# Patient Record
Sex: Male | Born: 1935 | Race: Black or African American | Hispanic: No | Marital: Single | Smoking: Former smoker
Health system: Southern US, Community
[De-identification: ages and names within clinical notes are randomized; demographics above are authoritative.]

## PROBLEM LIST (undated history)

## (undated) DIAGNOSIS — R296 Repeated falls: Secondary | ICD-10-CM

## (undated) DIAGNOSIS — R001 Bradycardia, unspecified: Secondary | ICD-10-CM

## (undated) DIAGNOSIS — R011 Cardiac murmur, unspecified: Secondary | ICD-10-CM

## (undated) DIAGNOSIS — K219 Gastro-esophageal reflux disease without esophagitis: Secondary | ICD-10-CM

## (undated) DIAGNOSIS — I509 Heart failure, unspecified: Secondary | ICD-10-CM

## (undated) DIAGNOSIS — H409 Unspecified glaucoma: Secondary | ICD-10-CM

## (undated) DIAGNOSIS — F32A Depression, unspecified: Secondary | ICD-10-CM

## (undated) DIAGNOSIS — G20 Parkinson's disease: Secondary | ICD-10-CM

## (undated) DIAGNOSIS — G5793 Unspecified mononeuropathy of bilateral lower limbs: Secondary | ICD-10-CM

## (undated) DIAGNOSIS — N320 Bladder-neck obstruction: Secondary | ICD-10-CM

## (undated) DIAGNOSIS — I2510 Atherosclerotic heart disease of native coronary artery without angina pectoris: Secondary | ICD-10-CM

## (undated) DIAGNOSIS — I429 Cardiomyopathy, unspecified: Secondary | ICD-10-CM

## (undated) DIAGNOSIS — I700 Atherosclerosis of aorta: Secondary | ICD-10-CM

## (undated) DIAGNOSIS — M1990 Unspecified osteoarthritis, unspecified site: Secondary | ICD-10-CM

## (undated) DIAGNOSIS — E785 Hyperlipidemia, unspecified: Secondary | ICD-10-CM

## (undated) DIAGNOSIS — I714 Abdominal aortic aneurysm, without rupture, unspecified: Secondary | ICD-10-CM

## (undated) DIAGNOSIS — I4891 Unspecified atrial fibrillation: Secondary | ICD-10-CM

## (undated) DIAGNOSIS — I10 Essential (primary) hypertension: Secondary | ICD-10-CM

## (undated) DIAGNOSIS — N400 Enlarged prostate without lower urinary tract symptoms: Secondary | ICD-10-CM

## (undated) DIAGNOSIS — I219 Acute myocardial infarction, unspecified: Secondary | ICD-10-CM

## (undated) DIAGNOSIS — I723 Aneurysm of iliac artery: Secondary | ICD-10-CM

## (undated) DIAGNOSIS — G20A1 Parkinson's disease without dyskinesia, without mention of fluctuations: Secondary | ICD-10-CM

## (undated) DIAGNOSIS — G629 Polyneuropathy, unspecified: Secondary | ICD-10-CM

## (undated) DIAGNOSIS — K861 Other chronic pancreatitis: Secondary | ICD-10-CM

## (undated) DIAGNOSIS — G2581 Restless legs syndrome: Secondary | ICD-10-CM

## (undated) DIAGNOSIS — Z7901 Long term (current) use of anticoagulants: Secondary | ICD-10-CM

## (undated) DIAGNOSIS — Z972 Presence of dental prosthetic device (complete) (partial): Secondary | ICD-10-CM

## (undated) HISTORY — PX: CORONARY ANGIOPLASTY: SHX604

## (undated) HISTORY — PX: OTHER SURGICAL HISTORY: SHX169

---

## 2003-11-16 HISTORY — PX: CORONARY ANGIOPLASTY WITH STENT PLACEMENT: SHX49

## 2003-11-19 ENCOUNTER — Other Ambulatory Visit: Payer: Self-pay

## 2003-11-19 DIAGNOSIS — I2119 ST elevation (STEMI) myocardial infarction involving other coronary artery of inferior wall: Secondary | ICD-10-CM

## 2003-11-19 HISTORY — DX: ST elevation (STEMI) myocardial infarction involving other coronary artery of inferior wall: I21.19

## 2003-11-20 ENCOUNTER — Other Ambulatory Visit: Payer: Self-pay

## 2004-02-12 ENCOUNTER — Encounter: Payer: Self-pay | Admitting: Internal Medicine

## 2004-07-26 ENCOUNTER — Ambulatory Visit: Payer: Self-pay | Admitting: Gastroenterology

## 2004-09-08 ENCOUNTER — Ambulatory Visit: Payer: Self-pay | Admitting: Gastroenterology

## 2004-11-23 ENCOUNTER — Emergency Department: Payer: Self-pay | Admitting: Emergency Medicine

## 2004-11-23 ENCOUNTER — Other Ambulatory Visit: Payer: Self-pay

## 2004-12-13 ENCOUNTER — Ambulatory Visit: Payer: Self-pay | Admitting: Nurse Practitioner

## 2005-03-13 ENCOUNTER — Ambulatory Visit: Payer: Self-pay | Admitting: Unknown Physician Specialty

## 2007-05-02 DIAGNOSIS — I219 Acute myocardial infarction, unspecified: Secondary | ICD-10-CM

## 2007-05-02 HISTORY — DX: Acute myocardial infarction, unspecified: I21.9

## 2007-05-02 HISTORY — PX: CORONARY ANGIOPLASTY WITH STENT PLACEMENT: SHX49

## 2007-05-21 ENCOUNTER — Encounter: Payer: Self-pay | Admitting: Internal Medicine

## 2007-06-15 ENCOUNTER — Encounter: Payer: Self-pay | Admitting: Internal Medicine

## 2007-07-13 ENCOUNTER — Encounter: Payer: Self-pay | Admitting: Internal Medicine

## 2007-07-28 ENCOUNTER — Ambulatory Visit: Payer: Self-pay | Admitting: Family Medicine

## 2007-08-13 ENCOUNTER — Encounter: Payer: Self-pay | Admitting: Internal Medicine

## 2008-07-30 ENCOUNTER — Emergency Department: Payer: Self-pay | Admitting: Emergency Medicine

## 2008-08-12 ENCOUNTER — Ambulatory Visit: Payer: Self-pay | Admitting: Unknown Physician Specialty

## 2009-02-01 ENCOUNTER — Inpatient Hospital Stay: Payer: Self-pay | Admitting: Specialist

## 2009-02-02 HISTORY — PX: LEFT HEART CATH AND CORONARY ANGIOGRAPHY: CATH118249

## 2009-11-17 ENCOUNTER — Ambulatory Visit: Payer: Self-pay | Admitting: Gastroenterology

## 2010-12-19 ENCOUNTER — Emergency Department: Payer: Self-pay | Admitting: Emergency Medicine

## 2011-10-24 ENCOUNTER — Emergency Department: Payer: Self-pay | Admitting: Emergency Medicine

## 2011-10-24 LAB — COMPREHENSIVE METABOLIC PANEL
Albumin: 4.2 g/dL (ref 3.4–5.0)
Albumin: 4.2 g/dL (ref 3.4–5.0)
Alkaline Phosphatase: 104 U/L (ref 50–136)
Alkaline Phosphatase: 99 U/L (ref 50–136)
Anion Gap: 5 — ABNORMAL LOW (ref 7–16)
Anion Gap: 7 (ref 7–16)
BUN: 18 mg/dL (ref 7–18)
BUN: 16 mg/dL (ref 7–18)
Bilirubin,Total: 2.1 mg/dL — ABNORMAL HIGH (ref 0.2–1.0)
Bilirubin,Total: 2.5 mg/dL — ABNORMAL HIGH (ref 0.2–1.0)
Calcium, Total: 9.4 mg/dL (ref 8.5–10.1)
Calcium, Total: 9.4 mg/dL (ref 8.5–10.1)
Chloride: 103 mmol/L (ref 98–107)
Chloride: 104 mmol/L (ref 98–107)
Co2: 30 mmol/L (ref 21–32)
Co2: 27 mmol/L (ref 21–32)
Creatinine: 0.93 mg/dL (ref 0.60–1.30)
Creatinine: 0.83 mg/dL (ref 0.60–1.30)
EGFR (African American): >60
EGFR (African American): >60
EGFR (Non-African Amer.): >60
EGFR (Non-African Amer.): >60
Glucose: 87 mg/dL (ref 65–99)
Glucose: 86 mg/dL (ref 65–99)
Osmolality: 277 (ref 275–301)
Osmolality: 276 (ref 275–301)
Potassium: 4.1 mmol/L (ref 3.5–5.1)
Potassium: 4 mmol/L (ref 3.5–5.1)
SGOT(AST): 23 U/L (ref 15–37)
SGOT(AST): 20 U/L (ref 15–37)
SGPT (ALT): 18 U/L
SGPT (ALT): 19 U/L
Sodium: 138 mmol/L (ref 136–145)
Sodium: 138 mmol/L (ref 136–145)
Total Protein: 7.6 g/dL (ref 6.4–8.2)
Total Protein: 7.5 g/dL (ref 6.4–8.2)

## 2011-10-24 LAB — URINALYSIS, COMPLETE
Bacteria: NEGATIVE
Bacteria: NONE SEEN
Bilirubin,UR: NEGATIVE
Bilirubin,UR: NEGATIVE
Blood: NEGATIVE
Blood: NEGATIVE
Glucose,UR: NEGATIVE mg/dL (ref 0–75)
Glucose,UR: NEGATIVE mg/dL (ref 0–75)
Ketone: NEGATIVE
Ketone: NEGATIVE
Leukocyte Esterase: NEGATIVE
Leukocyte Esterase: NEGATIVE
Nitrite: NEGATIVE
Nitrite: NEGATIVE
Ph: 7 (ref 4.5–8.0)
Ph: 5 (ref 4.5–8.0)
Protein: NEGATIVE
Protein: NEGATIVE
RBC,UR: NONE SEEN /HPF (ref 0–5)
RBC,UR: <1 /HPF (ref 0–5)
Specific Gravity: 1.005 (ref 1.003–1.030)
Specific Gravity: 1.003 (ref 1.003–1.030)
Squamous Epithelial: NONE SEEN
Squamous Epithelial: NONE SEEN
WBC UR: NONE SEEN /HPF (ref 0–5)
WBC UR: NONE SEEN /HPF (ref 0–5)

## 2011-10-24 LAB — CBC
HCT: 42.1 % (ref 40.0–52.0)
HGB: 14.5 g/dL (ref 13.0–18.0)
MCH: 31.9 pg (ref 26.0–34.0)
MCHC: 34.6 g/dL (ref 32.0–36.0)
MCV: 92 fL (ref 80–100)
Platelet: 175 10*3/uL (ref 150–440)
RBC: 4.56 10*6/uL (ref 4.40–5.90)
RDW: 13.4 % (ref 11.5–14.5)
WBC: 5.3 10*3/uL (ref 3.8–10.6)

## 2011-10-24 LAB — CBC WITH DIFFERENTIAL/PLATELET
Basophil #: 0 10*3/uL (ref 0.0–0.1)
Basophil %: 0.4 %
Eosinophil #: 0.4 10*3/uL (ref 0.0–0.7)
Eosinophil %: 5.6 %
HCT: 43.7 % (ref 40.0–52.0)
HGB: 14.7 g/dL (ref 13.0–18.0)
Lymphocyte #: 1.5 10*3/uL (ref 1.0–3.6)
Lymphocyte %: 23.3 %
MCH: 31.1 pg (ref 26.0–34.0)
MCHC: 33.6 g/dL (ref 32.0–36.0)
MCV: 93 fL (ref 80–100)
Monocyte #: 0.7 x10 3/mm (ref 0.2–1.0)
Monocyte %: 11.4 %
Neutrophil #: 3.8 10*3/uL (ref 1.4–6.5)
Neutrophil %: 59.3 %
Platelet: 181 10*3/uL (ref 150–440)
RBC: 4.72 10*6/uL (ref 4.40–5.90)
RDW: 13.5 % (ref 11.5–14.5)
WBC: 6.4 10*3/uL (ref 3.8–10.6)

## 2011-10-24 LAB — LIPASE, BLOOD
Lipase: 81 U/L (ref 73–393)
Lipase: 75 U/L (ref 73–393)

## 2012-07-01 ENCOUNTER — Encounter: Payer: Self-pay | Admitting: Nurse Practitioner

## 2012-07-12 ENCOUNTER — Encounter: Payer: Self-pay | Admitting: Nurse Practitioner

## 2012-08-12 ENCOUNTER — Encounter: Payer: Self-pay | Admitting: Nurse Practitioner

## 2014-01-26 DIAGNOSIS — R39198 Other difficulties with micturition: Secondary | ICD-10-CM | POA: Insufficient documentation

## 2014-01-26 DIAGNOSIS — R339 Retention of urine, unspecified: Secondary | ICD-10-CM | POA: Insufficient documentation

## 2014-01-26 DIAGNOSIS — R350 Frequency of micturition: Secondary | ICD-10-CM | POA: Insufficient documentation

## 2014-01-26 DIAGNOSIS — N401 Enlarged prostate with lower urinary tract symptoms: Secondary | ICD-10-CM

## 2014-01-26 DIAGNOSIS — N138 Other obstructive and reflux uropathy: Secondary | ICD-10-CM | POA: Insufficient documentation

## 2014-08-30 DIAGNOSIS — N529 Male erectile dysfunction, unspecified: Secondary | ICD-10-CM | POA: Insufficient documentation

## 2014-10-14 ENCOUNTER — Encounter: Payer: Self-pay | Admitting: *Deleted

## 2014-10-15 ENCOUNTER — Encounter: Payer: Self-pay | Admitting: *Deleted

## 2014-10-15 ENCOUNTER — Ambulatory Visit
Admission: RE | Admit: 2014-10-15 | Discharge: 2014-10-15 | Disposition: A | Discharge status: Home / Self Care | Payer: Medicare Other | Source: Ambulatory Visit | Attending: Gastroenterology | Admitting: Gastroenterology

## 2014-10-15 ENCOUNTER — Ambulatory Visit: Payer: Medicare Other | Admitting: Anesthesiology

## 2014-10-15 ENCOUNTER — Encounter
Admission: RE | Disposition: A | Discharge status: Home / Self Care | Payer: Self-pay | Source: Ambulatory Visit | Attending: Gastroenterology

## 2014-10-15 DIAGNOSIS — E785 Hyperlipidemia, unspecified: Secondary | ICD-10-CM | POA: Diagnosis not present

## 2014-10-15 DIAGNOSIS — I2510 Atherosclerotic heart disease of native coronary artery without angina pectoris: Secondary | ICD-10-CM | POA: Insufficient documentation

## 2014-10-15 DIAGNOSIS — K219 Gastro-esophageal reflux disease without esophagitis: Secondary | ICD-10-CM | POA: Diagnosis not present

## 2014-10-15 DIAGNOSIS — Z79899 Other long term (current) drug therapy: Secondary | ICD-10-CM | POA: Diagnosis not present

## 2014-10-15 DIAGNOSIS — I252 Old myocardial infarction: Secondary | ICD-10-CM | POA: Insufficient documentation

## 2014-10-15 DIAGNOSIS — K222 Esophageal obstruction: Secondary | ICD-10-CM | POA: Diagnosis not present

## 2014-10-15 DIAGNOSIS — Z7982 Long term (current) use of aspirin: Secondary | ICD-10-CM | POA: Diagnosis not present

## 2014-10-15 DIAGNOSIS — R1310 Dysphagia, unspecified: Secondary | ICD-10-CM | POA: Insufficient documentation

## 2014-10-15 DIAGNOSIS — K648 Other hemorrhoids: Secondary | ICD-10-CM | POA: Diagnosis not present

## 2014-10-15 DIAGNOSIS — I10 Essential (primary) hypertension: Secondary | ICD-10-CM | POA: Insufficient documentation

## 2014-10-15 DIAGNOSIS — H409 Unspecified glaucoma: Secondary | ICD-10-CM | POA: Insufficient documentation

## 2014-10-15 DIAGNOSIS — Z800 Family history of malignant neoplasm of digestive organs: Secondary | ICD-10-CM | POA: Diagnosis not present

## 2014-10-15 DIAGNOSIS — Z87891 Personal history of nicotine dependence: Secondary | ICD-10-CM | POA: Diagnosis not present

## 2014-10-15 DIAGNOSIS — K861 Other chronic pancreatitis: Secondary | ICD-10-CM | POA: Diagnosis not present

## 2014-10-15 DIAGNOSIS — K5730 Diverticulosis of large intestine without perforation or abscess without bleeding: Secondary | ICD-10-CM | POA: Diagnosis not present

## 2014-10-15 DIAGNOSIS — Z1211 Encounter for screening for malignant neoplasm of colon: Secondary | ICD-10-CM | POA: Diagnosis not present

## 2014-10-15 HISTORY — DX: Acute myocardial infarction, unspecified: I21.9

## 2014-10-15 HISTORY — DX: Hyperlipidemia, unspecified: E78.5

## 2014-10-15 HISTORY — PX: COLONOSCOPY: SHX5424

## 2014-10-15 HISTORY — DX: Atherosclerotic heart disease of native coronary artery without angina pectoris: I25.10

## 2014-10-15 HISTORY — DX: Bladder-neck obstruction: N32.0

## 2014-10-15 HISTORY — DX: Bradycardia, unspecified: R00.1

## 2014-10-15 HISTORY — DX: Essential (primary) hypertension: I10

## 2014-10-15 HISTORY — DX: Gastro-esophageal reflux disease without esophagitis: K21.9

## 2014-10-15 HISTORY — DX: Unspecified glaucoma: H40.9

## 2014-10-15 HISTORY — PX: ESOPHAGOGASTRODUODENOSCOPY (EGD) WITH PROPOFOL: SHX5813

## 2014-10-15 HISTORY — PX: SAVORY DILATION: SHX5439

## 2014-10-15 HISTORY — DX: Other chronic pancreatitis: K86.1

## 2014-10-15 SURGERY — COLONOSCOPY
Anesthesia: General

## 2014-10-15 MED ORDER — MIDAZOLAM HCL 2 MG/2ML IJ SOLN
INTRAMUSCULAR | Status: DC | PRN
  Administered 2014-10-15: 1 mg via INTRAVENOUS

## 2014-10-15 MED ORDER — PHENYLEPHRINE HCL 10 MG/ML IJ SOLN
INTRAMUSCULAR | Status: DC | PRN
  Administered 2014-10-15 (×3): 100 ug via INTRAVENOUS

## 2014-10-15 MED ORDER — FENTANYL CITRATE (PF) 100 MCG/2ML IJ SOLN
INTRAMUSCULAR | Status: DC | PRN
  Administered 2014-10-15: 50 ug via INTRAVENOUS

## 2014-10-15 MED ORDER — SODIUM CHLORIDE 0.9 % IV SOLN: INTRAVENOUS | Status: DC

## 2014-10-15 MED ORDER — PROPOFOL INFUSION 10 MG/ML OPTIME
INTRAVENOUS | Status: DC | PRN
  Administered 2014-10-15: 120 ug/kg/min via INTRAVENOUS

## 2014-10-15 MED ORDER — SODIUM CHLORIDE 0.9 % IV SOLN
INTRAVENOUS | Status: DC
  Administered 2014-10-15: 13:00:00 via INTRAVENOUS

## 2014-10-15 MED ORDER — PROPOFOL 10 MG/ML IV BOLUS
INTRAVENOUS | Status: DC | PRN
  Administered 2014-10-15: 50 mg via INTRAVENOUS

## 2014-10-15 MED ORDER — GLYCOPYRROLATE 0.2 MG/ML IJ SOLN
INTRAMUSCULAR | Status: DC | PRN
  Administered 2014-10-15: 0.2 mg via INTRAVENOUS
  Administered 2014-10-15: 900 mg via INTRAVENOUS

## 2014-10-15 MED ORDER — LIDOCAINE HCL (CARDIAC) 20 MG/ML IV SOLN
INTRAVENOUS | Status: DC | PRN
  Administered 2014-10-15: 100 mg via INTRAVENOUS

## 2014-10-15 NOTE — Op Note (Signed)
[facility] Gastroenterology Patient Name: Scott Cortez Procedure Date: 10/15/2014 2:07 PM MRN: [id number] Account #: [id number] Date of Birth: [birth year] Admit Type: Outpatient Age: 81 Room: [facility] ENDO ROOM 4 Gender: Male Note Status: Finalized Procedure:         Colonoscopy Indications:       Screening in patient at increased risk: Family history of                     1st-degree relative with colorectal cancer Providers:         Scott Jun, MD Medicines:         Propofol per Anesthesia Complications:     No immediate complications. Procedure:         Pre-Anesthesia Assessment:                    - After reviewing the risks and benefits, the patient was                     deemed in satisfactory condition to undergo the procedure.                    After obtaining informed consent, the colonoscope was                     passed under direct vision. Throughout the procedure, the                     patient's blood pressure, pulse, and oxygen saturations                     were monitored continuously. The Olympus PCF-160AL                     Colonoscope (S# [id number]) was introduced through the anus                     and advanced to the the cecum, identified by appendiceal                     orifice and ileocecal valve. The colonoscopy was performed                     without difficulty. The patient tolerated the procedure                     well. The quality of the bowel preparation was excellent. Findings:      Multiple small-mouthed diverticula were found in the sigmoid colon and       in the descending colon.      Internal hemorrhoids were found during endoscopy. The hemorrhoids were       small and Grade I (internal hemorrhoids that do not prolapse).      The exam was otherwise without abnormality. Impression:        - Diverticulosis in the sigmoid colon and in the                     descending colon.                    - Internal  hemorrhoids.                    - The examination was otherwise normal.                    -  No specimens collected. Recommendation:    - The findings and recommendations were discussed with the                     patient's family. No routine colonoscopy recommended. Scott Junobert T Scott Strubel, MD 10/15/2014 2:50:05 PM This report has been signed electronically. Number of Addenda: 0 Note Initiated On: 10/15/2014 2:07 PM Scope Withdrawal Time: 0 hours 6 minutes 50 seconds  Total Procedure Duration: 0 hours 14 minutes 39 seconds       [facility]

## 2014-10-15 NOTE — Anesthesia Postprocedure Evaluation (Signed)
  Anesthesia Post-op Note  Patient: Scott Cortez  Procedure(s) Performed: Procedure(s): COLONOSCOPY (N/A) ESOPHAGOGASTRODUODENOSCOPY (EGD) WITH PROPOFOL (N/A) SAVORY DILATION (N/A)  Anesthesia type:General  Patient location: PACU  Post pain: Pain level controlled  Post assessment: Post-op Vital signs reviewed, Patient's Cardiovascular Status Stable, Respiratory Function Stable, Patent Airway and No signs of Nausea or vomiting  Post vital signs: Reviewed and stable  Last Vitals:  Filed Vitals:   10/15/14 1307  BP: 131/67  Pulse: 46  Temp: 36.3 C  Resp: 18    Level of consciousness: awake, alert  and patient cooperative  Complications: No apparent anesthesia complications

## 2014-10-15 NOTE — Op Note (Signed)
[facility] Gastroenterology Patient Name: Scott KindleLawrence Cortez Procedure Date: 10/15/2014 2:08 PM MRN: [id number] Account #: [id number] Date of Birth: 01/16/1934 Admit Type: Outpatient Age: [age] Room: [facility] ENDO ROOM 4 Gender: Male Note Status: Finalized Procedure:         Upper GI endoscopy Indications:       Dysphagia Providers:         Scot Junobert T. Lachlan Pelto, MD Medicines:         Propofol per Anesthesia Complications:     No immediate complications. Procedure:         Pre-Anesthesia Assessment:                    - After reviewing the risks and benefits, the patient was                     deemed in satisfactory condition to undergo the procedure.                    After obtaining informed consent, the endoscope was passed                     under direct vision. Throughout the procedure, the                     patient's blood pressure, pulse, and oxygen saturations                     were monitored continuously. The Endoscope was introduced                     through the mouth, and advanced to the second part of                     duodenum. The upper GI endoscopy was accomplished without                     difficulty. The patient tolerated the procedure well. Findings:      A mild Schatzki ring (acquired) was found at the gastroesophageal       junction. A guidewire was placed and the scope was withdrawn. Dilation       was performed with a Savary dilator with mild resistance at 17 mm.      The stomach was normal.      The examined duodenum was normal. Impression:        - Mild Schatzki ring. Dilated.                    - Normal stomach.                    - Normal examined duodenum.                    - No specimens collected. Recommendation:    - Perform a colonoscopy as previously scheduled. Scot Junobert T [name], MD 10/15/2014 2:24:03 PM This report has been signed electronically. Number of Addenda: 0 Note Initiated On: 10/15/2014 2:08 PM      [facility]
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
## 2014-10-15 NOTE — H&P (Signed)
Primary Care Physician:  Jefm Bryant, MD Primary Gastroenterologist:  Dr. Mechele Collin  Pre-Procedure History & Physical: HPI:  JAKOBIE HENSLEE is a 81 y.o. male is here for an endoscopy and colonoscopy.   Past Medical History  Diagnosis Date  . Coronary artery disease   . Myocardial infarction   . GERD (gastroesophageal reflux disease)   . Bladder neck obstruction   . Chronic pancreatitis   . Hypertension   . Hyperlipidemia   . Bradycardia   . Glaucoma     Past Surgical History  Procedure Laterality Date  . Coronary stenting      Prior to Admission medications   Medication Sig Start Date End Date Taking? Authorizing Provider  amLODipine (NORVASC) 5 MG tablet Take 5 mg by mouth daily.   Yes Historical Provider, MD  aspirin 325 MG tablet Take 325 mg by mouth daily.   Yes Historical Provider, MD  atorvastatin (LIPITOR) 40 MG tablet Take 40 mg by mouth daily.   Yes Historical Provider, MD  brimonidine (ALPHAGAN) 0.2 % ophthalmic solution Place 1 drop into both eyes 2 (two) times daily.   Yes Historical Provider, MD  clopidogrel (PLAVIX) 75 MG tablet Take 75 mg by mouth daily.   Yes Historical Provider, MD  latanoprost (XALATAN) 0.005 % ophthalmic solution Place 1 drop into both eyes every morning.   Yes Historical Provider, MD  losartan (COZAAR) 50 MG tablet Take 50 mg by mouth daily.   Yes Historical Provider, MD  ranitidine (ZANTAC) 150 MG capsule Take 150 mg by mouth daily.   Yes Historical Provider, MD  sildenafil (VIAGRA) 100 MG tablet Take 100 mg by mouth daily as needed for erectile dysfunction.   Yes Historical Provider, MD  tamsulosin (FLOMAX) 0.4 MG CAPS capsule Take 0.4 mg by mouth daily.   Yes Historical Provider, MD  timolol (BETIMOL) 0.5 % ophthalmic solution Place 1 drop into both eyes 2 (two) times daily.   Yes Historical Provider, MD    NKDA  History reviewed. No pertinent family history.  History   Social History  . Marital Status: Single   Spouse Name: N/A  . Number of Children: N/A  . Years of Education: N/A   Occupational History  . Not on file.   Social History Main Topics  . Smoking status: Former Games developer  . Smokeless tobacco: Not on file  . Alcohol Use: No  . Drug Use: Yes    Special: Marijuana  . Sexual Activity: Not on file   Other Topics Concern  . Not on file   Social History Narrative    Review of Systems: See HPI, otherwise negative ROS  Physical Exam: BP 131/67 mmHg  Pulse 46  Temp(Src) 97.3 F (36.3 C) (Tympanic)  Resp 18  Ht  (1.753 m)  Wt 88.905 kg (196 lb)  BMI 28.93 kg/m2  SpO2 97% General:   Alert,  pleasant and cooperative in NAD Head:  Normocephalic and atraumatic. Neck:  Supple; no masses or thyromegaly. Lungs:  Clear throughout to auscultation.  However there is exp wheezing on the right, he is aware of this problem  Heart:  Regular rate and rhythm. Abdomen:  Soft, nontender and nondistended. Normal bowel sounds, without guarding, and without rebound.   Neurologic:  Alert and  oriented x4;  grossly normal neurologically.  Impression/Plan: Scott Cortez is here for an endoscopy and colonoscopy to be performed for Family history of colon cancer and dysphagia.  Risks, benefits, limitations, and alternatives regarding  endoscopy and colonoscopy have been reviewed with the patient.  Questions have been answered.  All parties agreeable.   Lynnae PrudeELLIOTT, Pearl Berlinger, MD  10/15/2014, 2:01 PM

## 2014-10-15 NOTE — Transfer of Care (Signed)
Immediate Anesthesia Transfer of Care Note  Patient: Scott GourdLawrence J Barrick  Procedure(s) Performed: Procedure(s): COLONOSCOPY (N/A) ESOPHAGOGASTRODUODENOSCOPY (EGD) WITH PROPOFOL (N/A) SAVORY DILATION (N/A)  Patient Location: PACU  Anesthesia Type:General  Level of Consciousness: sedated  Airway & Oxygen Therapy: Patient Spontanous Breathing and Patient connected to nasal cannula oxygen  Post-op Assessment: Report given to RN and Post -op Vital signs reviewed and stable  Post vital signs: Reviewed and stable  Last Vitals:  Filed Vitals:   10/15/14 1307  BP: 131/67  Pulse: 46  Temp: 36.3 C  Resp: 18    Complications: No apparent anesthesia complications

## 2014-10-20 ENCOUNTER — Encounter: Payer: Self-pay | Admitting: Unknown Physician Specialty

## 2015-09-04 DIAGNOSIS — R1030 Lower abdominal pain, unspecified: Secondary | ICD-10-CM | POA: Insufficient documentation

## 2015-12-31 ENCOUNTER — Encounter: Payer: Self-pay | Admitting: Emergency Medicine

## 2015-12-31 ENCOUNTER — Emergency Department: Payer: Medicare Other

## 2015-12-31 ENCOUNTER — Emergency Department
Admission: EM | Admit: 2015-12-31 | Discharge: 2016-01-01 | Disposition: A | Discharge status: Home / Self Care | Payer: Medicare Other | Attending: Emergency Medicine | Admitting: Emergency Medicine

## 2015-12-31 DIAGNOSIS — I10 Essential (primary) hypertension: Secondary | ICD-10-CM | POA: Diagnosis not present

## 2015-12-31 DIAGNOSIS — Z87891 Personal history of nicotine dependence: Secondary | ICD-10-CM | POA: Insufficient documentation

## 2015-12-31 DIAGNOSIS — I2510 Atherosclerotic heart disease of native coronary artery without angina pectoris: Secondary | ICD-10-CM | POA: Diagnosis not present

## 2015-12-31 DIAGNOSIS — R1030 Lower abdominal pain, unspecified: Secondary | ICD-10-CM | POA: Diagnosis present

## 2015-12-31 DIAGNOSIS — K5732 Diverticulitis of large intestine without perforation or abscess without bleeding: Secondary | ICD-10-CM | POA: Diagnosis not present

## 2015-12-31 HISTORY — DX: Heart failure, unspecified: I50.9

## 2015-12-31 LAB — COMPREHENSIVE METABOLIC PANEL
ALT: 15 U/L — ABNORMAL LOW (ref 17–63)
AST: 18 U/L (ref 15–41)
Albumin: 4.2 g/dL (ref 3.5–5.0)
Alkaline Phosphatase: 82 U/L (ref 38–126)
Anion gap: 7 (ref 5–15)
BUN: 21 mg/dL — ABNORMAL HIGH (ref 6–20)
CO2: 26 mmol/L (ref 22–32)
Calcium: 9.2 mg/dL (ref 8.9–10.3)
Chloride: 101 mmol/L (ref 101–111)
Creatinine, Ser: 0.95 mg/dL (ref 0.61–1.24)
GFR calc Af Amer: >60 mL/min (ref >60)
GFR calc non Af Amer: >60 mL/min (ref >60)
Glucose, Bld: 108 mg/dL — ABNORMAL HIGH (ref 65–99)
Potassium: 3.8 mmol/L (ref 3.5–5.1)
Sodium: 134 mmol/L — ABNORMAL LOW (ref 135–145)
Total Bilirubin: 3.1 mg/dL — ABNORMAL HIGH (ref 0.3–1.2)
Total Protein: 7.4 g/dL (ref 6.5–8.1)

## 2015-12-31 LAB — URINALYSIS COMPLETE WITH MICROSCOPIC (ARMC ONLY)
Bacteria, UA: NONE SEEN
Bilirubin Urine: NEGATIVE
Glucose, UA: NEGATIVE mg/dL
Leukocytes, UA: NEGATIVE
Nitrite: NEGATIVE
Protein, ur: NEGATIVE mg/dL
Specific Gravity, Urine: 1.04 — ABNORMAL HIGH (ref 1.005–1.030)
Squamous Epithelial / LPF: NONE SEEN
pH: 5 (ref 5.0–8.0)

## 2015-12-31 LAB — CBC
HCT: 40.3 % (ref 40.0–52.0)
Hemoglobin: 14.1 g/dL (ref 13.0–18.0)
MCH: 31.6 pg (ref 26.0–34.0)
MCHC: 35 g/dL (ref 32.0–36.0)
MCV: 90.2 fL (ref 80.0–100.0)
Platelets: 160 10*3/uL (ref 150–440)
RBC: 4.46 MIL/uL (ref 4.40–5.90)
RDW: 13.7 % (ref 11.5–14.5)
WBC: 11.2 10*3/uL — ABNORMAL HIGH (ref 3.8–10.6)

## 2015-12-31 LAB — LIPASE, BLOOD: Lipase: 19 U/L (ref 11–51)

## 2015-12-31 MED ORDER — IOPAMIDOL (ISOVUE-370) INJECTION 76%
100.0000 mL | Freq: Once | INTRAVENOUS | Status: AC | PRN
  Administered 2015-12-31: 100 mL via INTRAVENOUS

## 2015-12-31 MED ORDER — CIPROFLOXACIN HCL 500 MG PO TABS
500.0000 mg | ORAL_TABLET | Freq: Once | ORAL | Status: AC
  Administered 2016-01-01: 500 mg via ORAL
  Filled 2015-12-31: qty 1

## 2015-12-31 MED ORDER — METRONIDAZOLE 500 MG PO TABS
500.0000 mg | ORAL_TABLET | Freq: Three times a day (TID) | ORAL | 0 refills | Status: AC
Start: 2015-12-31 — End: 2016-01-10

## 2015-12-31 MED ORDER — CIPROFLOXACIN HCL 500 MG PO TABS: 500.0000 mg | ORAL_TABLET | Freq: Two times a day (BID) | ORAL | 0 refills | Status: AC

## 2015-12-31 MED ORDER — METRONIDAZOLE 500 MG PO TABS
500.0000 mg | ORAL_TABLET | Freq: Once | ORAL | Status: AC
  Administered 2016-01-01: 500 mg via ORAL
  Filled 2015-12-31: qty 1

## 2015-12-31 NOTE — ED Provider Notes (Signed)
[facility] Emergency Department Provider Note   ____________________________________________   I have reviewed the triage vital signs and the nursing notes.   HISTORY  Chief Complaint Abdominal Pain (Pt. states below navel traverse abdominal pain)   History limited by: Not Limited   HPI Scott Cortez is a 82 y.o. male who presents to the emergency department today because of concerns for lower abdominal pain. It started today and has been present throughout the day. It is located below his navel and radiates into his back. He has had somewhat similar pain in the past although today's bit different. He does have a history of abdominal aorta aneurysm however they think that it was less than 3 cm. This was discovered roughly 6 months ago. The patient has also had some issues with bowel movements recently although states he has had 2 small bowel movements within the past week. Patient denies any fevers. No nausea or vomiting.   Past Medical History:  Diagnosis Date  . Bladder neck obstruction   . Bradycardia   . Chronic pancreatitis (HCC)   . Coronary artery disease   . GERD (gastroesophageal reflux disease)   . Glaucoma   . Hyperlipidemia   . Hypertension   . Myocardial infarction (HCC)     There are no active problems to display for this patient.   Past Surgical History:  Procedure Laterality Date  . COLONOSCOPY N/A 10/15/2014   Procedure: COLONOSCOPY;  Surgeon: Scot Junobert T Elliott, MD;  [facility];  Service: Endoscopy;  Laterality: N/A;  . coronary stenting    . ESOPHAGOGASTRODUODENOSCOPY (EGD) WITH PROPOFOL N/A 10/15/2014   Procedure: ESOPHAGOGASTRODUODENOSCOPY (EGD) WITH PROPOFOL;  Surgeon: Scot Junobert T Elliott, MD;  [facility];  Service: Endoscopy;  Laterality: N/A;  . SAVORY DILATION N/A 10/15/2014   Procedure: SAVORY DILATION;  Surgeon: Scot Junobert T Elliott, MD;  [facility];  Service: Endoscopy;  Laterality: N/A;     Prior to Admission medications   Medication Sig Start Date End Date Taking? Authorizing Provider  amLODipine (NORVASC) 5 MG tablet Take 5 mg by mouth daily.    Historical Provider, MD  aspirin 325 MG tablet Take 325 mg by mouth daily.    Historical Provider, MD  atorvastatin (LIPITOR) 40 MG tablet Take 40 mg by mouth daily.    Historical Provider, MD  brimonidine (ALPHAGAN) 0.2 % ophthalmic solution Place 1 drop into both eyes 2 (two) times daily.    Historical Provider, MD  clopidogrel (PLAVIX) 75 MG tablet Take 75 mg by mouth daily.    Historical Provider, MD  latanoprost (XALATAN) 0.005 % ophthalmic solution Place 1 drop into both eyes every morning.    Historical Provider, MD  losartan (COZAAR) 50 MG tablet Take 50 mg by mouth daily.    Historical Provider, MD  ranitidine (ZANTAC) 150 MG capsule Take 150 mg by mouth daily.    Historical Provider, MD  sildenafil (VIAGRA) 100 MG tablet Take 100 mg by mouth daily as needed for erectile dysfunction.    Historical Provider, MD  tamsulosin (FLOMAX) 0.4 MG CAPS capsule Take 0.4 mg by mouth daily.    Historical Provider, MD  timolol (BETIMOL) 0.5 % ophthalmic solution Place 1 drop into both eyes 2 (two) times daily.    Historical Provider, MD    Allergies Review of patient's allergies indicates no known allergies.  History reviewed. No pertinent family history.  Social History Social History  Substance Use Topics  . Smoking status: Former Games developermoker  .  Smokeless tobacco: Not on file  . Alcohol use No    Review of Systems  Constitutional: Negative for fever. Cardiovascular: Negative for chest pain. Respiratory: Negative for shortness of breath. Gastrointestinal:Positive for lower abdominal pain Genitourinary: Negative for dysuria. Musculoskeletal: Negative for back pain. Skin: Negative for rash. Neurological: Negative for headaches, focal weakness or numbness.  10-point ROS otherwise  negative.  ____________________________________________   PHYSICAL EXAM:  VITAL SIGNS: ED Triage Vitals  Enc Vitals Group     BP 12/31/15 2159 125/75     Pulse Rate 12/31/15 2159 63     Resp 12/31/15 2159 (!) 22     Temp 12/31/15 2159 98.6 F (37 C)     Temp Source 12/31/15 2159 Oral     SpO2 12/31/15 2159 93 %     Weight 12/31/15 2159 196 lb (88.9 kg)     Height 12/31/15 2159 5\' 9"  (1.753 m)     Head Circumference --      Peak Flow --      Pain Score 12/31/15 2200 7   Constitutional: Alert and oriented. Well appearing and in no distress. Eyes: Conjunctivae are normal. PERRL. Normal extraocular movements. ENT   Head: Normocephalic and atraumatic.   Nose: No congestion/rhinnorhea.   Mouth/Throat: Mucous membranes are moist.   Neck: No stridor. Hematological/Lymphatic/Immunilogical: No cervical lymphadenopathy. Cardiovascular: Normal rate, regular rhythm.  No murmurs, rubs, or gallops. Respiratory: Normal respiratory effort without tachypnea nor retractions. Breath sounds are clear and equal bilaterally. No wheezes/rales/rhonchi. Gastrointestinal: Soft and Tender to palpation of lower abdomen. Worse on the left side.. No distention. There is no CVA tenderness. Genitourinary: Deferred Musculoskeletal: Normal range of motion in all extremities. No joint effusions.  No lower extremity tenderness nor edema. Neurologic:  Normal speech and language. No gross focal neurologic deficits are appreciated.  Skin:  Skin is warm, dry and intact. No rash noted. Psychiatric: Mood and affect are normal. Speech and behavior are normal. Patient exhibits appropriate insight and judgment.  ____________________________________________    LABS (pertinent positives/negatives)  Labs Reviewed  COMPREHENSIVE METABOLIC PANEL - Abnormal; Notable for the following:       Result Value   Sodium 134 (*)    Glucose, Bld 108 (*)    BUN 21 (*)    ALT 15 (*)    Total Bilirubin 3.1 (*)     All other components within normal limits  CBC - Abnormal; Notable for the following:    WBC 11.2 (*)    All other components within normal limits  URINALYSIS COMPLETEWITH MICROSCOPIC ([facility] ONLY) - Abnormal; Notable for the following:    Color, Urine YELLOW (*)    APPearance CLEAR (*)    Ketones, ur 1+ (*)    Specific Gravity, Urine 1.040 (*)    Hgb urine dipstick 1+ (*)    All other components within normal limits  LIPASE, BLOOD     ____________________________________________   EKG  None  ____________________________________________    RADIOLOGY CT angiogram IMPRESSION:  Non Vascular    Sigmoid diverticulitis without fluid collection or pneumoperitoneum.    Vascular    1. No acute finding.  2. Aortic Atherosclerosis (ICD10-170.0). Diffuse branch vessel  atherosclerosis as described.  3. **An incidental finding of potential clinical significance has  been found. 3.1 cm abdominal aortic aneurysm. 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 Recommend followup by ultrasound in 3 years.  This recommendation follows ACR consensus guidelines: White Paper of  the ACR Incidental Findings Committee II on  Vascular Findings. J Am  Coll Radiol 2013; 10:789-794**    ____________________________________________   PROCEDURES  Procedures  ____________________________________________   INITIAL IMPRESSION / ASSESSMENT AND PLAN / ED COURSE  Pertinent labs & imaging results that were available during my care of the patient were reviewed by me and considered in my medical decision making (see chart for details).  Patient presented to the emergency department today because of concerns for lower abdominal pain. Patient does have history of AAA. Additionally patient was tender palpation in the left lower side on exam. Will plan on getting a CT angiogram to further evaluate  Clinical Course   Patient CT did show an abdominal aneurysm however no concerning rupture. It did  show diverticulitis. I think this likely explains the patient's symptoms. Will give patient does have antibiotics here in the emergency department and discharge home.  ____________________________________________   FINAL CLINICAL IMPRESSION(S) / ED DIAGNOSES  Final diagnoses:  Diverticulitis of large intestine without perforation or abscess without bleeding     Note: This dictation was prepared with Dragon dictation. Any transcriptional errors that result from this process are unintentional    Phineas SemenGraydon Yvonnia Tango, MD 12/31/15 2357

## 2015-12-31 NOTE — Discharge Instructions (Signed)
Please seek medical attention for any high fevers, chest pain, shortness of breath, change in behavior, persistent vomiting, bloody stool or any other new or concerning symptoms.  

## 2015-12-31 NOTE — ED Triage Notes (Signed)
Pt. States lower navel traverse abdominal pain. Pt. Denies nausea or vomiting.  Pt. States he has not had bowel movement in 2-3 days. Pt. States tonight he felt chilled and then became warm.  Pt. Also C/o of chronic leg and back pain.

## 2015-12-31 NOTE — ED Notes (Signed)
Pt attempting to give urine sample at this time.

## 2016-01-01 DIAGNOSIS — K5732 Diverticulitis of large intestine without perforation or abscess without bleeding: Secondary | ICD-10-CM | POA: Diagnosis not present

## 2016-01-03 ENCOUNTER — Telehealth: Payer: Self-pay | Admitting: Emergency Medicine

## 2016-01-09 ENCOUNTER — Emergency Department
Admission: EM | Admit: 2016-01-09 | Discharge: 2016-01-09 | Disposition: A | Discharge status: Home / Self Care | Payer: Medicare Other | Attending: Emergency Medicine | Admitting: Emergency Medicine

## 2016-01-09 ENCOUNTER — Emergency Department: Payer: Medicare Other

## 2016-01-09 ENCOUNTER — Encounter: Payer: Self-pay | Admitting: Emergency Medicine

## 2016-01-09 DIAGNOSIS — I2510 Atherosclerotic heart disease of native coronary artery without angina pectoris: Secondary | ICD-10-CM | POA: Diagnosis not present

## 2016-01-09 DIAGNOSIS — R58 Hemorrhage, not elsewhere classified: Secondary | ICD-10-CM | POA: Insufficient documentation

## 2016-01-09 DIAGNOSIS — R0781 Pleurodynia: Secondary | ICD-10-CM | POA: Insufficient documentation

## 2016-01-09 DIAGNOSIS — Z041 Encounter for examination and observation following transport accident: Secondary | ICD-10-CM | POA: Diagnosis present

## 2016-01-09 DIAGNOSIS — I110 Hypertensive heart disease with heart failure: Secondary | ICD-10-CM | POA: Diagnosis not present

## 2016-01-09 DIAGNOSIS — I509 Heart failure, unspecified: Secondary | ICD-10-CM | POA: Insufficient documentation

## 2016-01-09 DIAGNOSIS — S0990XA Unspecified injury of head, initial encounter: Secondary | ICD-10-CM | POA: Diagnosis not present

## 2016-01-09 DIAGNOSIS — Y999 Unspecified external cause status: Secondary | ICD-10-CM | POA: Diagnosis not present

## 2016-01-09 DIAGNOSIS — Z87891 Personal history of nicotine dependence: Secondary | ICD-10-CM | POA: Insufficient documentation

## 2016-01-09 DIAGNOSIS — R109 Unspecified abdominal pain: Secondary | ICD-10-CM | POA: Insufficient documentation

## 2016-01-09 DIAGNOSIS — Y92410 Unspecified street and highway as the place of occurrence of the external cause: Secondary | ICD-10-CM | POA: Diagnosis not present

## 2016-01-09 DIAGNOSIS — Y9389 Activity, other specified: Secondary | ICD-10-CM | POA: Insufficient documentation

## 2016-01-09 LAB — BASIC METABOLIC PANEL
Anion gap: 9 (ref 5–15)
BUN: 11 mg/dL (ref 6–20)
CO2: 24 mmol/L (ref 22–32)
Calcium: 9.3 mg/dL (ref 8.9–10.3)
Chloride: 101 mmol/L (ref 101–111)
Creatinine, Ser: 0.96 mg/dL (ref 0.61–1.24)
GFR calc Af Amer: >60 mL/min (ref >60)
GFR calc non Af Amer: >60 mL/min (ref >60)
Glucose, Bld: 107 mg/dL — ABNORMAL HIGH (ref 65–99)
Potassium: 4.1 mmol/L (ref 3.5–5.1)
Sodium: 134 mmol/L — ABNORMAL LOW (ref 135–145)

## 2016-01-09 LAB — CBC WITH DIFFERENTIAL/PLATELET
Basophils Absolute: 0 10*3/uL (ref 0–0.1)
Basophils Relative: 1 %
Eosinophils Absolute: 0.1 10*3/uL (ref 0–0.7)
Eosinophils Relative: 2 %
HCT: 42.3 % (ref 40.0–52.0)
Hemoglobin: 15.1 g/dL (ref 13.0–18.0)
Lymphocytes Relative: 12 %
Lymphs Abs: 0.8 10*3/uL — ABNORMAL LOW (ref 1.0–3.6)
MCH: 32.2 pg (ref 26.0–34.0)
MCHC: 35.7 g/dL (ref 32.0–36.0)
MCV: 90.2 fL (ref 80.0–100.0)
Monocytes Absolute: 0.6 10*3/uL (ref 0.2–1.0)
Monocytes Relative: 10 %
Neutro Abs: 5.2 10*3/uL (ref 1.4–6.5)
Neutrophils Relative %: 77 %
Platelets: 215 10*3/uL (ref 150–440)
RBC: 4.69 MIL/uL (ref 4.40–5.90)
RDW: 13.4 % (ref 11.5–14.5)
WBC: 6.7 10*3/uL (ref 3.8–10.6)

## 2016-01-09 MED ORDER — OXYCODONE-ACETAMINOPHEN 5-325 MG PO TABS
1.0000 | ORAL_TABLET | Freq: Once | ORAL | Status: AC
Start: 2016-01-09 — End: 2016-01-09
  Administered 2016-01-09: 1 via ORAL
  Filled 2016-01-09: qty 1

## 2016-01-09 MED ORDER — IOPAMIDOL (ISOVUE-300) INJECTION 61%
100.0000 mL | Freq: Once | INTRAVENOUS | Status: AC | PRN
  Administered 2016-01-09: 100 mL via INTRAVENOUS

## 2016-01-09 MED ORDER — TRAMADOL HCL 50 MG PO TABS: 50.0000 mg | ORAL_TABLET | Freq: Four times a day (QID) | ORAL | 0 refills | Status: AC | PRN

## 2016-01-09 NOTE — ED Notes (Signed)
Patient transported to CT 
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
## 2016-01-09 NOTE — Discharge Instructions (Signed)
Please seek medical attention for any high fevers, chest pain, shortness of breath, change in behavior, persistent vomiting, bloody stool or any other new or concerning symptoms.  
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
## 2016-06-15 ENCOUNTER — Ambulatory Visit
Admission: RE | Admit: 2016-06-15 | Discharge: 2016-06-15 | Disposition: A | Discharge status: Home / Self Care | Payer: Medicare Other | Source: Ambulatory Visit | Attending: Nurse Practitioner | Admitting: Nurse Practitioner

## 2016-06-15 ENCOUNTER — Other Ambulatory Visit: Payer: Self-pay | Admitting: Nurse Practitioner

## 2016-06-15 DIAGNOSIS — R059 Cough, unspecified: Secondary | ICD-10-CM

## 2016-06-15 DIAGNOSIS — R05 Cough: Secondary | ICD-10-CM | POA: Diagnosis not present

## 2016-06-26 DIAGNOSIS — R351 Nocturia: Secondary | ICD-10-CM | POA: Insufficient documentation

## 2018-02-28 ENCOUNTER — Emergency Department: Payer: Medicare Other

## 2018-02-28 ENCOUNTER — Emergency Department
Admission: EM | Admit: 2018-02-28 | Discharge: 2018-02-28 | Disposition: A | Discharge status: Home / Self Care | Payer: Medicare Other | Attending: Emergency Medicine | Admitting: Emergency Medicine

## 2018-02-28 ENCOUNTER — Other Ambulatory Visit: Payer: Self-pay

## 2018-02-28 DIAGNOSIS — I110 Hypertensive heart disease with heart failure: Secondary | ICD-10-CM | POA: Insufficient documentation

## 2018-02-28 DIAGNOSIS — Z87891 Personal history of nicotine dependence: Secondary | ICD-10-CM | POA: Diagnosis not present

## 2018-02-28 DIAGNOSIS — Z7982 Long term (current) use of aspirin: Secondary | ICD-10-CM | POA: Diagnosis not present

## 2018-02-28 DIAGNOSIS — Z79899 Other long term (current) drug therapy: Secondary | ICD-10-CM | POA: Insufficient documentation

## 2018-02-28 DIAGNOSIS — Z955 Presence of coronary angioplasty implant and graft: Secondary | ICD-10-CM | POA: Insufficient documentation

## 2018-02-28 DIAGNOSIS — R002 Palpitations: Secondary | ICD-10-CM | POA: Diagnosis present

## 2018-02-28 DIAGNOSIS — I2510 Atherosclerotic heart disease of native coronary artery without angina pectoris: Secondary | ICD-10-CM | POA: Diagnosis not present

## 2018-02-28 DIAGNOSIS — Z7902 Long term (current) use of antithrombotics/antiplatelets: Secondary | ICD-10-CM | POA: Insufficient documentation

## 2018-02-28 DIAGNOSIS — I509 Heart failure, unspecified: Secondary | ICD-10-CM | POA: Insufficient documentation

## 2018-02-28 DIAGNOSIS — R001 Bradycardia, unspecified: Secondary | ICD-10-CM | POA: Diagnosis not present

## 2018-02-28 LAB — TROPONIN I: Troponin I: <0.03 ng/mL (ref <0.03)

## 2018-02-28 LAB — BASIC METABOLIC PANEL
Anion gap: 8 (ref 5–15)
BUN: 16 mg/dL (ref 8–23)
CO2: 27 mmol/L (ref 22–32)
Calcium: 9.6 mg/dL (ref 8.9–10.3)
Chloride: 103 mmol/L (ref 98–111)
Creatinine, Ser: 0.85 mg/dL (ref 0.61–1.24)
GFR calc Af Amer: >60 mL/min (ref >60)
GFR calc non Af Amer: >60 mL/min (ref >60)
Glucose, Bld: 109 mg/dL — ABNORMAL HIGH (ref 70–99)
Potassium: 4 mmol/L (ref 3.5–5.1)
Sodium: 138 mmol/L (ref 135–145)

## 2018-02-28 LAB — CBC
HCT: 40.6 % (ref 39.0–52.0)
Hemoglobin: 13.9 g/dL (ref 13.0–17.0)
MCH: 31.2 pg (ref 26.0–34.0)
MCHC: 34.2 g/dL (ref 30.0–36.0)
MCV: 91.2 fL (ref 80.0–100.0)
Platelets: 197 10*3/uL (ref 150–400)
RBC: 4.45 MIL/uL (ref 4.22–5.81)
RDW: 13 % (ref 11.5–15.5)
WBC: 5.6 10*3/uL (ref 4.0–10.5)
nRBC: 0 % (ref 0.0–0.2)

## 2018-02-28 NOTE — ED Triage Notes (Signed)
Pt comes into the ED via EMS from home with c/o "heart skipping beats" states he noticed on Tuesday while he was resting on his side. States he has some SOB with exertion. Denies having any pain.

## 2018-02-28 NOTE — ED Provider Notes (Signed)
[facility] Emergency Department Provider Note ____________________________________________   First MD Initiated Contact with Patient 02/28/18 1126     (approximate)  I have reviewed the triage vital signs and the nursing notes.   HISTORY  Chief Complaint Palpitations    HPI Scott Cortez is a 84 y.o. male with PMH as noted below including CHF, CAD, and hypertension who presents with palpitations, acute onset 2 days ago and occurring intermittently since then, mainly in the morning.  Patient had a brief episode today.  He states he feels like his heart skips a beat.  He denies associated dizziness or lightheadedness, chest pain, or shortness of breath.  He is asymptomatic currently.  Past Medical History:  Diagnosis Date  . Bladder neck obstruction   . Bradycardia   . CHF (congestive heart failure) (HCC)   . Chronic pancreatitis (HCC)   . Coronary artery disease   . GERD (gastroesophageal reflux disease)   . Glaucoma   . Hyperlipidemia   . Hypertension   . Myocardial infarction (HCC)     There are no active problems to display for this patient.   Past Surgical History:  Procedure Laterality Date  . COLONOSCOPY N/A 10/15/2014   Procedure: COLONOSCOPY;  Surgeon: Scot Jun, MD;  Location: [facility] ENDOSCOPY;  Service: Endoscopy;  Laterality: N/A;  . coronary stenting    . ESOPHAGOGASTRODUODENOSCOPY (EGD) WITH PROPOFOL N/A 10/15/2014   Procedure: ESOPHAGOGASTRODUODENOSCOPY (EGD) WITH PROPOFOL;  Surgeon: Scot Jun, MD;  Location: [facility] ENDOSCOPY;  Service: Endoscopy;  Laterality: N/A;  . SAVORY DILATION N/A 10/15/2014   Procedure: SAVORY DILATION;  Surgeon: Scot Jun, MD;  Location: [facility] ENDOSCOPY;  Service: Endoscopy;  Laterality: N/A;    Prior to Admission medications   Medication Sig Start Date End Date Taking? Authorizing Provider  amLODipine (NORVASC) 5 MG tablet Take 5 mg by mouth daily.    [provider]  aspirin 325  MG tablet Take 325 mg by mouth daily.    [provider]  atorvastatin (LIPITOR) 40 MG tablet Take 40 mg by mouth daily.    [provider]  brimonidine (ALPHAGAN) 0.2 % ophthalmic solution Place 1 drop into both eyes 2 (two) times daily.    [provider]  clopidogrel (PLAVIX) 75 MG tablet Take 75 mg by mouth daily.    [provider]  latanoprost (XALATAN) 0.005 % ophthalmic solution Place 1 drop into both eyes every morning.    [provider]  losartan (COZAAR) 50 MG tablet Take 50 mg by mouth daily.    [provider]  ranitidine (ZANTAC) 150 MG capsule Take 150 mg by mouth daily.    [provider]  sildenafil (VIAGRA) 100 MG tablet Take 100 mg by mouth daily as needed for erectile dysfunction.    [provider]  tamsulosin (FLOMAX) 0.4 MG CAPS capsule Take 0.4 mg by mouth daily.    [provider]  timolol (BETIMOL) 0.5 % ophthalmic solution Place 1 drop into both eyes 2 (two) times daily.    [provider]    Allergies Patient has no known allergies.  No family history on file.  Social History Social History   Tobacco Use  . Smoking status: Former Games developer  . Smokeless tobacco: Never Used  Substance Use Topics  . Alcohol use: No  . Drug use: Yes    Types: Marijuana    Review of Systems  Constitutional: No fever. Eyes: No redness. ENT: No neck pain.  Cardiovascular: Denies chest pain.  Positive for palpitations. Respiratory: Denies shortness of breath. Gastrointestinal: No vomiting.  Genitourinary: Negative for flank pain.  Musculoskeletal: Negative for back pain. Skin: Negative for rash. Neurological: Negative for headache.   ____________________________________________   PHYSICAL EXAM:  VITAL SIGNS: ED Triage Vitals [02/28/18 1025]  Enc Vitals Group     BP (!) 146/75     Pulse Rate (!) 53     Resp 17     Temp 97.6 F (36.4 C)     Temp Source Oral     SpO2 96 %      Weight 182 lb (82.6 kg)     Height 5\' 9"  (1.753 m)     Head Circumference      Peak Flow      Pain Score 0     Pain Loc      Pain Edu?      Excl. in GC?     Constitutional: Alert and oriented. Well appearing for age and in no acute distress. Eyes: Conjunctivae are normal.  Head: Atraumatic. Nose: No congestion/rhinnorhea. Mouth/Throat: Mucous membranes are moist.   Neck: Normal range of motion.  Cardiovascular: Borderline bradycardic, regular rhythm. Grossly normal heart sounds.  Good peripheral circulation. Respiratory: Normal respiratory effort.  No retractions. Lungs CTAB. Gastrointestinal: No distention.  Musculoskeletal: Minimal lower extremity edema.  No calf or popliteal swelling or tenderness.  Extremities warm and well perfused.  Neurologic:  Normal speech and language. No gross focal neurologic deficits are appreciated.  Skin:  Skin is warm and dry. No rash noted. Psychiatric: Mood and affect are normal. Speech and behavior are normal.  ____________________________________________   LABS (all labs ordered are listed, but only abnormal results are displayed)  Labs Reviewed  BASIC METABOLIC PANEL - Abnormal; Notable for the following components:      Result Value   Glucose, Bld 109 (*)    All other components within normal limits  CBC  TROPONIN I   ____________________________________________  EKG  ED ECG REPORT I, Dionne Bucy, the attending physician, personally viewed and interpreted this ECG.  Date: 02/28/2018 EKG Time: 1004 Rate: 53 Rhythm: normal sinus rhythm QRS Axis: normal Intervals: normal ST/T Wave abnormalities: normal Narrative Interpretation: no evidence of acute ischemia  ____________________________________________  RADIOLOGY  CXR: No focal infiltrate or other acute abnormality  ____________________________________________   PROCEDURES  Procedure(s) performed: No  Procedures  Critical Care performed:  No ____________________________________________   INITIAL IMPRESSION / ASSESSMENT AND PLAN / ED COURSE  Pertinent labs & imaging results that were available during my care of the patient were reviewed by me and considered in my medical decision making (see chart for details).  84 year old male with PMH as noted above presents with intermittent palpitations over the last 2 days, described as feeling like his heart skips a beat and short in duration.  He is asymptomatic currently.  No associated chest pain or shortness of breath.  On exam, the patient is well-appearing and his vital signs are normal except for a heart rate ranging from the high 40s to the mid 50s.  However he states he does take a medication to slow down his heart rate.  The remainder of the exam is unremarkable.  His EKG is normal (it was missed read by the computer as junctional rhythm, but I see clear P waves) and his initial labs are also within normal limits.  There is no evidence of an arrhythmia or other acute cardiac etiology.  It is possible  that the patient is having some intermittent PVCs or other benign etiology.  ----------------------------------------- 12:48 PM on 02/28/2018 -----------------------------------------  I consulted Dr. Gwen Pounds from cardiology who is covering for Dr. Juliann Pares.  Dr. Gwen Pounds agrees with the plan for discharge, and he advises that the patient can follow-up anytime next week and should call the office.  I further reviewed the patient's medications and he is not on any beta-blocker or calcium channel blocker that should lower his rate.  However at this time he is maintaining good blood pressure, is not orthostatic or lightheaded, and is stable for discharge home.  The patient agrees with this plan.  Return precautions given, and he expresses understanding.  ____________________________________________   FINAL CLINICAL IMPRESSION(S) / ED DIAGNOSES  Final diagnoses:  Palpitations   Bradycardia      NEW MEDICATIONS STARTED DURING THIS VISIT:  New Prescriptions   No medications on file     Note:  This document was prepared using Dragon voice recognition software and may include unintentional dictation errors.    Dionne Bucy, MD 02/28/18 1250

## 2018-02-28 NOTE — Discharge Instructions (Addendum)
Continue to take your normal medications as prescribed.  Make an appointment to follow-up with Dr. Juliann Pares sometime next week.  Return to the ER for new, worsening, persistent severe palpitations, dizziness or lightheadedness, chest pain, difficulty breathing, or any other new or worsening symptoms that concern you.

## 2018-02-28 NOTE — ED Notes (Signed)
First Nurse Note: Patient to ED via EMS with complaint of "palpitations".  Hx of stents.  Alert and oriented, skin color good. Skin warm and dry.  EKG completed and read by Dr. Roxan Hockey.

## 2018-04-15 ENCOUNTER — Encounter: Payer: Self-pay | Admitting: Urology

## 2018-04-15 ENCOUNTER — Ambulatory Visit: Payer: Self-pay | Admitting: Urology

## 2018-05-30 ENCOUNTER — Ambulatory Visit (INDEPENDENT_AMBULATORY_CARE_PROVIDER_SITE_OTHER): Payer: Medicare Other | Admitting: Urology

## 2018-05-30 ENCOUNTER — Encounter: Payer: Self-pay | Admitting: Urology

## 2018-05-30 VITALS — BP 158/93 | HR 58 | Ht 69.0 in | Wt 181.0 lb

## 2018-05-30 DIAGNOSIS — R350 Frequency of micturition: Secondary | ICD-10-CM

## 2018-05-30 DIAGNOSIS — N401 Enlarged prostate with lower urinary tract symptoms: Secondary | ICD-10-CM | POA: Diagnosis not present

## 2018-05-30 NOTE — Progress Notes (Signed)
05/30/2018 2:08 PM   Scott Cortez [birth year] [id number]  Referring provider: [facility], [facility] [address] [place], [place] [address]  Chief Complaint  Patient presents with  . Benign Prostatic Hypertrophy    [facility] transfer    HPI: 85 year old male I had previously seen at [facility] for BPH with lower urinary tract symptoms.  I last saw him in August 2018.  He is on tamsulosin 0.8 mg daily which he has continued.  Since his last visit he has noted worsening voiding symptoms with his most bothersome symptoms consisting of urinary frequency, intermittent urinary stream, postvoid dribbling and nocturia x2-4.  Denies dysuria, gross hematuria or flank/abdominal/pelvic/scrotal pain.   PMH: Past Medical History:  Diagnosis Date  . Bladder neck obstruction   . Bradycardia   . CHF (congestive heart failure) (HCC)   . Chronic pancreatitis (HCC)   . Coronary artery disease   . GERD (gastroesophageal reflux disease)   . Glaucoma   . Hyperlipidemia   . Hypertension   . Myocardial infarction [facility])     Surgical History: Past Surgical History:  Procedure Laterality Date  . COLONOSCOPY N/A 10/15/2014   Procedure: COLONOSCOPY;  Surgeon: Scot Jun, MD;  Location: [facility] ENDOSCOPY;  Service: Endoscopy;  Laterality: N/A;  . coronary stenting    . ESOPHAGOGASTRODUODENOSCOPY (EGD) WITH PROPOFOL N/A 10/15/2014   Procedure: ESOPHAGOGASTRODUODENOSCOPY (EGD) WITH PROPOFOL;  Surgeon: Scot Jun, MD;  Location: [facility] ENDOSCOPY;  Service: Endoscopy;  Laterality: N/A;  . SAVORY DILATION N/A 10/15/2014   Procedure: SAVORY DILATION;  Surgeon: Scot Jun, MD;  Location: [facility] ENDOSCOPY;  Service: Endoscopy;  Laterality: N/A;    Home Medications:  Allergies as of 05/30/2018   No Known Allergies     Medication List       Accurate as of May 30, 2018  2:08 PM. Always use your most recent med list.        amLODipine 5 MG tablet Commonly known as:  NORVASC Take 5 mg  by mouth daily.   aspirin 325 MG tablet Take 325 mg by mouth daily.   atorvastatin 40 MG tablet Commonly known as:  LIPITOR Take 40 mg by mouth daily.   brimonidine 0.2 % ophthalmic solution Commonly known as:  ALPHAGAN Place 1 drop into both eyes 2 (two) times daily.   clopidogrel 75 MG tablet Commonly known as:  PLAVIX Take 75 mg by mouth daily.   latanoprost 0.005 % ophthalmic solution Commonly known as:  XALATAN Place 1 drop into both eyes every morning.   losartan 50 MG tablet Commonly known as:  COZAAR Take 50 mg by mouth daily.   ranitidine 150 MG capsule Commonly known as:  ZANTAC Take 150 mg by mouth daily.   sildenafil 100 MG tablet Commonly known as:  VIAGRA Take 100 mg by mouth daily as needed for erectile dysfunction.   tamsulosin 0.4 MG Caps capsule Commonly known as:  FLOMAX Take 0.4 mg by mouth daily.   timolol 0.5 % ophthalmic solution Commonly known as:  BETIMOL Place 1 drop into both eyes 2 (two) times daily.       Allergies: No Known Allergies  Family History: No family history on file.  Social History:  reports that he has quit smoking. He has never used smokeless tobacco. He reports current drug use. Drug: Marijuana. He reports that he does not drink alcohol.  ROS: UROLOGY Frequent Urination?: No Hard to postpone urination?: Yes Burning/pain with urination?: Yes Get up at night  to urinate?: Yes Leakage of urine?: Yes Urine stream starts and stops?: Yes Trouble starting stream?: No Do you have to strain to urinate?: No Blood in urine?: No Urinary tract infection?: No Sexually transmitted disease?: No Injury to kidneys or bladder?: No Painful intercourse?: No Weak stream?: No Erection problems?: Yes Penile pain?: No  Gastrointestinal Nausea?: No Vomiting?: No Indigestion/heartburn?: No Diarrhea?: No Constipation?: No  Constitutional Fever: No Night sweats?: No Weight loss?: No Fatigue?: No  Skin Skin  rash/lesions?: No Itching?: No  Eyes Blurred vision?: No Double vision?: No  Ears/Nose/Throat Sore throat?: No Sinus problems?: Yes  Hematologic/Lymphatic Swollen glands?: No Easy bruising?: No  Cardiovascular Leg swelling?: No Chest pain?: No  Respiratory Cough?: No Shortness of breath?: No  Endocrine Excessive thirst?: No  Musculoskeletal Back pain?: Yes Joint pain?: Yes  Neurological Headaches?: No Dizziness?: No  Psychologic Depression?: No Anxiety?: No  Physical Exam: BP (!) 158/93 (BP Location: Left Arm, Patient Position: Sitting)   Pulse (!) 58   Ht 5\' 9"  (1.753 m)   Wt 181 lb (82.1 kg)   BMI 26.73 kg/m   Constitutional:  Alert and oriented, No acute distress. HEENT: [place] AT, moist mucus membranes.  Trachea midline, no masses. Cardiovascular: No clubbing, cyanosis, or edema. Respiratory: Normal respiratory effort, no increased work of breathing. GI: Abdomen is soft, nontender, nondistended, no abdominal masses GU: No CVA tenderness Lymph: No cervical or inguinal lymphadenopathy. Skin: No rashes, bruises or suspicious lesions. Neurologic: Grossly intact, no focal deficits, moving all 4 extremities. Psychiatric: Normal mood and affect.   Assessment & Plan:   85 year old male with BPH and lower urinary tract symptoms.  His symptoms have worsened since his last visit.  I discussed adding Myrbetriq for his storage related voiding symptoms however he does not desire to take additional medication.  Surgical options were discussed including TURP, PVP and UroLift.  He was potentially interested in UroLift but wanted to hold off until his next visit.  He will continue annual follow-up and will call back for any significant change in his voiding symptoms.   Return in about 1 year (around 05/31/2019) for Recheck.  [name], MD  [facility] [address] [place], [place] [address] [phone]

## 2018-06-20 ENCOUNTER — Ambulatory Visit: Payer: Medicare Other | Admitting: Urology

## 2018-06-20 ENCOUNTER — Ambulatory Visit (INDEPENDENT_AMBULATORY_CARE_PROVIDER_SITE_OTHER): Payer: Medicare Other | Admitting: Urology

## 2018-06-20 ENCOUNTER — Encounter: Payer: Self-pay | Admitting: Urology

## 2018-06-20 VITALS — BP 139/76 | HR 56 | Ht 69.0 in | Wt 187.6 lb

## 2018-06-20 DIAGNOSIS — N401 Enlarged prostate with lower urinary tract symptoms: Secondary | ICD-10-CM | POA: Diagnosis not present

## 2018-06-20 DIAGNOSIS — R350 Frequency of micturition: Secondary | ICD-10-CM

## 2018-06-20 LAB — BLADDER SCAN AMB NON-IMAGING

## 2018-06-20 NOTE — Progress Notes (Signed)
06/20/2018 2:51 PM   Johnnette GourdLawrence J Teachey 01/16/1934 [id number]  Referring provider: [facility], [facility] [address] [place], [place] [address]  Chief Complaint  Patient presents with  . Benign Prostatic Hypertrophy    HPI: 85 year old male with BPH presents today to discuss UroLift.  He was seen last month and he did not desire additional medication for his lower urinary tract symptoms.  We initially discussed UroLift at that visit and he wanted to think over.  He presents today with his sister-in-law.  His most bothersome symptoms are urinary frequency, urgency and intermittent urinary stream.  He is on tamsulosin 0.8 mg daily.  IPSS completed today was 15/3.  PMH: Past Medical History:  Diagnosis Date  . Bladder neck obstruction   . Bradycardia   . CHF (congestive heart failure) (HCC)   . Chronic pancreatitis (HCC)   . Coronary artery disease   . GERD (gastroesophageal reflux disease)   . Glaucoma   . Hyperlipidemia   . Hypertension   . Myocardial infarction [facility](HCC)     Surgical History: Past Surgical History:  Procedure Laterality Date  . COLONOSCOPY N/A 10/15/2014   Procedure: COLONOSCOPY;  Surgeon: Scot Junobert T Elliott, MD;  [facility];  Service: Endoscopy;  Laterality: N/A;  . coronary stenting    . ESOPHAGOGASTRODUODENOSCOPY (EGD) WITH PROPOFOL N/A 10/15/2014   Procedure: ESOPHAGOGASTRODUODENOSCOPY (EGD) WITH PROPOFOL;  Surgeon: Scot Junobert T Elliott, MD;  [facility];  Service: Endoscopy;  Laterality: N/A;  . SAVORY DILATION N/A 10/15/2014   Procedure: SAVORY DILATION;  Surgeon: Scot Junobert T Elliott, MD;  [facility];  Service: Endoscopy;  Laterality: N/A;    Home Medications:  Allergies as of 06/20/2018   No Known Allergies     Medication List       Accurate as of June 20, 2018  2:51 PM. Always use your most recent med list.        amLODipine 5 MG tablet Commonly known as:  NORVASC Take 5 mg by mouth daily.     aspirin 325 MG tablet Take 325 mg by mouth daily.   atorvastatin 40 MG tablet Commonly known as:  LIPITOR Take 40 mg by mouth daily.   brimonidine 0.2 % ophthalmic solution Commonly known as:  ALPHAGAN Place 1 drop into both eyes 2 (two) times daily.   clopidogrel 75 MG tablet Commonly known as:  PLAVIX Take 75 mg by mouth daily.   latanoprost 0.005 % ophthalmic solution Commonly known as:  XALATAN Place 1 drop into both eyes every morning.   losartan 50 MG tablet Commonly known as:  COZAAR Take 50 mg by mouth daily.   ranitidine 150 MG capsule Commonly known as:  ZANTAC Take 150 mg by mouth daily.   sildenafil 100 MG tablet Commonly known as:  VIAGRA Take 100 mg by mouth daily as needed for erectile dysfunction.   tamsulosin 0.4 MG Caps capsule Commonly known as:  FLOMAX Take 0.4 mg by mouth daily.   timolol 0.5 % ophthalmic solution Commonly known as:  BETIMOL Place 1 drop into both eyes 2 (two) times daily.       Allergies: No Known Allergies  Family History: No family history on file.  Social History:  reports that he has quit smoking. He has never used smokeless tobacco. He reports current drug use. Drug: Marijuana. He reports that he does not drink alcohol.  ROS: UROLOGY Frequent Urination?: Yes Hard to postpone urination?: Yes Burning/pain with urination?: No Get up at night  to urinate?: Yes Leakage of urine?: Yes Urine stream starts and stops?: Yes Trouble starting stream?: No Do you have to strain to urinate?: No Blood in urine?: No Urinary tract infection?: No Sexually transmitted disease?: No Injury to kidneys or bladder?: No Painful intercourse?: No Weak stream?: No Erection problems?: Yes Penile pain?: No  Gastrointestinal Nausea?: No Vomiting?: No Indigestion/heartburn?: No Diarrhea?: No Constipation?: No  Constitutional Fever: No Night sweats?: No Weight loss?: No Fatigue?: No  Skin Skin rash/lesions?: No Itching?:  No  Eyes Blurred vision?: Yes Double vision?: No  Ears/Nose/Throat Sore throat?: No Sinus problems?: Yes  Hematologic/Lymphatic Swollen glands?: No Easy bruising?: No  Cardiovascular Leg swelling?: No Chest pain?: No  Respiratory Cough?: Yes Shortness of breath?: No  Endocrine Excessive thirst?: No  Musculoskeletal Back pain?: Yes Joint pain?: Yes  Neurological Headaches?: No Dizziness?: No  Psychologic Depression?: No Anxiety?: No  Physical Exam: BP 139/76 (BP Location: Left Arm, Patient Position: Sitting, Cuff Size: Normal)   Pulse (!) 56   Ht 5\' 9"  (1.753 m)   Wt 187 lb 9.6 oz (85.1 kg)   BMI 27.70 kg/m   Constitutional:  Alert and oriented, No acute distress.  Assessment & Plan:   85 year old male with moderate lower urinary tract symptoms secondary to BPH.  We discussed UroLift including the procedure and recovery.  The most common postop side effects were discussed including frequency, dysuria and hematuria.  The incidence of low sexual side effects and incontinence was discussed.  He is interested in pursuing and will schedule cystoscopy and transrectal ultrasound for volume.  PVR by bladder scan was 139 mL.  Greater than 50% of this 15-minute visit was spent counseling the patient.   Riki AltesScott C Rayshell Goecke, MD  [facility] [address], [address] [place], [place] [address] [phone]

## 2018-06-20 NOTE — Patient Instructions (Signed)

## 2018-06-24 ENCOUNTER — Encounter: Payer: Self-pay | Admitting: Urology

## 2018-07-03 ENCOUNTER — Encounter: Payer: Self-pay | Admitting: Urology

## 2018-07-03 ENCOUNTER — Ambulatory Visit (INDEPENDENT_AMBULATORY_CARE_PROVIDER_SITE_OTHER): Payer: Medicare Other | Admitting: Urology

## 2018-07-03 VITALS — BP 130/80 | HR 80 | Ht 69.0 in | Wt 187.2 lb

## 2018-07-03 DIAGNOSIS — R339 Retention of urine, unspecified: Secondary | ICD-10-CM

## 2018-07-03 DIAGNOSIS — N401 Enlarged prostate with lower urinary tract symptoms: Secondary | ICD-10-CM

## 2018-07-03 DIAGNOSIS — N138 Other obstructive and reflux uropathy: Secondary | ICD-10-CM | POA: Diagnosis not present

## 2018-07-03 MED ORDER — LIDOCAINE HCL URETHRAL/MUCOSAL 2 % EX GEL
1.0000 "application " | Freq: Once | CUTANEOUS | Status: AC
  Administered 2018-07-03: 1 via URETHRAL

## 2018-07-03 NOTE — Patient Instructions (Signed)
Overactive Bladder, Adult  Overactive bladder refers to a condition in which a person has a sudden need to pass urine. The person may leak urine if he or she cannot get to the bathroom fast enough (urinary incontinence). A person with this condition may also wake up several times in the night to go to the bathroom. Overactive bladder is associated with poor nerve signals between your bladder and your brain. Your bladder may get the signal to empty before it is full. You may also have very sensitive muscles that make your bladder squeeze too soon. These symptoms might interfere with daily work or social activities. What are the causes? This condition may be associated with or caused by:  Urinary tract infection.  Infection of nearby tissues, such as the prostate.  Prostate enlargement.  Surgery on the uterus or urethra.  Bladder stones, inflammation, or tumors.  Drinking too much caffeine or alcohol.  Certain medicines, especially medicines that get rid of extra fluid in the body (diuretics).  Muscle or nerve weakness, especially from: ? A spinal cord injury. ? Stroke. ? Multiple sclerosis. ? Parkinson's disease.  Diabetes.  Constipation. What increases the risk? You may be at greater risk for overactive bladder if you:  Are an older adult.  Smoke.  Are going through menopause.  Have prostate problems.  Have a neurological disease, such as stroke, dementia, Parkinson's disease, or multiple sclerosis (MS).  Eat or drink things that irritate the bladder. These include alcohol, spicy food, and caffeine.  Are overweight or obese. What are the signs or symptoms? Symptoms of this condition include:  Sudden, strong urge to urinate.  Leaking urine.  Urinating 8 or more times a day.  Waking up to urinate 2 or more times a night. How is this diagnosed? Your health care provider may suspect overactive bladder based on your symptoms. He or she will diagnose this condition  by:  A physical exam and medical history.  Blood or urine tests. You might need bladder or urine tests to help determine what is causing your overactive bladder. You might also need to see a health care provider who specializes in urinary tract problems (urologist). How is this treated? Treatment for overactive bladder depends on the cause of your condition and whether it is mild or severe. You can also make lifestyle changes at home. Options include:  Bladder training. This may include: ? Learning to control the urge to urinate by following a schedule that directs you to urinate at regular intervals (timed voiding). ? Doing Kegel exercises to strengthen your pelvic floor muscles, which support your bladder. Toning these muscles can help you control urination, even if your bladder muscles are overactive.  Special devices. This may include: ? Biofeedback, which uses sensors to help you become aware of your body's signals. ? Electrical stimulation, which uses electrodes placed inside the body (implanted) or outside the body. These electrodes send gentle pulses of electricity to strengthen the nerves or muscles that control the bladder. ? Women may use a plastic device that fits into the vagina and supports the bladder (pessary).  Medicines. ? Antibiotics to treat bladder infection. ? Antispasmodics to stop the bladder from releasing urine at the wrong time. ? Tricyclic antidepressants to relax bladder muscles. ? Injections of botulinum toxin type A directly into the bladder tissue to relax bladder muscles.  Lifestyle changes. This may include: ? Weight loss. Talk to your health care provider about weight loss methods that would work best for you. ?   Diet changes. This may include reducing how much alcohol and caffeine you consume, or drinking fluids at different times of the day. ? Not smoking. Do not use any products that contain nicotine or tobacco, such as cigarettes and e-cigarettes. If  you need help quitting, ask your health care provider.  Surgery. ? A device may be implanted to help manage the nerve signals that control urination. ? An electrode may be implanted to stimulate electrical signals in the bladder. ? A procedure may be done to change the shape of the bladder. This is done only in very severe cases. Follow these instructions at home: Lifestyle  Make any diet or lifestyle changes that are recommended by your health care provider. These may include: ? Drinking less fluid or drinking fluids at different times of the day. ? Cutting down on caffeine or alcohol. ? Doing Kegel exercises. ? Losing weight if needed. ? Eating a healthy and balanced diet to prevent constipation. This may include:  Eating foods that are high in fiber, such as fresh fruits and vegetables, whole grains, and beans.  Limiting foods that are high in fat and processed sugars, such as fried and sweet foods. General instructions  Take over-the-counter and prescription medicines only as told by your health care provider.  If you were prescribed an antibiotic medicine, take it as told by your health care provider. Do not stop taking the antibiotic even if you start to feel better.  Use any implants or pessary as told by your health care provider.  If needed, wear pads to absorb urine leakage.  Keep a journal or log to track how much and when you drink and when you feel the need to urinate. This will help your health care provider monitor your condition.  Keep all follow-up visits as told by your health care provider. This is important. Contact a health care provider if:  You have a fever.  Your symptoms do not get better with treatment.  Your pain and discomfort get worse.  You have more frequent urges to urinate. Get help right away if:  You are not able to control your bladder. Summary  Overactive bladder refers to a condition in which a person has a sudden need to pass  urine.  Several conditions may lead to an overactive bladder.  Treatment for overactive bladder depends on the cause and severity of your condition.  Follow your health care provider's instructions about lifestyle changes, doing Kegel exercises, keeping a journal, and taking medicines. This information is not intended to replace advice given to you by your health care provider. Make sure you discuss any questions you have with your health care provider. Document Released: 02/24/2009 Document Revised: 05/16/2017 Document Reviewed: 05/16/2017 Elsevier Interactive Patient Education  2019 Elsevier Inc. Mirabegron extended-release tablets What is this medicine? MIRABEGRON (MIR a BEG ron) is used to treat overactive bladder. This medicine reduces the amount of bathroom visits. It may also help to control wetting accidents. It may be used alone, but sometimes may be given with other treatments. This medicine may be used for other purposes; ask your health care provider or pharmacist if you have questions. COMMON BRAND NAME(S): Myrbetriq What should I tell my health care provider before I take this medicine? They need to know if you have any of these conditions: -high blood pressure -kidney disease -liver disease -problems urinating -prostate disease -an unusual or allergic reaction to mirabegron, other medicines, foods, dyes, or preservatives -pregnant or trying to get pregnant -breast-feeding   How should I use this medicine? Take this medicine by mouth with a glass of water. Follow the directions on the prescription label. Do not cut, crush or chew this medicine. You can take it with or without food. If it upsets your stomach, take it with food. Take your medicine at regular intervals. Do not take it more often than directed. Do not stop taking except on your doctor's advice. Talk to your pediatrician regarding the use of this medicine in children. Special care may be needed. Overdosage: If you  think you have taken too much of this medicine contact a poison control center or emergency room at once. NOTE: This medicine is only for you. Do not share this medicine with others. What if I miss a dose? If you miss a dose, take it as soon as you can. If it is almost time for your next dose, take only that dose. Do not take double or extra doses. What may interact with this medicine? -codeine -desipramine -digoxin -flecainide -MAOIs like Carbex, Eldepryl, Marplan, Nardil, and Parnate -methadone -metoprolol -pimozide -propafenone -thioridazine -warfarin This list may not describe all possible interactions. Give your health care provider a list of all the medicines, herbs, non-prescription drugs, or dietary supplements you use. Also tell them if you smoke, drink alcohol, or use illegal drugs. Some items may interact with your medicine. What should I watch for while using this medicine? Visit your doctor or health care professional for regular checks on your progress. Check your blood pressure as directed. Ask your doctor or health care professional what your blood pressure should be and when you should contact him or her. You may need to limit your intake of tea, coffee, caffeinated sodas, or alcohol. These drinks may make your symptoms worse. What side effects may I notice from receiving this medicine? Side effects that you should report to your doctor or health care professional as soon as possible: -allergic reactions like skin rash, itching or hives, swelling of the face, lips, or tongue -high blood pressure -fast, irregular heartbeat -redness, blistering, peeling or loosening of the skin, including inside the mouth -signs of infection like fever or chills; pain or difficulty passing urine -trouble passing urine or change in the amount of urine Side effects that usually do not require medical attention (report to your doctor or health care professional if they continue or are  bothersome): -constipation -dry mouth -headache -runny nose -stomach upset This list may not describe all possible side effects. Call your doctor for medical advice about side effects. You may report side effects to FDA at 1-800-FDA-1088. Where should I keep my medicine? Keep out of the reach of children. Store at room temperature between 15 and 30 degrees C (59 and 86 degrees F). Throw away any unused medicine after the expiration date. NOTE: This sheet is a summary. It may not cover all possible information. If you have questions about this medicine, talk to your doctor, pharmacist, or health care provider.  2019 Elsevier/Gold Standard (2016-09-20 11:33:21)  

## 2018-07-03 NOTE — Progress Notes (Signed)
     07/04/18  CC:  Chief Complaint  Patient presents with  . Cysto    HPI: 85 year old male with urinary frequency, urgency and nocturia who is interested in UroLift.  He also complains of decreased force and caliber of his urinary stream.  Blood pressure 130/80, pulse 80, height 5\' 9"  (1.753 m), weight 187 lb 3.2 oz (84.9 kg). NED. A&Ox3.    Cystoscopy Procedure Note  Patient identification was confirmed, informed consent was obtained, and patient was prepped using Betadine solution.  Lidocaine jelly was administered per urethral meatus.     Pre-Procedure: - Inspection reveals a normal caliber ureteral meatus.  Procedure: The flexible cystoscope was introduced without difficulty - No urethral strictures/lesions are present. - Minimal lateral lobe enlargement prostate  - Normal bladder neck - Bilateral ureteral orifices identified - Bladder mucosa  reveals no ulcers, tumors, or lesions - No bladder stones - No trabeculation  Retroflexion shows no abnormalities or intravesical median lobe   Post-Procedure: - Patient tolerated the procedure well  Transrectal ultrasound prostate  The patient was then placed in the left lateral decubitus position.  A 7.5 MHz transrectal ultrasound probe was lubricated and gently inserted per rectum.  Prostate ultrasound was performed in transverse and sagittal views.  There was no significant transition zone enlargement.  No echogenic abnormalities of the peripheral zone was noted.  Prostate volume was calculated at 25 cc.  Assessment/ Plan: Based on cystoscopy and transrectal ultrasound I do not feel his voiding symptoms would improve with UroLift.  Today he states his primary symptoms are frequency, urgency and nocturia x3-4.  He was interested in a trial of Myrbetriq and given samples 25 mg.  Follow-up 1 month with Carollee Herter for symptom reassessment.  If he notes some improvement would titrate the Myrbetriq to 50 mg.  If his nocturia is  persistent would recommend a sleep study.  Return in about 4 weeks (around 07/31/2018) for 1 Month f/u with [facility].  [name], MD   [name], MD  I, Donne Hazel, am acting as a scribe for Dr. Lorin Picket C. Bubba Vanbenschoten,  I, [name], MD, have reviewed all documentation for this visit. The documentation on 07/04/18 for the exam, diagnosis, procedures, and orders are all accurate and complete.

## 2018-07-29 NOTE — Progress Notes (Signed)
07/31/2018 2:22 PM   Scott Cortez 1933-12-31 [id number]  Referring provider: Center, [facility] [address] [place], [place] [address]  Chief Complaint  Patient presents with  . Follow-up    HPI: Scott Cortez is a 85 y.o. male African American with BPH with LUTS and bladder neck obstruction who presents today for a one-month follow up.  Patient has been previously seen by Dr. Lonna Cobb, having transferred care here when Dr. Lonna Cobb joined this clinic.  His last IPSS was 15/3.  BPH WITH LUTS  (prostate and/or bladder) IPSS score: 9/2  PVR: 53 mL  Previous score: 15/3  Currently taking: Tamsulosin 0.4 mg and Myrbetriq 25 mg daily  On 07/03/2018, patient had a cystoscopy and transrectal ultrasound of his prostate.  NED on both, with his prostate volume calculated at 25 cc.  Patient was reporting on that visit that his primary symptoms are frequency, urgency, and nocturia x 3-4.  He started a trial of Myrbetriq 25 mg and given samples.  Today (07/31/2018), patient says that he has seen improvement.  He used have nocturia x every 2 hours, and his urgency has improved enough for him to be able to reach the bathroom.  Denies any dysuria, hematuria or suprapubic pain.  Denies any recent fevers, chills, nausea or vomiting.  He would to increase the medication to 50 mg.   IPSS    Row Name 07/31/18 1300         International Prostate Symptom Score   How often have you had the sensation of not emptying your bladder?  About half the time     How often have you had to urinate less than every two hours?  Less than half the time     How often have you found you stopped and started again several times when you urinated?  Less than 1 in 5 times     How often have you found it difficult to postpone urination?  Less than 1 in 5 times     How often have you had a weak urinary stream?  Less than 1 in 5 times     How often have you had to strain to start urination?  Not at All      How many times did you typically get up at night to urinate?  1 Time     Total IPSS Score  9       Quality of Life due to urinary symptoms   If you were to spend the rest of your life with your urinary condition just the way it is now how would you feel about that?  Mostly Satisfied       Score:  1-7 Mild 8-19 Moderate 20-35 Severe  PMH: Past Medical History:  Diagnosis Date  . Bladder neck obstruction   . Bradycardia   . CHF (congestive heart failure) (HCC)   . Chronic pancreatitis (HCC)   . Coronary artery disease   . GERD (gastroesophageal reflux disease)   . Glaucoma   . Hyperlipidemia   . Hypertension   . Myocardial infarction [facility])     Surgical History: Past Surgical History:  Procedure Laterality Date  . COLONOSCOPY N/A 10/15/2014   Procedure: COLONOSCOPY;  Surgeon: Scot Jun, MD;  Location: [facility] ENDOSCOPY;  Service: Endoscopy;  Laterality: N/A;  . coronary stenting    . ESOPHAGOGASTRODUODENOSCOPY (EGD) WITH PROPOFOL N/A 10/15/2014   Procedure: ESOPHAGOGASTRODUODENOSCOPY (EGD) WITH PROPOFOL;  Surgeon: Scot Jun, MD;  Location:  [facility] ENDOSCOPY;  Service: Endoscopy;  Laterality: N/A;  . SAVORY DILATION N/A 10/15/2014   Procedure: SAVORY DILATION;  Surgeon: Scot Jun, MD;  Location: [facility] ENDOSCOPY;  Service: Endoscopy;  Laterality: N/A;    Home Medications:  Allergies as of 07/31/2018   No Known Allergies     Medication List       Accurate as of July 31, 2018  2:22 PM. Always use your most recent med list.        amLODipine 5 MG tablet Commonly known as:  NORVASC Take 5 mg by mouth daily.   aspirin 325 MG tablet Take 325 mg by mouth daily.   clopidogrel 75 MG tablet Commonly known as:  PLAVIX Take 75 mg by mouth daily.   latanoprost 0.005 % ophthalmic solution Commonly known as:  XALATAN Place 1 drop into both eyes every morning.   losartan 50 MG tablet Commonly known as:  COZAAR Take 50 mg by mouth daily.   mirabegron ER 50 MG  Tb24 tablet Commonly known as:  MYRBETRIQ Take 1 tablet (50 mg total) by mouth daily.   ranitidine 150 MG capsule Commonly known as:  ZANTAC Take 150 mg by mouth daily.   tamsulosin 0.4 MG Caps capsule Commonly known as:  FLOMAX Take 0.4 mg by mouth daily.   timolol 0.5 % ophthalmic solution Commonly known as:  BETIMOL Place 1 drop into both eyes 2 (two) times daily.       Allergies: No Known Allergies  Family History: No family history on file.  Social History:  reports that he has quit smoking. He has never used smokeless tobacco. He reports current drug use. Drug: Marijuana. He reports that he does not drink alcohol.  ROS: UROLOGY Frequent Urination?: Yes Hard to postpone urination?: Yes Burning/pain with urination?: No Get up at night to urinate?: Yes Leakage of urine?: Yes Urine stream starts and stops?: Yes Trouble starting stream?: No Do you have to strain to urinate?: No Blood in urine?: No Urinary tract infection?: No Sexually transmitted disease?: No Injury to kidneys or bladder?: No Painful intercourse?: No Weak stream?: Yes Erection problems?: Yes Penile pain?: No  Gastrointestinal Nausea?: No Vomiting?: No Indigestion/heartburn?: No Diarrhea?: No Constipation?: No  Constitutional Fever: No Night sweats?: No Weight loss?: No Fatigue?: No  Skin Skin rash/lesions?: No Itching?: No  Eyes Blurred vision?: No Double vision?: No  Ears/Nose/Throat Sore throat?: No Sinus problems?: No  Hematologic/Lymphatic Swollen glands?: No Easy bruising?: No  Cardiovascular Leg swelling?: No Chest pain?: No  Respiratory Cough?: Yes Shortness of breath?: No  Endocrine Excessive thirst?: No  Musculoskeletal Back pain?: Yes Joint pain?: Yes  Neurological Headaches?: No Dizziness?: No  Psychologic Depression?: No Anxiety?: No  Physical Exam: BP (!) 143/80   Pulse 61   Ht  (1.753 m)   Wt 177 lb (80.3 kg)   BMI 26.14 kg/m    Constitutional:  Well nourished. Alert and oriented, No acute distress. HEENT: [place] AT, moist mucus membranes.  Trachea midline. Cardiovascular: No clubbing, cyanosis, or edema. Respiratory: Normal respiratory effort, no increased work of breathing. Skin: No rashes, bruises or suspicious lesions. Neurologic: Grossly intact, no focal deficits, moving all 4 extremities. Psychiatric: Normal mood and affect.  Laboratory Data: Lab Results  Component Value Date   WBC 5.6 02/28/2018   HGB 13.9 02/28/2018   HCT 40.6 02/28/2018   MCV 91.2 02/28/2018   PLT 197 02/28/2018   Lab Results  Component Value Date   CREATININE 0.85 02/28/2018  I have reviewed the labs.  Pertinent Imaging:  Results for orders placed or performed in visit on 07/31/18  BLADDER SCAN AMB NON-IMAGING  Result Value Ref Range   Scan Result 7ml    Assessment & Plan:    1. BPH with LUTS - IPSS is 9/2; it is improved - Continue tamsulosin 0.4 mg daily - Patient provided with prescription for Myrbetriq 50 mg daily; he will check to see if his insurance will cover - RTC in 3 months for IPSS and PVR  Return in about 3 months (around 10/31/2018) for IPSS and PVR.  Michiel Cowboy, PA-C  [facility] [address], [address] [place], [place] [address] [phone]  I, Duanne Moron, am acting as a scribe for [facility], PA-C   I have reviewed the above documentation for accuracy and completeness, and I agree with the above.    Michiel Cowboy, PA-C

## 2018-07-31 ENCOUNTER — Encounter: Payer: Self-pay | Admitting: Urology

## 2018-07-31 ENCOUNTER — Other Ambulatory Visit: Payer: Self-pay

## 2018-07-31 ENCOUNTER — Ambulatory Visit (INDEPENDENT_AMBULATORY_CARE_PROVIDER_SITE_OTHER): Payer: Medicare Other | Admitting: Urology

## 2018-07-31 VITALS — BP 143/80 | HR 61 | Ht 69.0 in | Wt 177.0 lb

## 2018-07-31 DIAGNOSIS — N138 Other obstructive and reflux uropathy: Secondary | ICD-10-CM | POA: Diagnosis not present

## 2018-07-31 DIAGNOSIS — N401 Enlarged prostate with lower urinary tract symptoms: Secondary | ICD-10-CM

## 2018-07-31 LAB — BLADDER SCAN AMB NON-IMAGING

## 2018-07-31 MED ORDER — MIRABEGRON ER 50 MG PO TB24: 50.0000 mg | ORAL_TABLET | Freq: Every day | ORAL | 3 refills | Status: DC

## 2018-10-30 ENCOUNTER — Telehealth (INDEPENDENT_AMBULATORY_CARE_PROVIDER_SITE_OTHER): Payer: Medicare Other | Admitting: Urology

## 2018-10-30 ENCOUNTER — Telehealth: Payer: Self-pay | Admitting: Urology

## 2018-10-30 ENCOUNTER — Other Ambulatory Visit: Payer: Self-pay

## 2018-10-30 ENCOUNTER — Ambulatory Visit: Payer: Medicare Other | Admitting: Urology

## 2018-10-30 DIAGNOSIS — N138 Other obstructive and reflux uropathy: Secondary | ICD-10-CM

## 2018-10-30 DIAGNOSIS — N401 Enlarged prostate with lower urinary tract symptoms: Secondary | ICD-10-CM

## 2018-10-30 DIAGNOSIS — R351 Nocturia: Secondary | ICD-10-CM

## 2018-10-30 NOTE — Progress Notes (Signed)
Virtual Visit via Telephone Note  I connected with Scott Cortez on 10/30/2018 at 1130 by telephone and verified that I am speaking with the correct person using two identifiers.  They are located at home.  I am located at my home.    This visit type was conducted due to national recommendations for restrictions regarding the COVID-19 Pandemic (e.g. social distancing).  This format is felt to be most appropriate for this patient at this time.  All issues noted in this document were discussed and addressed.  No physical exam was performed.   I discussed the limitations, risks, security and privacy concerns of performing an evaluation and management service by telephone and the availability of in person appointments. I also discussed with the patient that there may be a patient responsible charge related to this service. The patient expressed understanding and agreed to proceed.   History of Present Illness: Scott Cortez is an 85 year old male with BPH with LU TS who is contacted today by telephone for a three month follow up after increasing his Myrbetriq to 50 mg.  BPH with LU TS - cysto 06/2018 - NED  TRUSP 06/2018 prostate volume 25 cc.  predominate symptoms of nocturia and urgency.  Currently taking tamsulosin 0.4 mg twice daily and Myrbetriq 50 mg daily.  He has noticed a decrease in his urgency, but he is still having nocturia x 1-2.  Patient denies any gross hematuria, dysuria or suprapubic/flank pain.  Patient denies any fevers, chills, nausea or vomiting.    Observations/Objective: Scott Cortez does not sound distressed and is answering questions appropriately.    Assessment and Plan:  1. BPH with LU TS Most bothersome symptoms of urgency and nocturia Continue Myrbetriq 50 mg daily  May discontinue the tamsulosin 0.4 mg night time dose, but continue his morning dose  2. Nocturia Consider discontinuing the night time dose of tamsulosin and see if the nocturia improves The patient may  benefit from a discussion with his or her primary care physician to see if he or she has risk factors for sleep apnea or other sleep disturbances and obtaining a sleep study.   Follow Up Instructions:  Scott Cortez will follow up in 6 months for I PSS and PVR.    I discussed the assessment and treatment plan with the patient. The patient was provided an opportunity to ask questions and all were answered. The patient agreed with the plan and demonstrated an understanding of the instructions.   The patient was advised to call back or seek an in-person evaluation if the symptoms worsen or if the condition fails to improve as anticipated.  I provided 20 minutes of non-face-to-face time during this encounter.   Scott Sinn, PA-C

## 2018-10-30 NOTE — Telephone Encounter (Signed)
Would you schedule Scott Cortez for a 6 month follow up for I PSS and PVR?

## 2018-11-27 ENCOUNTER — Telehealth: Payer: Self-pay | Admitting: Urology

## 2018-11-27 NOTE — Telephone Encounter (Signed)
Cindy from the Pharmacy called and states that the pt is having an adverse reaction to Myrbetriq possibly. She asked if he could be switched to another medicine Theatre manager)

## 2018-11-27 NOTE — Telephone Encounter (Signed)
Spoke with patient and he states he is having dry mouth and leg swelling and he states that the insert for myrbetriq can cause this so he will not be taking this any longer. He is not interested in the Vesicare if it too can cause dry mouth.   Patient also claims his feet have been so swollen for months that he cannot wear his shoes and feels like he is walking on "tennis balls". Patient states he also is having numbness in his tongue. Patient was strongly encouraged to follow up with PCP right away for further evaluation.

## 2018-11-28 ENCOUNTER — Telehealth: Payer: Self-pay | Admitting: Urology

## 2018-11-28 NOTE — Telephone Encounter (Signed)
Patient called today and stated that we had his DOB wrong in the system and that he was born on [birth year] and that he would bring in his birth certificate to show proof of this. He received a letter from [facility] stating that they would not pay anymore claims unless we changed it from 1933/12/25 to February 16, 1934 so per the patient's request I have updated it.   Sharyn Lull

## 2019-01-26 ENCOUNTER — Other Ambulatory Visit: Payer: Self-pay

## 2019-01-26 ENCOUNTER — Encounter: Payer: Self-pay | Admitting: *Deleted

## 2019-01-26 NOTE — Anesthesia Preprocedure Evaluation (Addendum)
Anesthesia Evaluation  Patient identified by MRN, date of birth, ID band Patient awake    Reviewed: Allergy & Precautions, NPO status , Patient's Chart, lab work & pertinent test results  History of Anesthesia Complications Negative for: history of anesthetic complications  Airway Mallampati: II  TM Distance: >3 FB Neck ROM: Full    Dental   Pulmonary former smoker,    breath sounds clear to auscultation       Cardiovascular hypertension, (-) angina+ CAD, + Past MI, + Cardiac Stents (RCA stent x2) and +CHF  (-) DOE  Rhythm:Regular Rate:Normal   HLD  TTE (12/2013): NORMAL LEFT VENTRICULAR SYSTOLIC FUNCTION WITH AN ESTIMATED EF = 50-55 % NORMAL RIGHT VENTRICULAR SYSTOLIC FUNCTION MILD-TO-MODERATE TRICUSPID VALVE INSUFFICIENCY MILD MITRAL VALVE INSUFFICIENCY NO VALVULAR STENOSIS MILD LEFT ATRIUM ENLARGEMENT Bordreline inferior posterior hypokinesis   Neuro/Psych  Ataxia  Neuromuscular disease (Peripheral neuropathy)    GI/Hepatic GERD  , Chronic pancreatitis   Endo/Other    Renal/GU      Musculoskeletal   Abdominal   Peds  Hematology   Anesthesia Other Findings   Reproductive/Obstetrics                           Anesthesia Physical Anesthesia Plan  ASA: III  Anesthesia Plan: MAC   Post-op Pain Management:    Induction: Intravenous  PONV Risk Score and Plan: 1 and TIVA and Midazolam  Airway Management Planned: Nasal Cannula  Additional Equipment:   Intra-op Plan:   Post-operative Plan:   Informed Consent: I have reviewed the patients History and Physical, chart, labs and discussed the procedure including the risks, benefits and alternatives for the proposed anesthesia with the patient or authorized representative who has indicated his/her understanding and acceptance.       Plan Discussed with: CRNA and Anesthesiologist  Anesthesia Plan Comments:          Anesthesia Quick Evaluation

## 2019-01-29 ENCOUNTER — Other Ambulatory Visit
Admission: RE | Admit: 2019-01-29 | Discharge: 2019-01-29 | Disposition: A | Discharge status: Home / Self Care | Payer: Medicare Other | Source: Ambulatory Visit | Attending: Ophthalmology | Admitting: Ophthalmology

## 2019-01-29 DIAGNOSIS — Z01812 Encounter for preprocedural laboratory examination: Secondary | ICD-10-CM | POA: Insufficient documentation

## 2019-01-29 DIAGNOSIS — Z20828 Contact with and (suspected) exposure to other viral communicable diseases: Secondary | ICD-10-CM | POA: Diagnosis not present

## 2019-01-29 NOTE — Discharge Instructions (Signed)

## 2019-01-30 LAB — SARS CORONAVIRUS 2 (TAT 6-24 HRS): SARS Coronavirus 2: NEGATIVE

## 2019-02-02 ENCOUNTER — Ambulatory Visit: Payer: Medicare Other | Admitting: Anesthesiology

## 2019-02-02 ENCOUNTER — Other Ambulatory Visit: Payer: Self-pay

## 2019-02-02 ENCOUNTER — Encounter
Admission: RE | Disposition: A | Discharge status: Home / Self Care | Payer: Self-pay | Source: Home / Self Care | Attending: Ophthalmology

## 2019-02-02 ENCOUNTER — Ambulatory Visit
Admission: RE | Admit: 2019-02-02 | Discharge: 2019-02-02 | Disposition: A | Discharge status: Home / Self Care | Payer: Medicare Other | Attending: Ophthalmology | Admitting: Ophthalmology

## 2019-02-02 DIAGNOSIS — I252 Old myocardial infarction: Secondary | ICD-10-CM | POA: Diagnosis not present

## 2019-02-02 DIAGNOSIS — E785 Hyperlipidemia, unspecified: Secondary | ICD-10-CM | POA: Insufficient documentation

## 2019-02-02 DIAGNOSIS — Z79899 Other long term (current) drug therapy: Secondary | ICD-10-CM | POA: Diagnosis not present

## 2019-02-02 DIAGNOSIS — K219 Gastro-esophageal reflux disease without esophagitis: Secondary | ICD-10-CM | POA: Insufficient documentation

## 2019-02-02 DIAGNOSIS — Z7902 Long term (current) use of antithrombotics/antiplatelets: Secondary | ICD-10-CM | POA: Diagnosis not present

## 2019-02-02 DIAGNOSIS — H4010X1 Unspecified open-angle glaucoma, mild stage: Secondary | ICD-10-CM | POA: Insufficient documentation

## 2019-02-02 DIAGNOSIS — I110 Hypertensive heart disease with heart failure: Secondary | ICD-10-CM | POA: Insufficient documentation

## 2019-02-02 DIAGNOSIS — Z87891 Personal history of nicotine dependence: Secondary | ICD-10-CM | POA: Insufficient documentation

## 2019-02-02 DIAGNOSIS — Z955 Presence of coronary angioplasty implant and graft: Secondary | ICD-10-CM | POA: Diagnosis not present

## 2019-02-02 DIAGNOSIS — Z951 Presence of aortocoronary bypass graft: Secondary | ICD-10-CM | POA: Diagnosis not present

## 2019-02-02 DIAGNOSIS — H2512 Age-related nuclear cataract, left eye: Secondary | ICD-10-CM | POA: Diagnosis not present

## 2019-02-02 DIAGNOSIS — Z7982 Long term (current) use of aspirin: Secondary | ICD-10-CM | POA: Insufficient documentation

## 2019-02-02 DIAGNOSIS — I2510 Atherosclerotic heart disease of native coronary artery without angina pectoris: Secondary | ICD-10-CM | POA: Insufficient documentation

## 2019-02-02 DIAGNOSIS — I714 Abdominal aortic aneurysm, without rupture: Secondary | ICD-10-CM | POA: Insufficient documentation

## 2019-02-02 DIAGNOSIS — G20 Parkinson's disease: Secondary | ICD-10-CM | POA: Insufficient documentation

## 2019-02-02 DIAGNOSIS — I509 Heart failure, unspecified: Secondary | ICD-10-CM | POA: Insufficient documentation

## 2019-02-02 HISTORY — PX: INSERTION OF ANTERIOR SEGMENT AQUEOUS DRAINAGE DEVICE (ISTENT): SHX6783

## 2019-02-02 HISTORY — DX: Polyneuropathy, unspecified: G62.9

## 2019-02-02 HISTORY — PX: CATARACT EXTRACTION W/PHACO: SHX586

## 2019-02-02 HISTORY — DX: Presence of dental prosthetic device (complete) (partial): Z97.2

## 2019-02-02 SURGERY — PHACOEMULSIFICATION, CATARACT, WITH IOL INSERTION
Anesthesia: Monitor Anesthesia Care | Site: Eye | Laterality: Left

## 2019-02-02 MED ORDER — LACTATED RINGERS IV SOLN: 100.0000 mL/h | INTRAVENOUS | Status: DC

## 2019-02-02 MED ORDER — EPINEPHRINE PF 1 MG/ML IJ SOLN
INTRAOCULAR | Status: DC | PRN
  Administered 2019-02-02: 85 mL via OPHTHALMIC

## 2019-02-02 MED ORDER — TETRACAINE HCL 0.5 % OP SOLN
1.0000 [drp] | OPHTHALMIC | Status: DC | PRN
  Administered 2019-02-02 (×3): 1 [drp] via OPHTHALMIC

## 2019-02-02 MED ORDER — ARMC OPHTHALMIC DILATING DROPS
1.0000 "application " | OPHTHALMIC | Status: DC | PRN
  Administered 2019-02-02 (×3): 1 via OPHTHALMIC

## 2019-02-02 MED ORDER — ACETAMINOPHEN 10 MG/ML IV SOLN: 1000.0000 mg | Freq: Once | INTRAVENOUS | Status: DC | PRN

## 2019-02-02 MED ORDER — MIDAZOLAM HCL 2 MG/2ML IJ SOLN
INTRAMUSCULAR | Status: DC | PRN
  Administered 2019-02-02: 2 mg via INTRAVENOUS

## 2019-02-02 MED ORDER — MOXIFLOXACIN HCL 0.5 % OP SOLN
OPHTHALMIC | Status: DC | PRN
  Administered 2019-02-02: 0.2 mL via OPHTHALMIC

## 2019-02-02 MED ORDER — FENTANYL CITRATE (PF) 100 MCG/2ML IJ SOLN
INTRAMUSCULAR | Status: DC | PRN
  Administered 2019-02-02: 50 ug via INTRAVENOUS

## 2019-02-02 MED ORDER — SODIUM HYALURONATE 23 MG/ML IO SOLN
INTRAOCULAR | Status: DC | PRN
  Administered 2019-02-02: 0.6 mL via INTRAOCULAR

## 2019-02-02 MED ORDER — SODIUM HYALURONATE 10 MG/ML IO SOLN
INTRAOCULAR | Status: DC | PRN
  Administered 2019-02-02: 0.55 mL via INTRAOCULAR

## 2019-02-02 MED ORDER — ONDANSETRON HCL 4 MG/2ML IJ SOLN: 4.0000 mg | Freq: Once | INTRAMUSCULAR | Status: DC | PRN

## 2019-02-02 MED ORDER — LIDOCAINE HCL (PF) 2 % IJ SOLN
INTRAOCULAR | Status: DC | PRN
  Administered 2019-02-02: 10:00:00 4 mL via INTRAOCULAR

## 2019-02-02 SURGICAL SUPPLY — 22 items
CANNULA ANT/CHMB 27G (MISCELLANEOUS) ×2 IMPLANT
CANNULA ANT/CHMB 27GA (MISCELLANEOUS) ×4 IMPLANT
DEVICE INJECT ISTENT W (Stent) IMPLANT
DISSECTOR HYDRO NUCLEUS 50X22 (MISCELLANEOUS) ×2 IMPLANT
GLOVE SURG LX 7.5 STRW (GLOVE) ×1
GLOVE SURG LX STRL 7.5 STRW (GLOVE) ×1 IMPLANT
GLOVE SURG SYN 8.5  E (GLOVE) ×1
GLOVE SURG SYN 8.5 E (GLOVE) ×1 IMPLANT
GLOVE SURG SYN 8.5 PF PI (GLOVE) ×1 IMPLANT
GOWN STRL REUS W/ TWL LRG LVL3 (GOWN DISPOSABLE) ×2 IMPLANT
GOWN STRL REUS W/TWL LRG LVL3 (GOWN DISPOSABLE) ×2
ICLIP (OPHTHALMIC RELATED) ×1 IMPLANT
INJECT ISTENT W (Stent) ×2 IMPLANT
LENS IOL TECNIS ITEC 19.5 (Intraocular Lens) ×1 IMPLANT
MARKER SKIN DUAL TIP RULER LAB (MISCELLANEOUS) ×2 IMPLANT
PACK DR. KING ARMS (PACKS) ×2 IMPLANT
PACK EYE AFTER SURG (MISCELLANEOUS) ×2 IMPLANT
PACK OPTHALMIC (MISCELLANEOUS) ×2 IMPLANT
SYR 3ML LL SCALE MARK (SYRINGE) ×2 IMPLANT
SYR TB 1ML LUER SLIP (SYRINGE) ×2 IMPLANT
WATER STERILE IRR 250ML POUR (IV SOLUTION) ×2 IMPLANT
WIPE NON LINTING 3.25X3.25 (MISCELLANEOUS) ×2 IMPLANT

## 2019-02-02 NOTE — Anesthesia Postprocedure Evaluation (Signed)
Anesthesia Post Note  Patient: Scott Cortez  Procedure(s) Performed: CATARACT EXTRACTION PHACO AND INTRAOCULAR LENS PLACEMENT (IOC) LEFT ISTENT INJ AND ISTENT 01:09.1        11.3%          8.47 (Left Eye) INSERTION OF ANTERIOR SEGMENT AQUEOUS DRAINAGE DEVICE (ISTENT) (Left Eye)  Patient location during evaluation: PACU Anesthesia Type: MAC Level of consciousness: awake and alert Pain management: pain level controlled Vital Signs Assessment: post-procedure vital signs reviewed and stable Respiratory status: spontaneous breathing, nonlabored ventilation, respiratory function stable and patient connected to nasal cannula oxygen Cardiovascular status: stable and blood pressure returned to baseline Postop Assessment: no apparent nausea or vomiting Anesthetic complications: no    Reneta Niehaus A  Kylene Zamarron

## 2019-02-02 NOTE — H&P (Signed)
The History and Physical notes are on paper, have been signed, and are to be scanned.   I have examined the patient and there are no changes to the H&P.   Attestation: 1. The patient's impairment of visual function is believed not to be correctable with a tolerable change in glasses or contact lenses. 2. Cataract (in the operative eye) is believed to be significantly contributing to the patient's visual impairment. 3. The patient desires surgical correction; the risks, benefits and alternatives have been explained; and a reasonable expectation exists that lens surgery will significantly improve both the visual and functional status of the patient. 4.  The patient has mild open angle glaucoma, left eye which will benefit from an istent.  I certify the statements are true to the best of my knowledge.  Benay Pillow 02/02/2019 9:58 AM

## 2019-02-02 NOTE — Op Note (Signed)
OPERATIVE NOTE  JAWON DIPIERO [id number] 02/02/2019  PREOPERATIVE DIAGNOSIS:   1.  Mild open angle glaucoma, left eye. E36.6294  2.  Nuclear sclerotic cataract left eye.  H25.12   POSTOPERATIVE DIAGNOSIS:    same.   PROCEDURE:   1.  Placement of trabecular bypass stent (istent). CPT 0191T  and placement of additional stent  CPT 0376T 2.  Phacoemusification with posterior chamber intraocular lens placement of the right eye  CPT [phone]   LENS: Implant Name Type Inv. Item Serial No. Manufacturer Lot No. LRB No. Used Action  INJECT ISTENT - [id number] Stent INJECT ISTENT [id number] [employer] [id number] Left 1 Implanted  LENS IOL DIOP 19.5 - [id number] Intraocular Lens LENS IOL DIOP 19.5 [id number] AMO  Left 1 Implanted      Procedure(s): CATARACT EXTRACTION PHACO AND INTRAOCULAR LENS PLACEMENT (IOC) LEFT ISTENT INJ AND ISTENT 01:09.1        11.3%          8.47 (Left) INSERTION OF ANTERIOR SEGMENT AQUEOUS DRAINAGE DEVICE (ISTENT) (Left)    SURGEON:  Benay Pillow, MD, MPH  ANESTHESIOLOGIST: Anesthesiologist: Heniser, Fredric Dine, MD CRNA: Jeannene Patella, CRNA   ANESTHESIA:  MAC and intracameral preservative-free intracameral lidocaine 4%.  ESTIMATED BLOOD LOSS: less than 1 mL.   COMPLICATIONS:  None.   DESCRIPTION OF PROCEDURE:  The patient was identified in the holding room and transported to the operating room.  The patient was placed in the supine position under the operating microscope.  The left eye was prepped and draped in the usual sterile ophthalmic fashion.   A 1.0 millimeter clear-corneal paracentesis was made at the 4:30 position. 0.5 ml of preservative-free 1% lidocaine with epinephrine was injected into the anterior chamber.  The anterior chamber was filled with Healon 5 viscoelastic.  A 2.4 millimeter keratome was used to make a near-clear corneal incision at the 2:00 position.   Attention was turned to the istent.  The patients head was turned to  the left and the microscope was tilted to 035 degrees.  Ocular instruments/Glaukos OAL/H2 gonioprism was used with IPC05 (iclip) coupled with Healon 5 on the cornea was used to visualize the trabecular meshwork. The istent was opened and introduced into the eye.  The meshwork was engaged with the tip of the iStent injector and the stent was deployed into Schlemm's canal at 10:30.  The second stent was deployed at 8:00.  The stents were well seated and in good position.  Next, attention was turned to the phacoemulsification A curvilinear capsulorrhexis was made with a cystotome and capsulorrhexis forceps.  Balanced salt solution was used to hydrodissect and hydrodelineate the nucleus.   Phacoemulsification was then used in stop and chop fashion to remove the lens nucleus and epinucleus.  The remaining cortex was then removed using the irrigation and aspiration handpiece. Healon was then placed into the capsular bag to distend it for lens placement.  A lens was then injected into the capsular bag.  The remaining viscoelastic was aspirated.   Wounds were hydrated with balanced salt solution.  The anterior chamber was inflated to a physiologic pressure with balanced salt solution.   Intracameral vigamox 0.1 mL undiluted was injected into the eye and a drop placed onto the ocular surface.  No wound leaks were noted. The patient was taken to the recovery room in stable condition without complications of anesthesia or surgery   Benay Pillow 02/02/2019, 10:39 AM

## 2019-02-02 NOTE — Transfer of Care (Signed)
Immediate Anesthesia Transfer of Care Note  Patient: Scott Cortez  Procedure(s) Performed: CATARACT EXTRACTION PHACO AND INTRAOCULAR LENS PLACEMENT (IOC) LEFT ISTENT INJ AND ISTENT 01:09.1        11.3%          8.47 (Left Eye) INSERTION OF ANTERIOR SEGMENT AQUEOUS DRAINAGE DEVICE (ISTENT) (Left Eye)  Patient Location: PACU  Anesthesia Type: MAC  Level of Consciousness: awake, alert  and patient cooperative  Airway and Oxygen Therapy: Patient Spontanous Breathing and Patient connected to supplemental oxygen  Post-op Assessment: Post-op Vital signs reviewed, Patient's Cardiovascular Status Stable, Respiratory Function Stable, Patent Airway and No signs of Nausea or vomiting  Post-op Vital Signs: Reviewed and stable  Complications: No apparent anesthesia complications

## 2019-02-03 ENCOUNTER — Encounter: Payer: Self-pay | Admitting: Ophthalmology

## 2019-02-16 ENCOUNTER — Other Ambulatory Visit: Payer: Self-pay

## 2019-02-16 ENCOUNTER — Encounter: Payer: Self-pay | Admitting: *Deleted

## 2019-02-19 ENCOUNTER — Other Ambulatory Visit: Payer: Self-pay

## 2019-02-19 ENCOUNTER — Other Ambulatory Visit
Admission: RE | Admit: 2019-02-19 | Discharge: 2019-02-19 | Disposition: A | Discharge status: Home / Self Care | Payer: Medicare Other | Source: Ambulatory Visit | Attending: Ophthalmology | Admitting: Ophthalmology

## 2019-02-19 DIAGNOSIS — Z01812 Encounter for preprocedural laboratory examination: Secondary | ICD-10-CM | POA: Diagnosis present

## 2019-02-19 DIAGNOSIS — Z20828 Contact with and (suspected) exposure to other viral communicable diseases: Secondary | ICD-10-CM | POA: Diagnosis not present

## 2019-02-19 LAB — SARS CORONAVIRUS 2 (TAT 6-24 HRS): SARS Coronavirus 2: NEGATIVE

## 2019-02-19 NOTE — Discharge Instructions (Signed)
General Anesthesia, Adult, Care After °This sheet gives you information about how to care for yourself after your procedure. Your health care provider may also give you more specific instructions. If you have problems or questions, contact your health care provider. °What can I expect after the procedure? °After the procedure, the following side effects are common: °· Pain or discomfort at the IV site. °· Nausea. °· Vomiting. °· Sore throat. °· Trouble concentrating. °· Feeling cold or chills. °· Weak or tired. °· Sleepiness and fatigue. °· Soreness and body aches. These side effects can affect parts of the body that were not involved in surgery. °Follow these instructions at home: ° °For at least 24 hours after the procedure: °· Have a responsible adult stay with you. It is important to have someone help care for you until you are awake and alert. °· Rest as needed. °· Do not: °? Participate in activities in which you could fall or become injured. °? Drive. °? Use heavy machinery. °? Drink alcohol. °? Take sleeping pills or medicines that cause drowsiness. °? Make important decisions or sign legal documents. °? Take care of children on your own. °Eating and drinking °· Follow any instructions from your health care provider about eating or drinking restrictions. °· When you feel hungry, start by eating small amounts of foods that are soft and easy to digest (bland), such as toast. Gradually return to your regular diet. °· Drink enough fluid to keep your urine pale yellow. °· If you vomit, rehydrate by drinking water, juice, or clear broth. °General instructions °· If you have sleep apnea, surgery and certain medicines can increase your risk for breathing problems. Follow instructions from your health care provider about wearing your sleep device: °? Anytime you are sleeping, including during daytime naps. °? While taking prescription pain medicines, sleeping medicines, or medicines that make you drowsy. °· Return to  your normal activities as told by your health care provider. Ask your health care provider what activities are safe for you. °· Take over-the-counter and prescription medicines only as told by your health care provider. °· If you smoke, do not smoke without supervision. °· Keep all follow-up visits as told by your health care provider. This is important. °Contact a health care provider if: °· You have nausea or vomiting that does not get better with medicine. °· You cannot eat or drink without vomiting. °· You have pain that does not get better with medicine. °· You are unable to pass urine. °· You develop a skin rash. °· You have a fever. °· You have redness around your IV site that gets worse. °Get help right away if: °· You have difficulty breathing. °· You have chest pain. °· You have blood in your urine or stool, or you vomit blood. °Summary °· After the procedure, it is common to have a sore throat or nausea. It is also common to feel tired. °· Have a responsible adult stay with you for the first 24 hours after general anesthesia. It is important to have someone help care for you until you are awake and alert. °· When you feel hungry, start by eating small amounts of foods that are soft and easy to digest (bland), such as toast. Gradually return to your regular diet. °· Drink enough fluid to keep your urine pale yellow. °· Return to your normal activities as told by your health care provider. Ask your health care provider what activities are safe for you. °This information is not   intended to replace advice given to you by your health care provider. Make sure you discuss any questions you have with your health care provider. °Document Released: 08/06/2000 Document Revised: 05/03/2017 Document Reviewed: 12/14/2016 °Elsevier Patient Education © 2020 Elsevier Inc. °Cataract Surgery, Care After °This sheet gives you information about how to care for yourself after your procedure. Your health care provider may also  give you more specific instructions. If you have problems or questions, contact your health care provider. °What can I expect after the procedure? °After the procedure, it is common to have: °· Itching. °· Discomfort. °· Fluid discharge. °· Sensitivity to light and to touch. °· Bruising in or around the eye. °· Mild blurred vision. °Follow these instructions at home: °Eye care ° °· Do not touch or rub your eyes. °· Protect your eyes as told by your health care provider. You may be told to wear a protective eye shield or sunglasses. °· Do not put a contact lens into the affected eye or eyes until your health care provider approves. °· Keep the area around your eye clean and dry: °? Avoid swimming. °? Do not allow water to hit you directly in the face while showering. °? Keep soap and shampoo out of your eyes. °· Check your eye every day for signs of infection. Watch for: °? Redness, swelling, or pain. °? Fluid, blood, or pus. °? Warmth. °? A bad smell. °? Vision that is getting worse. °? Sensitivity that is getting worse. °Activity °· Do not drive for 24 hours if you were given a sedative during your procedure. °· Avoid strenuous activities, such as playing contact sports, for as long as told by your health care provider. °· Do not drive or use heavy machinery until your health care provider approves. °· Do not bend or lift heavy objects. Bending increases pressure in the eye. You can walk, climb stairs, and do light household chores. °· Ask your health care provider when you can return to work. If you work in a dusty environment, you may be advised to wear protective eyewear for a period of time. °General instructions °· Take or apply over-the-counter and prescription medicines only as told by your health care provider. This includes eye drops. °· Keep all follow-up visits as told by your health care provider. This is important. °Contact a health care provider if: °· You have increased bruising around your  eye. °· You have pain that is not helped with medicine. °· You have a fever. °· You have redness, swelling, or pain in your eye. °· You have fluid, blood, or pus coming from your incision. °· Your vision gets worse. °· Your sensitivity to light gets worse. °Get help right away if: °· You have sudden loss of vision. °· You see flashes of light or spots (floaters). °· You have severe eye pain. °· You develop nausea or vomiting. °Summary °· After your procedure, it is common to have itching, discomfort, bruising, fluid discharge, or sensitivity to light. °· Follow instructions from your health care provider about caring for your eye after the procedure. °· Do not rub your eye after the procedure. You may need to wear eye protection or sunglasses. Do not wear contact lenses. Keep the area around your eye clean and dry. °· Avoid activities that require a lot of effort. These include playing sports and lifting heavy objects. °· Contact a health care provider if you have increased bruising, pain that does not go away, or a fever. Get   help right away if you suddenly lose your vision, see flashes of light or spots, or have severe pain in the eye. °This information is not intended to replace advice given to you by your health care provider. Make sure you discuss any questions you have with your health care provider. °Document Released: 11/17/2004 Document Revised: 10/28/2017 Document Reviewed: 10/28/2017 °Elsevier Patient Education © 2020 Elsevier Inc. ° °

## 2019-02-23 ENCOUNTER — Ambulatory Visit: Payer: Medicare Other | Admitting: Anesthesiology

## 2019-02-23 ENCOUNTER — Other Ambulatory Visit: Payer: Self-pay

## 2019-02-23 ENCOUNTER — Ambulatory Visit
Admission: RE | Admit: 2019-02-23 | Discharge: 2019-02-23 | Disposition: A | Discharge status: Home / Self Care | Payer: Medicare Other | Attending: Ophthalmology | Admitting: Ophthalmology

## 2019-02-23 ENCOUNTER — Encounter
Admission: RE | Disposition: A | Discharge status: Home / Self Care | Payer: Self-pay | Source: Home / Self Care | Attending: Ophthalmology

## 2019-02-23 DIAGNOSIS — I2510 Atherosclerotic heart disease of native coronary artery without angina pectoris: Secondary | ICD-10-CM | POA: Diagnosis not present

## 2019-02-23 DIAGNOSIS — K219 Gastro-esophageal reflux disease without esophagitis: Secondary | ICD-10-CM | POA: Diagnosis not present

## 2019-02-23 DIAGNOSIS — I499 Cardiac arrhythmia, unspecified: Secondary | ICD-10-CM | POA: Insufficient documentation

## 2019-02-23 DIAGNOSIS — G20 Parkinson's disease: Secondary | ICD-10-CM | POA: Insufficient documentation

## 2019-02-23 DIAGNOSIS — I252 Old myocardial infarction: Secondary | ICD-10-CM | POA: Insufficient documentation

## 2019-02-23 DIAGNOSIS — I714 Abdominal aortic aneurysm, without rupture: Secondary | ICD-10-CM | POA: Insufficient documentation

## 2019-02-23 DIAGNOSIS — M1990 Unspecified osteoarthritis, unspecified site: Secondary | ICD-10-CM | POA: Insufficient documentation

## 2019-02-23 DIAGNOSIS — Z955 Presence of coronary angioplasty implant and graft: Secondary | ICD-10-CM | POA: Insufficient documentation

## 2019-02-23 DIAGNOSIS — Z951 Presence of aortocoronary bypass graft: Secondary | ICD-10-CM | POA: Insufficient documentation

## 2019-02-23 DIAGNOSIS — Z8711 Personal history of peptic ulcer disease: Secondary | ICD-10-CM | POA: Insufficient documentation

## 2019-02-23 DIAGNOSIS — Z887 Allergy status to serum and vaccine status: Secondary | ICD-10-CM | POA: Insufficient documentation

## 2019-02-23 DIAGNOSIS — M5127 Other intervertebral disc displacement, lumbosacral region: Secondary | ICD-10-CM | POA: Diagnosis not present

## 2019-02-23 DIAGNOSIS — H401111 Primary open-angle glaucoma, right eye, mild stage: Secondary | ICD-10-CM | POA: Diagnosis not present

## 2019-02-23 DIAGNOSIS — Z87891 Personal history of nicotine dependence: Secondary | ICD-10-CM | POA: Diagnosis not present

## 2019-02-23 DIAGNOSIS — E785 Hyperlipidemia, unspecified: Secondary | ICD-10-CM | POA: Insufficient documentation

## 2019-02-23 DIAGNOSIS — I110 Hypertensive heart disease with heart failure: Secondary | ICD-10-CM | POA: Diagnosis not present

## 2019-02-23 DIAGNOSIS — H2511 Age-related nuclear cataract, right eye: Secondary | ICD-10-CM | POA: Insufficient documentation

## 2019-02-23 HISTORY — PX: CATARACT EXTRACTION W/PHACO: SHX586

## 2019-02-23 SURGERY — PHACOEMULSIFICATION, CATARACT, WITH IOL INSERTION
Anesthesia: Monitor Anesthesia Care | Laterality: Right

## 2019-02-23 MED ORDER — ACETAMINOPHEN 325 MG PO TABS: 325.0000 mg | ORAL_TABLET | Freq: Once | ORAL | Status: DC

## 2019-02-23 MED ORDER — TETRACAINE HCL 0.5 % OP SOLN
1.0000 [drp] | OPHTHALMIC | Status: DC | PRN
  Administered 2019-02-23 (×3): 1 [drp] via OPHTHALMIC

## 2019-02-23 MED ORDER — MIDAZOLAM HCL 2 MG/2ML IJ SOLN
INTRAMUSCULAR | Status: DC | PRN
  Administered 2019-02-23: 2 mg via INTRAVENOUS

## 2019-02-23 MED ORDER — SODIUM HYALURONATE 10 MG/ML IO SOLN
INTRAOCULAR | Status: DC | PRN
  Administered 2019-02-23: 0.55 mL via INTRAOCULAR

## 2019-02-23 MED ORDER — LIDOCAINE HCL (PF) 2 % IJ SOLN
INTRAOCULAR | Status: DC | PRN
  Administered 2019-02-23: 1 mL via INTRAOCULAR

## 2019-02-23 MED ORDER — EPINEPHRINE PF 1 MG/ML IJ SOLN
INTRAOCULAR | Status: DC | PRN
  Administered 2019-02-23: 10:00:00 85 mL via OPHTHALMIC

## 2019-02-23 MED ORDER — ARMC OPHTHALMIC DILATING DROPS
1.0000 "application " | OPHTHALMIC | Status: DC | PRN
  Administered 2019-02-23 (×3): 1 via OPHTHALMIC

## 2019-02-23 MED ORDER — SODIUM HYALURONATE 23 MG/ML IO SOLN
INTRAOCULAR | Status: DC | PRN
  Administered 2019-02-23: 0.6 mL via INTRAOCULAR

## 2019-02-23 MED ORDER — MOXIFLOXACIN HCL 0.5 % OP SOLN
OPHTHALMIC | Status: DC | PRN
  Administered 2019-02-23: 0.2 mL via OPHTHALMIC

## 2019-02-23 MED ORDER — LACTATED RINGERS IV SOLN: INTRAVENOUS | Status: DC

## 2019-02-23 MED ORDER — ACETAMINOPHEN 160 MG/5ML PO SOLN: 325.0000 mg | Freq: Once | ORAL | Status: DC

## 2019-02-23 MED ORDER — FENTANYL CITRATE (PF) 100 MCG/2ML IJ SOLN
INTRAMUSCULAR | Status: DC | PRN
  Administered 2019-02-23: 50 ug via INTRAVENOUS

## 2019-02-23 SURGICAL SUPPLY — 21 items
CANNULA ANT/CHMB 27G (MISCELLANEOUS) ×2 IMPLANT
CANNULA ANT/CHMB 27GA (MISCELLANEOUS) ×4 IMPLANT
DEVICE INJECT ISTENT W (Stent) ×1 IMPLANT
DISSECTOR HYDRO NUCLEUS 50X22 (MISCELLANEOUS) ×2 IMPLANT
GLOVE SURG LX 7.5 STRW (GLOVE) ×1
GLOVE SURG LX STRL 7.5 STRW (GLOVE) ×1 IMPLANT
GLOVE SURG SYN 8.5  E (GLOVE) ×1
GLOVE SURG SYN 8.5 E (GLOVE) ×1 IMPLANT
GLOVE SURG SYN 8.5 PF PI (GLOVE) ×1 IMPLANT
GOWN STRL REUS W/ TWL LRG LVL3 (GOWN DISPOSABLE) ×2 IMPLANT
GOWN STRL REUS W/TWL LRG LVL3 (GOWN DISPOSABLE) ×2
INJECT ISTENT W (Stent) ×2 IMPLANT
LENS IOL TECNIS ITEC 19.5 (Intraocular Lens) ×1 IMPLANT
MARKER SKIN DUAL TIP RULER LAB (MISCELLANEOUS) ×2 IMPLANT
PACK DR. KING ARMS (PACKS) ×2 IMPLANT
PACK EYE AFTER SURG (MISCELLANEOUS) ×2 IMPLANT
PACK OPTHALMIC (MISCELLANEOUS) ×2 IMPLANT
SYR 3ML LL SCALE MARK (SYRINGE) ×2 IMPLANT
SYR TB 1ML LUER SLIP (SYRINGE) ×2 IMPLANT
WATER STERILE IRR 250ML POUR (IV SOLUTION) ×2 IMPLANT
WIPE NON LINTING 3.25X3.25 (MISCELLANEOUS) ×2 IMPLANT

## 2019-02-23 NOTE — Anesthesia Postprocedure Evaluation (Signed)
Anesthesia Post Note  Patient: Scott Cortez  Procedure(s) Performed: CATARACT EXTRACTION PHACO AND INTRAOCULAR LENS PLACEMENT (IOC) RIGHT ISTENT INJ 01:09.1  14.1%  9.78 (Right )  Patient location during evaluation: PACU Anesthesia Type: MAC Level of consciousness: awake and alert and oriented Pain management: satisfactory to patient Vital Signs Assessment: post-procedure vital signs reviewed and stable Respiratory status: spontaneous breathing, nonlabored ventilation and respiratory function stable Cardiovascular status: blood pressure returned to baseline and stable Postop Assessment: Adequate PO intake and No signs of nausea or vomiting Anesthetic complications: no    Raliegh Ip

## 2019-02-23 NOTE — H&P (Signed)

## 2019-02-23 NOTE — Transfer of Care (Signed)
Immediate Anesthesia Transfer of Care Note  Patient: Scott Cortez  Procedure(s) Performed: CATARACT EXTRACTION PHACO AND INTRAOCULAR LENS PLACEMENT (IOC) RIGHT ISTENT INJ 01:09.1  14.1%  9.78 (Right )  Patient Location: PACU  Anesthesia Type: MAC  Level of Consciousness: awake, alert  and patient cooperative  Airway and Oxygen Therapy: Patient Spontanous Breathing and Patient connected to supplemental oxygen  Post-op Assessment: Post-op Vital signs reviewed, Patient's Cardiovascular Status Stable, Respiratory Function Stable, Patent Airway and No signs of Nausea or vomiting  Post-op Vital Signs: Reviewed and stable  Complications: No apparent anesthesia complications

## 2019-02-23 NOTE — Op Note (Signed)
OPERATIVE NOTE  Scott Cortez [id number] 02/23/2019  PREOPERATIVE DIAGNOSIS:   1.  Mild PRIMARY open angle glaucoma, right eye. H40.1111  2.  Nuclear sclerotic cataract right eye.  H25.11   POSTOPERATIVE DIAGNOSIS:    same.   PROCEDURE:   1.  Placement of trabecular bypass stent (istent). CPT 0191T  and placement of additional stent  CPT 0376T 2.  Phacoemusification with posterior chamber intraocular lens placement of the right eye  CPT [phone]   LENS: Implant Name Type Inv. Item Serial No. Manufacturer Lot No. LRB No. Used Action  INJECT ISTENT - [id number] Stent INJECT ISTENT [id number] [employer]  Right 1 Implanted  LENS IOL DIOP 19.5 - [id number] Intraocular Lens LENS IOL DIOP 19.5 [id number] AMO  Right 1 Implanted      Procedure(s): CATARACT EXTRACTION PHACO AND INTRAOCULAR LENS PLACEMENT (IOC) RIGHT ISTENT INJ 01:09.1  14.1%  9.78 (Right)  PCB00 +19.5   SURGEON:  Benay Pillow, MD, MPH  ANESTHESIOLOGIST: Anesthesiologist: Ronelle Nigh, MD CRNA: Silvana Newness, CRNA   ANESTHESIA:  MAC and intracameral preservative-free lidocaine 4%.  ESTIMATED BLOOD LOSS: less than 1 mL.   COMPLICATIONS:  None.   DESCRIPTION OF PROCEDURE:  The patient was identified in the holding room and transported to the operating room.   The patient was placed in the supine position under the operating microscope.  The right eye was prepped and draped in the usual sterile ophthalmic fashion.   A 1.0 millimeter clear-corneal paracentesis was made at the 10:30 position. 0.5 ml of preservative-free 1% lidocaine with epinephrine was injected into the anterior chamber.  The anterior chamber was filled with Healon 5 viscoelastic.  A 2.4 millimeter keratome was used to make a near-clear corneal incision at the 8:00 position.   Attention was turned to the istent.  The patients head was turned to the left and the microscope was tilted to 035 degrees.  Ocular instruments/Glaukos OAL/H2  gonioprism was used with IPC05 (iclip) coupled with Healon 5 on the cornea was used to visualize the trabecular meshwork. The istent was opened and introduced into the eye.  The meshwork was engaged with the tip of the iStent injector and the stent was deployed into Schlemm's canal at 2:00.  The second stent was deployed at 4:00.  The stents were well seated and in good position.  Next, attention was turned to the phacoemulsification A curvilinear capsulorrhexis was made with a cystotome and capsulorrhexis forceps.  Balanced salt solution was used to hydrodissect and hydrodelineate the nucleus.   Phacoemulsification was then used in stop and chop fashion to remove the lens nucleus and epinucleus.  The remaining cortex was then removed using the irrigation and aspiration handpiece. Healon was then placed into the capsular bag to distend it for lens placement.  A lens was then injected into the capsular bag.  The remaining viscoelastic was aspirated.   Wounds were hydrated with balanced salt solution.  The anterior chamber was inflated to a physiologic pressure with balanced salt solution.   Intracameral vigamox 0.1 mL undiluted was injected into the eye and a drop placed onto the ocular surface.  No wound leaks were noted. The patient was taken to the recovery room in stable condition without complications of anesthesia or surgery   Benay Pillow 02/23/2019, 10:05 AM

## 2019-02-23 NOTE — Anesthesia Preprocedure Evaluation (Signed)
Anesthesia Evaluation  Patient identified by MRN, date of birth, ID band Patient awake    Reviewed: Allergy & Precautions, H&P , NPO status , Patient's Chart, lab work & pertinent test results  Airway Mallampati: II  TM Distance: >3 FB Neck ROM: full    Dental no notable dental hx.    Pulmonary former smoker,    Pulmonary exam normal breath sounds clear to auscultation       Cardiovascular hypertension, + CAD, + Past MI and +CHF  Normal cardiovascular exam Rhythm:regular Rate:Normal     Neuro/Psych    GI/Hepatic GERD  ,  Endo/Other    Renal/GU      Musculoskeletal   Abdominal   Peds  Hematology   Anesthesia Other Findings   Reproductive/Obstetrics                             Anesthesia Physical Anesthesia Plan  ASA: III  Anesthesia Plan: MAC   Post-op Pain Management:    Induction:   PONV Risk Score and Plan: 1 and Midazolam, TIVA and Treatment may vary due to age or medical condition  Airway Management Planned:   Additional Equipment:   Intra-op Plan:   Post-operative Plan:   Informed Consent: I have reviewed the patients History and Physical, chart, labs and discussed the procedure including the risks, benefits and alternatives for the proposed anesthesia with the patient or authorized representative who has indicated his/her understanding and acceptance.       Plan Discussed with: CRNA  Anesthesia Plan Comments:         Anesthesia Quick Evaluation

## 2019-02-24 ENCOUNTER — Encounter: Payer: Self-pay | Admitting: Ophthalmology

## 2019-03-07 ENCOUNTER — Other Ambulatory Visit: Payer: Self-pay

## 2019-03-07 ENCOUNTER — Inpatient Hospital Stay
Admission: EM | Admit: 2019-03-07 | Discharge: 2019-03-12 | DRG: 309 | Disposition: A | Discharge status: Home Health | Payer: Medicare Other | Attending: Internal Medicine | Admitting: Internal Medicine

## 2019-03-07 ENCOUNTER — Inpatient Hospital Stay: Payer: Medicare Other

## 2019-03-07 DIAGNOSIS — H409 Unspecified glaucoma: Secondary | ICD-10-CM | POA: Diagnosis present

## 2019-03-07 DIAGNOSIS — I480 Paroxysmal atrial fibrillation: Principal | ICD-10-CM | POA: Diagnosis present

## 2019-03-07 DIAGNOSIS — Z9842 Cataract extraction status, left eye: Secondary | ICD-10-CM | POA: Diagnosis not present

## 2019-03-07 DIAGNOSIS — Z9181 History of falling: Secondary | ICD-10-CM

## 2019-03-07 DIAGNOSIS — Z961 Presence of intraocular lens: Secondary | ICD-10-CM | POA: Diagnosis present

## 2019-03-07 DIAGNOSIS — I2510 Atherosclerotic heart disease of native coronary artery without angina pectoris: Secondary | ICD-10-CM | POA: Diagnosis present

## 2019-03-07 DIAGNOSIS — K861 Other chronic pancreatitis: Secondary | ICD-10-CM | POA: Diagnosis present

## 2019-03-07 DIAGNOSIS — R55 Syncope and collapse: Secondary | ICD-10-CM | POA: Diagnosis present

## 2019-03-07 DIAGNOSIS — Z23 Encounter for immunization: Secondary | ICD-10-CM | POA: Diagnosis present

## 2019-03-07 DIAGNOSIS — I252 Old myocardial infarction: Secondary | ICD-10-CM | POA: Diagnosis not present

## 2019-03-07 DIAGNOSIS — E785 Hyperlipidemia, unspecified: Secondary | ICD-10-CM | POA: Diagnosis present

## 2019-03-07 DIAGNOSIS — Z955 Presence of coronary angioplasty implant and graft: Secondary | ICD-10-CM

## 2019-03-07 DIAGNOSIS — I6529 Occlusion and stenosis of unspecified carotid artery: Secondary | ICD-10-CM

## 2019-03-07 DIAGNOSIS — Z9841 Cataract extraction status, right eye: Secondary | ICD-10-CM | POA: Diagnosis not present

## 2019-03-07 DIAGNOSIS — Z7902 Long term (current) use of antithrombotics/antiplatelets: Secondary | ICD-10-CM

## 2019-03-07 DIAGNOSIS — I5032 Chronic diastolic (congestive) heart failure: Secondary | ICD-10-CM | POA: Diagnosis present

## 2019-03-07 DIAGNOSIS — I110 Hypertensive heart disease with heart failure: Secondary | ICD-10-CM | POA: Diagnosis present

## 2019-03-07 DIAGNOSIS — N400 Enlarged prostate without lower urinary tract symptoms: Secondary | ICD-10-CM | POA: Diagnosis present

## 2019-03-07 DIAGNOSIS — Z7982 Long term (current) use of aspirin: Secondary | ICD-10-CM

## 2019-03-07 DIAGNOSIS — Z20828 Contact with and (suspected) exposure to other viral communicable diseases: Secondary | ICD-10-CM | POA: Diagnosis present

## 2019-03-07 DIAGNOSIS — K219 Gastro-esophageal reflux disease without esophagitis: Secondary | ICD-10-CM | POA: Diagnosis present

## 2019-03-07 DIAGNOSIS — Z87891 Personal history of nicotine dependence: Secondary | ICD-10-CM | POA: Diagnosis not present

## 2019-03-07 DIAGNOSIS — G629 Polyneuropathy, unspecified: Secondary | ICD-10-CM | POA: Diagnosis present

## 2019-03-07 DIAGNOSIS — R001 Bradycardia, unspecified: Secondary | ICD-10-CM | POA: Diagnosis present

## 2019-03-07 DIAGNOSIS — M5136 Other intervertebral disc degeneration, lumbar region: Secondary | ICD-10-CM | POA: Diagnosis present

## 2019-03-07 DIAGNOSIS — R531 Weakness: Secondary | ICD-10-CM

## 2019-03-07 DIAGNOSIS — R2681 Unsteadiness on feet: Secondary | ICD-10-CM | POA: Diagnosis present

## 2019-03-07 DIAGNOSIS — Z79899 Other long term (current) drug therapy: Secondary | ICD-10-CM

## 2019-03-07 DIAGNOSIS — R296 Repeated falls: Secondary | ICD-10-CM | POA: Diagnosis present

## 2019-03-07 DIAGNOSIS — Z887 Allergy status to serum and vaccine status: Secondary | ICD-10-CM | POA: Diagnosis not present

## 2019-03-07 DIAGNOSIS — I4510 Unspecified right bundle-branch block: Secondary | ICD-10-CM | POA: Diagnosis present

## 2019-03-07 LAB — CBC WITH DIFFERENTIAL/PLATELET
Abs Immature Granulocytes: 0.02 10*3/uL (ref 0.00–0.07)
Basophils Absolute: 0 10*3/uL (ref 0.0–0.1)
Basophils Relative: 1 %
Eosinophils Absolute: 0.2 10*3/uL (ref 0.0–0.5)
Eosinophils Relative: 4 %
HCT: 40.5 % (ref 39.0–52.0)
Hemoglobin: 13.7 g/dL (ref 13.0–17.0)
Immature Granulocytes: 0 %
Lymphocytes Relative: 16 %
Lymphs Abs: 0.8 10*3/uL (ref 0.7–4.0)
MCH: 30.8 pg (ref 26.0–34.0)
MCHC: 33.8 g/dL (ref 30.0–36.0)
MCV: 91 fL (ref 80.0–100.0)
Monocytes Absolute: 0.6 10*3/uL (ref 0.1–1.0)
Monocytes Relative: 12 %
Neutro Abs: 3.2 10*3/uL (ref 1.7–7.7)
Neutrophils Relative %: 67 %
Platelets: 174 10*3/uL (ref 150–400)
RBC: 4.45 MIL/uL (ref 4.22–5.81)
RDW: 12.9 % (ref 11.5–15.5)
WBC: 4.7 10*3/uL (ref 4.0–10.5)
nRBC: 0 % (ref 0.0–0.2)

## 2019-03-07 LAB — URINALYSIS, COMPLETE (UACMP) WITH MICROSCOPIC
Bacteria, UA: NONE SEEN
Bilirubin Urine: NEGATIVE
Glucose, UA: NEGATIVE mg/dL
Hgb urine dipstick: NEGATIVE
Ketones, ur: NEGATIVE mg/dL
Leukocytes,Ua: NEGATIVE
Nitrite: NEGATIVE
Protein, ur: NEGATIVE mg/dL
Specific Gravity, Urine: 1.013 (ref 1.005–1.030)
pH: 6 (ref 5.0–8.0)

## 2019-03-07 LAB — COMPREHENSIVE METABOLIC PANEL
ALT: 13 U/L (ref 0–44)
AST: 25 U/L (ref 15–41)
Albumin: 3.9 g/dL (ref 3.5–5.0)
Alkaline Phosphatase: 82 U/L (ref 38–126)
Anion gap: 10 (ref 5–15)
BUN: 13 mg/dL (ref 8–23)
CO2: 26 mmol/L (ref 22–32)
Calcium: 9.3 mg/dL (ref 8.9–10.3)
Chloride: 102 mmol/L (ref 98–111)
Creatinine, Ser: 1.09 mg/dL (ref 0.61–1.24)
GFR calc Af Amer: >60 mL/min (ref >60)
GFR calc non Af Amer: >60 mL/min (ref >60)
Glucose, Bld: 149 mg/dL — ABNORMAL HIGH (ref 70–99)
Potassium: 4 mmol/L (ref 3.5–5.1)
Sodium: 138 mmol/L (ref 135–145)
Total Bilirubin: 1.7 mg/dL — ABNORMAL HIGH (ref 0.3–1.2)
Total Protein: 6.7 g/dL (ref 6.5–8.1)

## 2019-03-07 LAB — TROPONIN I (HIGH SENSITIVITY)
Troponin I (High Sensitivity): 25 ng/L — ABNORMAL HIGH (ref <18)
Troponin I (High Sensitivity): 54 ng/L — ABNORMAL HIGH (ref <18)

## 2019-03-07 LAB — TSH: TSH: 1.689 u[IU]/mL (ref 0.350–4.500)

## 2019-03-07 MED ORDER — SILDENAFIL CITRATE 100 MG PO TABS: 100.0000 mg | ORAL_TABLET | ORAL | Status: DC

## 2019-03-07 MED ORDER — ENOXAPARIN SODIUM 40 MG/0.4ML ~~LOC~~ SOLN
40.0000 mg | SUBCUTANEOUS | Status: DC
  Administered 2019-03-08 – 2019-03-09 (×3): 40 mg via SUBCUTANEOUS
  Filled 2019-03-07 (×3): qty 0.4

## 2019-03-07 MED ORDER — AMLODIPINE BESYLATE 5 MG PO TABS
5.0000 mg | ORAL_TABLET | Freq: Every day | ORAL | Status: DC
  Administered 2019-03-08 – 2019-03-10 (×3): 5 mg via ORAL
  Filled 2019-03-07 (×3): qty 1

## 2019-03-07 MED ORDER — PANTOPRAZOLE SODIUM 40 MG PO TBEC
40.0000 mg | DELAYED_RELEASE_TABLET | Freq: Every day | ORAL | Status: DC
  Administered 2019-03-08 – 2019-03-12 (×5): 40 mg via ORAL
  Filled 2019-03-07 (×5): qty 1

## 2019-03-07 MED ORDER — LOSARTAN POTASSIUM 50 MG PO TABS
50.0000 mg | ORAL_TABLET | Freq: Every day | ORAL | Status: DC
  Administered 2019-03-08 – 2019-03-10 (×3): 50 mg via ORAL
  Filled 2019-03-07 (×3): qty 1

## 2019-03-07 MED ORDER — SODIUM CHLORIDE 0.9 % IV SOLN
Freq: Once | INTRAVENOUS | Status: AC
  Administered 2019-03-07: 16:00:00 via INTRAVENOUS

## 2019-03-07 MED ORDER — SODIUM CHLORIDE 0.9 % IV SOLN: Freq: Once | INTRAVENOUS | Status: DC

## 2019-03-07 MED ORDER — SODIUM CHLORIDE 0.9 % IV SOLN
INTRAVENOUS | Status: DC
  Administered 2019-03-08: 04:00:00 via INTRAVENOUS

## 2019-03-07 MED ORDER — ASPIRIN EC 325 MG PO TBEC
325.0000 mg | DELAYED_RELEASE_TABLET | Freq: Every day | ORAL | Status: DC
  Administered 2019-03-08 – 2019-03-10 (×4): 325 mg via ORAL
  Filled 2019-03-07 (×4): qty 1

## 2019-03-07 MED ORDER — ATORVASTATIN CALCIUM 20 MG PO TABS
20.0000 mg | ORAL_TABLET | Freq: Every day | ORAL | Status: DC
  Administered 2019-03-08 – 2019-03-12 (×5): 20 mg via ORAL
  Filled 2019-03-07 (×5): qty 1

## 2019-03-07 MED ORDER — TAMSULOSIN HCL 0.4 MG PO CAPS
0.4000 mg | ORAL_CAPSULE | Freq: Every day | ORAL | Status: DC
  Administered 2019-03-08 – 2019-03-12 (×5): 0.4 mg via ORAL
  Filled 2019-03-07 (×5): qty 1

## 2019-03-07 MED ORDER — FAMOTIDINE 20 MG PO TABS
20.0000 mg | ORAL_TABLET | Freq: Every day | ORAL | Status: DC
  Administered 2019-03-08 – 2019-03-11 (×5): 20 mg via ORAL
  Filled 2019-03-07 (×5): qty 1

## 2019-03-07 MED ORDER — SODIUM CHLORIDE 0.9% FLUSH
3.0000 mL | Freq: Two times a day (BID) | INTRAVENOUS | Status: DC
  Administered 2019-03-08 – 2019-03-12 (×10): 3 mL via INTRAVENOUS

## 2019-03-07 MED ORDER — CLOPIDOGREL BISULFATE 75 MG PO TABS
75.0000 mg | ORAL_TABLET | Freq: Every day | ORAL | Status: DC
  Administered 2019-03-08 – 2019-03-10 (×3): 75 mg via ORAL
  Filled 2019-03-07 (×3): qty 1

## 2019-03-07 MED ORDER — IOHEXOL 350 MG/ML SOLN
75.0000 mL | Freq: Once | INTRAVENOUS | Status: AC | PRN
  Administered 2019-03-07: 75 mL via INTRAVENOUS

## 2019-03-07 NOTE — H&P (Signed)
[facility] [place] [facility]   PATIENT NAME: Scott KindleLawrence Cortez    MR#:  [id number]  DATE OF BIRTH:  02/03/1934  DATE OF ADMISSION:  03/07/2019  PRIMARY CARE PHYSICIAN: [facility]   REQUESTING/REFERRING PHYSICIAN: Daryel NovemberJonathan, Williams, MD  CHIEF COMPLAINT:   Chief Complaint  Patient presents with  . Weakness    HISTORY OF PRESENT ILLNESS:  85 y.o. male with pertinent past medical history of CAD, MI, GERD, bladder neck obstruction, chronic pancreatitis, hyperlipidemia, hypertension, and bradycardia presenting to the ED with near syncope, dizziness and generalized weakness.  Patient states that he woke up at around 940 this morning to make breakfast, he was almost done with his breakfast when he suddenly felt a strange sensation in his head describing as " clogged up".  Patient states he felt very weak going from sitting to standing position and felt like if he did not sit down immediately he would have passed out.  Denies associated symptoms of visual disturbances, speech abnormality, focal motor or sensory deficit, nausea or vomiting, chest pain or headache.  He does report having a fall 2 weeks ago hitting his head but did not get this evaluated, he feels like there head sensation may be a concussion from his recent fall.  Given his symptoms and the fact that he lives by himself he decided to hit his button for help.  EMS arrived and patient was brought to the ED for further evaluation.  On arrival to the ED, he was afebrile with blood pressure 114/69 mm Hg and pulse rate 75 beats/min. There were no focal neurological deficits; he was alert and oriented x4, and he did not demonstrate any memory deficits.  Initial labs revealed slightly elevated bilirubin 1.7, glucose 149, troponin 25, repeat 54 with unremarkable CBC.  Urinalysis negative for UTI chest x-ray negative for active cardiopulmonary process.  PAST MEDICAL HISTORY:   Past Medical History:   Diagnosis Date  . Bladder neck obstruction   . Bradycardia   . CHF (congestive heart failure) (HCC)   . Chronic pancreatitis (HCC)   . Coronary artery disease   . Dental bridge present    upper - bilateral  . GERD (gastroesophageal reflux disease)   . Glaucoma   . Hyperlipidemia   . Hypertension   . Myocardial infarction (HCC)   . Neuropathy    feet    PAST SURGICAL HISTORY:   Past Surgical History:  Procedure Laterality Date  . CATARACT EXTRACTION W/PHACO Left 02/02/2019   Procedure: CATARACT EXTRACTION PHACO AND INTRAOCULAR LENS PLACEMENT (IOC) LEFT ISTENT INJ AND ISTENT 01:09.1        11.3%          8.47;  Surgeon: Nevada CraneKing, Bradley Mark, MD;  [facility];  Service: Ophthalmology;  Laterality: Left;  . CATARACT EXTRACTION W/PHACO Right 02/23/2019   Procedure: CATARACT EXTRACTION PHACO AND INTRAOCULAR LENS PLACEMENT (IOC) RIGHT ISTENT INJ 01:09.1  14.1%  9.78;  Surgeon: Nevada CraneKing, Bradley Mark, MD;  [facility];  Service: Ophthalmology;  Laterality: Right;  . COLONOSCOPY N/A 10/15/2014   Procedure: COLONOSCOPY;  Surgeon: Scot Junobert T Elliott, MD;  Location[facility]COPY;  Service: Endoscopy;  Laterality: N/A;  . CORONARY ANGIOPLASTY     3 stents  . coronary stenting     2007 (2), 2008 (1)  . ESOPHAGOGASTRODUODENOSCOPY (EGD) WITH PROPOFOL N/A 10/15/2014   Procedure: ESOPHAGOGASTRODUODENOSCOPY (EGD) WITH PROPOFOL;  Surgeon: Scot Junobert T Elliott, MD;  [facility];  Service:  Endoscopy;  Laterality: N/A;  . INSERTION OF ANTERIOR SEGMENT AQUEOUS DRAINAGE DEVICE (ISTENT) Left 02/02/2019   Procedure: INSERTION OF ANTERIOR SEGMENT AQUEOUS DRAINAGE DEVICE (ISTENT);  Surgeon: [name], MD;  Location: [place];  Service: Ophthalmology;  Laterality: Left;  . SAVORY DILATION N/A 10/15/2014   Procedure: SAVORY DILATION;  Surgeon: [name], MD;  Location: [facility] ENDOSCOPY;  Service: Endoscopy;  Laterality: N/A;    SOCIAL HISTORY:    Social History   Tobacco Use  . Smoking status: Former Smoker    Quit date: 1975    Years since quitting: 45.8  . Smokeless tobacco: Never Used  Substance Use Topics  . Alcohol use: No    FAMILY HISTORY:  No family history on file.  DRUG ALLERGIES:   Allergies  Allergen Reactions  . Pneumococcal Vaccines Swelling    REVIEW OF SYSTEMS:   Review of Systems  Constitutional: Negative for chills, fever, malaise/fatigue and weight loss.  HENT: Positive for tinnitus. Negative for congestion, hearing loss and sore throat.   Eyes: Negative for blurred vision and double vision.  Respiratory: Negative for cough, shortness of breath and wheezing.   Cardiovascular: Negative for chest pain, palpitations, orthopnea and leg swelling.  Gastrointestinal: Negative for abdominal pain, diarrhea, nausea and vomiting.  Genitourinary: Negative for dysuria and urgency.  Musculoskeletal: Negative for myalgias.  Skin: Negative for rash.  Neurological: Positive for dizziness and weakness. Negative for sensory change, speech change, focal weakness and headaches.  Psychiatric/Behavioral: Negative for depression.    MEDICATIONS AT HOME:   Prior to Admission medications   Medication Sig Start Date End Date Taking? Authorizing Provider  amLODipine (NORVASC) 5 MG tablet Take 5 mg by mouth daily.   Yes [provider]  aspirin 325 MG tablet Take 325 mg by mouth daily.   Yes [provider]  clopidogrel (PLAVIX) 75 MG tablet Take 75 mg by mouth daily.   Yes [provider]  losartan (COZAAR) 50 MG tablet Take 50 mg by mouth daily.   Yes [provider]  omeprazole (PRILOSEC) 20 MG capsule Take 20 mg by mouth daily.   Yes [provider]  sildenafil (VIAGRA) 100 MG tablet Take 100 mg by mouth as directed. 06/16/12  Yes [provider]  tamsulosin (FLOMAX) 0.4 MG CAPS capsule Take 0.4 mg by mouth daily.   Yes [provider]  atorvastatin  (LIPITOR) 20 MG tablet Take 20 mg by mouth daily.    [provider]  brimonidine (ALPHAGAN) 0.2 % ophthalmic solution Place 1 drop into both eyes 2 (two) times daily.    [provider]  latanoprost (XALATAN) 0.005 % ophthalmic solution Place 1 drop into both eyes every morning.    [provider]  ranitidine (ZANTAC) 150 MG capsule Take 150 mg by mouth daily.    [provider]  timolol (BETIMOL) 0.5 % ophthalmic solution Place 1 drop into both eyes 2 (two) times daily.    [provider]      VITAL SIGNS:  Blood pressure 125/78, pulse (!) 51, temperature 97.9 F (36.6 C), temperature source Oral, resp. rate 19, height 5\' 9"  (1.753 m), weight 78.9 kg, SpO2 95 %.  PHYSICAL EXAMINATION:   Physical Exam  GENERAL:  85 y.o.-year-old patient lying in the bed with no acute distress.  EYES: Pupils equal, round, reactive to light and accommodation. No scleral icterus. Extraocular muscles intact.  HEENT: Head atraumatic, normocephalic. Oropharynx and nasopharynx clear.  NECK:  Supple, no  jugular venous distention. No thyroid enlargement, no tenderness.  LUNGS: Normal breath sounds bilaterally, no wheezing, rales,rhonchi or crepitation. No use of accessory muscles of respiration.  CARDIOVASCULAR: S1, S2 normal. No murmurs, rubs, or gallops.  ABDOMEN: Soft, nontender, nondistended. Bowel sounds present. No organomegaly or mass.  EXTREMITIES: No pedal edema, cyanosis, or clubbing.  NEUROLOGIC: Cranial nerves II through XII are intact. Muscle strength 5/5 in all extremities. Sensation intact. Gait not checked.  PSYCHIATRIC: The patient is alert and oriented x 3.  SKIN: No obvious rash, lesion, or ulcer.   DATA REVIEWED:  LABORATORY PANEL:   CBC Recent Labs  Lab 03/07/19 1331  WBC 4.7  HGB 13.7  HCT 40.5  PLT 174   ------------------------------------------------------------------------------------------------------------------  Chemistries   Recent Labs  Lab 03/07/19 1331  NA 138  K 4.0  CL 102  CO2 26  GLUCOSE 149*  BUN 13  CREATININE 1.09  CALCIUM 9.3  AST 25  ALT 13  ALKPHOS 82  BILITOT 1.7*   ------------------------------------------------------------------------------------------------------------------  Cardiac Enzymes No results for input(s): TROPONINI in the last 168 hours. ------------------------------------------------------------------------------------------------------------------  RADIOLOGY:  No results found.  EKG:  EKG: Sinus rhythm with rate of 72 bpm, borderline long PR interval, wide QRS, normal axis, normal QT  IMPRESSION AND PLAN:   85 y.o. male with pertinent past medical history of CAD, MI, GERD, bladder neck obstruction, chronic pancreatitis, hyperlipidemia, hypertension, and bradycardia presenting to the ED with near syncope, dizziness and generalized weakness.  1. Near syncope -with associated symptoms of dizziness and generalized weakness Hx symptomatic bradycardia follows with Dr. Juliann Pares -Admit to telemetry unit -Check CTA head and neck given recent fall -Echocardiogram -Check TSH -UA -Cardiology consult  2. Elevated troponin -patient denies chest pain, no dynamic EKG changes -Trend troponin  3. Coronary Artery Disease - Hx of MI - ASA 325mg  PO daily - Clopidogrel 75mg  PO daily  - HTN, HLD, control as below  4. HLD  + Goal LDL<100 - Atorvastatin 20mg  PO qhs  5. HTN + Goal BP <130/80 - Continue losartan and amlodipine  6. GERD - Protonix  DVT prophylaxis with enoxaparin  All the records are reviewed and case discussed with ED provider. Management plans discussed with the patient, family and they are in agreement.  CODE STATUS: FULL  TOTAL TIME TAKING CARE OF THIS PATIENT: 50 minutes.    on 03/07/2019 at 8:43 PM  , DNP, FNP-BC [facility] Nurse Practitioner Between 7am to 6pm - Pager [phone]  After 6pm go to [email or web address]  - 03/09/2019  [facility]  Office  [phone]  CC: Primary care physician; [facility]

## 2019-03-07 NOTE — ED Notes (Signed)
Patient transported to CT 

## 2019-03-07 NOTE — ED Notes (Signed)
Dr Williams at bedside 

## 2019-03-07 NOTE — ED Triage Notes (Signed)
Pt arrives via EMS from home after having sudden onset of weakness- votals per EMS as follows 96 HR 182 CBG 98.3 temp initial bp of 95/65, EMS got it up to 120/67

## 2019-03-07 NOTE — ED Notes (Signed)
Date and time results received: 03/07/19 1813 (use smartphrase ".now" to insert current time)  Test: Troponin Critical Value: 29  Name of Provider Notified: Jimmye Norman  Orders Received? Or Actions Taken?: NA

## 2019-03-07 NOTE — ED Notes (Signed)
Pt given urinal.

## 2019-03-07 NOTE — ED Provider Notes (Signed)
[facility] Emergency Department Provider Note       Time seen: ----------------------------------------- 3:05 PM on 03/07/2019 -----------------------------------------   I have reviewed the triage vital signs and the nursing notes.  HISTORY   Chief Complaint Weakness    HPI Scott GourdLawrence J Sabol is a 85 y.o. male with a history of CHF, chronic pancreatitis, coronary artery disease, GERD, hyperlipidemia, hypertension, MI who presents to the ED for sudden onset of weakness.  Patient states he lives alone, felt weak and near syncopal.  His symptoms had resolved by the time he arrived to the ER.  Blood pressure was noted to be borderline low, he has no other complaints.  Past Medical History:  Diagnosis Date  . Bladder neck obstruction   . Bradycardia   . CHF (congestive heart failure) (HCC)   . Chronic pancreatitis (HCC)   . Coronary artery disease   . Dental bridge present    upper - bilateral  . GERD (gastroesophageal reflux disease)   . Glaucoma   . Hyperlipidemia   . Hypertension   . Myocardial infarction (HCC)   . Neuropathy    feet    Patient Active Problem List   Diagnosis Date Noted  . Nocturia 06/26/2016  . Lower abdominal pain 09/04/2015  . Erectile dysfunction 08/30/2014  . 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 Incomplete emptying of bladder 01/26/2014  . Increased frequency of urination 01/26/2014  . Slowing of urinary stream 01/26/2014    Past Surgical History:  Procedure Laterality Date  . CATARACT EXTRACTION W/PHACO Left 02/02/2019   Procedure: CATARACT EXTRACTION PHACO AND INTRAOCULAR LENS PLACEMENT (IOC) LEFT ISTENT INJ AND ISTENT 01:09.1        11.3%          8.47;  Surgeon: [place] CraneKing, Bradley Mark, MD;  [facility];  Service: Ophthalmology;  Laterality: Left;  . CATARACT EXTRACTION W/PHACO Right 02/23/2019   Procedure: CATARACT EXTRACTION PHACO AND INTRAOCULAR LENS PLACEMENT (IOC) RIGHT  ISTENT INJ 01:09.1  14.1%  9.78;  Surgeon: Nevada CraneKing, Bradley Mark, MD;  [facility];  Service: Ophthalmology;  Laterality: Right;  . COLONOSCOPY N/A 10/15/2014   Procedure: COLONOSCOPY;  Surgeon: Scot Junobert T Elliott, MD;  [facility];  Service: Endoscopy;  Laterality: N/A;  . CORONARY ANGIOPLASTY     3 stents  . coronary stenting     2007 (2), 2008 (1)  . ESOPHAGOGASTRODUODENOSCOPY (EGD) WITH PROPOFOL N/A 10/15/2014   Procedure: ESOPHAGOGASTRODUODENOSCOPY (EGD) WITH PROPOFOL;  Surgeon: Scot Junobert T Elliott, MD;  [facility];  Service: Endoscopy;  Laterality: N/A;  . INSERTION OF ANTERIOR SEGMENT AQUEOUS DRAINAGE DEVICE (ISTENT) Left 02/02/2019   Procedure: INSERTION OF ANTERIOR SEGMENT AQUEOUS DRAINAGE DEVICE (ISTENT);  Surgeon: Nevada CraneKing, Bradley Mark, MD;  [facility];  Service: Ophthalmology;  Laterality: Left;  . SAVORY DILATION N/A 10/15/2014   Procedure: SAVORY DILATION;  Surgeon: Scot Junobert T Elliott, MD;  [facility];  Service: Endoscopy;  Laterality: N/A;    Allergies Pneumococcal vaccines  Social History Social History   Tobacco Use  . Smoking status: Former Smoker    Quit date: 1975    Years since quitting: 45.8  . Smokeless tobacco: Never Used  Substance Use Topics  . Alcohol use: No  . Drug use: Not Currently    Types: Marijuana    Comment: none since 2019   Review of Systems Constitutional: Negative for fever. Cardiovascular: Negative for chest pain. Respiratory: Negative for shortness of breath. Gastrointestinal: Negative  for abdominal pain, vomiting and diarrhea. Musculoskeletal: Negative for back pain. Skin: Negative for rash. Neurological: Positive for weakness  All systems negative/normal/unremarkable except as stated in the HPI  ____________________________________________   PHYSICAL EXAM:  VITAL SIGNS: ED Triage Vitals  Enc Vitals Group     BP 03/07/19 1328 114/69     Pulse Rate 03/07/19 1328 75      Resp 03/07/19 1330 16     Temp 03/07/19 1334 97.9 F (36.6 C)     Temp Source 03/07/19 1334 Oral     SpO2 03/07/19 1328 96 %     Weight 03/07/19 1328 174 lb (78.9 kg)     Height 03/07/19 1328 5\' 9"  (1.753 m)     Head Circumference --      Peak Flow --      Pain Score 03/07/19 1328 0     Pain Loc --      Pain Edu? --      Excl. in [place]? --     Constitutional: Alert and oriented. Well appearing and in no distress. Eyes: Conjunctivae are normal. Normal extraocular movements. ENT      Head: Normocephalic and atraumatic.      Nose: No congestion/rhinnorhea.      Mouth/Throat: Mucous membranes are moist.      Neck: No stridor. Cardiovascular: Normal rate, regular rhythm. No murmurs, rubs, or gallops. Respiratory: Normal respiratory effort without tachypnea nor retractions. Breath sounds are clear and equal bilaterally. No wheezes/rales/rhonchi. Gastrointestinal: Soft and nontender. Normal bowel sounds Musculoskeletal: Nontender with normal range of motion in extremities. No lower extremity tenderness nor edema. Neurologic:  Normal speech and language. No gross focal neurologic deficits are appreciated.  Skin:  Skin is warm, dry and intact. No rash noted. Psychiatric: Mood and affect are normal. Speech and behavior are normal.  ____________________________________________  EKG: Interpreted by me.  Sinus rhythm with rate of 72 bpm, borderline long PR interval, wide QRS, normal axis, normal QT  ____________________________________________  ED COURSE:  As part of my medical decision making, I reviewed the following data within the [place] History obtained from family if available, nursing notes, old chart and ekg, as well as notes from prior ED visits. Patient presented for weakness, we will assess with labs and imaging as indicated at this time.   Procedures  JARMEL LINHARDT was evaluated in Emergency Department on 03/07/2019 for the symptoms described in the  history of present illness. He was evaluated in the context of the global COVID-19 pandemic, which necessitated consideration that the patient might be at risk for infection with the SARS-CoV-2 virus that causes COVID-19. Institutional protocols and algorithms that pertain to the evaluation of patients at risk for COVID-19 are in a state of rapid change based on information released by regulatory bodies including the CDC and federal and state organizations. These policies and algorithms were followed during the patient's care in the ED.  ____________________________________________   LABS (pertinent positives/negatives)  Labs Reviewed  COMPREHENSIVE METABOLIC PANEL - Abnormal; Notable for the following components:      Result Value   Glucose, Bld 149 (*)    Total Bilirubin 1.7 (*)    All other components within normal limits  URINALYSIS, COMPLETE (UACMP) WITH MICROSCOPIC - Abnormal; Notable for the following components:   Color, Urine YELLOW (*)    APPearance CLEAR (*)    All other components within normal limits  TROPONIN I (HIGH SENSITIVITY) - Abnormal; Notable for the following components:  Troponin I (High Sensitivity) 25 (*)    All other components within normal limits  TROPONIN I (HIGH SENSITIVITY) - Abnormal; Notable for the following components:   Troponin I (High Sensitivity) 54 (*)    All other components within normal limits  CBC WITH DIFFERENTIAL/PLATELET   ____________________________________________   DIFFERENTIAL DIAGNOSIS   Dehydration, electrolyte abnormality, arrhythmia, MI, PE  FINAL ASSESSMENT AND PLAN  Weakness, elevated troponin   Plan: The patient had presented for sudden onset of weakness that has resolved. Patient's labs initially revealed a troponin of 25 of which was not all that concerning, however on repeat it had doubled to 54 which will require further work-up.  Patient's initial concern was possibility of having a heart attack although he was not  having any chest pain.  I will discuss with the hospitalist for admission.   Ulice Dash, MD    Note: This note was generated in part or whole with voice recognition software. Voice recognition is usually quite accurate but there are transcription errors that can and very often do occur. I apologize for any typographical errors that were not detected and corrected.     Emily Filbert, MD 03/07/19 [id number]

## 2019-03-07 NOTE — ED Notes (Signed)
Report given to Maureen, RN.

## 2019-03-08 ENCOUNTER — Inpatient Hospital Stay: Payer: Medicare Other

## 2019-03-08 ENCOUNTER — Inpatient Hospital Stay (HOSPITAL_COMMUNITY)
Admit: 2019-03-08 | Discharge: 2019-03-08 | Disposition: A | Discharge status: Home / Self Care | Payer: Medicare Other | Attending: Nurse Practitioner | Admitting: Nurse Practitioner

## 2019-03-08 DIAGNOSIS — R55 Syncope and collapse: Secondary | ICD-10-CM

## 2019-03-08 LAB — BASIC METABOLIC PANEL
Anion gap: 9 (ref 5–15)
BUN: 11 mg/dL (ref 8–23)
CO2: 26 mmol/L (ref 22–32)
Calcium: 9.1 mg/dL (ref 8.9–10.3)
Chloride: 104 mmol/L (ref 98–111)
Creatinine, Ser: 0.65 mg/dL (ref 0.61–1.24)
GFR calc Af Amer: >60 mL/min (ref >60)
GFR calc non Af Amer: >60 mL/min (ref >60)
Glucose, Bld: 110 mg/dL — ABNORMAL HIGH (ref 70–99)
Potassium: 3.8 mmol/L (ref 3.5–5.1)
Sodium: 139 mmol/L (ref 135–145)

## 2019-03-08 LAB — ECHOCARDIOGRAM COMPLETE
Height: 69 in
Weight: 2852.8 oz

## 2019-03-08 LAB — TROPONIN I (HIGH SENSITIVITY)
Troponin I (High Sensitivity): 50 ng/L — ABNORMAL HIGH (ref <18)
Troponin I (High Sensitivity): 44 ng/L — ABNORMAL HIGH (ref <18)

## 2019-03-08 LAB — CBC
HCT: 39 % (ref 39.0–52.0)
Hemoglobin: 13.1 g/dL (ref 13.0–17.0)
MCH: 30.6 pg (ref 26.0–34.0)
MCHC: 33.6 g/dL (ref 30.0–36.0)
MCV: 91.1 fL (ref 80.0–100.0)
Platelets: 180 10*3/uL (ref 150–400)
RBC: 4.28 MIL/uL (ref 4.22–5.81)
RDW: 13 % (ref 11.5–15.5)
WBC: 4.7 10*3/uL (ref 4.0–10.5)
nRBC: 0 % (ref 0.0–0.2)

## 2019-03-08 LAB — SARS CORONAVIRUS 2 (TAT 6-24 HRS): SARS Coronavirus 2: NEGATIVE

## 2019-03-08 MED ORDER — INFLUENZA VAC A&B SA ADJ QUAD 0.5 ML IM PRSY
0.5000 mL | PREFILLED_SYRINGE | INTRAMUSCULAR | Status: AC
  Administered 2019-03-09: 0.5 mL via INTRAMUSCULAR
  Filled 2019-03-08: qty 0.5

## 2019-03-08 NOTE — ED Notes (Signed)
Report called to floor

## 2019-03-08 NOTE — ED Notes (Signed)
Pt moved from stretcher to hospital bed; was able to shuffle slowly, assisted x 1, to bench in room to sit while beds switched; pt very appreciative; pt given something to eat-grapes, celery, carrots, apple sauce and crackers with peanut butter; pt does not eat meat; will note that on his diet order for the floor;

## 2019-03-08 NOTE — Consult Note (Signed)
[facility] Cardiology Consultation Note  Patient ID: Scott Cortez, MRN: [id number], DOB/AGE: April 23, 1934 85 y.o. Admit date: 03/07/2019   Date of Consult: 03/08/2019 Primary Physician: [facility], [facility] Primary Cardiologist: Call would  Chief Complaint:  Chief Complaint  Patient presents with  . Weakness   Reason for Consult: Weakness with cardiovascular disease  HPI: 85 y.o. male with known coronary disease status post apparent previous myocardial infarction and PCI and stent placement multiple arteries hypertension hyperlipidemia previously on appropriate medication management with an episode of weakness and presyncope for which occurred spontaneously.  The patient had concerns about this and had need to be seen in the emergency room.  The patient did not have any true syncope chest pain or congestive heart failure type symptoms.  When arriving at the emergency room the patient had an EKG showing normal sinus rhythm with incomplete right bundle branch block.  Telemetry since then has shown sinus bradycardia but no evidence of advanced heart block troponin levels are 54 and stable without evidence of acute coronary syndrome.  Echocardiogram has shown normal LV systolic function with ejection fraction of 60% and minimal valvular heart disease.  The patient has not had any other significant symptoms signifying the primary cause of these symptoms  Past Medical History:  Diagnosis Date  . Bladder neck obstruction   . Bradycardia   . CHF (congestive heart failure) (HCC)   . Chronic pancreatitis (HCC)   . Coronary artery disease   . Dental bridge present    upper - bilateral  . GERD (gastroesophageal reflux disease)   . Glaucoma   . Hyperlipidemia   . Hypertension   . Myocardial infarction (HCC)   . Neuropathy    feet      Surgical History:  Past Surgical History:  Procedure Laterality Date  . CATARACT EXTRACTION W/PHACO Left 02/02/2019   Procedure: CATARACT  EXTRACTION PHACO AND INTRAOCULAR LENS PLACEMENT (IOC) LEFT ISTENT INJ AND ISTENT 01:09.1        11.3%          8.47;  Surgeon: Nevada Crane, MD;  Location: [facility];  Service: Ophthalmology;  Laterality: Left;  . CATARACT EXTRACTION W/PHACO Right 02/23/2019   Procedure: CATARACT EXTRACTION PHACO AND INTRAOCULAR LENS PLACEMENT (IOC) RIGHT ISTENT INJ 01:09.1  14.1%  9.78;  Surgeon: Nevada Crane, MD;  Location: [facility];  Service: Ophthalmology;  Laterality: Right;  . COLONOSCOPY N/A 10/15/2014   Procedure: COLONOSCOPY;  Surgeon: Scot Jun, MD;  Location: [facility] ENDOSCOPY;  Service: Endoscopy;  Laterality: N/A;  . CORONARY ANGIOPLASTY     3 stents  . coronary stenting     2007 (2), 2008 (1)  . ESOPHAGOGASTRODUODENOSCOPY (EGD) WITH PROPOFOL N/A 10/15/2014   Procedure: ESOPHAGOGASTRODUODENOSCOPY (EGD) WITH PROPOFOL;  Surgeon: Scot Jun, MD;  Location: [facility] ENDOSCOPY;  Service: Endoscopy;  Laterality: N/A;  . INSERTION OF ANTERIOR SEGMENT AQUEOUS DRAINAGE DEVICE (ISTENT) Left 02/02/2019   Procedure: INSERTION OF ANTERIOR SEGMENT AQUEOUS DRAINAGE DEVICE (ISTENT);  Surgeon: Nevada Crane, MD;  Location: [facility];  Service: Ophthalmology;  Laterality: Left;  . SAVORY DILATION N/A 10/15/2014   Procedure: SAVORY DILATION;  Surgeon: Scot Jun, MD;  Location: [facility] ENDOSCOPY;  Service: Endoscopy;  Laterality: N/A;     Home Meds: Prior to Admission medications   Medication Sig Start Date End Date Taking? Authorizing Provider  amLODipine (NORVASC) 5 MG tablet Take 5 mg by mouth daily.   Yes [provider]  aspirin 325 MG tablet Take 325 mg by mouth daily.   Yes [provider]  clopidogrel (PLAVIX) 75 MG tablet Take 75 mg by mouth daily.   Yes [provider]  losartan (COZAAR) 50 MG tablet Take 50 mg by mouth daily.   Yes [provider]  omeprazole (PRILOSEC) 20 MG capsule Take 20 mg by mouth daily.   Yes  [provider]  sildenafil (VIAGRA) 100 MG tablet Take 100 mg by mouth as directed. 06/16/12  Yes [provider]  tamsulosin (FLOMAX) 0.4 MG CAPS capsule Take 0.4 mg by mouth daily.   Yes [provider]  atorvastatin (LIPITOR) 20 MG tablet Take 20 mg by mouth daily.    [provider]  brimonidine (ALPHAGAN) 0.2 % ophthalmic solution Place 1 drop into both eyes 2 (two) times daily.    [provider]  latanoprost (XALATAN) 0.005 % ophthalmic solution Place 1 drop into both eyes every morning.    [provider]  ranitidine (ZANTAC) 150 MG capsule Take 150 mg by mouth daily.    [provider]  timolol (BETIMOL) 0.5 % ophthalmic solution Place 1 drop into both eyes 2 (two) times daily.    [provider]    Inpatient Medications:  . amLODipine  5 mg Oral Daily  . aspirin EC  325 mg Oral Daily  . atorvastatin  20 mg Oral Daily  . clopidogrel  75 mg Oral Daily  . enoxaparin (LOVENOX) injection  40 mg Subcutaneous Q24H  . famotidine  20 mg Oral QHS  . [START ON 03/09/2019] influenza vaccine adjuvanted  0.5 mL Intramuscular Tomorrow-1000  . losartan  50 mg Oral Daily  . pantoprazole  40 mg Oral Daily  . sodium chloride flush  3 mL Intravenous Q12H  . tamsulosin  0.4 mg Oral Daily   . sodium chloride Stopped (03/08/19 1346)    Allergies:  Allergies  Allergen Reactions  . Pneumococcal Vaccines Swelling    Social History   Socioeconomic History  . Marital status: Single    Spouse name: Not on file  . Number of children: Not on file  . Years of education: Not on file  . Highest education level: Not on file  Occupational History  . Not on file  Social Needs  . Financial resource strain: Not on file  . Food insecurity    Worry: Not on file    Inability: Not on file  . Transportation needs    Medical: Not on file    Non-medical: Not on file  Tobacco Use  . Smoking status: Former Smoker    Quit date: 1975     Years since quitting: 45.8  . Smokeless tobacco: Never Used  Substance and Sexual Activity  . Alcohol use: No  . Drug use: Not Currently    Types: Marijuana    Comment: none since 2019  . Sexual activity: Yes    Birth control/protection: None  Lifestyle  . Physical activity    Days per week: Not on file    Minutes per session: Not on file  . Stress: Not on file  Relationships  . Social Herbalist on phone: Not on file    Gets together: Not on file    Attends religious service: Not on file    Active member of club or organization: Not on file    Attends meetings of clubs or organizations: Not on file    Relationship status: Not on file  .  Intimate partner violence    Fear of current or ex partner: Not on file    Emotionally abused: Not on file    Physically abused: Not on file    Forced sexual activity: Not on file  Other Topics Concern  . Not on file  Social History Narrative  . Not on file     No family history on file.   Review of Systems Positive for presyncope dizziness Negative for: General:  chills, fever, night sweats or weight changes.  Cardiovascular: PND orthopnea positive for syncope dizziness  Dermatological skin lesions rashes Respiratory: Cough congestion Urologic: Frequent urination urination at night and hematuria Abdominal: negative for nausea, vomiting, diarrhea, bright red blood per rectum, melena, or hematemesis Neurologic: negative for visual changes, and/or hearing changes  All other systems reviewed and are otherwise negative except as noted above.  Labs: No results for input(s): CKTOTAL, CKMB, TROPONINI in the last 72 hours. Lab Results  Component Value Date   WBC 4.7 03/08/2019   HGB 13.1 03/08/2019   HCT 39.0 03/08/2019   MCV 91.1 03/08/2019   PLT 180 03/08/2019    Recent Labs  Lab 03/07/19 1331 03/08/19 0531  NA 138 139  K 4.0 3.8  CL 102 104  CO2 26 26  BUN 13 11  CREATININE 1.09 0.65  CALCIUM 9.3 9.1  PROT  6.7  --   BILITOT 1.7*  --   ALKPHOS 82  --   ALT 13  --   AST 25  --   GLUCOSE 149* 110*   No results found for: CHOL, HDL, LDLCALC, TRIG No results found for: DDIMER  Radiology/Studies:  Ct Angio Head W Or Wo Contrast  Result Date: 03/07/2019 CLINICAL DATA:  Syncope/fainting, cardiac etiology suspected. Sudden onset of weakness today. EXAM: CT ANGIOGRAPHY HEAD AND NECK TECHNIQUE: Multidetector CT imaging of the head and neck was performed using the standard protocol during bolus administration of intravenous contrast. Multiplanar CT image reconstructions and MIPs were obtained to evaluate the vascular anatomy. Carotid stenosis measurements (when applicable) are obtained utilizing NASCET criteria, using the distal internal carotid diameter as the denominator. CONTRAST:  75mL OMNIPAQUE IOHEXOL 350 MG/ML SOLN COMPARISON:  Head CT 01/09/2016 FINDINGS: CT HEAD FINDINGS Brain: New from prior head CT 01/09/2016, there is a chronic appearing cortical/subcortical infarct within the anterolateral left frontal lobe. No other demarcated cortical infarction is identified. No evidence of intracranial mass. No midline shift or extra-axial fluid collection. Ill-defined hypoattenuation of the cerebral white matter consistent with chronic small vessel ischemic disease. Moderate generalized parenchymal atrophy. Vascular: Reported separately Skull: Normal. Negative for fracture or focal lesion. Sinuses: Mild ethmoid sinus mucosal thickening. No significant mastoid effusion. Orbits: Visualized orbits demonstrate no acute abnormality. Review of the MIP images confirms the above findings CTA NECK FINDINGS Aortic arch: The left vertebral artery arises directly from the aortic arch. Scattered soft and calcified plaque within the visualized aortic arch and proximal major branch vessels of the neck. The visualized aortic arch is ectatic measuring up to 3.5 cm in diameter. Right carotid system: Common carotid artery widely  patent. Soft and calcified plaque at the carotid bifurcation and within the proximal right ICA with less than 50% narrowing of the proximal ICA. Distal to this, the ICA is widely patent within the neck without stenosis. Left carotid system: Common carotid artery widely patent with scattered calcified plaque. Predominantly calcified plaque at the carotid bifurcation and within the proximal left ICA. It is difficult to accurately quantify the degree  of stenosis at the origin of the ICA due to prominent irregular calcified plaque. There may be greater than 50% stenosis at this site. Distal to this, the ICA is widely patent within the neck with mild scattered calcified plaque. Vertebral arteries: Right vertebral artery slightly dominant. Mixed plaque at the origin of the right vertebral artery with suspected mild/moderate ostial stenosis. Distal to this, the right vertebral artery is widely patent within the neck with only mild scattered calcified plaque. Calcified plaque at the origin of the left vertebral artery with suspected at least moderate ostial stenosis. Distal to this, the left vertebral artery is widely patent within the neck. Skeleton: No acute bony abnormality. Cervical spondylosis with multilevel posterior disc osteophytes, uncovertebral and facet hypertrophy. No high-grade bony spinal canal stenosis. Other neck: No soft tissue neck mass or pathologically enlarged cervical chain lymph nodes. Upper chest: No consolidation within the imaged lung apices Review of the MIP images confirms the above findings CTA HEAD FINDINGS Anterior circulation: Calcified plaque within the intracranial internal carotid arteries bilaterally. Sites of up to moderate stenosis within the cavernous right ICA. No more than mild stenosis within the left ICA. The right middle and anterior cerebral arteries are patent without significant proximal stenosis. The left middle and anterior cerebral arteries are patent without significant  proximal stenosis. No intracranial aneurysm is identified. Posterior circulation: The intracranial vertebral arteries are patent without significant stenosis. Mild calcified plaque within the intracranial right vertebral artery. The basilar artery is patent without significant stenosis. Minimal calcified plaque within the proximal basilar artery. The bilateral posterior cerebral arteries are patent without significant proximal stenosis. Venous sinuses: Within limitations of contrast timing, no convincing thrombus. Anatomic variants: Posterior communicating arteries are poorly delineated bilaterally and may be hypoplastic or absent. Review of the MIP images confirms the above findings IMPRESSION: CT head: 1. 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 MRI may be obtained for confirmation, if clinically warranted. 2. Generalized parenchymal atrophy with chronic small vessel ischemic disease. CTA head: 1. No intracranial large vessel occlusion. 2. Intracranial atherosclerotic disease as described. Most notably, there is calcified plaque within the carotid artery siphons bilaterally with sites of up to moderate stenosis within the cavernous right ICA. No more than mild narrowing within the left ICA. CTA neck: 1. Right common and internal carotid arteries patent within the neck without significant stenosis. Mixed plaque results in less than 50% narrowing of the proximal right ICA. 2. Left common and internal carotid arteries patent within the neck. It is difficult to accurately quantify the degree of stenosis at the origin of the left internal carotid artery due to the presence of prominent irregular calcified plaque. There may be greater than 50% stenosis at this site. Consider carotid artery duplex for further evaluation. 3. Bilateral vertebral arteries patent within the neck. Plaque at the vertebral artery origins with suspected  mild/moderate right ostial stenosis and at least moderate left ostial stenosis. Electronically Signed   By: Jackey LogeKyle  Golden DO   On: 03/07/2019 22:01   Ct Angio Neck W Or Wo Contrast  Result Date: 03/07/2019 CLINICAL DATA:  Syncope/fainting, cardiac etiology suspected. Sudden onset of weakness today. EXAM: CT ANGIOGRAPHY HEAD AND NECK TECHNIQUE: Multidetector CT imaging of the head and neck was performed using the standard protocol during bolus administration of intravenous contrast. Multiplanar CT image reconstructions and MIPs were obtained to evaluate the vascular anatomy. Carotid stenosis measurements (when applicable) are obtained utilizing NASCET criteria,  using the distal internal carotid diameter as the denominator. CONTRAST:  75mL OMNIPAQUE IOHEXOL 350 MG/ML SOLN COMPARISON:  Head CT 01/09/2016 FINDINGS: CT HEAD FINDINGS Brain: New from prior head CT 01/09/2016, there is a chronic appearing cortical/subcortical infarct within the anterolateral left frontal lobe. No other demarcated cortical infarction is identified. No evidence of intracranial mass. No midline shift or extra-axial fluid collection. Ill-defined hypoattenuation of the cerebral white matter consistent with chronic small vessel ischemic disease. Moderate generalized parenchymal atrophy. Vascular: Reported separately Skull: Normal. Negative for fracture or focal lesion. Sinuses: Mild ethmoid sinus mucosal thickening. No significant mastoid effusion. Orbits: Visualized orbits demonstrate no acute abnormality. Review of the MIP images confirms the above findings CTA NECK FINDINGS Aortic arch: The left vertebral artery arises directly from the aortic arch. Scattered soft and calcified plaque within the visualized aortic arch and proximal major branch vessels of the neck. The visualized aortic arch is ectatic measuring up to 3.5 cm in diameter. Right carotid system: Common carotid artery widely patent. Soft and calcified plaque at the carotid  bifurcation and within the proximal right ICA with less than 50% narrowing of the proximal ICA. Distal to this, the ICA is widely patent within the neck without stenosis. Left carotid system: Common carotid artery widely patent with scattered calcified plaque. Predominantly calcified plaque at the carotid bifurcation and within the proximal left ICA. It is difficult to accurately quantify the degree of stenosis at the origin of the ICA due to prominent irregular calcified plaque. There may be greater than 50% stenosis at this site. Distal to this, the ICA is widely patent within the neck with mild scattered calcified plaque. Vertebral arteries: Right vertebral artery slightly dominant. Mixed plaque at the origin of the right vertebral artery with suspected mild/moderate ostial stenosis. Distal to this, the right vertebral artery is widely patent within the neck with only mild scattered calcified plaque. Calcified plaque at the origin of the left vertebral artery with suspected at least moderate ostial stenosis. Distal to this, the left vertebral artery is widely patent within the neck. Skeleton: No acute bony abnormality. Cervical spondylosis with multilevel posterior disc osteophytes, uncovertebral and facet hypertrophy. No high-grade bony spinal canal stenosis. Other neck: No soft tissue neck mass or pathologically enlarged cervical chain lymph nodes. Upper chest: No consolidation within the imaged lung apices Review of the MIP images confirms the above findings CTA HEAD FINDINGS Anterior circulation: Calcified plaque within the intracranial internal carotid arteries bilaterally. Sites of up to moderate stenosis within the cavernous right ICA. No more than mild stenosis within the left ICA. The right middle and anterior cerebral arteries are patent without significant proximal stenosis. The left middle and anterior cerebral arteries are patent without significant proximal stenosis. No intracranial aneurysm is  identified. Posterior circulation: The intracranial vertebral arteries are patent without significant stenosis. Mild calcified plaque within the intracranial right vertebral artery. The basilar artery is patent without significant stenosis. Minimal calcified plaque within the proximal basilar artery. The bilateral posterior cerebral arteries are patent without significant proximal stenosis. Venous sinuses: Within limitations of contrast timing, no convincing thrombus. Anatomic variants: Posterior communicating arteries are poorly delineated bilaterally and may be hypoplastic or absent. Review of the MIP images confirms the above findings IMPRESSION: CT head: 1. 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 MRI may be obtained for confirmation, if clinically warranted. 2. Generalized parenchymal atrophy with chronic small vessel ischemic disease. CTA head: 1. No intracranial  large vessel occlusion. 2. Intracranial atherosclerotic disease as described. Most notably, there is calcified plaque within the carotid artery siphons bilaterally with sites of up to moderate stenosis within the cavernous right ICA. No more than mild narrowing within the left ICA. CTA neck: 1. Right common and internal carotid arteries patent within the neck without significant stenosis. Mixed plaque results in less than 50% narrowing of the proximal right ICA. 2. Left common and internal carotid arteries patent within the neck. It is difficult to accurately quantify the degree of stenosis at the origin of the left internal carotid artery due to the presence of prominent irregular calcified plaque. There may be greater than 50% stenosis at this site. Consider carotid artery duplex for further evaluation. 3. Bilateral vertebral arteries patent within the neck. Plaque at the vertebral artery origins with suspected mild/moderate right ostial stenosis and at least  moderate left ostial stenosis. Electronically Signed   By: Jackey Loge DO   On: 03/07/2019 22:01   US Carotid Bilateral  Result Date: 03/08/2019 CLINICAL DATA:  85 year old male with carotid plaque EXAM: BILATERAL CAROTID DUPLEX ULTRASOUND TECHNIQUE: Wallace Cullens scale imaging, color Doppler and duplex ultrasound were performed of bilateral carotid and vertebral arteries in the neck. COMPARISON:  None. FINDINGS: Criteria: Quantification of carotid stenosis is based on velocity parameters that correlate the residual internal carotid diameter with NASCET-based stenosis levels, using the diameter of the distal internal carotid lumen as the denominator for stenosis measurement. The following velocity measurements were obtained: RIGHT ICA: 64/18 cm/sec CCA: 87/15 cm/sec SYSTOLIC ICA/CCA RATIO:  0.7 ECA:  113 cm/sec LEFT ICA: 73/22 cm/sec CCA: 80/15 cm/sec SYSTOLIC ICA/CCA RATIO:  0.9 ECA:  118 cm/sec RIGHT CAROTID ARTERY: Mild heterogeneous atherosclerotic plaque in the proximal internal carotid artery. By peak systolic velocity criteria, the estimated stenosis remains less than 50%. RIGHT VERTEBRAL ARTERY:  Patent with normal antegrade flow. LEFT CAROTID ARTERY: Mild heterogeneous atherosclerotic plaque in the proximal internal carotid artery. By peak systolic velocity criteria, the estimated stenosis remains less than 50%. LEFT VERTEBRAL ARTERY:  Patent with normal antegrade flow. IMPRESSION: 1. Mild (1-49%) stenosis proximal right internal carotid artery secondary to heterogenous atherosclerotic plaque. 2. Mild (1-49%) stenosis proximal left internal carotid artery secondary to heterogenous atherosclerotic plaque. 3. Vertebral arteries are patent with normal antegrade flow. Signed, Sterling Big, MD, RPVI [facility] Electronically Signed   By: Malachy Moan M.D.   On: 03/08/2019 10:46    EKG: Normal sinus rhythm with incomplete right bundle branch  block  Weights: Filed Weights   03/07/19 1328 03/08/19 0456  Weight: 78.9 kg 80.9 kg     Physical Exam: Blood pressure 135/74, pulse (!) 56, temperature 97.9 F (36.6 C), temperature source Oral, resp. rate 16, height  (1.753 m), weight 80.9 kg, SpO2 100 %. Body mass index is 26.33 kg/m. General: Well developed, well nourished, in no acute distress. Head eyes ears nose throat: Normocephalic, atraumatic, sclera non-icteric, no xanthomas, nares are without discharge. No apparent thyromegaly and/or mass  Lungs: Normal respiratory effort.  no wheezes, no rales, no rhonchi.  Heart: RRR with normal S1 S2. no murmur gallop, no rub, PMI is normal size and placement, carotid upstroke normal without bruit, jugular venous pressure is normal Abdomen: Soft, non-tender, non-distended with normoactive bowel sounds. No hepatomegaly. No rebound/guarding. No obvious abdominal masses. Abdominal aorta is normal size without bruit Extremities: No edema. no cyanosis, no clubbing, no ulcers  Peripheral : 2+ bilateral upper extremity  pulses, 2+ bilateral femoral pulses, 2+ bilateral dorsal pedal pulse Neuro: Alert and oriented. No facial asymmetry. No focal deficit. Moves all extremities spontaneously. Musculoskeletal: Normal muscle tone without kyphosis Psych:  Responds to questions appropriately with a normal affect.    Assessment: 85 year old male with hypertension hyperlipidemia coronary disease status post previous myocardial infarction PCI and stent placement having atypical dizziness weakness of unknown etiology with mild elevation of troponin consistent with demand ischemia and echocardiogram showing normal LV function and no current evidence of congestive heart failure needing further treatment  Plan: 1.  Continue telemetry to watch for the possibility of bradycardia as a primary cause of symptoms 2.  Abstain from beta-blocker due to bradycardia 3.  Hypertension control with losartan without  change 4.  Dual antiplatelet therapy has been for for coronary artery disease 5.  High intensity cholesterol therapy 6.  Begin ambulation and follow-up for improvements of symptoms and possible further evaluation including the possibility of neurologic abnormality  Signed, Lamar Blinks M.D. [facility] 03/08/2019, 2:11 PM

## 2019-03-08 NOTE — Plan of Care (Signed)
  Problem: Education: Goal: Knowledge of General Education information will improve Description: Including pain rating scale, medication(s)/side effects and non-pharmacologic comfort measures Outcome: Progressing   Problem: Safety: Goal: Ability to remain free from injury will improve Outcome: Progressing   

## 2019-03-08 NOTE — ED Notes (Signed)
Pt easily awakened; blood drawn for repeat troponin; pt tolerated well; lights out; pt appreciative;

## 2019-03-08 NOTE — Progress Notes (Signed)
Pt was admitted on the flor with no signs of distress. Alert and oriented x 4. VSS. Have 1 piece of gold bracelet and 1 diamond earring and want to put on the safe. Calling security and states will come down here to get the item and put in the safe. Will continue to monitor.

## 2019-03-08 NOTE — Progress Notes (Signed)
PT Cancellation Note  Patient Details Name: Scott Cortez MRN: [id number] DOB: December 28, 1933   Cancelled Treatment:    Reason Eval/Treat Not Completed: Patient at procedure or test/unavailable.  Order received and chart reviewed.  Pt currently out of room.  Will return when pt is available.    Roxanne Gates, PT, DPT  Roxanne Gates 03/08/2019, 2:33 PM

## 2019-03-08 NOTE — Evaluation (Signed)
Physical Therapy Evaluation Patient Details Name: Scott Cortez MRN: [id number] DOB: 10-08-1933 Today's Date: 03/08/2019   History of Present Illness  Pt is an 85 year old male admitted with c/o weakness and report of 6 falls in the past two months. Pt monitored for bradycardia but not significant findings with imaging.  PMH includes MI, Htn, HLD, neuropathy, glaucoma and bradycardia.  Clinical Impression  Pt is an 85 year old male who lives in a single story home alone.  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  Pt able to perform bed mobility mod I and stand slowly from bedside with use of UE's.  He presented with fair to good, symmetric strength and reported neuropathy in feet along with R lateral foot pain.  Pt able to ambulate 60 ft with frequent unilateral UE support and slowed gait with higher level gait activity.  Pt able to perform static stance activity reaching outside BOS with moderate fall risk.  TUG time and functional reach indicate fall risk during static and dynamic gait activity.  Pt impulsive during evaluation, standing as PT is asking him to wait and appearing to not hear PT at times when giving directions.  Pt will continue to benefit from skilled PT with focus on strength, tolerance to activity, balance and safe functional mobility.    Follow Up Recommendations Home health PT;Supervision/Assistance - 24 hour    Equipment Recommendations  None recommended by PT    Recommendations for Other Services       Precautions / Restrictions Precautions Precautions: Fall Precaution Comments: Impulsive Restrictions Weight Bearing Restrictions: No      Mobility  Bed Mobility Overal bed mobility: Independent                Transfers Overall transfer level: Needs assistance Equipment used: Rolling walker (2 wheeled) Transfers: Sit to/from Stand Sit to Stand: Supervision         General transfer comment: Slow to stand and uses counter for  support.  Ambulation/Gait Ambulation/Gait assistance: Min guard Gait Distance (Feet): 60 Feet       Gait velocity interpretation: 1.31 - 2.62 ft/sec, indicative of limited community ambulator General Gait Details: Narrow BOS, decreased step length and foot clearance.  Pt holds to furniture when nearby and slows during turns stating "I'm not good with turns".  Stairs            Wheelchair Mobility    Modified Rankin (Stroke Patients Only)       Balance Overall balance assessment: Needs assistance   Sitting balance-Leahy Scale: Normal     Standing balance support: Single extremity supported Standing balance-Leahy Scale: Fair Standing balance comment: Intermittent use of unilateral UE for support.  No LOB's.  Able to bend to lift object from floor and reach overhead to simulate opening a cabinet door.  Functional reach: 4 inches.             High level balance activites: Direction changes;Turns;Backward walking;Side stepping High Level Balance Comments: Slows to perform higher level balance activities, especially turning. Standardized Balance Assessment Standardized Balance Assessment : TUG: Timed Up and Go Test     Timed Up and Go Test Normal TUG (seconds): 38     Pertinent Vitals/Pain Pain Assessment: No/denies pain    Home Living Family/patient expects to be discharged to:: Private residence Living Arrangements: Alone Available Help at Discharge: Family;Available PRN/intermittently Type of Home: House Home Access: Stairs to enter Entrance Stairs-Rails: Can reach both Entrance  Stairs-Number of Steps: 4 Home Layout: One level Home Equipment: Cane - single point      Prior Function Level of Independence: Independent with assistive device(s)         Comments: Limited community ambulator with [place].  Pt states he has a nephew who drives him to get groceries and he stocks up for two weeks.  Unsure if pt is a reliable historian.     Hand Dominance         Extremity/Trunk Assessment   Upper Extremity Assessment Upper Extremity Assessment: Overall WFL for tasks assessed(Grossly 4/5 bilaterally.  No sensation loss.)    Lower Extremity Assessment Lower Extremity Assessment: Overall WFL for tasks assessed(Grossly 4+/5 bilaterally.  Reports sensation loss in feet and R lateral foot pain.)    Cervical / Trunk Assessment Cervical / Trunk Assessment: Normal  Communication   Communication: HOH  Cognition Arousal/Alertness: Awake/alert Behavior During Therapy: WFL for tasks assessed/performed Overall Cognitive Status: No family/caregiver present to determine baseline cognitive functioning                                 General Comments: Pt impulsive and appears to not hear PT at times.      General Comments      Exercises     Assessment/Plan    PT Assessment Patient needs continued PT services  PT Problem List Decreased strength;Decreased mobility;Decreased activity tolerance;Decreased balance;Decreased knowledge of use of DME;Decreased safety awareness       PT Treatment Interventions DME instruction;Therapeutic activities;Gait training;Therapeutic exercise;Patient/family education;Cognitive remediation;Stair training;Balance training;Functional mobility training;Neuromuscular re-education    PT Goals (Current goals can be found in the Care Plan section)  Acute Rehab PT Goals Patient Stated Goal: To return home and figure out what is causing his falls. PT Goal Formulation: With patient Time For Goal Achievement: 03/22/19 Potential to Achieve Goals: Good    Frequency Min 2X/week   Barriers to discharge        Co-evaluation               AM-PAC PT "6 Clicks" Mobility  Outcome Measure Help needed turning from your back to your side while in a flat bed without using bedrails?: None Help needed moving from lying on your back to sitting on the side of a flat bed without using bedrails?: None Help  needed moving to and from a bed to a chair (including a wheelchair)?: A Little Help needed standing up from a chair using your arms (e.g., wheelchair or bedside chair)?: A Little Help needed to walk in hospital room?: A Little Help needed climbing 3-5 steps with a railing? : A Little 6 Click Score: 20    End of Session Equipment Utilized During Treatment: Gait belt Activity Tolerance: Patient tolerated treatment well Patient left: in chair;with call bell/phone within reach;with chair alarm set Nurse Communication: Mobility status PT Visit Diagnosis: Unsteadiness on feet (R26.81);Repeated falls (R29.6);Muscle weakness (generalized) (M62.81)    Time: 1683-7290 PT Time Calculation (min) (ACUTE ONLY): 30 min   Charges:   PT Evaluation $PT Eval Low Complexity: 1 Low          Roxanne Gates, PT, DPT   Roxanne Gates 03/08/2019, 4:51 PM

## 2019-03-08 NOTE — Progress Notes (Signed)
Patient ID: Scott Cortez, male   DOB: 1934-05-01, 85 y.o.   MRN: [id number]  [facility] PROGRESS NOTE  Scott Cortez WGN:[id number] DOB: 04/15/1934 DOA: 03/07/2019 PCP: [facility]  HPI/Subjective: Patient states that his lower body does not work very well.  He states he has had 6 falls over a period of a few months.  He lives by himself.  Yesterday he stood up and felt funny in his head.  He called EMS and brought him to the hospital.  Objective: Vitals:   03/08/19 0459 03/08/19 1018  BP: (!) 150/86 135/74  Pulse: 64 (!) 56  Resp: 17 16  Temp:  97.9 F (36.6 C)  SpO2: 97% 100%    Intake/Output Summary (Last 24 hours) at 03/08/2019 1347 Last data filed at 03/08/2019 1300 Gross per 24 hour  Intake 1860 ml  Output 1500 ml  Net 360 ml   Filed Weights   03/07/19 1328 03/08/19 0456  Weight: 78.9 kg 80.9 kg    ROS: Review of Systems  Constitutional: Negative for chills and fever.  Eyes: Negative for blurred vision.  Respiratory: Negative for cough and shortness of breath.   Cardiovascular: Negative for chest pain.  Gastrointestinal: Negative for abdominal pain, constipation, diarrhea, nausea and vomiting.  Genitourinary: Negative for dysuria.  Musculoskeletal: Negative for joint pain.  Neurological: Positive for dizziness. Negative for headaches.   Exam: Physical Exam  Constitutional: He is oriented to person, place, and time.  HENT:  Nose: No mucosal edema.  Mouth/Throat: No oropharyngeal exudate or posterior oropharyngeal edema.  Eyes: Pupils are equal, round, and reactive to light. Conjunctivae, EOM and lids are normal.  Neck: No JVD present. Carotid bruit is not present. No edema present. No thyroid mass and no thyromegaly present.  Cardiovascular: S1 normal and S2 normal. Bradycardia present. Exam reveals no gallop.  No murmur heard. Pulses:      Dorsalis pedis pulses are 2+ on the right side and 2+ on the left side.  Respiratory: No  respiratory distress. He has no wheezes. He has no rhonchi. He has no rales.  GI: Soft. Bowel sounds are normal. There is no abdominal tenderness.  Musculoskeletal:     Right ankle: He exhibits no swelling.     Left ankle: He exhibits no swelling.  Lymphadenopathy:    He has no cervical adenopathy.  Neurological: He is alert and oriented to person, place, and time. No cranial nerve deficit.  Left hand finger-to-nose nose slightly impaired.  Unsteady with Romberg testing  Skin: Skin is warm. No rash noted. Nails show no clubbing.  Psychiatric: He has a normal mood and affect.      Data Reviewed: Basic Metabolic Panel: Recent Labs  Lab 03/07/19 1331 03/08/19 0531  NA 138 139  K 4.0 3.8  CL 102 104  CO2 26 26  GLUCOSE 149* 110*  BUN 13 11  CREATININE 1.09 0.65  CALCIUM 9.3 9.1   Liver Function Tests: Recent Labs  Lab 03/07/19 1331  AST 25  ALT 13  ALKPHOS 82  BILITOT 1.7*  PROT 6.7  ALBUMIN 3.9   No results for input(s): LIPASE, AMYLASE in the last 168 hours. No results for input(s): AMMONIA in the last 168 hours. CBC: Recent Labs  Lab 03/07/19 1331 03/08/19 0531  WBC 4.7 4.7  NEUTROABS 3.2  --   HGB 13.7 13.1  HCT 40.5 39.0  MCV 91.0 91.1  PLT 174 180     Recent Results (from the past  240 hour(s))  SARS CORONAVIRUS 2 (TAT 6-24 HRS) Nasopharyngeal Nasopharyngeal Swab     Status: None   Collection Time: 03/07/19  6:59 PM   Specimen: Nasopharyngeal Swab  Result Value Ref Range Status   SARS Coronavirus 2 NEGATIVE NEGATIVE Final    Comment: (NOTE) SARS-CoV-2 target nucleic acids are NOT DETECTED. The SARS-CoV-2 RNA is generally detectable in upper and lower respiratory specimens during the acute phase of infection. Negative results do not preclude SARS-CoV-2 infection, do not rule out co-infections with other pathogens, and should not be used as the sole basis for treatment or other patient management decisions. Negative results must be combined with  clinical observations, patient history, and epidemiological information. The expected result is Negative. Fact Sheet for Patients: HairSlick.no Fact Sheet for Healthcare Providers: quierodirigir.com This test is not yet approved or cleared by the [place] FDA and  has been authorized for detection and/or diagnosis of SARS-CoV-2 by FDA under an Emergency Use Authorization (EUA). This EUA will remain  in effect (meaning this test can be used) for the duration of the COVID-19 declaration under Section 56 4(b)(1) of the Act, 21 U.S.C. section 360bbb-3(b)(1), unless the authorization is terminated or revoked sooner. Performed at [facility], [address], [place], [place] [address]      Studies: Ct Angio Head W Or Wo Contrast  Result Date: 03/07/2019 CLINICAL DATA:  Syncope/fainting, cardiac etiology suspected. Sudden onset of weakness today. EXAM: CT ANGIOGRAPHY HEAD AND NECK TECHNIQUE: Multidetector CT imaging of the head and neck was performed using the standard protocol during bolus administration of intravenous contrast. Multiplanar CT image reconstructions and MIPs were obtained to evaluate the vascular anatomy. Carotid stenosis measurements (when applicable) are obtained utilizing NASCET criteria, using the distal internal carotid diameter as the denominator. CONTRAST:  19mL OMNIPAQUE IOHEXOL 350 MG/ML SOLN COMPARISON:  Head CT 01/09/2016 FINDINGS: CT HEAD FINDINGS Brain: New from prior head CT 01/09/2016, there is a chronic appearing cortical/subcortical infarct within the anterolateral left frontal lobe. No other demarcated cortical infarction is identified. No evidence of intracranial mass. No midline shift or extra-axial fluid collection. Ill-defined hypoattenuation of the cerebral white matter consistent with chronic small vessel ischemic disease. Moderate generalized parenchymal atrophy. Vascular: Reported separately  Skull: Normal. Negative for fracture or focal lesion. Sinuses: Mild ethmoid sinus mucosal thickening. No significant mastoid effusion. Orbits: Visualized orbits demonstrate no acute abnormality. Review of the MIP images confirms the above findings CTA NECK FINDINGS Aortic arch: The left vertebral artery arises directly from the aortic arch. Scattered soft and calcified plaque within the visualized aortic arch and proximal major branch vessels of the neck. The visualized aortic arch is ectatic measuring up to 3.5 cm in diameter. Right carotid system: Common carotid artery widely patent. Soft and calcified plaque at the carotid bifurcation and within the proximal right ICA with less than 50% narrowing of the proximal ICA. Distal to this, the ICA is widely patent within the neck without stenosis. Left carotid system: Common carotid artery widely patent with scattered calcified plaque. Predominantly calcified plaque at the carotid bifurcation and within the proximal left ICA. It is difficult to accurately quantify the degree of stenosis at the origin of the ICA due to prominent irregular calcified plaque. There may be greater than 50% stenosis at this site. Distal to this, the ICA is widely patent within the neck with mild scattered calcified plaque. Vertebral arteries: Right vertebral artery slightly dominant. Mixed plaque at the origin of the right vertebral artery  with suspected mild/moderate ostial stenosis. Distal to this, the right vertebral artery is widely patent within the neck with only mild scattered calcified plaque. Calcified plaque at the origin of the left vertebral artery with suspected at least moderate ostial stenosis. Distal to this, the left vertebral artery is widely patent within the neck. Skeleton: No acute bony abnormality. Cervical spondylosis with multilevel posterior disc osteophytes, uncovertebral and facet hypertrophy. No high-grade bony spinal canal stenosis. Other neck: No soft tissue neck  mass or pathologically enlarged cervical chain lymph nodes. Upper chest: No consolidation within the imaged lung apices Review of the MIP images confirms the above findings CTA HEAD FINDINGS Anterior circulation: Calcified plaque within the intracranial internal carotid arteries bilaterally. Sites of up to moderate stenosis within the cavernous right ICA. No more than mild stenosis within the left ICA. The right middle and anterior cerebral arteries are patent without significant proximal stenosis. The left middle and anterior cerebral arteries are patent without significant proximal stenosis. No intracranial aneurysm is identified. Posterior circulation: The intracranial vertebral arteries are patent without significant stenosis. Mild calcified plaque within the intracranial right vertebral artery. The basilar artery is patent without significant stenosis. Minimal calcified plaque within the proximal basilar artery. The bilateral posterior cerebral arteries are patent without significant proximal stenosis. Venous sinuses: Within limitations of contrast timing, no convincing thrombus. Anatomic variants: Posterior communicating arteries are poorly delineated bilaterally and may be hypoplastic or absent. Review of the MIP images confirms the above findings IMPRESSION: CT head: 1. 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 MRI may be obtained for confirmation, if clinically warranted. 2. Generalized parenchymal atrophy with chronic small vessel ischemic disease. CTA head: 1. No intracranial large vessel occlusion. 2. Intracranial atherosclerotic disease as described. Most notably, there is calcified plaque within the carotid artery siphons bilaterally with sites of up to moderate stenosis within the cavernous right ICA. No more than mild narrowing within the left ICA. CTA neck: 1. Right common and internal carotid arteries patent within  the neck without significant stenosis. Mixed plaque results in less than 50% narrowing of the proximal right ICA. 2. Left common and internal carotid arteries patent within the neck. It is difficult to accurately quantify the degree of stenosis at the origin of the left internal carotid artery due to the presence of prominent irregular calcified plaque. There may be greater than 50% stenosis at this site. Consider carotid artery duplex for further evaluation. 3. Bilateral vertebral arteries patent within the neck. Plaque at the vertebral artery origins with suspected mild/moderate right ostial stenosis and at least moderate left ostial stenosis. Electronically Signed   By: Jackey Loge DO   On: 03/07/2019 22:01   Ct Angio Neck W Or Wo Contrast  Result Date: 03/07/2019 CLINICAL DATA:  Syncope/fainting, cardiac etiology suspected. Sudden onset of weakness today. EXAM: CT ANGIOGRAPHY HEAD AND NECK TECHNIQUE: Multidetector CT imaging of the head and neck was performed using the standard protocol during bolus administration of intravenous contrast. Multiplanar CT image reconstructions and MIPs were obtained to evaluate the vascular anatomy. Carotid stenosis measurements (when applicable) are obtained utilizing NASCET criteria, using the distal internal carotid diameter as the denominator. CONTRAST:  75mL OMNIPAQUE IOHEXOL 350 MG/ML SOLN COMPARISON:  Head CT 01/09/2016 FINDINGS: CT HEAD FINDINGS Brain: New from prior head CT 01/09/2016, there is a chronic appearing cortical/subcortical infarct within the anterolateral left frontal lobe. No other demarcated cortical infarction is identified. No evidence of intracranial  mass. No midline shift or extra-axial fluid collection. Ill-defined hypoattenuation of the cerebral white matter consistent with chronic small vessel ischemic disease. Moderate generalized parenchymal atrophy. Vascular: Reported separately Skull: Normal. Negative for fracture or focal lesion. Sinuses:  Mild ethmoid sinus mucosal thickening. No significant mastoid effusion. Orbits: Visualized orbits demonstrate no acute abnormality. Review of the MIP images confirms the above findings CTA NECK FINDINGS Aortic arch: The left vertebral artery arises directly from the aortic arch. Scattered soft and calcified plaque within the visualized aortic arch and proximal major branch vessels of the neck. The visualized aortic arch is ectatic measuring up to 3.5 cm in diameter. Right carotid system: Common carotid artery widely patent. Soft and calcified plaque at the carotid bifurcation and within the proximal right ICA with less than 50% narrowing of the proximal ICA. Distal to this, the ICA is widely patent within the neck without stenosis. Left carotid system: Common carotid artery widely patent with scattered calcified plaque. Predominantly calcified plaque at the carotid bifurcation and within the proximal left ICA. It is difficult to accurately quantify the degree of stenosis at the origin of the ICA due to prominent irregular calcified plaque. There may be greater than 50% stenosis at this site. Distal to this, the ICA is widely patent within the neck with mild scattered calcified plaque. Vertebral arteries: Right vertebral artery slightly dominant. Mixed plaque at the origin of the right vertebral artery with suspected mild/moderate ostial stenosis. Distal to this, the right vertebral artery is widely patent within the neck with only mild scattered calcified plaque. Calcified plaque at the origin of the left vertebral artery with suspected at least moderate ostial stenosis. Distal to this, the left vertebral artery is widely patent within the neck. Skeleton: No acute bony abnormality. Cervical spondylosis with multilevel posterior disc osteophytes, uncovertebral and facet hypertrophy. No high-grade bony spinal canal stenosis. Other neck: No soft tissue neck mass or pathologically enlarged cervical chain lymph nodes.  Upper chest: No consolidation within the imaged lung apices Review of the MIP images confirms the above findings CTA HEAD FINDINGS Anterior circulation: Calcified plaque within the intracranial internal carotid arteries bilaterally. Sites of up to moderate stenosis within the cavernous right ICA. No more than mild stenosis within the left ICA. The right middle and anterior cerebral arteries are patent without significant proximal stenosis. The left middle and anterior cerebral arteries are patent without significant proximal stenosis. No intracranial aneurysm is identified. Posterior circulation: The intracranial vertebral arteries are patent without significant stenosis. Mild calcified plaque within the intracranial right vertebral artery. The basilar artery is patent without significant stenosis. Minimal calcified plaque within the proximal basilar artery. The bilateral posterior cerebral arteries are patent without significant proximal stenosis. Venous sinuses: Within limitations of contrast timing, no convincing thrombus. Anatomic variants: Posterior communicating arteries are poorly delineated bilaterally and may be hypoplastic or absent. Review of the MIP images confirms the above findings IMPRESSION: CT head: 1. 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 MRI may be obtained for confirmation, if clinically warranted. 2. Generalized parenchymal atrophy with chronic small vessel ischemic disease. CTA head: 1. No intracranial large vessel occlusion. 2. Intracranial atherosclerotic disease as described. Most notably, there is calcified plaque within the carotid artery siphons bilaterally with sites of up to moderate stenosis within the cavernous right ICA. No more than mild narrowing within the left ICA. CTA neck: 1. Right common and internal carotid arteries patent within the neck without significant  stenosis. Mixed plaque results  in less than 50% narrowing of the proximal right ICA. 2. Left common and internal carotid arteries patent within the neck. It is difficult to accurately quantify the degree of stenosis at the origin of the left internal carotid artery due to the presence of prominent irregular calcified plaque. There may be greater than 50% stenosis at this site. Consider carotid artery duplex for further evaluation. 3. Bilateral vertebral arteries patent within the neck. Plaque at the vertebral artery origins with suspected mild/moderate right ostial stenosis and at least moderate left ostial stenosis. Electronically Signed   By: Jackey Loge DO   On: 03/07/2019 22:01   US Carotid Bilateral  Result Date: 03/08/2019 CLINICAL DATA:  85 year old male with carotid plaque EXAM: BILATERAL CAROTID DUPLEX ULTRASOUND TECHNIQUE: Wallace Cullens scale imaging, color Doppler and duplex ultrasound were performed of bilateral carotid and vertebral arteries in the neck. COMPARISON:  None. FINDINGS: Criteria: Quantification of carotid stenosis is based on velocity parameters that correlate the residual internal carotid diameter with NASCET-based stenosis levels, using the diameter of the distal internal carotid lumen as the denominator for stenosis measurement. The following velocity measurements were obtained: RIGHT ICA: 64/18 cm/sec CCA: 87/15 cm/sec SYSTOLIC ICA/CCA RATIO:  0.7 ECA:  113 cm/sec LEFT ICA: 73/22 cm/sec CCA: 80/15 cm/sec SYSTOLIC ICA/CCA RATIO:  0.9 ECA:  118 cm/sec RIGHT CAROTID ARTERY: Mild heterogeneous atherosclerotic plaque in the proximal internal carotid artery. By peak systolic velocity criteria, the estimated stenosis remains less than 50%. RIGHT VERTEBRAL ARTERY:  Patent with normal antegrade flow. LEFT CAROTID ARTERY: Mild heterogeneous atherosclerotic plaque in the proximal internal carotid artery. By peak systolic velocity criteria, the estimated stenosis remains less than 50%. LEFT VERTEBRAL ARTERY:  Patent with normal  antegrade flow. IMPRESSION: 1. Mild (1-49%) stenosis proximal right internal carotid artery secondary to heterogenous atherosclerotic plaque. 2. Mild (1-49%) stenosis proximal left internal carotid artery secondary to heterogenous atherosclerotic plaque. 3. Vertebral arteries are patent with normal antegrade flow. Signed, Sterling Big, MD, RPVI [facility] Electronically Signed   By: Malachy Moan M.D.   On: 03/08/2019 10:46    Scheduled Meds: . amLODipine  5 mg Oral Daily  . aspirin EC  325 mg Oral Daily  . atorvastatin  20 mg Oral Daily  . clopidogrel  75 mg Oral Daily  . enoxaparin (LOVENOX) injection  40 mg Subcutaneous Q24H  . famotidine  20 mg Oral QHS  . [START ON 03/09/2019] influenza vaccine adjuvanted  0.5 mL Intramuscular Tomorrow-1000  . losartan  50 mg Oral Daily  . pantoprazole  40 mg Oral Daily  . sodium chloride flush  3 mL Intravenous Q12H  . tamsulosin  0.4 mg Oral Daily   Continuous Infusions: . sodium chloride 75 mL/hr at 03/08/19 0447    Assessment/Plan:  1. Near syncope, unsteady with Romberg, impaired left finger nose movements.  Will get MRI of the brain to see if he has had a cerebellar stroke.  Patient already on aspirin Plavix and atorvastatin.  We will get physical therapy consultation with his unsteady gait. 2. Hypertension continue usual medications 3. Hyperlipidemia on atorvastatin 4. BPH on Flomax. 5. History of diastolic congestive heart failure no signs of heart failure currently.  Careful with fluids. 6. Bradycardia.  Not on any rate controlling medications.  Continue to monitor on telemetry.  Code Status:     Code Status Orders  (From admission, onward)         Start  Ordered   03/07/19 2030  Full code  Continuous     03/07/19 2033        Code Status History    This patient has a current code status but no historical code status.   Advance Care Planning Activity      Family Communication: The number for his sister is disconnected.  The number for his niece was busy. Disposition Plan: Hopefully home tomorrow  Consultants:  Cardiology  Time spent: 35 minutes  Francina Beery [employer]  [employer]

## 2019-03-08 NOTE — ED Notes (Signed)
In to check on pt; he says he's not had a very good experience; says he's been here for a very long time and not had anything to eat; plus he was told around 830pm there was an admission bed for him; explained to pt that unfortunately he doesn't have an admission bed, but I will find him a comfortable hospital bed to put in this room for the night; after looking at admitting orders, I told pt I'm happy to find him something to eat; pt appreciative; says he's not complaining, would just like to be kept informed;

## 2019-03-09 ENCOUNTER — Inpatient Hospital Stay: Payer: Medicare Other

## 2019-03-09 LAB — GLUCOSE, CAPILLARY: Glucose-Capillary: 84 mg/dL (ref 70–99)

## 2019-03-09 MED ORDER — GADOBUTROL 1 MMOL/ML IV SOLN
7.0000 mL | Freq: Once | INTRAVENOUS | Status: AC | PRN
  Administered 2019-03-09: 7 mL via INTRAVENOUS

## 2019-03-09 MED ORDER — PREDNISOLONE-GATIFLOXACIN 1-0.5 % OP SUSP
1.0000 [drp] | Freq: Two times a day (BID) | OPHTHALMIC | Status: DC
  Administered 2019-03-09 – 2019-03-12 (×6): 1 [drp] via OPHTHALMIC
  Filled 2019-03-09: qty 3.5

## 2019-03-09 NOTE — Progress Notes (Signed)
Patient currently getting eye drops for recent cataract surgery. Patient has own eye drops. MD notified to put orders in. MD advised to get help from pharmacy to put order in by verbal order. Pharmacy assisted with this.

## 2019-03-09 NOTE — TOC Initial Note (Signed)
Transition of Care [facility]) - Initial/Assessment Note    Patient Details  Name: Scott Cortez MRN: [id number] Date of Birth: 08-Oct-1933  [facility]) CM/SW Contact:    Candie Chroman, LCSW Phone Number: 03/09/2019, 9:52 AM  Clinical Narrative: CSW met with patient, introduced role, and explained that PT recommendations would be discussed. Patient agreeable to HHPT. Reviewed list of CMS scores for agencies that serve his zip code. First preference is [place]. Referral made to representative. Patient also requesting a walker once he goes home. He only uses a walking stick at home and says after a couple of hours it is more difficult for him to ambulate. No further concerns. CSW encouraged patient to contact CSW as needed. CSW will continue to follow patient for support and facilitate return home when stable.                 Expected Discharge Plan: [place]     Patient Goals and CMS Choice   CMS Medicare.gov Compare Post Acute Care list provided to:: Patient Choice offered to / list presented to : Patient  Expected Discharge Plan and Services Expected Discharge Plan: [place] Acute Care Choice: Home Health, Durable Medical Equipment Living arrangements for the past 2 months: [place]: PT [place]: [place] ([place]) Date [place]: 03/09/19   Representative spoke with at [place]: Floydene Flock  Prior Living Arrangements/Services Living arrangements for the past 2 months: [place] Lives with:: Self Patient language and need for interpreter reviewed:: Yes Do you feel safe going back to the place where you live?: Yes      Need for Family Participation in Patient Care: Yes (Comment) Care giver support system in place?: Yes (comment)   Criminal Activity/Legal Involvement Pertinent to Current Situation/Hospitalization: No -  Comment as needed  Activities of Daily Living Home Assistive Devices/Equipment: Cane (specify quad or straight)(1 prongs) ADL Screening (condition at time of admission) Patient's cognitive ability adequate to safely complete daily activities?: Yes Is the patient deaf or have difficulty hearing?: Yes Does the patient have difficulty seeing, even when wearing glasses/contacts?: No Does the patient have difficulty concentrating, remembering, or making decisions?: No Patient able to express need for assistance with ADLs?: Yes Does the patient have difficulty dressing or bathing?: No Independently performs ADLs?: Yes (appropriate for developmental age) Does the patient have difficulty walking or climbing stairs?: Yes Weakness of Legs: Both Weakness of Arms/Hands: None  Permission Sought/Granted Permission sought to share information with : Facility Art therapist granted to share information with : Yes, Verbal Permission Granted     Permission granted to share info w AGENCY: Home Health Agencies        Emotional Assessment Appearance:: Appears stated age Attitude/Demeanor/Rapport: Engaged, Gracious Affect (typically observed): Accepting, Appropriate, Calm, Pleasant Orientation: : Oriented to Self, Oriented to Place, Oriented to  Time, Oriented to Situation Alcohol / Substance Use: Never Used Psych Involvement: No (comment)  Admission diagnosis:  Weakness [R53.1] Near syncope [R55] Patient Active Problem List   Diagnosis Date Noted  . Near syncope 03/07/2019  . Nocturia 06/26/2016  . Lower abdominal pain 09/04/2015  . Erectile dysfunction 08/30/2014  . 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  Incomplete emptying of bladder 01/26/2014  . Increased frequency of urination 01/26/2014  . Slowing of urinary stream 01/26/2014   PCP:  [facility], [place]:   [place], [place] [address] Phone: [phone] Fax: [phone]     Social Determinants of Health (SDOH) Interventions    Readmission Risk Interventions No flowsheet data found.

## 2019-03-09 NOTE — Plan of Care (Signed)
  Problem: Education: Goal: Knowledge of General Education information will improve Description: Including pain rating scale, medication(s)/side effects and non-pharmacologic comfort measures Outcome: Progressing   Problem: Clinical Measurements: Goal: Respiratory complications will improve Outcome: Progressing   Problem: Activity: Goal: Risk for activity intolerance will decrease Outcome: Progressing   

## 2019-03-09 NOTE — Progress Notes (Signed)
Physical Therapy Treatment Patient Details Name: Scott Cortez MRN: [id number] DOB: November 09, 1933 Today's Date: 03/09/2019    History of Present Illness Pt is an 85 year old male admitted with c/o weakness and report of 6 falls in the past two months. Pt monitored for bradycardia but not significant findings with imaging.  PMH includes MI, Htn, HLD, neuropathy, glaucoma and bradycardia.    PT Comments    Pt presented with deficits in strength, transfers, gait, and balance, and activity tolerance. Pt denies any pain, though he reported that his back was extremely painful during the MRI earlier in the day. Pt completed bed exercises and transfers with extensive cuing 2/2 being HOH. Pt ambulated to the stairs using the RW, stumbled briefly when reaching out for the rail, but self-corrected with the walker and close CGA. Pt's pattern for ascent caused him to lean too far anterior and lateral outside of his BOS, and for descent the pattern was very disorganized and unsafe 2/2 pt failing to progress hands and holding on to the rails a stair or two behind his body, which now pulled him off balance posteriorly. Pt made a second attempt after seeing a demonstration of a safer pattern, and was able to make minor corrections. Pt ambulated back to room, completed the last 50 with CGA and no AD, and started taking smaller, more tentative steps. Pt's vital checked after stairs: SpO2 95%, HR 75, and pt did not fatigue by end of session. Pt will benefit from HHPT upon discharge to safely address above deficits for decreased caregiver assistance and eventual return to PLOF.    Follow Up Recommendations  Home health PT;Supervision/Assistance - 24 hour     Equipment Recommendations  Rolling walker with 5" wheels    Recommendations for Other Services       Precautions / Restrictions Precautions Precautions: Fall Restrictions Weight Bearing Restrictions: No    Mobility  Bed Mobility Overal bed mobility:  Independent                Transfers Overall transfer level: Needs assistance Equipment used: Rolling walker (2 wheeled) Transfers: Sit to/from Stand Sit to Stand: Supervision         General transfer comment: Min to mod verbal cuing, otherwise does not require physical assist for transfers.  Ambulation/Gait Ambulation/Gait assistance: Supervision Gait Distance (Feet): 100 Feet x2 Assistive device: Rolling walker (2 wheeled) Gait Pattern/deviations: Step-through pattern;Decreased step length - right;Decreased step length - left Gait velocity: Decreased   General Gait Details: Narrow BOS, decreased step length and foot clearance, pt reports "I'm being careful" with his steps.   Stairs Stairs: Yes Stairs assistance: Min guard Stair Management: Two rails Number of Stairs: 6 General stair comments: Pt unsteady with lateral balance/weightshifting, x1 LOB on initial approach to stairs, required extensive cuing and demonstration to progress his hands in front of his for ascent & descent, fair concentric and eccentric control.   Wheelchair Mobility    Modified Rankin (Stroke Patients Only)       Balance Overall balance assessment: Needs assistance Sitting-balance support: Feet supported;No upper extremity supported Sitting balance-Leahy Scale: Good Sitting balance - Comments: Steady static sitting, reaching within BOS. Occasionally use of 1 UE to support self on bed during weight shift.   Standing balance support: Bilateral upper extremity supported;During functional activity Standing balance-Leahy Scale: Fair Standing balance comment: Pt uses BUE to stabalize self on RW during funcitonal mobility. No LOB noted.  Cognition Arousal/Alertness: Awake/alert Behavior During Therapy: WFL for tasks assessed/performed Overall Cognitive Status: No family/caregiver present to determine baseline cognitive functioning                                  General Comments: Pt eager, almost impulsive. Requires multimodal cuing for following multi-step commands and sequencing motor tasks. Pleasant and conversational (at times tangential) through treatment.      Exercises Total Joint Exercises Ankle Circles/Pumps: AROM;Strengthening;Both;10 reps Long Arc Quad: AROM;Strengthening;Both;10 reps Marching in Standing: AROM;Strengthening;10 reps Other Exercises Other Exercises: Pt educated on safe pattern for ascent & descent of stairs, using BUE for support on rails and use a step-to pattern for BLE.    General Comments        Pertinent Vitals/Pain Pain Assessment: No/denies pain    Home Living                      Prior Function            PT Goals (current goals can now be found in the care plan section) Progress towards PT goals: Progressing toward goals    Frequency    Min 2X/week      PT Plan Current plan remains appropriate    Co-evaluation              AM-PAC PT "6 Clicks" Mobility   Outcome Measure  Help needed turning from your back to your side while in a flat bed without using bedrails?: None Help needed moving from lying on your back to sitting on the side of a flat bed without using bedrails?: None Help needed moving to and from a bed to a chair (including a wheelchair)?: None Help needed standing up from a chair using your arms (e.g., wheelchair or bedside chair)?: A Little Help needed to walk in hospital room?: A Little Help needed climbing 3-5 steps with a railing? : A Little 6 Click Score: 21    End of Session Equipment Utilized During Treatment: Gait belt Activity Tolerance: Patient tolerated treatment well Patient left: in chair;with call bell/phone within reach;with chair alarm set Nurse Communication: Mobility status PT Visit Diagnosis: Unsteadiness on feet (R26.81);Repeated falls (R29.6);Muscle weakness (generalized) (M62.81)     Time: 8270-7867 PT Time  Calculation (min) (ACUTE ONLY): 38 min  Charges:              [name], SPT  03/09/19, 5:39 PM

## 2019-03-09 NOTE — Progress Notes (Signed)
[facility] Cardiology [facility] Encounter Note  Patient: Scott Cortez / Admit Date: 03/07/2019 / Date of Encounter: 03/09/2019, 8:44 AM   Subjective: Patient is ambulating well without evidence of significant symptoms or issues of congestive heart failure or angina.  Patient has not had any dizziness or EKG changes or telemetry issues.  Troponin is flat at peak of 54.  Hypertension reasonably controlled.  Echocardiogram showing normal LV systolic function ejection fraction of 55 to 60% with no evidence of significant valvular heart disease.  Review of Systems: Positive for: None Negative for: Vision change, hearing change, syncope, dizziness, nausea, vomiting,diarrhea, bloody stool, stomach pain, cough, congestion, diaphoresis, urinary frequency, urinary pain,skin lesions, skin rashes Others previously listed  Objective: Telemetry: Normal sinus rhythm Physical Exam: Blood pressure 127/75, pulse (!) 56, temperature 97.7 F (36.5 C), temperature source Oral, resp. rate 19, height 5\' 9"  (1.753 m), weight 79.8 kg, SpO2 99 %. Body mass index is 25.98 kg/m. General: Well developed, well nourished, in no acute distress. Head: Normocephalic, atraumatic, sclera non-icteric, no xanthomas, nares are without discharge. Neck: No apparent masses Lungs: Normal respirations with no wheezes, no rhonchi, no rales , no crackles   Heart: Regular rate and rhythm, normal S1 S2, no murmur, no rub, no gallop, PMI is normal size and placement, carotid upstroke normal without bruit, jugular venous pressure normal Abdomen: Soft, non-tender, non-distended with normoactive bowel sounds. No hepatosplenomegaly. Abdominal aorta is normal size without bruit Extremities: No edema, no clubbing, no cyanosis, no ulcers,  Peripheral: 2+ radial, 2+ femoral, 2+ dorsal pedal pulses Neuro: Alert and oriented. Moves all extremities spontaneously. Psych:  Responds to questions appropriately with a normal  affect.   Intake/Output Summary (Last 24 hours) at 03/09/2019 0844 Last data filed at 03/09/2019 0717 Gross per 24 hour  Intake 811.26 ml  Output 1650 ml  Net -838.74 ml    Inpatient Medications:  . amLODipine  5 mg Oral Daily  . aspirin EC  325 mg Oral Daily  . atorvastatin  20 mg Oral Daily  . clopidogrel  75 mg Oral Daily  . enoxaparin (LOVENOX) injection  40 mg Subcutaneous Q24H  . famotidine  20 mg Oral QHS  . influenza vaccine adjuvanted  0.5 mL Intramuscular Tomorrow-1000  . losartan  50 mg Oral Daily  . pantoprazole  40 mg Oral Daily  . sodium chloride flush  3 mL Intravenous Q12H  . tamsulosin  0.4 mg Oral Daily   Infusions:  . sodium chloride 30 mL/hr at 03/08/19 1844    Labs: Recent Labs    03/07/19 1331 03/08/19 0531  NA 138 139  K 4.0 3.8  CL 102 104  CO2 26 26  GLUCOSE 149* 110*  BUN 13 11  CREATININE 1.09 0.65  CALCIUM 9.3 9.1   Recent Labs    03/07/19 1331  AST 25  ALT 13  ALKPHOS 82  BILITOT 1.7*  PROT 6.7  ALBUMIN 3.9   Recent Labs    03/07/19 1331 03/08/19 0531  WBC 4.7 4.7  NEUTROABS 3.2  --   HGB 13.7 13.1  HCT 40.5 39.0  MCV 91.0 91.1  PLT 174 180   No results for input(s): CKTOTAL, CKMB, TROPONINI in the last 72 hours. Invalid input(s): POCBNP No results for input(s): HGBA1C in the last 72 hours.   Weights: Filed Weights   03/07/19 1328 03/08/19 0456 03/09/19 0150  Weight: 78.9 kg 80.9 kg 79.8 kg     Radiology/Studies:  Ct Angio Head W Or Wo  Contrast  Result Date: 03/07/2019 CLINICAL DATA:  Syncope/fainting, cardiac etiology suspected. Sudden onset of weakness today. EXAM: CT ANGIOGRAPHY HEAD AND NECK TECHNIQUE: Multidetector CT imaging of the head and neck was performed using the standard protocol during bolus administration of intravenous contrast. Multiplanar CT image reconstructions and MIPs were obtained to evaluate the vascular anatomy. Carotid stenosis measurements (when applicable) are obtained utilizing  NASCET criteria, using the distal internal carotid diameter as the denominator. CONTRAST:  75mL OMNIPAQUE IOHEXOL 350 MG/ML SOLN COMPARISON:  Head CT 01/09/2016 FINDINGS: CT HEAD FINDINGS Brain: New from prior head CT 01/09/2016, there is a chronic appearing cortical/subcortical infarct within the anterolateral left frontal lobe. No other demarcated cortical infarction is identified. No evidence of intracranial mass. No midline shift or extra-axial fluid collection. Ill-defined hypoattenuation of the cerebral white matter consistent with chronic small vessel ischemic disease. Moderate generalized parenchymal atrophy. Vascular: Reported separately Skull: Normal. Negative for fracture or focal lesion. Sinuses: Mild ethmoid sinus mucosal thickening. No significant mastoid effusion. Orbits: Visualized orbits demonstrate no acute abnormality. Review of the MIP images confirms the above findings CTA NECK FINDINGS Aortic arch: The left vertebral artery arises directly from the aortic arch. Scattered soft and calcified plaque within the visualized aortic arch and proximal major branch vessels of the neck. The visualized aortic arch is ectatic measuring up to 3.5 cm in diameter. Right carotid system: Common carotid artery widely patent. Soft and calcified plaque at the carotid bifurcation and within the proximal right ICA with less than 50% narrowing of the proximal ICA. Distal to this, the ICA is widely patent within the neck without stenosis. Left carotid system: Common carotid artery widely patent with scattered calcified plaque. Predominantly calcified plaque at the carotid bifurcation and within the proximal left ICA. It is difficult to accurately quantify the degree of stenosis at the origin of the ICA due to prominent irregular calcified plaque. There may be greater than 50% stenosis at this site. Distal to this, the ICA is widely patent within the neck with mild scattered calcified plaque. Vertebral arteries: Right  vertebral artery slightly dominant. Mixed plaque at the origin of the right vertebral artery with suspected mild/moderate ostial stenosis. Distal to this, the right vertebral artery is widely patent within the neck with only mild scattered calcified plaque. Calcified plaque at the origin of the left vertebral artery with suspected at least moderate ostial stenosis. Distal to this, the left vertebral artery is widely patent within the neck. Skeleton: No acute bony abnormality. Cervical spondylosis with multilevel posterior disc osteophytes, uncovertebral and facet hypertrophy. No high-grade bony spinal canal stenosis. Other neck: No soft tissue neck mass or pathologically enlarged cervical chain lymph nodes. Upper chest: No consolidation within the imaged lung apices Review of the MIP images confirms the above findings CTA HEAD FINDINGS Anterior circulation: Calcified plaque within the intracranial internal carotid arteries bilaterally. Sites of up to moderate stenosis within the cavernous right ICA. No more than mild stenosis within the left ICA. The right middle and anterior cerebral arteries are patent without significant proximal stenosis. The left middle and anterior cerebral arteries are patent without significant proximal stenosis. No intracranial aneurysm is identified. Posterior circulation: The intracranial vertebral arteries are patent without significant stenosis. Mild calcified plaque within the intracranial right vertebral artery. The basilar artery is patent without significant stenosis. Minimal calcified plaque within the proximal basilar artery. The bilateral posterior cerebral arteries are patent without significant proximal stenosis. Venous sinuses: Within limitations of contrast timing, no convincing thrombus. Anatomic  variants: Posterior communicating arteries are poorly delineated bilaterally and may be hypoplastic or absent. Review of the MIP images confirms the above findings IMPRESSION: CT  head: 1. 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 MRI may be obtained for confirmation, if clinically warranted. 2. Generalized parenchymal atrophy with chronic small vessel ischemic disease. CTA head: 1. No intracranial large vessel occlusion. 2. Intracranial atherosclerotic disease as described. Most notably, there is calcified plaque within the carotid artery siphons bilaterally with sites of up to moderate stenosis within the cavernous right ICA. No more than mild narrowing within the left ICA. CTA neck: 1. Right common and internal carotid arteries patent within the neck without significant stenosis. Mixed plaque results in less than 50% narrowing of the proximal right ICA. 2. Left common and internal carotid arteries patent within the neck. It is difficult to accurately quantify the degree of stenosis at the origin of the left internal carotid artery due to the presence of prominent irregular calcified plaque. There may be greater than 50% stenosis at this site. Consider carotid artery duplex for further evaluation. 3. Bilateral vertebral arteries patent within the neck. Plaque at the vertebral artery origins with suspected mild/moderate right ostial stenosis and at least moderate left ostial stenosis. Electronically Signed   By: Kellie Simmering DO   On: 03/07/2019 22:01   Ct Angio Neck W Or Wo Contrast  Result Date: 03/07/2019 CLINICAL DATA:  Syncope/fainting, cardiac etiology suspected. Sudden onset of weakness today. EXAM: CT ANGIOGRAPHY HEAD AND NECK TECHNIQUE: Multidetector CT imaging of the head and neck was performed using the standard protocol during bolus administration of intravenous contrast. Multiplanar CT image reconstructions and MIPs were obtained to evaluate the vascular anatomy. Carotid stenosis measurements (when applicable) are obtained utilizing NASCET criteria, using the distal internal carotid diameter  as the denominator. CONTRAST:  30mL OMNIPAQUE IOHEXOL 350 MG/ML SOLN COMPARISON:  Head CT 01/09/2016 FINDINGS: CT HEAD FINDINGS Brain: New from prior head CT 01/09/2016, there is a chronic appearing cortical/subcortical infarct within the anterolateral left frontal lobe. No other demarcated cortical infarction is identified. No evidence of intracranial mass. No midline shift or extra-axial fluid collection. Ill-defined hypoattenuation of the cerebral white matter consistent with chronic small vessel ischemic disease. Moderate generalized parenchymal atrophy. Vascular: Reported separately Skull: Normal. Negative for fracture or focal lesion. Sinuses: Mild ethmoid sinus mucosal thickening. No significant mastoid effusion. Orbits: Visualized orbits demonstrate no acute abnormality. Review of the MIP images confirms the above findings CTA NECK FINDINGS Aortic arch: The left vertebral artery arises directly from the aortic arch. Scattered soft and calcified plaque within the visualized aortic arch and proximal major branch vessels of the neck. The visualized aortic arch is ectatic measuring up to 3.5 cm in diameter. Right carotid system: Common carotid artery widely patent. Soft and calcified plaque at the carotid bifurcation and within the proximal right ICA with less than 50% narrowing of the proximal ICA. Distal to this, the ICA is widely patent within the neck without stenosis. Left carotid system: Common carotid artery widely patent with scattered calcified plaque. Predominantly calcified plaque at the carotid bifurcation and within the proximal left ICA. It is difficult to accurately quantify the degree of stenosis at the origin of the ICA due to prominent irregular calcified plaque. There may be greater than 50% stenosis at this site. Distal to this, the ICA is widely patent within the neck with mild scattered calcified plaque. Vertebral arteries: Right vertebral artery  slightly dominant. Mixed plaque at the  origin of the right vertebral artery with suspected mild/moderate ostial stenosis. Distal to this, the right vertebral artery is widely patent within the neck with only mild scattered calcified plaque. Calcified plaque at the origin of the left vertebral artery with suspected at least moderate ostial stenosis. Distal to this, the left vertebral artery is widely patent within the neck. Skeleton: No acute bony abnormality. Cervical spondylosis with multilevel posterior disc osteophytes, uncovertebral and facet hypertrophy. No high-grade bony spinal canal stenosis. Other neck: No soft tissue neck mass or pathologically enlarged cervical chain lymph nodes. Upper chest: No consolidation within the imaged lung apices Review of the MIP images confirms the above findings CTA HEAD FINDINGS Anterior circulation: Calcified plaque within the intracranial internal carotid arteries bilaterally. Sites of up to moderate stenosis within the cavernous right ICA. No more than mild stenosis within the left ICA. The right middle and anterior cerebral arteries are patent without significant proximal stenosis. The left middle and anterior cerebral arteries are patent without significant proximal stenosis. No intracranial aneurysm is identified. Posterior circulation: The intracranial vertebral arteries are patent without significant stenosis. Mild calcified plaque within the intracranial right vertebral artery. The basilar artery is patent without significant stenosis. Minimal calcified plaque within the proximal basilar artery. The bilateral posterior cerebral arteries are patent without significant proximal stenosis. Venous sinuses: Within limitations of contrast timing, no convincing thrombus. Anatomic variants: Posterior communicating arteries are poorly delineated bilaterally and may be hypoplastic or absent. Review of the MIP images confirms the above findings IMPRESSION: CT head: 1. Chronic appearing cortical/subcortical infarct  within the anterolateral left frontal lobe, new from prior head CT 01/09/2016. MRI may be obtained for confirmation, if clinically warranted. 2. Generalized parenchymal atrophy with chronic small vessel ischemic disease. CTA head: 1. No intracranial large vessel occlusion. 2. Intracranial atherosclerotic disease as described. Most notably, there is calcified plaque within the carotid artery siphons bilaterally with sites of up to moderate stenosis within the cavernous right ICA. No more than mild narrowing within the left ICA. CTA neck: 1. Right common and internal carotid arteries patent within the neck without significant stenosis. Mixed plaque results in less than 50% narrowing of the proximal right ICA. 2. Left common and internal carotid arteries patent within the neck. It is difficult to accurately quantify the degree of stenosis at the origin of the left internal carotid artery due to the presence of prominent irregular calcified plaque. There may be greater than 50% stenosis at this site. Consider carotid artery duplex for further evaluation. 3. Bilateral vertebral arteries patent within the neck. Plaque at the vertebral artery origins with suspected mild/moderate right ostial stenosis and at least moderate left ostial stenosis. Electronically Signed   By: Jackey Loge DO   On: 03/07/2019 22:01   Mr Brain Wo Contrast  Result Date: 03/08/2019 CLINICAL DATA:  Ataxia.  Headache. EXAM: MRI HEAD WITHOUT CONTRAST TECHNIQUE: Multiplanar, multiecho pulse sequences of the brain and surrounding structures were obtained without intravenous contrast. COMPARISON:  CTA head neck 03/07/2019 FINDINGS: Brain: Moderate atrophy. Moderate ventricular enlargement unchanged. Negative for acute infarct. Chronic microvascular ischemic change in the white matter and pons. Small chronic infarct left frontal lobe. Negative for hemorrhage or mass. No midline shift. Vascular: Normal arterial flow voids Skull and upper cervical  spine: Negative Sinuses/Orbits: Paranasal sinuses clear.  Bilateral cataract surgery Other: None IMPRESSION: Atrophy and chronic ischemia.  No acute intracranial abnormality. Electronically Signed   By: Marlan Palau M.D.  On: 03/08/2019 14:31   US Carotid Bilateral  Result Date: 03/08/2019 CLINICAL DATA:  85 year old male with carotid plaque EXAM: BILATERAL CAROTID DUPLEX ULTRASOUND TECHNIQUE: Wallace Cullens scale imaging, color Doppler and duplex ultrasound were performed of bilateral carotid and vertebral arteries in the neck. COMPARISON:  None. FINDINGS: Criteria: Quantification of carotid stenosis is based on velocity parameters that correlate the residual internal carotid diameter with NASCET-based stenosis levels, using the diameter of the distal internal carotid lumen as the denominator for stenosis measurement. The following velocity measurements were obtained: RIGHT ICA: 64/18 cm/sec CCA: 87/15 cm/sec SYSTOLIC ICA/CCA RATIO:  0.7 ECA:  113 cm/sec LEFT ICA: 73/22 cm/sec CCA: 80/15 cm/sec SYSTOLIC ICA/CCA RATIO:  0.9 ECA:  118 cm/sec RIGHT CAROTID ARTERY: Mild heterogeneous atherosclerotic plaque in the proximal internal carotid artery. By peak systolic velocity criteria, the estimated stenosis remains less than 50%. RIGHT VERTEBRAL ARTERY:  Patent with normal antegrade flow. LEFT CAROTID ARTERY: Mild heterogeneous atherosclerotic plaque in the proximal internal carotid artery. By peak systolic velocity criteria, the estimated stenosis remains less than 50%. LEFT VERTEBRAL ARTERY:  Patent with normal antegrade flow. IMPRESSION: 1. Mild (1-49%) stenosis proximal right internal carotid artery secondary to heterogenous atherosclerotic plaque. 2. Mild (1-49%) stenosis proximal left internal carotid artery secondary to heterogenous atherosclerotic plaque. 3. Vertebral arteries are patent with normal antegrade flow. Signed, Sterling Big, MD, RPVI [facility]  Radiology Electronically Signed   By: Malachy Moan M.D.   On: 03/08/2019 10:46     Assessment and Recommendation  85 y.o. male with known coronary artery disease hypertension hyperlipidemia chronic diastolic dysfunction heart failure having dizziness weakness appearing to improve without evidence of myocardial infarction or current congestive heart failure 1.  Continue medication management for hypertension control including losartan.  Amlodipine 2.  No evidence of heart failure or pulmonary edema at this time and therefore no additional diuretics necessary 3.  Continue high intensity cholesterol therapy with atorvastatin 4.  Begin ambulation and follow-up for improvements of symptoms and possible discharge home from cardiac standpoint with follow-up next week for further assessment and treatment  Signed, Arnoldo Hooker M.D. FACC

## 2019-03-09 NOTE — Evaluation (Signed)
Occupational Therapy Evaluation Patient Details Name: Scott Cortez MRN: [id number] DOB: 06/03/1933 Today's Date: 03/09/2019    History of Present Illness Pt is an 85 year old male admitted with c/o weakness and report of 6 falls in the past two months. Pt monitored for bradycardia but not significant findings with imaging.  PMH includes MI, Htn, HLD, neuropathy, glaucoma and bradycardia.   Clinical Impression   Scott Cortez was seen for OT evaluation this date. Prior to hospital admission, pt was independent in BADL management. Pt lives alone in a one level home with 4 steps to enter. He endorses a significant falls history with at least 6 falls he can remember in the past 3 months. The pt states his feet "just don't work" and he typically falls backwards or to the side. He endorses using a walking stick when outside, but otherwise ambulates in his home by holding onto counters, furniture, etc. The pt reports that [place] neices [place] the [place] and his nephew who lives next door typically assist him with biweekly shopping trips, but otherwise he is on his own for all other ADL management. Currently pt demonstrates impairments in activity tolerance, safety awareness, and balance requiring supervision with consistent cueing for functional mobility as well as minimal assistance for BADL management.  Pt would benefit from skilled OT to address noted impairments and functional limitations (see below for any additional details) in order to maximize safety and independence while minimizing falls risk and caregiver burden.  Recommend HHOT upon hospital DC to maximize pt safety and return to PLOF during meaningful occupations of daily life.     Follow Up Recommendations  Home health OT;Supervision/Assistance - 24 hour    Equipment Recommendations  3 in 1 bedside commode    Recommendations for Other Services       Precautions / Restrictions Precautions Precautions: Fall Restrictions Weight Bearing  Restrictions: No      Mobility Bed Mobility Overal bed mobility: Independent                Transfers Overall transfer level: Needs assistance Equipment used: Rolling walker (2 wheeled) Transfers: Sit to/from Stand Sit to Stand: Supervision;Min guard         General transfer comment: Consistent multimodal cueing for functional transfers this date. Otherwise does not require physical assist for mobility.    Balance Overall balance assessment: Needs assistance Sitting-balance support: Feet supported;No upper extremity supported Sitting balance-Leahy Scale: Good Sitting balance - Comments: Steady static sitting, reaching within BOS. Occasionally use of 1 UE to support self on bed during weight shift.   Standing balance support: Single extremity supported Standing balance-Leahy Scale: Fair Standing balance comment: Pt uses BUE to stabalize self on RW during funcitonal mobility. No LOB noted.                           ADL either performed or assessed with clinical judgement   ADL                                         General ADL Comments: Pt requires min assist to supervision assist for ADL mgt 2/2 decreased safety awareness, cognition and mild impulsivity. Pt is able to reach feet to adjust socks, and completes functional transfer using a RW and consistent multimodal cueing for safety/sequencing.     Vision Baseline Vision/History:  Wears glasses(Hx of bilat cataract sx.) Wears Glasses: Reading only Patient Visual Report: No change from baseline       Perception     Praxis      Pertinent Vitals/Pain Pain Assessment: No/denies pain     Hand Dominance     Extremity/Trunk Assessment Upper Extremity Assessment Upper Extremity Assessment: Overall WFL for tasks assessed(Pt grossly 4/5 BUE with no sensory or FMC deficits appreciated with assessment this date.)   Lower Extremity Assessment Lower Extremity Assessment: Overall WFL for  tasks assessed(Pt reports B feet "just don't work for me" on occasion. On this date he is able to stand and march in place. Decreased coordinated noted with transfer to room recliner.)   Cervical / Trunk Assessment Cervical / Trunk Assessment: Normal   Communication Communication Communication: HOH   Cognition Arousal/Alertness: Awake/alert Behavior During Therapy: WFL for tasks assessed/performed Overall Cognitive Status: No family/caregiver present to determine baseline cognitive functioning                                 General Comments: Pt impulsive at times. Requires multimodal cueing for following multi-step commands and sequencing motor tasks. Pleasant and conversational (at times tangential) t/o assessment.   General Comments       Exercises Other Exercises Other Exercises: Pt educated in falls prevention strategies, safe use of AE/DME for functional mobility and ADL mgt, safe transfer strategies, and routines modifications to support safety and ADL management this date. Would benefit from reinforcement and opportunity to trial AE for LB adl mgt.   Shoulder Instructions      Home Living Family/patient expects to be discharged to:: Private residence Living Arrangements: Alone Available Help at Discharge: Family;Available PRN/intermittently Type of Home: House Home Access: Stairs to enter   Entrance Stairs-Rails: Can reach both Home Layout: One level     Bathroom Shower/Tub: Occupational psychologist: Standard Bathroom Accessibility: Yes   Home Equipment: Cane - single point;Walker - standard          Prior Functioning/Environment Level of Independence: Independent with assistive device(s)        Comments: Limited community ambulator with SPC "walking stick".  Pt states he has a nephew who drives him to get groceries and he stocks up for two weeks.  Unsure if pt is a reliable historian.        OT Problem List: Decreased  strength;Decreased coordination;Decreased range of motion;Decreased cognition;Decreased activity tolerance;Decreased safety awareness;Decreased knowledge of use of DME or AE;Impaired balance (sitting and/or standing)      OT Treatment/Interventions: Self-care/ADL training;Balance training;Therapeutic exercise;Therapeutic activities;Energy conservation;DME and/or AE instruction;Patient/family education;Cognitive remediation/compensation    OT Goals(Current goals can be found in the care plan section) Acute Rehab OT Goals Patient Stated Goal: To return home and figure out what is causing his falls. OT Goal Formulation: With patient Time For Goal Achievement: 03/23/19 Potential to Achieve Goals: Good ADL Goals Pt Will Transfer to Toilet: ambulating;bedside commode;regular height toilet;with set-up;with supervision(With LRAD PRN for improved safety and functional independence) Pt Will Perform Toileting - Clothing Manipulation and hygiene: sit to/from stand;with min guard assist;with adaptive equipment(With LRAD PRN for improved safety and functional independence) Additional ADL Goal #1: Pt will independently verbalize a plan to implement at least 3 learned falls prevention strategies into his daily routines/home environment for improved safety and functional independence upon hospital DC.  OT Frequency: Min 2X/week   Barriers to D/C: Inaccessible home  environment;Decreased caregiver support  Pt has neices/newphews who assist only intermittently; Also has steps to enter the home.       Co-evaluation              AM-PAC OT "6 Clicks" Daily Activity     Outcome Measure Help from another person eating meals?: None Help from another person taking care of personal grooming?: A Little Help from another person toileting, which includes using toliet, bedpan, or urinal?: A Little Help from another person bathing (including washing, rinsing, drying)?: A Little Help from another person to put on  and taking off regular upper body clothing?: A Little Help from another person to put on and taking off regular lower body clothing?: A Little 6 Click Score: 19   End of Session Equipment Utilized During Treatment: Gait belt;Rolling walker Nurse Communication: Mobility status;Other (comment)(Pt requesting assistance with eye drops.)  Activity Tolerance: Patient tolerated treatment well Patient left: in chair;with chair alarm set;with call bell/phone within reach  OT Visit Diagnosis: Other abnormalities of gait and mobility (R26.89);Repeated falls (R29.6)                Time: 3500-9381 OT Time Calculation (min): 46 min Charges:  OT General Charges $OT Visit: 1 Visit OT Evaluation $OT Eval Moderate Complexity: 1 Mod OT Treatments $Self Care/Home Management : 23-37 mins  Rockney Ghee, M.S., OTR/L Ascom: [phone] 03/09/19, 1:41 PM

## 2019-03-10 DIAGNOSIS — R531 Weakness: Secondary | ICD-10-CM

## 2019-03-10 LAB — VITAMIN B12: Vitamin B-12: 165 pg/mL — ABNORMAL LOW (ref 180–914)

## 2019-03-10 LAB — SEDIMENTATION RATE: Sed Rate: 7 mm/hr (ref 0–20)

## 2019-03-10 LAB — GLUCOSE, CAPILLARY: Glucose-Capillary: 108 mg/dL — ABNORMAL HIGH (ref 70–99)

## 2019-03-10 LAB — FOLATE: Folate: 23 ng/mL (ref >5.9)

## 2019-03-10 MED ORDER — METOPROLOL TARTRATE 25 MG PO TABS: 25.0000 mg | ORAL_TABLET | Freq: Two times a day (BID) | ORAL | Status: DC

## 2019-03-10 MED ORDER — DILTIAZEM HCL ER COATED BEADS 120 MG PO CP24
120.0000 mg | ORAL_CAPSULE | Freq: Every day | ORAL | Status: DC
  Administered 2019-03-10 – 2019-03-12 (×3): 120 mg via ORAL
  Filled 2019-03-10 (×3): qty 1

## 2019-03-10 MED ORDER — ASPIRIN EC 81 MG PO TBEC
81.0000 mg | DELAYED_RELEASE_TABLET | Freq: Every day | ORAL | Status: DC
  Administered 2019-03-11: 81 mg via ORAL
  Filled 2019-03-10 (×2): qty 1

## 2019-03-10 MED ORDER — METOPROLOL TARTRATE 5 MG/5ML IV SOLN
5.0000 mg | INTRAVENOUS | Status: DC | PRN
  Administered 2019-03-10: 5 mg via INTRAVENOUS

## 2019-03-10 MED ORDER — APIXABAN 5 MG PO TABS
5.0000 mg | ORAL_TABLET | Freq: Two times a day (BID) | ORAL | Status: DC
  Administered 2019-03-10: 5 mg via ORAL
  Filled 2019-03-10: qty 1

## 2019-03-10 MED ORDER — LOSARTAN POTASSIUM 25 MG PO TABS: 25.0000 mg | ORAL_TABLET | Freq: Every day | ORAL | Status: DC

## 2019-03-10 MED ORDER — METOPROLOL TARTRATE 5 MG/5ML IV SOLN
INTRAVENOUS | Status: AC
  Filled 2019-03-10: qty 5

## 2019-03-10 MED ORDER — POLYETHYLENE GLYCOL 3350 17 G PO PACK
17.0000 g | PACK | Freq: Every day | ORAL | Status: DC
  Administered 2019-03-10 – 2019-03-11 (×2): 17 g via ORAL
  Filled 2019-03-10 (×3): qty 1

## 2019-03-10 NOTE — Consult Note (Signed)
Reason for Consult:BLE weakness Referring Physician: Gouru  CC: BLE weakness  HPI: Scott Cortez is an 85 y.o. male with a history of CAD, MI, GERD, bladder neck obstruction, chronic pancreatitis, hyperlipidemia, hypertension, and bradycardia presenting to the ED with near syncope, dizziness and generalized weakness.  Patient found to be orthostatic and with bradycardia.  Also with complaints of lower extremity weakness and falls.  Reports his feet are burning at times.  Feels that his balance is off.  Trips over things easily.     Past Medical History:  Diagnosis Date  . Bladder neck obstruction   . Bradycardia   . CHF (congestive heart failure) ([place])   . Chronic pancreatitis ([place])   . Coronary artery disease   . Dental bridge present    upper - bilateral  . GERD (gastroesophageal reflux disease)   . Glaucoma   . Hyperlipidemia   . Hypertension   . Myocardial infarction ([place])   . Neuropathy    feet    Past Surgical History:  Procedure Laterality Date  . CATARACT EXTRACTION W/PHACO Left 02/02/2019   Procedure: CATARACT EXTRACTION PHACO AND INTRAOCULAR LENS PLACEMENT (IOC) LEFT ISTENT INJ AND ISTENT 01:09.1        11.3%          8.47;  Surgeon: [name], MD;  Location: [place];  Service: Ophthalmology;  Laterality: Left;  . CATARACT EXTRACTION W/PHACO Right 02/23/2019   Procedure: CATARACT EXTRACTION PHACO AND INTRAOCULAR LENS PLACEMENT (IOC) RIGHT ISTENT INJ 01:09.1  14.1%  9.78;  Surgeon: [name], MD;  Location: [place];  Service: Ophthalmology;  Laterality: Right;  . COLONOSCOPY N/A 10/15/2014   Procedure: COLONOSCOPY;  Surgeon: [name], MD;  Location: [facility] ENDOSCOPY;  Service: Endoscopy;  Laterality: N/A;  . CORONARY ANGIOPLASTY     3 stents  . coronary stenting     2007 (2), 2008 (1)  . ESOPHAGOGASTRODUODENOSCOPY (EGD) WITH PROPOFOL N/A 10/15/2014   Procedure: ESOPHAGOGASTRODUODENOSCOPY (EGD) WITH PROPOFOL;  Surgeon:  [name], MD;  Location[facility] ENDOSCOPY;  Service: Endoscopy;  Laterality: N/A;  . INSERTION OF ANTERIOR SEGMENT AQUEOUS DRAINAGE DEVICE (ISTENT) Left 02/02/2019   Procedure: INSERTION OF ANTERIOR SEGMENT AQUEOUS DRAINAGE DEVICE (ISTENT);  Surgeon: [name], MD;  Location: [place];  Service: Ophthalmology;  Laterality: Left;  . SAVORY DILATION N/A 10/15/2014   Procedure: SAVORY DILATION;  Surgeon: [name], MD;  Location: [facility] ENDOSCOPY;  Service: Endoscopy;  Laterality: N/A;    Family history: 2 brothers with PD  Social History:  reports that he quit smoking about 45 years ago. He has never used smokeless tobacco. He reports previous drug use. Drug: Marijuana. He reports that he does not drink alcohol.  Allergies  Allergen Reactions  . Pneumococcal Vaccines Swelling    Medications:  I have reviewed the patient's current medications. Prior to Admission:  Medications Prior to Admission  Medication Sig Dispense Refill Last Dose  . amLODipine (NORVASC) 5 MG tablet Take 5 mg by mouth daily.   03/07/2019 at 0800  . aspirin 325 MG tablet Take 325 mg by mouth daily.   03/07/2019 at Unknown time  . clopidogrel (PLAVIX) 75 MG tablet Take 75 mg by mouth daily.   03/07/2019 at 0800  . losartan (COZAAR) 50 MG tablet Take 50 mg by mouth daily.   03/07/2019 at 0800  . omeprazole (PRILOSEC) 20 MG capsule Take 20 mg by mouth daily.   03/07/2019 at 0800  .  sildenafil (VIAGRA) 100 MG tablet Take 100 mg by mouth as directed.   prn at prn  . tamsulosin (FLOMAX) 0.4 MG CAPS capsule Take 0.4 mg by mouth daily.   03/07/2019 at 0800  . atorvastatin (LIPITOR) 20 MG tablet Take 20 mg by mouth daily.     . brimonidine (ALPHAGAN) 0.2 % ophthalmic solution Place 1 drop into both eyes 2 (two) times daily.     Marland Kitchen latanoprost (XALATAN) 0.005 % ophthalmic solution Place 1 drop into both eyes every morning.     . ranitidine (ZANTAC) 150 MG capsule Take 150 mg by mouth daily.   Not  Taking at Unknown time  . timolol (BETIMOL) 0.5 % ophthalmic solution Place 1 drop into both eyes 2 (two) times daily.      Scheduled: . amLODipine  5 mg Oral Daily  . aspirin EC  325 mg Oral Daily  . atorvastatin  20 mg Oral Daily  . clopidogrel  75 mg Oral Daily  . enoxaparin (LOVENOX) injection  40 mg Subcutaneous Q24H  . famotidine  20 mg Oral QHS  . losartan  50 mg Oral Daily  . pantoprazole  40 mg Oral Daily  . prednisoLONE-Gatifloxacin  1 drop Both Eyes BID  . sodium chloride flush  3 mL Intravenous Q12H  . tamsulosin  0.4 mg Oral Daily    ROS: History obtained from the patient  General ROS: negative for - chills, fatigue, fever, night sweats, weight gain or weight loss Psychological ROS: negative for - behavioral disorder, hallucinations, memory difficulties, mood swings or suicidal ideation Ophthalmic ROS: negative for - blurry vision, double vision, eye pain or loss of vision ENT ROS: negative for - epistaxis, nasal discharge, oral lesions, sore throat, tinnitus or vertigo Allergy and Immunology ROS: negative for - hives or itchy/watery eyes Hematological and Lymphatic ROS: negative for - bleeding problems, bruising or swollen lymph nodes Endocrine ROS: negative for - galactorrhea, hair pattern changes, polydipsia/polyuria or temperature intolerance Respiratory ROS: negative for - cough, hemoptysis, shortness of breath or wheezing Cardiovascular ROS: BLE edema Gastrointestinal ROS: negative for - abdominal pain, diarrhea, hematemesis, nausea/vomiting or stool incontinence Genito-Urinary ROS: negative for - dysuria, hematuria, incontinence or urinary frequency/urgency Musculoskeletal ROS: negative for - joint swelling or muscular weakness Neurological ROS: as noted in HPI Dermatological ROS: negative for rash and skin lesion changes  Physical Examination: Blood pressure 119/64, pulse (!) 56, temperature 98 F (36.7 C), temperature source Oral, resp. rate 19, height _0   (1.753 m), weight 80 kg, SpO2 97 %.  HEENT-  Normocephalic, no lesions, without obvious abnormality.  Normal external eye and conjunctiva.  Normal TM's bilaterally.  Normal auditory canals and external ears. Normal external nose, mucus membranes and septum.  Normal pharynx. Cardiovascular- S1, S2 normal, pulses palpable throughout   Lungs- chest clear, no wheezing, rales, normal symmetric air entry Abdomen- soft, non-tender; bowel sounds normal; no masses,  no organomegaly Extremities- mild BLE edema Lymph-no adenopathy palpable Musculoskeletal-no joint tenderness, deformity or swelling Skin-warm and dry, no hyperpigmentation, vitiligo, or suspicious lesions  Neurological Examination   Mental Status: Alert, oriented, thought content appropriate.  Speech fluent without evidence of aphasia.  Able to follow 3 step commands without difficulty. Cranial Nerves: II: Discs flat bilaterally; Visual fields grossly normal, pupils equal, round, reactive to light and accommodation III,IV, VI: ptosis not present, extra-ocular motions intact bilaterally V,VII: smile symmetric, facial light touch sensation normal bilaterally VIII: hearing normal bilaterally IX,X: gag reflex present XI: bilateral shoulder shrug XII:  midline tongue extension Motor: Right : Upper extremity   5/5    Left:     Upper extremity   5/5  Lower extremity   5/5     Lower extremity   5/5 Mild increase in tone in the upper extremities; no atrophy noted Sensory: Pinprick and light touch decreased in the feet bilaterally.  Decreased vibration to the hip on the right and to the knee on the left.   Deep Tendon Reflexes: 2+ throughout with absent AJ on the right Plantars: Right: downgoing   Left: equivocal Cerebellar: Normal finger-to-nose testing bilaterally.  Mild dysmetria with heel-to-shin testing using the RLE Gait: not tested due to safety concerns   Laboratory Studies:   Basic Metabolic Panel: Recent Labs  Lab  03/07/19 1331 03/08/19 0531  NA 138 139  K 4.0 3.8  CL 102 104  CO2 26 26  GLUCOSE 149* 110*  BUN 13 11  CREATININE 1.09 0.65  CALCIUM 9.3 9.1    Liver Function Tests: Recent Labs  Lab 03/07/19 1331  AST 25  ALT 13  ALKPHOS 82  BILITOT 1.7*  PROT 6.7  ALBUMIN 3.9   No results for input(s): LIPASE, AMYLASE in the last 168 hours. No results for input(s): AMMONIA in the last 168 hours.  CBC: Recent Labs  Lab 03/07/19 1331 03/08/19 0531  WBC 4.7 4.7  NEUTROABS 3.2  --   HGB 13.7 13.1  HCT 40.5 39.0  MCV 91.0 91.1  PLT 174 180    Cardiac Enzymes: No results for input(s): CKTOTAL, CKMB, CKMBINDEX, TROPONINI in the last 168 hours.  BNP: Invalid input(s): POCBNP  CBG: Recent Labs  Lab 03/09/19 0540 03/10/19 0800  GLUCAP 84 108*    Microbiology: Results for orders placed or performed during the hospital encounter of 03/07/19  SARS CORONAVIRUS 2 (TAT 6-24 HRS) Nasopharyngeal Nasopharyngeal Swab     Status: None   Collection Time: 03/07/19  6:59 PM   Specimen: Nasopharyngeal Swab  Result Value Ref Range Status   SARS Coronavirus 2 NEGATIVE NEGATIVE Final    Comment: (NOTE) SARS-CoV-2 target nucleic acids are NOT DETECTED. The SARS-CoV-2 RNA is generally detectable in upper and lower respiratory specimens during the acute phase of infection. Negative results do not preclude SARS-CoV-2 infection, do not rule out co-infections with other pathogens, and should not be used as the sole basis for treatment or other patient management decisions. Negative results must be combined with clinical observations, patient history, and epidemiological information. The expected result is Negative. Fact Sheet for Patients: SugarRoll.be Fact Sheet for Healthcare Providers: https://www.woods-mathews.com/ This test is not yet approved or cleared by the [place] FDA and  has been authorized for detection and/or diagnosis of  SARS-CoV-2 by FDA under an Emergency Use Authorization (EUA). This EUA will remain  in effect (meaning this test can be used) for the duration of the COVID-19 declaration under Section 56 4(b)(1) of the Act, 21 U.S.C. section 360bbb-3(b)(1), unless the authorization is terminated or revoked sooner. Performed at [facility], [place] [address], [place], [place] [address]     Coagulation Studies: No results for input(s): LABPROT, INR in the last 72 hours.  Urinalysis:  Recent Labs  Lab 03/07/19 1532  COLORURINE YELLOW*  LABSPEC 1.013  PHURINE 6.0  GLUCOSEU NEGATIVE  HGBUR NEGATIVE  BILIRUBINUR NEGATIVE  KETONESUR NEGATIVE  PROTEINUR NEGATIVE  NITRITE NEGATIVE  LEUKOCYTESUR NEGATIVE    Lipid Panel:  No results found for: CHOL, TRIG, HDL, CHOLHDL, VLDL, LDLCALC  HgbA1C: No results found for: HGBA1C  Urine Drug Screen:  No results found for: LABOPIA, COCAINSCRNUR, LABBENZ, AMPHETMU, THCU, LABBARB  Alcohol Level: No results for input(s): ETH in the last 168 hours.  Other results: EKG: sinus rhythm at 72 bpm.  Imaging: Mr Brain Wo Contrast  Result Date: 03/08/2019 CLINICAL DATA:  Ataxia.  Headache. EXAM: MRI HEAD WITHOUT CONTRAST TECHNIQUE: Multiplanar, multiecho pulse sequences of the brain and surrounding structures were obtained without intravenous contrast. COMPARISON:  CTA head neck 03/07/2019 FINDINGS: Brain: Moderate atrophy. Moderate ventricular enlargement unchanged. Negative for acute infarct. Chronic microvascular ischemic change in the white matter and pons. Small chronic infarct left frontal lobe. Negative for hemorrhage or mass. No midline shift. Vascular: Normal arterial flow voids Skull and upper cervical spine: Negative Sinuses/Orbits: Paranasal sinuses clear.  Bilateral cataract surgery Other: None IMPRESSION: Atrophy and chronic ischemia.  No acute intracranial abnormality. Electronically Signed   By: Franchot Gallo M.D.   On: 03/08/2019 14:31   Mr  Lumbar Spine W Wo Contrast  Result Date: 03/09/2019 CLINICAL DATA:  Generalized muscle weakness.  Multiple recent falls. EXAM: MRI LUMBAR SPINE WITHOUT AND WITH CONTRAST TECHNIQUE: Multiplanar and multiecho pulse sequences of the lumbar spine were obtained without and with intravenous contrast. CONTRAST:  51m GADAVIST GADOBUTROL 1 MMOL/ML IV SOLN COMPARISON:  CT abdomen and pelvis 01/09/2016 FINDINGS: Segmentation:  Standard. Alignment:  Normal. Vertebrae: No fracture or suspicious marrow lesion. Small L3 superior endplate Schmorl's node. Degenerative endplate changes from LZ6-X0 predominantly Modic type 2. Conus medullaris and cauda equina: Conus extends to the L1 level. Conus and cauda equina appear normal. Paraspinal and other soft tissues: Unremarkable. Disc levels: Disc desiccation throughout the lumbar spine. Mild disc space narrowing at L2-3 and L3-4 with moderate narrowing at L4-5 and severe narrowing at L5-S1. T12-L1 negative. L1-2: Slight facet hypertrophy without disc herniation or stenosis. L2-3: Mild disc bulging slightly eccentric to the left and mild facet and ligamentum flavum hypertrophy without stenosis. L3-4: Disc bulging eccentric to the right and moderate facet and ligamentum flavum hypertrophy result in mild right lateral recess stenosis and mild-to-moderate right and mild left neural foraminal stenosis without significant spinal stenosis. L4-5: Disc bulging, endplate spurring, and moderate facet and ligamentum flavum hypertrophy result in moderate right and mild left neural foraminal stenosis without spinal stenosis. L5-S1: Disc bulging, endplate spurring, and mild facet hypertrophy result in mild left greater than right neural foraminal stenosis without spinal stenosis. IMPRESSION: Multilevel lumbar disc and facet degeneration with mild-to-moderate neural foraminal stenosis as above. No significant spinal stenosis. Electronically Signed   By: ALogan BoresM.D.   On: 03/09/2019 15:03   [place] Carotid Bilateral  Result Date: 03/08/2019 CLINICAL DATA:  85year old male with carotid plaque EXAM: BILATERAL CAROTID DUPLEX ULTRASOUND TECHNIQUE: GPearline Cablesscale imaging, color Doppler and duplex ultrasound were performed of bilateral carotid and vertebral arteries in the neck. COMPARISON:  None. FINDINGS: Criteria: Quantification of carotid stenosis is based on velocity parameters that correlate the residual internal carotid diameter with NASCET-based stenosis levels, using the diameter of the distal internal carotid lumen as the denominator for stenosis measurement. The following velocity measurements were obtained: RIGHT ICA: 64/18 cm/sec CCA: 896/04cm/sec SYSTOLIC ICA/CCA RATIO:  0.7 ECA:  113 cm/sec LEFT ICA: 73/22 cm/sec CCA: 854/09cm/sec SYSTOLIC ICA/CCA RATIO:  0.9 ECA:  118 cm/sec RIGHT CAROTID ARTERY: Mild heterogeneous atherosclerotic plaque in the proximal internal carotid artery. By peak systolic velocity criteria, the estimated stenosis remains less than 50%. RIGHT VERTEBRAL ARTERY:  Patent with normal antegrade flow. LEFT CAROTID ARTERY: Mild heterogeneous atherosclerotic plaque in the proximal internal carotid artery. By peak systolic velocity criteria, the estimated stenosis remains less than 50%. LEFT VERTEBRAL ARTERY:  Patent with normal antegrade flow. IMPRESSION: 1. Mild (1-49%) stenosis proximal right internal carotid artery secondary to heterogenous atherosclerotic plaque. 2. Mild (1-49%) stenosis proximal left internal carotid artery secondary to heterogenous atherosclerotic plaque. 3. Vertebral arteries are patent with normal antegrade flow. Signed, Criselda Peaches, MD, [facility] Radiology Electronically Signed   By: Jacqulynn Cadet M.D.   On: 03/08/2019 10:46     Assessment/Plan: 86 year old male admitted with complaints of dizziness and generalized weakness.  Orthostatic and bradycardic on presentation.  Also complains of  LE weakness and difficulty with gait.  MRI of the brain reviewed and shows no acute changes.  MRI of the lumbar spine shows multilevel neuroforaminal stenosis.  No significant signs of PD on neurological examination.  No evidence of LE weakness.  Neurological examination consistent with peripheral neuropathy which may the cause of the complaints.  TSH normal.    Recommendations: 1. B12, folate, B1, heavy metal screen, SPEP, ESR 2. Patient to keep scheduled appointment with neurology on an outpatient basis  Alexis Goodell, MD Neurology [phone] 03/10/2019, 9:13 AM

## 2019-03-10 NOTE — Progress Notes (Signed)
[facility] Cardiology [facility] Encounter Note  Patient: Scott Cortez / Admit Date: 03/07/2019 / Date of Encounter: 03/10/2019, 12:44 PM   Subjective: Patient is ambulating well without evidence of significant symptoms or issues of congestive heart failure or angina.    Patient has had full neurologic work-up with no evidence of changes by brain MRI or neurologic consultation other than possible peripheral neuropathy New telemetry changes with acute onset of atrial fibrillation with rapid ventricular rate with palpitations but no evidence chest discomfort or shortness of breath or congestive heart failure .  Troponin is flat at peak of 54.  Hypertension reasonably controlled.   Echocardiogram showing normal LV systolic function ejection fraction of 55 to 60% with no evidence of significant valvular heart disease.  Review of Systems: Positive for: Palpitations shortness of breath Negative for: Vision change, hearing change, syncope, dizziness, nausea, vomiting,diarrhea, bloody stool, stomach pain, cough, congestion, diaphoresis, urinary frequency, urinary pain,skin lesions, skin rashes Others previously listed  Objective: Telemetry: Atrial fibrillation with rapid ventricular rate Physical Exam: Blood pressure (!) 121/93, pulse (!) 113, temperature 98 F (36.7 C), temperature source Oral, resp. rate 19, height  (1.753 m), weight 80 kg, SpO2 98 %. Body mass index is 26.03 kg/m. General: Well developed, well nourished, in no acute distress. Head: Normocephalic, atraumatic, sclera non-icteric, no xanthomas, nares are without discharge. Neck: No apparent masses Lungs: Normal respirations with no wheezes, no rhonchi, no rales , no crackles   Heart: irregular rate and rhythm, normal S1 S2, no murmur, no rub, no gallop, PMI is normal size and placement, carotid upstroke normal without bruit, jugular venous pressure normal Abdomen: Soft, non-tender, non-distended with normoactive bowel  sounds. No hepatosplenomegaly. Abdominal aorta is normal size without bruit Extremities: No edema, no clubbing, no cyanosis, no ulcers,  Peripheral: 2+ radial, 2+ femoral, 2+ dorsal pedal pulses Neuro: Alert and oriented. Moves all extremities spontaneously. Psych:  Responds to questions appropriately with a normal affect.   Intake/Output Summary (Last 24 hours) at 03/10/2019 1244 Last data filed at 03/10/2019 0702 Gross per 24 hour  Intake 859.29 ml  Output 1750 ml  Net -890.71 ml    Inpatient Medications:  . aspirin EC  325 mg Oral Daily  . atorvastatin  20 mg Oral Daily  . clopidogrel  75 mg Oral Daily  . enoxaparin (LOVENOX) injection  40 mg Subcutaneous Q24H  . famotidine  20 mg Oral QHS  . [START ON 03/11/2019] losartan  25 mg Oral Daily  . metoprolol tartrate  25 mg Oral BID  . pantoprazole  40 mg Oral Daily  . polyethylene glycol  17 g Oral Daily  . prednisoLONE-Gatifloxacin  1 drop Both Eyes BID  . sodium chloride flush  3 mL Intravenous Q12H  . tamsulosin  0.4 mg Oral Daily   Infusions:    Labs: Recent Labs    03/07/19 1331 03/08/19 0531  NA 138 139  K 4.0 3.8  CL 102 104  CO2 26 26  GLUCOSE 149* 110*  BUN 13 11  CREATININE 1.09 0.65  CALCIUM 9.3 9.1   Recent Labs    03/07/19 1331  AST 25  ALT 13  ALKPHOS 82  BILITOT 1.7*  PROT 6.7  ALBUMIN 3.9   Recent Labs    03/07/19 1331 03/08/19 0531  WBC 4.7 4.7  NEUTROABS 3.2  --   HGB 13.7 13.1  HCT 40.5 39.0  MCV 91.0 91.1  PLT 174 180   No results for input(s): CKTOTAL, CKMB,  TROPONINI in the last 72 hours. Invalid input(s): POCBNP No results for input(s): HGBA1C in the last 72 hours.   Weights: Filed Weights   03/08/19 0456 03/09/19 0150 03/10/19 0427  Weight: 80.9 kg 79.8 kg 80 kg     Radiology/Studies:  Ct Angio Head W Or Wo Contrast  Result Date: 03/07/2019 CLINICAL DATA:  Syncope/fainting, cardiac etiology suspected. Sudden onset of weakness today. EXAM: CT ANGIOGRAPHY HEAD AND  NECK TECHNIQUE: Multidetector CT imaging of the head and neck was performed using the standard protocol during bolus administration of intravenous contrast. Multiplanar CT image reconstructions and MIPs were obtained to evaluate the vascular anatomy. Carotid stenosis measurements (when applicable) are obtained utilizing NASCET criteria, using the distal internal carotid diameter as the denominator. CONTRAST:  31mL OMNIPAQUE IOHEXOL 350 MG/ML SOLN COMPARISON:  Head CT 01/09/2016 FINDINGS: CT HEAD FINDINGS Brain: New from prior head CT 01/09/2016, there is a chronic appearing cortical/subcortical infarct within the anterolateral left frontal lobe. No other demarcated cortical infarction is identified. No evidence of intracranial mass. No midline shift or extra-axial fluid collection. Ill-defined hypoattenuation of the cerebral white matter consistent with chronic small vessel ischemic disease. Moderate generalized parenchymal atrophy. Vascular: Reported separately Skull: Normal. Negative for fracture or focal lesion. Sinuses: Mild ethmoid sinus mucosal thickening. No significant mastoid effusion. Orbits: Visualized orbits demonstrate no acute abnormality. Review of the MIP images confirms the above findings CTA NECK FINDINGS Aortic arch: The left vertebral artery arises directly from the aortic arch. Scattered soft and calcified plaque within the visualized aortic arch and proximal major branch vessels of the neck. The visualized aortic arch is ectatic measuring up to 3.5 cm in diameter. Right carotid system: Common carotid artery widely patent. Soft and calcified plaque at the carotid bifurcation and within the proximal right ICA with less than 50% narrowing of the proximal ICA. Distal to this, the ICA is widely patent within the neck without stenosis. Left carotid system: Common carotid artery widely patent with scattered calcified plaque. Predominantly calcified plaque at the carotid bifurcation and within the  proximal left ICA. It is difficult to accurately quantify the degree of stenosis at the origin of the ICA due to prominent irregular calcified plaque. There may be greater than 50% stenosis at this site. Distal to this, the ICA is widely patent within the neck with mild scattered calcified plaque. Vertebral arteries: Right vertebral artery slightly dominant. Mixed plaque at the origin of the right vertebral artery with suspected mild/moderate ostial stenosis. Distal to this, the right vertebral artery is widely patent within the neck with only mild scattered calcified plaque. Calcified plaque at the origin of the left vertebral artery with suspected at least moderate ostial stenosis. Distal to this, the left vertebral artery is widely patent within the neck. Skeleton: No acute bony abnormality. Cervical spondylosis with multilevel posterior disc osteophytes, uncovertebral and facet hypertrophy. No high-grade bony spinal canal stenosis. Other neck: No soft tissue neck mass or pathologically enlarged cervical chain lymph nodes. Upper chest: No consolidation within the imaged lung apices Review of the MIP images confirms the above findings CTA HEAD FINDINGS Anterior circulation: Calcified plaque within the intracranial internal carotid arteries bilaterally. Sites of up to moderate stenosis within the cavernous right ICA. No more than mild stenosis within the left ICA. The right middle and anterior cerebral arteries are patent without significant proximal stenosis. The left middle and anterior cerebral arteries are patent without significant proximal stenosis. No intracranial aneurysm is identified. Posterior circulation: The intracranial vertebral arteries  are patent without significant stenosis. Mild calcified plaque within the intracranial right vertebral artery. The basilar artery is patent without significant stenosis. Minimal calcified plaque within the proximal basilar artery. The bilateral posterior cerebral  arteries are patent without significant proximal stenosis. Venous sinuses: Within limitations of contrast timing, no convincing thrombus. Anatomic variants: Posterior communicating arteries are poorly delineated bilaterally and may be hypoplastic or absent. Review of the MIP images confirms the above findings IMPRESSION: CT head: 1. 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 MRI may be obtained for confirmation, if clinically warranted. 2. Generalized parenchymal atrophy with chronic small vessel ischemic disease. CTA head: 1. No intracranial large vessel occlusion. 2. Intracranial atherosclerotic disease as described. Most notably, there is calcified plaque within the carotid artery siphons bilaterally with sites of up to moderate stenosis within the cavernous right ICA. No more than mild narrowing within the left ICA. CTA neck: 1. Right common and internal carotid arteries patent within the neck without significant stenosis. Mixed plaque results in less than 50% narrowing of the proximal right ICA. 2. Left common and internal carotid arteries patent within the neck. It is difficult to accurately quantify the degree of stenosis at the origin of the left internal carotid artery due to the presence of prominent irregular calcified plaque. There may be greater than 50% stenosis at this site. Consider carotid artery duplex for further evaluation. 3. Bilateral vertebral arteries patent within the neck. Plaque at the vertebral artery origins with suspected mild/moderate right ostial stenosis and at least moderate left ostial stenosis. Electronically Signed   By: Jackey Loge DO   On: 03/07/2019 22:01   Ct Angio Neck W Or Wo Contrast  Result Date: 03/07/2019 CLINICAL DATA:  Syncope/fainting, cardiac etiology suspected. Sudden onset of weakness today. EXAM: CT ANGIOGRAPHY HEAD AND NECK TECHNIQUE: Multidetector CT imaging of the head and  neck was performed using the standard protocol during bolus administration of intravenous contrast. Multiplanar CT image reconstructions and MIPs were obtained to evaluate the vascular anatomy. Carotid stenosis measurements (when applicable) are obtained utilizing NASCET criteria, using the distal internal carotid diameter as the denominator. CONTRAST:  75mL OMNIPAQUE IOHEXOL 350 MG/ML SOLN COMPARISON:  Head CT 01/09/2016 FINDINGS: CT HEAD FINDINGS Brain: New from prior head CT 01/09/2016, there is a chronic appearing cortical/subcortical infarct within the anterolateral left frontal lobe. No other demarcated cortical infarction is identified. No evidence of intracranial mass. No midline shift or extra-axial fluid collection. Ill-defined hypoattenuation of the cerebral white matter consistent with chronic small vessel ischemic disease. Moderate generalized parenchymal atrophy. Vascular: Reported separately Skull: Normal. Negative for fracture or focal lesion. Sinuses: Mild ethmoid sinus mucosal thickening. No significant mastoid effusion. Orbits: Visualized orbits demonstrate no acute abnormality. Review of the MIP images confirms the above findings CTA NECK FINDINGS Aortic arch: The left vertebral artery arises directly from the aortic arch. Scattered soft and calcified plaque within the visualized aortic arch and proximal major branch vessels of the neck. The visualized aortic arch is ectatic measuring up to 3.5 cm in diameter. Right carotid system: Common carotid artery widely patent. Soft and calcified plaque at the carotid bifurcation and within the proximal right ICA with less than 50% narrowing of the proximal ICA. Distal to this, the ICA is widely patent within the neck without stenosis. Left carotid system: Common carotid artery widely patent with scattered calcified plaque. Predominantly calcified plaque at the carotid bifurcation and within the proximal left ICA. It  is difficult to accurately quantify the  degree of stenosis at the origin of the ICA due to prominent irregular calcified plaque. There may be greater than 50% stenosis at this site. Distal to this, the ICA is widely patent within the neck with mild scattered calcified plaque. Vertebral arteries: Right vertebral artery slightly dominant. Mixed plaque at the origin of the right vertebral artery with suspected mild/moderate ostial stenosis. Distal to this, the right vertebral artery is widely patent within the neck with only mild scattered calcified plaque. Calcified plaque at the origin of the left vertebral artery with suspected at least moderate ostial stenosis. Distal to this, the left vertebral artery is widely patent within the neck. Skeleton: No acute bony abnormality. Cervical spondylosis with multilevel posterior disc osteophytes, uncovertebral and facet hypertrophy. No high-grade bony spinal canal stenosis. Other neck: No soft tissue neck mass or pathologically enlarged cervical chain lymph nodes. Upper chest: No consolidation within the imaged lung apices Review of the MIP images confirms the above findings CTA HEAD FINDINGS Anterior circulation: Calcified plaque within the intracranial internal carotid arteries bilaterally. Sites of up to moderate stenosis within the cavernous right ICA. No more than mild stenosis within the left ICA. The right middle and anterior cerebral arteries are patent without significant proximal stenosis. The left middle and anterior cerebral arteries are patent without significant proximal stenosis. No intracranial aneurysm is identified. Posterior circulation: The intracranial vertebral arteries are patent without significant stenosis. Mild calcified plaque within the intracranial right vertebral artery. The basilar artery is patent without significant stenosis. Minimal calcified plaque within the proximal basilar artery. The bilateral posterior cerebral arteries are patent without significant proximal stenosis. Venous  sinuses: Within limitations of contrast timing, no convincing thrombus. Anatomic variants: Posterior communicating arteries are poorly delineated bilaterally and may be hypoplastic or absent. Review of the MIP images confirms the above findings IMPRESSION: CT head: 1. 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 MRI may be obtained for confirmation, if clinically warranted. 2. Generalized parenchymal atrophy with chronic small vessel ischemic disease. CTA head: 1. No intracranial large vessel occlusion. 2. Intracranial atherosclerotic disease as described. Most notably, there is calcified plaque within the carotid artery siphons bilaterally with sites of up to moderate stenosis within the cavernous right ICA. No more than mild narrowing within the left ICA. CTA neck: 1. Right common and internal carotid arteries patent within the neck without significant stenosis. Mixed plaque results in less than 50% narrowing of the proximal right ICA. 2. Left common and internal carotid arteries patent within the neck. It is difficult to accurately quantify the degree of stenosis at the origin of the left internal carotid artery due to the presence of prominent irregular calcified plaque. There may be greater than 50% stenosis at this site. Consider carotid artery duplex for further evaluation. 3. Bilateral vertebral arteries patent within the neck. Plaque at the vertebral artery origins with suspected mild/moderate right ostial stenosis and at least moderate left ostial stenosis. Electronically Signed   By: Jackey Loge DO   On: 03/07/2019 22:01   Mr Brain Wo Contrast  Result Date: 03/08/2019 CLINICAL DATA:  Ataxia.  Headache. EXAM: MRI HEAD WITHOUT CONTRAST TECHNIQUE: Multiplanar, multiecho pulse sequences of the brain and surrounding structures were obtained without intravenous contrast. COMPARISON:  CTA head neck 03/07/2019 FINDINGS: Brain:  Moderate atrophy. Moderate ventricular enlargement unchanged. Negative for acute infarct. Chronic microvascular ischemic change in the white matter and pons. Small chronic  infarct left frontal lobe. Negative for hemorrhage or mass. No midline shift. Vascular: Normal arterial flow voids Skull and upper cervical spine: Negative Sinuses/Orbits: Paranasal sinuses clear.  Bilateral cataract surgery Other: None IMPRESSION: Atrophy and chronic ischemia.  No acute intracranial abnormality. Electronically Signed   By: Marlan Palau M.D.   On: 03/08/2019 14:31   Mr Lumbar Spine W Wo Contrast  Result Date: 03/09/2019 CLINICAL DATA:  Generalized muscle weakness.  Multiple recent falls. EXAM: MRI LUMBAR SPINE WITHOUT AND WITH CONTRAST TECHNIQUE: Multiplanar and multiecho pulse sequences of the lumbar spine were obtained without and with intravenous contrast. CONTRAST:  26mL GADAVIST GADOBUTROL 1 MMOL/ML IV SOLN COMPARISON:  CT abdomen and pelvis 01/09/2016 FINDINGS: Segmentation:  Standard. Alignment:  Normal. Vertebrae: No fracture or suspicious marrow lesion. Small L3 superior endplate Schmorl's node. Degenerative endplate changes from L2-S1, predominantly Modic type 2. Conus medullaris and cauda equina: Conus extends to the L1 level. Conus and cauda equina appear normal. Paraspinal and other soft tissues: Unremarkable. Disc levels: Disc desiccation throughout the lumbar spine. Mild disc space narrowing at L2-3 and L3-4 with moderate narrowing at L4-5 and severe narrowing at L5-S1. T12-L1 negative. L1-2: Slight facet hypertrophy without disc herniation or stenosis. L2-3: Mild disc bulging slightly eccentric to the left and mild facet and ligamentum flavum hypertrophy without stenosis. L3-4: Disc bulging eccentric to the right and moderate facet and ligamentum flavum hypertrophy result in mild right lateral recess stenosis and mild-to-moderate right and mild left neural foraminal stenosis without significant spinal  stenosis. L4-5: Disc bulging, endplate spurring, and moderate facet and ligamentum flavum hypertrophy result in moderate right and mild left neural foraminal stenosis without spinal stenosis. L5-S1: Disc bulging, endplate spurring, and mild facet hypertrophy result in mild left greater than right neural foraminal stenosis without spinal stenosis. IMPRESSION: Multilevel lumbar disc and facet degeneration with mild-to-moderate neural foraminal stenosis as above. No significant spinal stenosis. Electronically Signed   By: Sebastian Ache M.D.   On: 03/09/2019 15:03   US Carotid Bilateral  Result Date: 03/08/2019 CLINICAL DATA:  85 year old male with carotid plaque EXAM: BILATERAL CAROTID DUPLEX ULTRASOUND TECHNIQUE: Wallace Cullens scale imaging, color Doppler and duplex ultrasound were performed of bilateral carotid and vertebral arteries in the neck. COMPARISON:  None. FINDINGS: Criteria: Quantification of carotid stenosis is based on velocity parameters that correlate the residual internal carotid diameter with NASCET-based stenosis levels, using the diameter of the distal internal carotid lumen as the denominator for stenosis measurement. The following velocity measurements were obtained: RIGHT ICA: 64/18 cm/sec CCA: 87/15 cm/sec SYSTOLIC ICA/CCA RATIO:  0.7 ECA:  113 cm/sec LEFT ICA: 73/22 cm/sec CCA: 80/15 cm/sec SYSTOLIC ICA/CCA RATIO:  0.9 ECA:  118 cm/sec RIGHT CAROTID ARTERY: Mild heterogeneous atherosclerotic plaque in the proximal internal carotid artery. By peak systolic velocity criteria, the estimated stenosis remains less than 50%. RIGHT VERTEBRAL ARTERY:  Patent with normal antegrade flow. LEFT CAROTID ARTERY: Mild heterogeneous atherosclerotic plaque in the proximal internal carotid artery. By peak systolic velocity criteria, the estimated stenosis remains less than 50%. LEFT VERTEBRAL ARTERY:  Patent with normal antegrade flow. IMPRESSION: 1. Mild (1-49%) stenosis proximal right internal carotid artery  secondary to heterogenous atherosclerotic plaque. 2. Mild (1-49%) stenosis proximal left internal carotid artery secondary to heterogenous atherosclerotic plaque. 3. Vertebral arteries are patent with normal antegrade flow. Signed, Sterling Big, MD, RPVI [facility] Radiology Electronically Signed   By: Malachy Moan M.D.   On: 03/08/2019 10:46     Assessment  and Recommendation  85 y.o. male with known coronary artery disease hypertension hyperlipidemia chronic diastolic dysfunction heart failure having dizziness weakness appearing to improve without evidence of myocardial infarction or current congestive heart failure now with new onset of atrial fibrillation with rapid ventricular rate and symptoms of palpitations 1.  Continue medication management for hypertension control including losartan.  Would likely change amlodipine to diltiazem for heart rate control of atrial fibrillation with rapid ventricular rate following closely for the possibility of bradycardia as well would consider continuation of Lovenox for risk reduction of stroke with atrial fibrillation 2.  No evidence of heart failure or pulmonary edema at this time and therefore no additional diuretics necessary 3.  Continue high intensity cholesterol therapy with atorvastatin 4.  Further adjustments of medication management after above to assess need in further intervention  Signed, Arnoldo HookerBruce Katori Wirsing M.D. FACC

## 2019-03-10 NOTE — Consult Note (Signed)
ANTICOAGULATION CONSULT NOTE - Initial Consult  Pharmacy Consult for Apixaban Indication: atrial fibrillation  Allergies  Allergen Reactions  . Pneumococcal Vaccines Swelling    Patient Measurements: Height: 5\' 9"  (175.3 cm) Weight: 176 lb 4.8 oz (80 kg) IBW/kg (Calculated) : 70.7  Vital Signs: Temp: 98 F (36.7 C) (10/27 1542) Temp Source: Oral (10/27 1542) BP: 118/75 (10/27 1542) Pulse Rate: 66 (10/27 1542)  Labs: Recent Labs    03/08/19 0206 03/08/19 0531  HGB  --  13.1  HCT  --  39.0  PLT  --  180  CREATININE  --  0.65  TROPONINIHS 50* 44*    Estimated Creatinine Clearance: 70 mL/min (by C-G formula based on SCr of 0.65 mg/dL).   Medical History: Past Medical History:  Diagnosis Date  . Bladder neck obstruction   . Bradycardia   . CHF (congestive heart failure) ([place])   . Chronic pancreatitis ([place])   . Coronary artery disease   . Dental bridge present    upper - bilateral  . GERD (gastroesophageal reflux disease)   . Glaucoma   . Hyperlipidemia   . Hypertension   . Myocardial infarction ([place])   . Neuropathy    feet    Medications:  Clopidogrel 75 mg (discontinued 10/27) Enoxaparin 40 mg (discontinued 10/27) Aspirin 81 mg (decreased 10/27)  Assessment: 85 y/o M with history including CAD, MI, bradycardia, bladder neck obstruction admitted 10/24 for near syncope with associated dizziness and generalized weakness. Per chart, patient has had 6 falls in the last several months. Work-up significant for atrial fibrillation with RVR. Pharmacy has been consulted for apixaban initiation for stroke prophylaxis in setting of atrial fibrillation. Clopidogrel discontinued per cardiology. Most recent calculated CrCl 70 mL/min. H&h and platelets within normal limits. Patient does not meet criteria for dose reduction in atrial fibrillation.    Plan:  -Apixaban 5 mg BID -CBC per protocol  Benita Gutter 03/10/2019,4:57 PM

## 2019-03-10 NOTE — [place] FL2 (Signed)
[place] MEDICAID FL2 LEVEL OF CARE SCREENING TOOL     IDENTIFICATION  Patient Name: Scott Cortez Birthdate: 1934/03/29 Sex: male Admission Date (Current Location): 03/07/2019  [place] and [place] Number:  Chiropodist and Address:  [facility], [address], [place], [place] [address]      Provider Number: [id number]  Attending Physician Name and Address:  Ramonita Lab, MD  Relative Name and Phone Number:       Current Level of Care: Hospital Recommended Level of Care: Skilled Nursing Facility Prior Approval Number:    Date Approved/Denied:   PASRR Number: [id number]  Discharge Plan: SNF    Current Diagnoses: Patient Active Problem List   Diagnosis Date Noted  . Near syncope 03/07/2019  . Nocturia 06/26/2016  . Lower abdominal pain 09/04/2015  . Erectile dysfunction 08/30/2014  . 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 Incomplete emptying of bladder 01/26/2014  . Increased frequency of urination 01/26/2014  . Slowing of urinary stream 01/26/2014    Orientation RESPIRATION BLADDER Height & Weight     Self, Situation, Time, Place  Normal Continent Weight: 176 lb 4.8 oz (80 kg) Height:  5\' 9"  (175.3 cm)  BEHAVIORAL SYMPTOMS/MOOD NEUROLOGICAL BOWEL NUTRITION STATUS  (None) (None) Continent Diet(Heart healthy. Patient does not eat meat.)  AMBULATORY STATUS COMMUNICATION OF NEEDS Skin   Supervision Verbally Normal                       Personal Care Assistance Level of Assistance  Bathing, Feeding, Dressing Bathing Assistance: Limited assistance Feeding assistance: Independent Dressing Assistance: Limited assistance     Functional Limitations Info  Sight, Hearing, Speech Sight Info: Adequate Hearing Info: Adequate Speech Info: Adequate    SPECIAL CARE FACTORS FREQUENCY  PT (By licensed PT), OT (By licensed OT)     PT Frequency: 5 x week OT Frequency: 5 x week             Contractures Contractures Info: Not present    Additional Factors Info  Code Status, Allergies Code Status Info: Full code Allergies Info: Pneumococcal Vaccines           Current Medications (03/10/2019):  This is the current hospital active medication list Current Facility-Administered Medications  Medication Dose Route Frequency Provider Last Rate Last Dose  . amLODipine (NORVASC) tablet 5 mg  5 mg Oral Daily 03/12/2019, NP   5 mg at 03/10/19 0956  . aspirin EC tablet 325 mg  325 mg Oral Daily 03/12/19, NP   325 mg at 03/10/19 0955  . atorvastatin (LIPITOR) tablet 20 mg  20 mg Oral Daily 03/12/19, NP   20 mg at 03/10/19 0956  . clopidogrel (PLAVIX) tablet 75 mg  75 mg Oral Daily 03/12/19, NP   75 mg at 03/10/19 0955  . enoxaparin (LOVENOX) injection 40 mg  40 mg Subcutaneous Q24H 03/12/19, NP   40 mg at 03/09/19 2105  . famotidine (PEPCID) tablet 20 mg  20 mg Oral QHS 2106, NP   20 mg at 03/09/19 2055  . losartan (COZAAR) tablet 50 mg  50 mg Oral Daily 2056, NP   50 mg at 03/10/19 0955  . pantoprazole (PROTONIX) EC tablet 40 mg  40 mg Oral Daily 03/12/19, NP   40 mg at 03/10/19 0956  . prednisoLONE-Gatifloxacin 1-0.5 % SUSP 1 drop  1 drop Both  Eyes BID Nicholes Mango, MD   1 drop at 03/10/19 0957  . sodium chloride flush (NS) 0.9 % injection 3 mL  3 mL Intravenous Q12H Lang Snow, NP   3 mL at 03/10/19 0957  . tamsulosin (FLOMAX) capsule 0.4 mg  0.4 mg Oral Daily Lang Snow, NP   0.4 mg at 03/10/19 8329     Discharge Medications: Please see discharge summary for a list of discharge medications.  Relevant Imaging Results:  Relevant Lab Results:   Additional Information SS#: [id number]  Candie Chroman, LCSW

## 2019-03-10 NOTE — Progress Notes (Signed)
Patient ID: Scott Cortez, male   DOB: 05/29/1933, 85 y.o.   MRN: [id number]  [employer] PROGRESS NOTE  Scott Cortez [id number] DOB: 03/16/1934 DOA: 03/07/2019 [facility]  HPI/Subjective: Patient still feels the same in his lower extremities and agreeable to go to rehabilitation center. Patient has intermittent episodes of atrial fibrillation with RVR. He states he has had 6 falls over a period of a few months prior to coming to the hospital.  Objective: Vitals:   03/10/19 1213 03/10/19 1249  BP: (!) 121/93   Pulse: (!) 113 79  Resp:    Temp:    SpO2: 98%     Intake/Output Summary (Last 24 hours) at 03/10/2019 1301 Last data filed at 03/10/2019 0702 Gross per 24 hour  Intake 859.29 ml  Output 1750 ml  Net -890.71 ml   Filed Weights   03/08/19 0456 03/09/19 0150 03/10/19 0427  Weight: 80.9 kg 79.8 kg 80 kg    ROS: Review of Systems  Constitutional: Negative for chills and fever.  Eyes: Negative for blurred vision.  Respiratory: Negative for cough and shortness of breath.   Cardiovascular: Negative for chest pain.  Gastrointestinal: Negative for abdominal pain, constipation, diarrhea, nausea and vomiting.  Genitourinary: Negative for dysuria.  Musculoskeletal: Negative for joint pain.  Neurological: Positive for dizziness. Negative for headaches.   Exam: Physical Exam  Constitutional: He is oriented to person, place, and time.  HENT:  Nose: No mucosal edema.  Mouth/Throat: No oropharyngeal exudate or posterior oropharyngeal edema.  Eyes: Pupils are equal, round, and reactive to light. Conjunctivae, EOM and lids are normal.  Neck: No JVD present. Carotid bruit is not present. No edema present. No thyroid mass and no thyromegaly present.  Cardiovascular: S1 normal and S2 normal. Bradycardia present. Exam reveals no gallop.  No murmur heard. Pulses:      Dorsalis pedis pulses are 2+ on the right side and 2+ on the left side.   Respiratory: No respiratory distress. He has no wheezes. He has no rhonchi. He has no rales.  GI: Soft. Bowel sounds are normal. There is no abdominal tenderness.  Musculoskeletal:     Right ankle: He exhibits no swelling.     Left ankle: He exhibits no swelling.  Lymphadenopathy:    He has no cervical adenopathy.  Neurological: He is alert and oriented to person, place, and time. No cranial nerve deficit.  Left hand finger-to-nose nose slightly impaired.  Unsteady with Romberg testing  Skin: Skin is warm. No rash noted. Nails show no clubbing.  Psychiatric: He has a normal mood and affect.      Data Reviewed: Basic Metabolic Panel: Recent Labs  Lab 03/07/19 1331 03/08/19 0531  NA 138 139  K 4.0 3.8  CL 102 104  CO2 26 26  GLUCOSE 149* 110*  BUN 13 11  CREATININE 1.09 0.65  CALCIUM 9.3 9.1   Liver Function Tests: Recent Labs  Lab 03/07/19 1331  AST 25  ALT 13  ALKPHOS 82  BILITOT 1.7*  PROT 6.7  ALBUMIN 3.9   No results for input(s): LIPASE, AMYLASE in the last 168 hours. No results for input(s): AMMONIA in the last 168 hours. CBC: Recent Labs  Lab 03/07/19 1331 03/08/19 0531  WBC 4.7 4.7  NEUTROABS 3.2  --   HGB 13.7 13.1  HCT 40.5 39.0  MCV 91.0 91.1  PLT 174 180     Recent Results (from the past 240 hour(s))  SARS CORONAVIRUS 2 (  TAT 6-24 HRS) Nasopharyngeal Nasopharyngeal Swab     Status: None   Collection Time: 03/07/19  6:59 PM   Specimen: Nasopharyngeal Swab  Result Value Ref Range Status   SARS Coronavirus 2 NEGATIVE NEGATIVE Final    Comment: (NOTE) SARS-CoV-2 target nucleic acids are NOT DETECTED. The SARS-CoV-2 RNA is generally detectable in upper and lower respiratory specimens during the acute phase of infection. Negative results do not preclude SARS-CoV-2 infection, do not rule out co-infections with other pathogens, and should not be used as the sole basis for treatment or other patient management decisions. Negative results must  be combined with clinical observations, patient history, and epidemiological information. The expected result is Negative. Fact Sheet for Patients: SugarRoll.be Fact Sheet for Healthcare Providers: https://www.woods-mathews.com/ This test is not yet approved or cleared by the [place] FDA and  has been authorized for detection and/or diagnosis of SARS-CoV-2 by FDA under an Emergency Use Authorization (EUA). This EUA will remain  in effect (meaning this test can be used) for the duration of the COVID-19 declaration under Section 56 4(b)(1) of the Act, 21 U.S.C. section 360bbb-3(b)(1), unless the authorization is terminated or revoked sooner. Performed at [facility], [place] [address], [place], [place] [address]      Studies: Mr Brain Wo Contrast  Result Date: 03/08/2019 CLINICAL DATA:  Ataxia.  Headache. EXAM: MRI HEAD WITHOUT CONTRAST TECHNIQUE: Multiplanar, multiecho pulse sequences of the brain and surrounding structures were obtained without intravenous contrast. COMPARISON:  CTA head neck 03/07/2019 FINDINGS: Brain: Moderate atrophy. Moderate ventricular enlargement unchanged. Negative for acute infarct. Chronic microvascular ischemic change in the white matter and pons. Small chronic infarct left frontal lobe. Negative for hemorrhage or mass. No midline shift. Vascular: Normal arterial flow voids Skull and upper cervical spine: Negative Sinuses/Orbits: Paranasal sinuses clear.  Bilateral cataract surgery Other: None IMPRESSION: Atrophy and chronic ischemia.  No acute intracranial abnormality. Electronically Signed   By: Franchot Gallo M.D.   On: 03/08/2019 14:31   Mr Lumbar Spine W Wo Contrast  Result Date: 03/09/2019 CLINICAL DATA:  Generalized muscle weakness.  Multiple recent falls. EXAM: MRI LUMBAR SPINE WITHOUT AND WITH CONTRAST TECHNIQUE: Multiplanar and multiecho pulse sequences of the lumbar spine were obtained without and  with intravenous contrast. CONTRAST:  68mL GADAVIST GADOBUTROL 1 MMOL/ML IV SOLN COMPARISON:  CT abdomen and pelvis 01/09/2016 FINDINGS: Segmentation:  Standard. Alignment:  Normal. Vertebrae: No fracture or suspicious marrow lesion. Small L3 superior endplate Schmorl's node. Degenerative endplate changes from Y3-K1, predominantly Modic type 2. Conus medullaris and cauda equina: Conus extends to the L1 level. Conus and cauda equina appear normal. Paraspinal and other soft tissues: Unremarkable. Disc levels: Disc desiccation throughout the lumbar spine. Mild disc space narrowing at L2-3 and L3-4 with moderate narrowing at L4-5 and severe narrowing at L5-S1. T12-L1 negative. L1-2: Slight facet hypertrophy without disc herniation or stenosis. L2-3: Mild disc bulging slightly eccentric to the left and mild facet and ligamentum flavum hypertrophy without stenosis. L3-4: Disc bulging eccentric to the right and moderate facet and ligamentum flavum hypertrophy result in mild right lateral recess stenosis and mild-to-moderate right and mild left neural foraminal stenosis without significant spinal stenosis. L4-5: Disc bulging, endplate spurring, and moderate facet and ligamentum flavum hypertrophy result in moderate right and mild left neural foraminal stenosis without spinal stenosis. L5-S1: Disc bulging, endplate spurring, and mild facet hypertrophy result in mild left greater than right neural foraminal stenosis without spinal stenosis. IMPRESSION: Multilevel lumbar disc and facet  degeneration with mild-to-moderate neural foraminal stenosis as above. No significant spinal stenosis. Electronically Signed   By: Sebastian Ache M.D.   On: 03/09/2019 15:03    Scheduled Meds: . aspirin EC  325 mg Oral Daily  . atorvastatin  20 mg Oral Daily  . clopidogrel  75 mg Oral Daily  . diltiazem  120 mg Oral Daily  . enoxaparin (LOVENOX) injection  40 mg Subcutaneous Q24H  . famotidine  20 mg Oral QHS  . [START ON 03/11/2019]  losartan  25 mg Oral Daily  . metoprolol tartrate  25 mg Oral BID  . pantoprazole  40 mg Oral Daily  . polyethylene glycol  17 g Oral Daily  . prednisoLONE-Gatifloxacin  1 drop Both Eyes BID  . sodium chloride flush  3 mL Intravenous Q12H  . tamsulosin  0.4 mg Oral Daily   Continuous Infusions:   Assessment/Plan:  1. Paroxysmal atrial fibrillation with intermittent episodes of bradycardia Discontinued patient's amlodipine and patient is started on diltiazem for heart rate control with close monitoring for the possibility of bradycardia.  Recommending Lovenox for  risk reduction of stroke with atrial fibrillation Dr. Crissie Sickles is following appreciate his recommendations 2. Near syncope, unsteady with Romberg, impaired left finger nose movements.  MRI of the brain with no acute findings  Patient already on aspirin Plavix and atorvastatin.  PT is recommending home health PT with 24-hour supervision  3. Bilateral lower extremity weakness and decreased mobility-probably from peripheral neuropathy MRI of the lumbar spine-has revealed degenerative disorder with foraminal stenosis but no spinal stenosis.  Neurology consult placed.  B12, B1, folate, heavy metal screening, serum protein electrophoresis are ordered outpatient follow-up with neurology for nerve conduction velocities as scheduled 4. Hypertension continue usual medications 5. Hyperlipidemia on atorvastatin 6. History of diastolic congestive heart failure no signs of heart failure currently.  Careful with fluids. 7. Disposition: Rehabilitation as patient does not have 24/7 supervision at home.  Patient is agreeable    Code Status:     Code Status Orders  (From admission, onward)         Start     Ordered   03/07/19 2030  Full code  Continuous     03/07/19 2033        Code Status History    This patient has a current code status but no historical code status.   Advance Care Planning Activity     Family Communication: I  spoke to patient's healthcare power of attorney, his niece Barbara-[phone]   Consultants:  Cardiology  Neurology  Time spent: 35 minutes  Deanna Artis Avaree Gilberti  [employer]

## 2019-03-10 NOTE — Progress Notes (Signed)
Patient ID: Scott Cortez, male   DOB: 19-Oct-1933, 85 y.o.   MRN: [id number]  [employer] PROGRESS NOTE  REESE STOCKMAN WIO:[id number] DOB: Jul 27, 1933 DOA: 03/07/2019 [facility]: [facility]  HPI/Subjective: Patient states his ankles and lower legs and not moving , he supposed to see a neurologist today but canceled and rescheduled appointment for December as he still in the hospital.  There is nobody to take care of him at home as he lives alone . He states he has had 6 falls over a period of a few months.  Objective: Vitals:   03/10/19 1213 03/10/19 1249  BP: (!) 121/93   Pulse: (!) 113 79  Resp:    Temp:    SpO2: 98%     Intake/Output Summary (Last 24 hours) at 03/10/2019 1256 Last data filed at 03/10/2019 0702 Gross per 24 hour  Intake 859.29 ml  Output 1750 ml  Net -890.71 ml   Filed Weights   03/08/19 0456 03/09/19 0150 03/10/19 0427  Weight: 80.9 kg 79.8 kg 80 kg    ROS: Review of Systems  Constitutional: Negative for chills and fever.  Eyes: Negative for blurred vision.  Respiratory: Negative for cough and shortness of breath.   Cardiovascular: Negative for chest pain.  Gastrointestinal: Negative for abdominal pain, constipation, diarrhea, nausea and vomiting.  Genitourinary: Negative for dysuria.  Musculoskeletal: Negative for joint pain.  Neurological: Positive for dizziness. Negative for headaches.   Exam: Physical Exam  Constitutional: He is oriented to person, place, and time.  HENT:  Nose: No mucosal edema.  Mouth/Throat: No oropharyngeal exudate or posterior oropharyngeal edema.  Eyes: Pupils are equal, round, and reactive to light. Conjunctivae, EOM and lids are normal.  Neck: No JVD present. Carotid bruit is not present. No edema present. No thyroid mass and no thyromegaly present.  Cardiovascular: S1 normal and S2 normal. Bradycardia present. Exam reveals no gallop.  No murmur heard. Pulses:      Dorsalis pedis pulses are 2+ on  the right side and 2+ on the left side.  Respiratory: No respiratory distress. He has no wheezes. He has no rhonchi. He has no rales.  GI: Soft. Bowel sounds are normal. There is no abdominal tenderness.  Musculoskeletal:     Right ankle: He exhibits no swelling.     Left ankle: He exhibits no swelling.  Lymphadenopathy:    He has no cervical adenopathy.  Neurological: He is alert and oriented to person, place, and time. No cranial nerve deficit.  Left hand finger-to-nose nose slightly impaired.  Unsteady with Romberg testing  Skin: Skin is warm. No rash noted. Nails show no clubbing.  Psychiatric: He has a normal mood and affect.      Data Reviewed: Basic Metabolic Panel: Recent Labs  Lab 03/07/19 1331 03/08/19 0531  NA 138 139  K 4.0 3.8  CL 102 104  CO2 26 26  GLUCOSE 149* 110*  BUN 13 11  CREATININE 1.09 0.65  CALCIUM 9.3 9.1   Liver Function Tests: Recent Labs  Lab 03/07/19 1331  AST 25  ALT 13  ALKPHOS 82  BILITOT 1.7*  PROT 6.7  ALBUMIN 3.9   No results for input(s): LIPASE, AMYLASE in the last 168 hours. No results for input(s): AMMONIA in the last 168 hours. CBC: Recent Labs  Lab 03/07/19 1331 03/08/19 0531  WBC 4.7 4.7  NEUTROABS 3.2  --   HGB 13.7 13.1  HCT 40.5 39.0  MCV 91.0 91.1  PLT 174  180     Recent Results (from the past 240 hour(s))  SARS CORONAVIRUS 2 (TAT 6-24 HRS) Nasopharyngeal Nasopharyngeal Swab     Status: None   Collection Time: 03/07/19  6:59 PM   Specimen: Nasopharyngeal Swab  Result Value Ref Range Status   SARS Coronavirus 2 NEGATIVE NEGATIVE Final    Comment: (NOTE) SARS-CoV-2 target nucleic acids are NOT DETECTED. The SARS-CoV-2 RNA is generally detectable in upper and lower respiratory specimens during the acute phase of infection. Negative results do not preclude SARS-CoV-2 infection, do not rule out co-infections with other pathogens, and should not be used as the sole basis for treatment or other patient  management decisions. Negative results must be combined with clinical observations, patient history, and epidemiological information. The expected result is Negative. Fact Sheet for Patients: HairSlick.nohttps://www.fda.gov/media/138098/download Fact Sheet for Healthcare Providers: quierodirigir.comhttps://www.fda.gov/media/138095/download This test is not yet approved or cleared by the [place] FDA and  has been authorized for detection and/or diagnosis of SARS-CoV-2 by FDA under an Emergency Use Authorization (EUA). This EUA will remain  in effect (meaning this test can be used) for the duration of the COVID-19 declaration under Section 56 4(b)(1) of the Act, 21 U.S.C. section 360bbb-3(b)(1), unless the authorization is terminated or revoked sooner. Performed at [facility], [address] [place], [place] [address]      Studies: Mr Brain Wo Contrast  Result Date: 03/08/2019 CLINICAL DATA:  Ataxia.  Headache. EXAM: MRI HEAD WITHOUT CONTRAST TECHNIQUE: Multiplanar, multiecho pulse sequences of the brain and surrounding structures were obtained without intravenous contrast. COMPARISON:  CTA head neck 03/07/2019 FINDINGS: Brain: Moderate atrophy. Moderate ventricular enlargement unchanged. Negative for acute infarct. Chronic microvascular ischemic change in the white matter and pons. Small chronic infarct left frontal lobe. Negative for hemorrhage or mass. No midline shift. Vascular: Normal arterial flow voids Skull and upper cervical spine: Negative Sinuses/Orbits: Paranasal sinuses clear.  Bilateral cataract surgery Other: None IMPRESSION: Atrophy and chronic ischemia.  No acute intracranial abnormality. Electronically Signed   By: Marlan Palauharles  Clark M.D.   On: 03/08/2019 14:31   Mr Lumbar Spine W Wo Contrast  Result Date: 03/09/2019 CLINICAL DATA:  Generalized muscle weakness.  Multiple recent falls. EXAM: MRI LUMBAR SPINE WITHOUT AND WITH CONTRAST TECHNIQUE: Multiplanar and multiecho pulse sequences of  the lumbar spine were obtained without and with intravenous contrast. CONTRAST:  7mL GADAVIST GADOBUTROL 1 MMOL/ML IV SOLN COMPARISON:  CT abdomen and pelvis 01/09/2016 FINDINGS: Segmentation:  Standard. Alignment:  Normal. Vertebrae: No fracture or suspicious marrow lesion. Small L3 superior endplate Schmorl's node. Degenerative endplate changes from L2-S1, predominantly Modic type 2. Conus medullaris and cauda equina: Conus extends to the L1 level. Conus and cauda equina appear normal. Paraspinal and other soft tissues: Unremarkable. Disc levels: Disc desiccation throughout the lumbar spine. Mild disc space narrowing at L2-3 and L3-4 with moderate narrowing at L4-5 and severe narrowing at L5-S1. T12-L1 negative. L1-2: Slight facet hypertrophy without disc herniation or stenosis. L2-3: Mild disc bulging slightly eccentric to the left and mild facet and ligamentum flavum hypertrophy without stenosis. L3-4: Disc bulging eccentric to the right and moderate facet and ligamentum flavum hypertrophy result in mild right lateral recess stenosis and mild-to-moderate right and mild left neural foraminal stenosis without significant spinal stenosis. L4-5: Disc bulging, endplate spurring, and moderate facet and ligamentum flavum hypertrophy result in moderate right and mild left neural foraminal stenosis without spinal stenosis. L5-S1: Disc bulging, endplate spurring, and mild facet hypertrophy result in mild  left greater than right neural foraminal stenosis without spinal stenosis. IMPRESSION: Multilevel lumbar disc and facet degeneration with mild-to-moderate neural foraminal stenosis as above. No significant spinal stenosis. Electronically Signed   By: Logan Bores M.D.   On: 03/09/2019 15:03    Scheduled Meds: . aspirin EC  325 mg Oral Daily  . atorvastatin  20 mg Oral Daily  . clopidogrel  75 mg Oral Daily  . diltiazem  120 mg Oral Daily  . enoxaparin (LOVENOX) injection  40 mg Subcutaneous Q24H  . famotidine  20  mg Oral QHS  . [START ON 03/11/2019] losartan  25 mg Oral Daily  . metoprolol tartrate  25 mg Oral BID  . pantoprazole  40 mg Oral Daily  . polyethylene glycol  17 g Oral Daily  . prednisoLONE-Gatifloxacin  1 drop Both Eyes BID  . sodium chloride flush  3 mL Intravenous Q12H  . tamsulosin  0.4 mg Oral Daily   Continuous Infusions:   Assessment/Plan:  1. Near syncope, unsteady with Romberg, impaired left finger nose movements.  MRI of the brain with no acute findings  Patient already on aspirin Plavix and atorvastatin.  PT is recommending home health PT with 24-hour supervision  2. Bilateral lower extremity weakness and decreased mobility-MRI of the lumbar spine.  Neurology consult placed.  Outpatient follow-up with neurology for nerve conduction velocities as scheduled 3. Hypertension continue usual medications 4. Hyperlipidemia on atorvastatin 5. History of diastolic congestive heart failure no signs of heart failure currently.  Careful with fluids. 6. Bradycardia.  Not on any rate controlling medications.  Continue to monitor on telemetry.  Disposition physical therapy is recommending home health PT 24-hour supervision but patient does not have anybody to supervise him 24/7 and he lives alone  Code Status:     Code Status Orders  (From admission, onward)         Start     Ordered   03/07/19 2030  Full code  Continuous     03/07/19 2033        Code Status History    This patient has a current code status but no historical code status.   Advance Care Planning Activity     Family Communication: The number for his sister is disconnected.  The number for his niece was busy. Disposition Plan: Hopefully home tomorrow  Consultants:  Cardiology  Time spent: 35 minutes  Illene Silver Rayen Palen  [employer]

## 2019-03-10 NOTE — TOC Progression Note (Signed)
Transition of Care [facility]) - Progression Note    Patient Details  Name: Scott Cortez MRN: [id number] Date of Birth: [birth year]  Transition of Care [facility]) CM/SW [place], LCSW Phone Number: 03/10/2019, 10:16 AM  Clinical Narrative: PT and OT are recommending 24/7 supervision due to history of frequent falls but patient does not have this. His nephew lives next door. MD talked to his niece yesterday and she is unable to provide 24/7 supervision. CSW discussed with patient. Unsure if Medicare would cover SNF placement due to PT/OT only recommending home health. Patient walked 200 feet with supervision yesterday and min assist-supervision level with ADL's. Patient believes his neuropathy will eventually lead him to not being able to care for himself and he is contemplating the eventual need for ALF placement. Patient understands that if we are unable to place him in a facility, plan will still be for home health through [place]. We can try to maximize home health services including a social worker to work on eventual need for Medicaid/long-term placement.    Expected Discharge Plan: [place]    Expected Discharge Plan and Services Expected Discharge Plan: [place] Choice: Home Health, Durable Medical Equipment Living arrangements for the past 2 months: [place]: PT [place]: [place] Date [place]: 03/09/19   Representative spoke with at [place]: Floydene Flock   Social Determinants of Health (SDOH) Interventions    Readmission Risk Interventions No flowsheet data found.

## 2019-03-10 NOTE — Care Management Important Message (Signed)
Important Message  Patient Details  Name: VIRGAL WARMUTH MRN: [id number] Date of Birth: [birth year]   Medicare Important Message Given:  Other (see comment)  Attempted to review Medicare IM with patient, however he declined stating he didn't feel well and "didn't need it".  Paper copy not left upon patient's request.     Dannette Barbara 03/10/2019, 12:59 PM

## 2019-03-11 LAB — BASIC METABOLIC PANEL
Anion gap: 9 (ref 5–15)
BUN: 13 mg/dL (ref 8–23)
CO2: 27 mmol/L (ref 22–32)
Calcium: 9.1 mg/dL (ref 8.9–10.3)
Chloride: 103 mmol/L (ref 98–111)
Creatinine, Ser: 0.81 mg/dL (ref 0.61–1.24)
GFR calc Af Amer: >60 mL/min (ref >60)
GFR calc non Af Amer: >60 mL/min (ref >60)
Glucose, Bld: 104 mg/dL — ABNORMAL HIGH (ref 70–99)
Potassium: 3.9 mmol/L (ref 3.5–5.1)
Sodium: 139 mmol/L (ref 135–145)

## 2019-03-11 LAB — HEAVY METALS, BLOOD
Arsenic: 5 ug/L (ref 2–23)
Lead: 2 ug/dL (ref 0–4)
Mercury: 1.4 ug/L (ref 0.0–14.9)

## 2019-03-11 LAB — PROTEIN ELECTROPHORESIS, SERUM
A/G Ratio: 1.4 (ref 0.7–1.7)
Albumin ELP: 3.5 g/dL (ref 2.9–4.4)
Alpha-1-Globulin: 0.2 g/dL (ref 0.0–0.4)
Alpha-2-Globulin: 0.8 g/dL (ref 0.4–1.0)
Beta Globulin: 0.7 g/dL (ref 0.7–1.3)
Gamma Globulin: 0.8 g/dL (ref 0.4–1.8)
Globulin, Total: 2.5 g/dL (ref 2.2–3.9)
Total Protein ELP: 6 g/dL (ref 6.0–8.5)

## 2019-03-11 LAB — CBC
HCT: 37.4 % — ABNORMAL LOW (ref 39.0–52.0)
Hemoglobin: 12.8 g/dL — ABNORMAL LOW (ref 13.0–17.0)
MCH: 30.7 pg (ref 26.0–34.0)
MCHC: 34.2 g/dL (ref 30.0–36.0)
MCV: 89.7 fL (ref 80.0–100.0)
Platelets: 181 10*3/uL (ref 150–400)
RBC: 4.17 MIL/uL — ABNORMAL LOW (ref 4.22–5.81)
RDW: 13.1 % (ref 11.5–15.5)
WBC: 4.9 10*3/uL (ref 4.0–10.5)
nRBC: 0 % (ref 0.0–0.2)

## 2019-03-11 NOTE — Progress Notes (Signed)
[facility] Cardiology [facility] Encounter Note  Patient: Scott Cortez / Admit Date: 03/07/2019 / Date of Encounter: 03/11/2019, 8:50 AM   Subjective: Patient is ambulating well without evidence of significant symptoms or issues of congestive heart failure or angina.    Patient has had full neurologic work-up with no evidence of changes by brain MRI or neurologic consultation other than possible peripheral neuropathy which appears to be his main complaint at this time New telemetry changes with acute onset of atrial fibrillation with rapid ventricular rate with palpitations but no evidence chest discomfort or shortness of breath or congestive heart failure that is now spontaneously converted to normal rhythm after additional dosages of diltiazem.  Telemetry at this time shows sinus bradycardia .  Troponin is flat at peak of 54.  Hypertension reasonably controlled.   Echocardiogram showing normal LV systolic function ejection fraction of 55 to 60% with no evidence of significant valvular heart disease.  Review of Systems: Positive for: None Negative for: Vision change, hearing change, syncope, dizziness, nausea, vomiting,diarrhea, bloody stool, stomach pain, cough, congestion, diaphoresis, urinary frequency, urinary pain,skin lesions, skin rashes Others previously listed  Objective: Telemetry: Sinus bradycardia Physical Exam: Blood pressure 104/68, pulse (!) 53, temperature 98.1 F (36.7 C), resp. rate 18, height  (1.753 m), weight 81.6 kg, SpO2 97 %. Body mass index is 26.57 kg/m. General: Well developed, well nourished, in no acute distress. Head: Normocephalic, atraumatic, sclera non-icteric, no xanthomas, nares are without discharge. Neck: No apparent masses Lungs: Normal respirations with no wheezes, no rhonchi, no rales , no crackles   Heart: Regular rate and rhythm, normal S1 S2, no murmur, no rub, no gallop, PMI is normal size and placement, carotid upstroke normal  without bruit, jugular venous pressure normal Abdomen: Soft, non-tender, non-distended with normoactive bowel sounds. No hepatosplenomegaly. Abdominal aorta is normal size without bruit Extremities: No edema, no clubbing, no cyanosis, no ulcers,  Peripheral: 2+ radial, 2+ femoral, 2+ dorsal pedal pulses Neuro: Alert and oriented. Moves all extremities spontaneously. Psych:  Responds to questions appropriately with a normal affect.   Intake/Output Summary (Last 24 hours) at 03/11/2019 0850 Last data filed at 03/11/2019 0113 Gross per 24 hour  Intake -  Output 400 ml  Net -400 ml    Inpatient Medications:  . aspirin EC  81 mg Oral Daily  . atorvastatin  20 mg Oral Daily  . diltiazem  120 mg Oral Daily  . famotidine  20 mg Oral QHS  . pantoprazole  40 mg Oral Daily  . polyethylene glycol  17 g Oral Daily  . prednisoLONE-Gatifloxacin  1 drop Both Eyes BID  . sodium chloride flush  3 mL Intravenous Q12H  . tamsulosin  0.4 mg Oral Daily   Infusions:    Labs: Recent Labs    03/11/19 0439  NA 139  K 3.9  CL 103  CO2 27  GLUCOSE 104*  BUN 13  CREATININE 0.81  CALCIUM 9.1   No results for input(s): AST, ALT, ALKPHOS, BILITOT, PROT, ALBUMIN in the last 72 hours. Recent Labs    03/11/19 0439  WBC 4.9  HGB 12.8*  HCT 37.4*  MCV 89.7  PLT 181   No results for input(s): CKTOTAL, CKMB, TROPONINI in the last 72 hours. Invalid input(s): POCBNP No results for input(s): HGBA1C in the last 72 hours.   Weights: Filed Weights   03/10/19 0427 03/11/19 0500 03/11/19 0520  Weight: 80 kg 80 kg 81.6 kg     Radiology/Studies:  Ct Angio Head W Or Wo Contrast  Result Date: 03/07/2019 CLINICAL DATA:  Syncope/fainting, cardiac etiology suspected. Sudden onset of weakness today. EXAM: CT ANGIOGRAPHY HEAD AND NECK TECHNIQUE: Multidetector CT imaging of the head and neck was performed using the standard protocol during bolus administration of intravenous contrast. Multiplanar CT image  reconstructions and MIPs were obtained to evaluate the vascular anatomy. Carotid stenosis measurements (when applicable) are obtained utilizing NASCET criteria, using the distal internal carotid diameter as the denominator. CONTRAST:  53mL OMNIPAQUE IOHEXOL 350 MG/ML SOLN COMPARISON:  Head CT 01/09/2016 FINDINGS: CT HEAD FINDINGS Brain: New from prior head CT 01/09/2016, there is a chronic appearing cortical/subcortical infarct within the anterolateral left frontal lobe. No other demarcated cortical infarction is identified. No evidence of intracranial mass. No midline shift or extra-axial fluid collection. Ill-defined hypoattenuation of the cerebral white matter consistent with chronic small vessel ischemic disease. Moderate generalized parenchymal atrophy. Vascular: Reported separately Skull: Normal. Negative for fracture or focal lesion. Sinuses: Mild ethmoid sinus mucosal thickening. No significant mastoid effusion. Orbits: Visualized orbits demonstrate no acute abnormality. Review of the MIP images confirms the above findings CTA NECK FINDINGS Aortic arch: The left vertebral artery arises directly from the aortic arch. Scattered soft and calcified plaque within the visualized aortic arch and proximal major branch vessels of the neck. The visualized aortic arch is ectatic measuring up to 3.5 cm in diameter. Right carotid system: Common carotid artery widely patent. Soft and calcified plaque at the carotid bifurcation and within the proximal right ICA with less than 50% narrowing of the proximal ICA. Distal to this, the ICA is widely patent within the neck without stenosis. Left carotid system: Common carotid artery widely patent with scattered calcified plaque. Predominantly calcified plaque at the carotid bifurcation and within the proximal left ICA. It is difficult to accurately quantify the degree of stenosis at the origin of the ICA due to prominent irregular calcified plaque. There may be greater than 50%  stenosis at this site. Distal to this, the ICA is widely patent within the neck with mild scattered calcified plaque. Vertebral arteries: Right vertebral artery slightly dominant. Mixed plaque at the origin of the right vertebral artery with suspected mild/moderate ostial stenosis. Distal to this, the right vertebral artery is widely patent within the neck with only mild scattered calcified plaque. Calcified plaque at the origin of the left vertebral artery with suspected at least moderate ostial stenosis. Distal to this, the left vertebral artery is widely patent within the neck. Skeleton: No acute bony abnormality. Cervical spondylosis with multilevel posterior disc osteophytes, uncovertebral and facet hypertrophy. No high-grade bony spinal canal stenosis. Other neck: No soft tissue neck mass or pathologically enlarged cervical chain lymph nodes. Upper chest: No consolidation within the imaged lung apices Review of the MIP images confirms the above findings CTA HEAD FINDINGS Anterior circulation: Calcified plaque within the intracranial internal carotid arteries bilaterally. Sites of up to moderate stenosis within the cavernous right ICA. No more than mild stenosis within the left ICA. The right middle and anterior cerebral arteries are patent without significant proximal stenosis. The left middle and anterior cerebral arteries are patent without significant proximal stenosis. No intracranial aneurysm is identified. Posterior circulation: The intracranial vertebral arteries are patent without significant stenosis. Mild calcified plaque within the intracranial right vertebral artery. The basilar artery is patent without significant stenosis. Minimal calcified plaque within the proximal basilar artery. The bilateral posterior cerebral arteries are patent without significant proximal stenosis. Venous sinuses: Within limitations of  contrast timing, no convincing thrombus. Anatomic variants: Posterior communicating  arteries are poorly delineated bilaterally and may be hypoplastic or absent. Review of the MIP images confirms the above findings IMPRESSION: CT head: 1. 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 MRI may be obtained for confirmation, if clinically warranted. 2. Generalized parenchymal atrophy with chronic small vessel ischemic disease. CTA head: 1. No intracranial large vessel occlusion. 2. Intracranial atherosclerotic disease as described. Most notably, there is calcified plaque within the carotid artery siphons bilaterally with sites of up to moderate stenosis within the cavernous right ICA. No more than mild narrowing within the left ICA. CTA neck: 1. Right common and internal carotid arteries patent within the neck without significant stenosis. Mixed plaque results in less than 50% narrowing of the proximal right ICA. 2. Left common and internal carotid arteries patent within the neck. It is difficult to accurately quantify the degree of stenosis at the origin of the left internal carotid artery due to the presence of prominent irregular calcified plaque. There may be greater than 50% stenosis at this site. Consider carotid artery duplex for further evaluation. 3. Bilateral vertebral arteries patent within the neck. Plaque at the vertebral artery origins with suspected mild/moderate right ostial stenosis and at least moderate left ostial stenosis. Electronically Signed   By: Jackey LogeKyle  Golden DO   On: 03/07/2019 22:01   Ct Angio Neck W Or Wo Contrast  Result Date: 03/07/2019 CLINICAL DATA:  Syncope/fainting, cardiac etiology suspected. Sudden onset of weakness today. EXAM: CT ANGIOGRAPHY HEAD AND NECK TECHNIQUE: Multidetector CT imaging of the head and neck was performed using the standard protocol during bolus administration of intravenous contrast. Multiplanar CT image reconstructions and MIPs were obtained to evaluate the  vascular anatomy. Carotid stenosis measurements (when applicable) are obtained utilizing NASCET criteria, using the distal internal carotid diameter as the denominator. CONTRAST:  75mL OMNIPAQUE IOHEXOL 350 MG/ML SOLN COMPARISON:  Head CT 01/09/2016 FINDINGS: CT HEAD FINDINGS Brain: New from prior head CT 01/09/2016, there is a chronic appearing cortical/subcortical infarct within the anterolateral left frontal lobe. No other demarcated cortical infarction is identified. No evidence of intracranial mass. No midline shift or extra-axial fluid collection. Ill-defined hypoattenuation of the cerebral white matter consistent with chronic small vessel ischemic disease. Moderate generalized parenchymal atrophy. Vascular: Reported separately Skull: Normal. Negative for fracture or focal lesion. Sinuses: Mild ethmoid sinus mucosal thickening. No significant mastoid effusion. Orbits: Visualized orbits demonstrate no acute abnormality. Review of the MIP images confirms the above findings CTA NECK FINDINGS Aortic arch: The left vertebral artery arises directly from the aortic arch. Scattered soft and calcified plaque within the visualized aortic arch and proximal major branch vessels of the neck. The visualized aortic arch is ectatic measuring up to 3.5 cm in diameter. Right carotid system: Common carotid artery widely patent. Soft and calcified plaque at the carotid bifurcation and within the proximal right ICA with less than 50% narrowing of the proximal ICA. Distal to this, the ICA is widely patent within the neck without stenosis. Left carotid system: Common carotid artery widely patent with scattered calcified plaque. Predominantly calcified plaque at the carotid bifurcation and within the proximal left ICA. It is difficult to accurately quantify the degree of stenosis at the origin of the ICA due to prominent irregular calcified plaque. There may be greater than 50% stenosis at this site. Distal to this, the ICA is widely  patent within the neck with mild scattered  calcified plaque. Vertebral arteries: Right vertebral artery slightly dominant. Mixed plaque at the origin of the right vertebral artery with suspected mild/moderate ostial stenosis. Distal to this, the right vertebral artery is widely patent within the neck with only mild scattered calcified plaque. Calcified plaque at the origin of the left vertebral artery with suspected at least moderate ostial stenosis. Distal to this, the left vertebral artery is widely patent within the neck. Skeleton: No acute bony abnormality. Cervical spondylosis with multilevel posterior disc osteophytes, uncovertebral and facet hypertrophy. No high-grade bony spinal canal stenosis. Other neck: No soft tissue neck mass or pathologically enlarged cervical chain lymph nodes. Upper chest: No consolidation within the imaged lung apices Review of the MIP images confirms the above findings CTA HEAD FINDINGS Anterior circulation: Calcified plaque within the intracranial internal carotid arteries bilaterally. Sites of up to moderate stenosis within the cavernous right ICA. No more than mild stenosis within the left ICA. The right middle and anterior cerebral arteries are patent without significant proximal stenosis. The left middle and anterior cerebral arteries are patent without significant proximal stenosis. No intracranial aneurysm is identified. Posterior circulation: The intracranial vertebral arteries are patent without significant stenosis. Mild calcified plaque within the intracranial right vertebral artery. The basilar artery is patent without significant stenosis. Minimal calcified plaque within the proximal basilar artery. The bilateral posterior cerebral arteries are patent without significant proximal stenosis. Venous sinuses: Within limitations of contrast timing, no convincing thrombus. Anatomic variants: Posterior communicating arteries are poorly delineated bilaterally and may be  hypoplastic or absent. Review of the MIP images confirms the above findings IMPRESSION: CT head: 1. 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 MRI may be obtained for confirmation, if clinically warranted. 2. Generalized parenchymal atrophy with chronic small vessel ischemic disease. CTA head: 1. No intracranial large vessel occlusion. 2. Intracranial atherosclerotic disease as described. Most notably, there is calcified plaque within the carotid artery siphons bilaterally with sites of up to moderate stenosis within the cavernous right ICA. No more than mild narrowing within the left ICA. CTA neck: 1. Right common and internal carotid arteries patent within the neck without significant stenosis. Mixed plaque results in less than 50% narrowing of the proximal right ICA. 2. Left common and internal carotid arteries patent within the neck. It is difficult to accurately quantify the degree of stenosis at the origin of the left internal carotid artery due to the presence of prominent irregular calcified plaque. There may be greater than 50% stenosis at this site. Consider carotid artery duplex for further evaluation. 3. Bilateral vertebral arteries patent within the neck. Plaque at the vertebral artery origins with suspected mild/moderate right ostial stenosis and at least moderate left ostial stenosis. Electronically Signed   By: Jackey Loge DO   On: 03/07/2019 22:01   Mr Brain Wo Contrast  Result Date: 03/08/2019 CLINICAL DATA:  Ataxia.  Headache. EXAM: MRI HEAD WITHOUT CONTRAST TECHNIQUE: Multiplanar, multiecho pulse sequences of the brain and surrounding structures were obtained without intravenous contrast. COMPARISON:  CTA head neck 03/07/2019 FINDINGS: Brain: Moderate atrophy. Moderate ventricular enlargement unchanged. Negative for acute infarct. Chronic microvascular ischemic change in the white matter and pons. Small  chronic infarct left frontal lobe. Negative for hemorrhage or mass. No midline shift. Vascular: Normal arterial flow voids Skull and upper cervical spine: Negative Sinuses/Orbits: Paranasal sinuses clear.  Bilateral cataract surgery Other: None IMPRESSION: Atrophy and chronic ischemia.  No acute intracranial abnormality. Electronically Signed  By: Franchot Gallo M.D.   On: 03/08/2019 14:31   Mr Lumbar Spine W Wo Contrast  Result Date: 03/09/2019 CLINICAL DATA:  Generalized muscle weakness.  Multiple recent falls. EXAM: MRI LUMBAR SPINE WITHOUT AND WITH CONTRAST TECHNIQUE: Multiplanar and multiecho pulse sequences of the lumbar spine were obtained without and with intravenous contrast. CONTRAST:  39mL GADAVIST GADOBUTROL 1 MMOL/ML IV SOLN COMPARISON:  CT abdomen and pelvis 01/09/2016 FINDINGS: Segmentation:  Standard. Alignment:  Normal. Vertebrae: No fracture or suspicious marrow lesion. Small L3 superior endplate Schmorl's node. Degenerative endplate changes from C7-E9, predominantly Modic type 2. Conus medullaris and cauda equina: Conus extends to the L1 level. Conus and cauda equina appear normal. Paraspinal and other soft tissues: Unremarkable. Disc levels: Disc desiccation throughout the lumbar spine. Mild disc space narrowing at L2-3 and L3-4 with moderate narrowing at L4-5 and severe narrowing at L5-S1. T12-L1 negative. L1-2: Slight facet hypertrophy without disc herniation or stenosis. L2-3: Mild disc bulging slightly eccentric to the left and mild facet and ligamentum flavum hypertrophy without stenosis. L3-4: Disc bulging eccentric to the right and moderate facet and ligamentum flavum hypertrophy result in mild right lateral recess stenosis and mild-to-moderate right and mild left neural foraminal stenosis without significant spinal stenosis. L4-5: Disc bulging, endplate spurring, and moderate facet and ligamentum flavum hypertrophy result in moderate right and mild left neural foraminal stenosis  without spinal stenosis. L5-S1: Disc bulging, endplate spurring, and mild facet hypertrophy result in mild left greater than right neural foraminal stenosis without spinal stenosis. IMPRESSION: Multilevel lumbar disc and facet degeneration with mild-to-moderate neural foraminal stenosis as above. No significant spinal stenosis. Electronically Signed   By: Logan Bores M.D.   On: 03/09/2019 15:03   US Carotid Bilateral  Result Date: 03/08/2019 CLINICAL DATA:  85 year old male with carotid plaque EXAM: BILATERAL CAROTID DUPLEX ULTRASOUND TECHNIQUE: Pearline Cables scale imaging, color Doppler and duplex ultrasound were performed of bilateral carotid and vertebral arteries in the neck. COMPARISON:  None. FINDINGS: Criteria: Quantification of carotid stenosis is based on velocity parameters that correlate the residual internal carotid diameter with NASCET-based stenosis levels, using the diameter of the distal internal carotid lumen as the denominator for stenosis measurement. The following velocity measurements were obtained: RIGHT ICA: 64/18 cm/sec CCA: 38/10 cm/sec SYSTOLIC ICA/CCA RATIO:  0.7 ECA:  113 cm/sec LEFT ICA: 73/22 cm/sec CCA: 17/51 cm/sec SYSTOLIC ICA/CCA RATIO:  0.9 ECA:  118 cm/sec RIGHT CAROTID ARTERY: Mild heterogeneous atherosclerotic plaque in the proximal internal carotid artery. By peak systolic velocity criteria, the estimated stenosis remains less than 50%. RIGHT VERTEBRAL ARTERY:  Patent with normal antegrade flow. LEFT CAROTID ARTERY: Mild heterogeneous atherosclerotic plaque in the proximal internal carotid artery. By peak systolic velocity criteria, the estimated stenosis remains less than 50%. LEFT VERTEBRAL ARTERY:  Patent with normal antegrade flow. IMPRESSION: 1. Mild (1-49%) stenosis proximal right internal carotid artery secondary to heterogenous atherosclerotic plaque. 2. Mild (1-49%) stenosis proximal left internal carotid artery secondary to heterogenous atherosclerotic plaque. 3.  Vertebral arteries are patent with normal antegrade flow. Signed, Criselda Peaches, MD, [facility] Radiology Electronically Signed   By: Jacqulynn Cadet M.D.   On: 03/08/2019 10:46     Assessment and Recommendation  85 y.o. male with known coronary artery disease hypertension hyperlipidemia chronic diastolic dysfunction heart failure having dizziness weakness appearing to improve without evidence of myocardial infarction or current congestive heart failure now with new onset of atrial fibrillation with rapid ventricular rate and symptoms  of palpitations now spontaneously converted to normal sinus rhythm and feeling well 1.  Continue medication management for hypertension control including losartan.  Would likely change amlodipine to diltiazem for heart rate control of atrial fibrillation with rapid ventricular rate with dose of 120 mg orally each day following closely for the possibility of bradycardia   2.  No evidence of heart failure or pulmonary edema at this time and therefore no additional diuretics necessary 3.  Continue high intensity cholesterol therapy with atorvastatin 4.  Further adjustments of medication management after above to assess need in further intervention 5.  Anticoagulation with Eliquis 5 mg twice per day for further risk reduction stroke with atrial fibrillation for a minimum of 1 month after discharge 6.  Begin ambulation and follow for improvements of symptoms and possible discharged home with follow-up in the office next week for further adjustments of medication management Signed, Arnoldo Hooker M.D. FACC

## 2019-03-11 NOTE — TOC Progression Note (Signed)
Transition of Care [facility]) - Progression Note    Patient Details  Name: Scott Cortez MRN: [id number] Date of Birth: 1934-01-29  Transition of Care [facility]) CM/SW [place], LCSW Phone Number: 03/11/2019, 1:01 PM  Clinical Narrative: The only SNF that offered a bed was [facility] in [place]. Patient prefers to return home with home health through [facility]. Plan is for discharge tomorrow. Asked MD for DME order for rolling walker.    Expected Discharge Plan: [place]    Expected Discharge Plan and Services Expected Discharge Plan: [place] Choice: Home Health, Durable Medical Equipment Living arrangements for the past 2 months: [place]: PT [place]: [place] ([place]) Date [place]: 03/09/19   Representative spoke with at [place]: Floydene Flock   Social Determinants of Health (SDOH) Interventions    Readmission Risk Interventions No flowsheet data found.

## 2019-03-11 NOTE — Progress Notes (Addendum)
New referral for [facility] community Palliative program to follow at home received from [place]. Plan is for discharge home tomorrow with [facility] following. Patient information given to referral. Flo Shanks BSN, RN, [place] [phone]

## 2019-03-11 NOTE — Progress Notes (Signed)
Physical Therapy Treatment Patient Details Name: Scott Cortez MRN: [id number] DOB: 1933-10-18 Today's Date: 03/11/2019    History of Present Illness Pt is an 85 year old male admitted with c/o weakness and report of 6 falls in the past two months. Pt monitored for bradycardia but not significant findings with imaging.  PMH includes MI, Htn, HLD, neuropathy, glaucoma and bradycardia.    PT Comments    Pt presented with min deficits in strength, transfers, gait, and balance. Pt denies pain. Pt required min cuing for multiple sit<>stand transfers, and was able to safely lean anteriorly and stand more efficiently. Pt ambulated 100 ft to reach staircase. Pt ascended stairs with step-through gait and good concentric control and he reported he "could keep going". Before descent pt repeated/taught-back the pattern for descent and correctly and safely descend stairs with CGA and min cuing. Pt took a short rest and then walked 300 ft, including starts/stops, cervical rotation, flexion, and extension. Pt vitals monitored throughout, SpO2 remain around 95%, and HR increased appropriately with exercise from 74 to 89. In room with CGA, pt stood without AD and successfully completed standing with feet apart, together, and reached and moved items across his table back and forth with increase difficulty with no LOB. Pt will benefit from HHPT services upon discharge to safely address above deficits for decreased caregiver assistance and eventual return to PLOF.    Follow Up Recommendations  Home health PT;Supervision/Assistance - 24 hour     Equipment Recommendations  Rolling walker with 5" wheels    Recommendations for Other Services       Precautions / Restrictions Precautions Precautions: Fall Precaution Comments: Impulsive Restrictions Weight Bearing Restrictions: No    Mobility  Bed Mobility Overal bed mobility: Independent                Transfers Overall transfer level: Needs  assistance Equipment used: Rolling walker (2 wheeled) Transfers: Sit to/from Stand Sit to Stand: Supervision         General transfer comment: MIN verbal/tactile cues for safe hand placement relative to RW ("push from bed")  Ambulation/Gait Ambulation/Gait assistance: Supervision Gait Distance (Feet): 300 Feet x1, 100 ft x1 Assistive device: Rolling walker (2 wheeled) Gait Pattern/deviations: Step-through pattern;Drifts right/left Gait velocity: Near normal   General Gait Details: Pt was able to increase gait, start and stop on command, rotate head left, right, up, and down, with minimal slowing to gait speed.   Stairs Stairs: Yes Stairs assistance: Min guard Stair Management: Two rails   General stair comments: Pt used step-through gait and good concentric control, paused at the top of stairs for pt teach-back on how to properly descend - used good progression of hands ahead of feet, with a step-to pattern and good eccentric control. Min SOB afterward.   Wheelchair Mobility    Modified Rankin (Stroke Patients Only)       Balance Overall balance assessment: Needs assistance Sitting-balance support: Feet unsupported;No upper extremity supported Sitting balance-Leahy Scale: Good Sitting balance - Comments: G static, F dynamic-use of UE for support   Standing balance support: Bilateral upper extremity supported;During functional activity Standing balance-Leahy Scale: Good Standing balance comment: Pt moderate use of UEs with RW during funcitonal mobility. No LOB noted, min verbal cues to seprate feet when turning corner to prevent fall 2/2 scissoring               High Level Balance Comments: Pt stood unsupported with feet apart for 30 seconds, feet together  for 30 seconds, and completed reaching, rotating, and dynamic weighshifting without LOB.            Cognition Arousal/Alertness: Awake/alert Behavior During Therapy: WFL for tasks  assessed/performed Overall Cognitive Status: Within Functional Limits for tasks assessed                                 General Comments: Pt eager, almost impulsive. Requires MOD verbal/visual/tactile cues for safety awareness with use of FWW including safe hand placemenet and cues for obsatcke avoidance during fxl mobility for fall prevention.      Exercises Total Joint Exercises Hip ABduction/ADduction: AROM;Both;5 reps Marching in Standing: AROM;Strengthening;Both;5 reps;10 reps Other Exercises Other Exercises: OT facilitates education re: fall prevention strategies for home: scanning environment, noting foot placement, and safe use of RW including hand/foot placement with sit<>stand. Pt verbalized understanding, but could benefit from carryover of safety education with standing ADLs in the household after d/c. Other Exercises: Pt stood unsupported with feet apart for 30 seconds, feet together for 30 seconds, and completed reaching, rotating, and dynamic weighshifting x3 with increasing level of challenge without LOB.    General Comments        Pertinent Vitals/Pain Pain Assessment: No/denies pain    Home Living                      Prior Function            PT Goals (current goals can now be found in the care plan section) Acute Rehab PT Goals Patient Stated Goal: To return home and figure out what is causing his falls. Progress towards PT goals: Progressing toward goals    Frequency    Min 2X/week      PT Plan Current plan remains appropriate    Co-evaluation              AM-PAC PT "6 Clicks" Mobility   Outcome Measure  Help needed turning from your back to your side while in a flat bed without using bedrails?: None Help needed moving from lying on your back to sitting on the side of a flat bed without using bedrails?: None Help needed moving to and from a bed to a chair (including a wheelchair)?: None Help needed standing up from  a chair using your arms (e.g., wheelchair or bedside chair)?: None Help needed to walk in hospital room?: A Little Help needed climbing 3-5 steps with a railing? : A Little 6 Click Score: 22    End of Session Equipment Utilized During Treatment: Gait belt Activity Tolerance: Patient tolerated treatment well Patient left: in chair;with call bell/phone within reach;with chair alarm set Nurse Communication: Mobility status PT Visit Diagnosis: Unsteadiness on feet (R26.81);Repeated falls (R29.6);Muscle weakness (generalized) (M62.81)     Time: 4431-5400 PT Time Calculation (min) (ACUTE ONLY): 30 min  Charges:              Juanda Crumble "Gus" Jeannette Corpus, SPT  03/11/19, 4:28 PM

## 2019-03-11 NOTE — Progress Notes (Signed)
Patient ID: Scott GourdLawrence J Duran, male   DOB: 06/29/1933, 85 y.o.   MRN: [id number]  [employer] PROGRESS NOTE  Scott GourdLawrence J Grantz [id number] DOB: 08/28/1933 DOA: 03/07/2019 [facility]: [facility]  HPI/Subjective: Patient now would like to go home than STR, afraid of more falls and hesitant to start new blood thinner Objective: Vitals:   03/11/19 0740 03/11/19 1604  BP: 104/68 94/68  Pulse: (!) 53 67  Resp: 18 18  Temp: 98.1 F (36.7 C) 98 F (36.7 C)  SpO2: 97% 96%    Intake/Output Summary (Last 24 hours) at 03/11/2019 1630 Last data filed at 03/11/2019 0951 Gross per 24 hour  Intake 240 ml  Output 200 ml  Net 40 ml   Filed Weights   03/10/19 0427 03/11/19 0500 03/11/19 0520  Weight: 80 kg 80 kg 81.6 kg    ROS: Review of Systems  Constitutional: Negative for chills and fever.  Eyes: Negative for blurred vision.  Respiratory: Negative for cough and shortness of breath.   Cardiovascular: Negative for chest pain.  Gastrointestinal: Negative for abdominal pain, constipation, diarrhea, nausea and vomiting.  Genitourinary: Negative for dysuria.  Musculoskeletal: Negative for joint pain.  Neurological: Positive for dizziness. Negative for headaches.   Exam: Physical Exam  Constitutional: He is oriented to person, place, and time.  HENT:  Nose: No mucosal edema.  Mouth/Throat: No oropharyngeal exudate or posterior oropharyngeal edema.  Eyes: Pupils are equal, round, and reactive to light. Conjunctivae, EOM and lids are normal.  Neck: No JVD present. Carotid bruit is not present. No edema present. No thyroid mass and no thyromegaly present.  Cardiovascular: S1 normal and S2 normal. Bradycardia present. Exam reveals no gallop.  No murmur heard. Pulses:      Dorsalis pedis pulses are 2+ on the right side and 2+ on the left side.  Respiratory: No respiratory distress. He has no wheezes. He has no rhonchi. He has no rales.  GI: Soft. Bowel sounds are normal.  There is no abdominal tenderness.  Musculoskeletal:     Right ankle: He exhibits no swelling.     Left ankle: He exhibits no swelling.  Lymphadenopathy:    He has no cervical adenopathy.  Neurological: He is alert and oriented to person, place, and time. No cranial nerve deficit.  Left hand finger-to-nose nose slightly impaired.  Unsteady with Romberg testing  Skin: Skin is warm. No rash noted. Nails show no clubbing.  Psychiatric: He has a normal mood and affect.      Data Reviewed: Basic Metabolic Panel: Recent Labs  Lab 03/07/19 1331 03/08/19 0531 03/11/19 0439  NA 138 139 139  K 4.0 3.8 3.9  CL 102 104 103  CO2 26 26 27   GLUCOSE 149* 110* 104*  BUN 13 11 13   CREATININE 1.09 0.65 0.81  CALCIUM 9.3 9.1 9.1   Liver Function Tests: Recent Labs  Lab 03/07/19 1331  AST 25  ALT 13  ALKPHOS 82  BILITOT 1.7*  PROT 6.7  ALBUMIN 3.9   No results for input(s): LIPASE, AMYLASE in the last 168 hours. No results for input(s): AMMONIA in the last 168 hours. CBC: Recent Labs  Lab 03/07/19 1331 03/08/19 0531 03/11/19 0439  WBC 4.7 4.7 4.9  NEUTROABS 3.2  --   --   HGB 13.7 13.1 12.8*  HCT 40.5 39.0 37.4*  MCV 91.0 91.1 89.7  PLT 174 180 181     Recent Results (from the past 240 hour(s))  SARS CORONAVIRUS 2 (TAT  6-24 HRS) Nasopharyngeal Nasopharyngeal Swab     Status: None   Collection Time: 03/07/19  6:59 PM   Specimen: Nasopharyngeal Swab  Result Value Ref Range Status   SARS Coronavirus 2 NEGATIVE NEGATIVE Final    Comment: (NOTE) SARS-CoV-2 target nucleic acids are NOT DETECTED. The SARS-CoV-2 RNA is generally detectable in upper and lower respiratory specimens during the acute phase of infection. Negative results do not preclude SARS-CoV-2 infection, do not rule out co-infections with other pathogens, and should not be used as the sole basis for treatment or other patient management decisions. Negative results must be combined with clinical  observations, patient history, and epidemiological information. The expected result is Negative. Fact Sheet for Patients: HairSlick.no Fact Sheet for Healthcare Providers: quierodirigir.com This test is not yet approved or cleared by the [place] FDA and  has been authorized for detection and/or diagnosis of SARS-CoV-2 by FDA under an Emergency Use Authorization (EUA). This EUA will remain  in effect (meaning this test can be used) for the duration of the COVID-19 declaration under Section 56 4(b)(1) of the Act, 21 U.S.C. section 360bbb-3(b)(1), unless the authorization is terminated or revoked sooner. Performed at [facility], [address] [place], [place] [address]      Studies: No results found.  Scheduled Meds: . aspirin EC  81 mg Oral Daily  . atorvastatin  20 mg Oral Daily  . diltiazem  120 mg Oral Daily  . famotidine  20 mg Oral QHS  . pantoprazole  40 mg Oral Daily  . polyethylene glycol  17 g Oral Daily  . prednisoLONE-Gatifloxacin  1 drop Both Eyes BID  . sodium chloride flush  3 mL Intravenous Q12H  . tamsulosin  0.4 mg Oral Daily   Continuous Infusions:   Assessment/Plan:  1. Paroxysmal atrial fibrillation with intermittent episodes of bradycardia Discontinued patient's amlodipine and patient is started on diltiazem for heart rate control with close monitoring for the possibility of bradycardia. Not a good candidate for anticoagulation considering recurrent falls (had 6 falls this yr) and reports he loses balance easily and lives alone 2. Near syncope, unsteady with Romberg, impaired left finger nose movements.  MRI of the brain with no acute findings  Patient already on aspirin Plavix and atorvastatin.  PT is recommending home health PT with 24-hour supervision  3. Bilateral lower extremity weakness and decreased mobility-probably from peripheral neuropathy MRI of the lumbar spine-has revealed  degenerative disorder with foraminal stenosis but no spinal stenosis.  Neurology consult placed.  B12, B1, folate, heavy metal screening, serum protein electrophoresis are ordered outpatient follow-up with neurology for nerve conduction velocities as scheduled 4. Hypertension continue usual medications 5. Hyperlipidemia on atorvastatin 6. History of diastolic congestive heart failure no signs of heart failure currently.  Careful with fluids. 7. Disposition: He refused STR/SNF. Doubt payer will cover it either. He is agreeable to go home with HHPT and PC    Code Status:     Code Status Orders  (From admission, onward)         Start     Ordered   03/07/19 2030  Full code  Continuous     03/07/19 2033        Code Status History    This patient has a current code status but no historical code status.   [facility] Care Planning Activity     Family Communication: I will call patient's healthcare power of attorney, his niece Barbara-[phone] tomorrow Possible Dispo tomorrow  Consultants:  Cardiology  Neurology  Time spent: 35 minutes  Balraj Brayfield [employer]

## 2019-03-11 NOTE — Progress Notes (Signed)
Occupational Therapy Treatment Patient Details Name: Scott Cortez MRN: [id number] DOB: [birth year] Today's Date: 03/11/2019    History of present illness Pt is an 85 year old male admitted with c/o weakness and report of 6 falls in the past two months. Pt monitored for bradycardia but not significant findings with imaging.  PMH includes MI, Htn, HLD, neuropathy, glaucoma and bradycardia.   OT comments  Pt seen this date for OT tx to address fxl mobility and ADL transfer safety/fall prevention strategies. Pt tolerates fxl mobility within the nursing unit x3 laps with RW with Supv. One instance of CGA provided when turning a corner as pt with feet too close together. No overt LOB. OT facilitates education re: using visual input for understanding of where feet are relative to one another for fall prevention. Pt verbalized understanding. Could benfit from f/u in home setting for environmental modification for fall prevention. HHOT is still most appropriate d/c dispo.     Follow Up Recommendations  Home health OT;Supervision/Assistance - 24 hour    Equipment Recommendations  3 in 1 bedside commode    Recommendations for Other Services      Precautions / Restrictions Precautions Precautions: Fall Precaution Comments: Impulsive Restrictions Weight Bearing Restrictions: No       Mobility Bed Mobility Overal bed mobility: Independent                Transfers Overall transfer level: Needs assistance Equipment used: Rolling walker (2 wheeled) Transfers: Sit to/from Stand Sit to Stand: Supervision         General transfer comment: MIN verbal/tactile cues for safe hand placement relative to RW ("push from bed")    Balance Overall balance assessment: Needs assistance Sitting-balance support: Feet unsupported;No upper extremity supported Sitting balance-Leahy Scale: Good Sitting balance - Comments: G static, F dynamic-use of UE for support   Standing balance support:  Bilateral upper extremity supported;During functional activity Standing balance-Leahy Scale: Good Standing balance comment: Pt moderate use of UEs with RW during funcitonal mobility. No LOB noted, min verbal cues to seprate feet when turning corner to prevent fall 2/2 scissoring               High Level Balance Comments: Pt stood unsupported with feet apart for 30 seconds, feet together for 30 seconds, and completed reaching, rotating, and dynamic weighshifting without LOB.           ADL either performed or assessed with clinical judgement   ADL                                         General ADL Comments: Pt dons socks prior to fxl mobility with one verbal prompt to encourage pt safety.     Vision Baseline Vision/History: Wears glasses Wears Glasses: Reading only Patient Visual Report: No change from baseline     Perception     Praxis      Cognition Arousal/Alertness: Awake/alert Behavior During Therapy: WFL for tasks assessed/performed Overall Cognitive Status: Within Functional Limits for tasks assessed                                 General Comments: Pt eager, almost impulsive. Requires MOD verbal/visual/tactile cues for safety awareness with use of FWW including safe hand placemenet and cues for obsatcke avoidance during fxl mobility for fall  prevention.        Exercises Other Exercises Other Exercises: OT facilitates education re: fall prevention strategies for home: scanning environment, noting foot placement, and safe use of RW including hand/foot placement with sit<>stand. Pt verbalized understanding, but could benefit from carryover of safety education with standing ADLs in the household after d/c. Other Exercises: Pt stood unsupported with feet apart for 30 seconds, feet together for 30 seconds, and completed reaching, rotating, and dynamic weighshifting x3 with increasing level of challenge without LOB.   Shoulder  Instructions       General Comments      Pertinent Vitals/ Pain       Pain Assessment: No/denies pain  Home Living                              Prior Functioning/Environment              Frequency  Min 2X/week        Progress Toward Goals  OT Goals(current goals can now be found in the care plan section)  Progress towards OT goals: Progressing toward goals  Acute Rehab OT Goals Patient Stated Goal: To return home and figure out what is causing his falls. OT Goal Formulation: With patient Time For Goal Achievement: 03/23/19 Potential to Achieve Goals: Good  Plan      Co-evaluation                 AM-PAC OT "6 Clicks" Daily Activity     Outcome Measure   Help from another person eating meals?: None Help from another person taking care of personal grooming?: A Little Help from another person toileting, which includes using toliet, bedpan, or urinal?: A Little Help from another person bathing (including washing, rinsing, drying)?: A Little Help from another person to put on and taking off regular upper body clothing?: None Help from another person to put on and taking off regular lower body clothing?: A Little 6 Click Score: 20    End of Session Equipment Utilized During Treatment: Gait belt;Rolling walker  OT Visit Diagnosis: Other abnormalities of gait and mobility (R26.89);Repeated falls (R29.6)   Activity Tolerance Patient tolerated treatment well   Patient Left in chair;with chair alarm set;with call bell/phone within reach   Nurse Communication Mobility status;Other (comment)        Time: 7062-3762 OT Time Calculation (min): 38 min  Charges: OT General Charges $OT Visit: 1 Visit OT Treatments $Self Care/Home Management : 8-22 mins $Therapeutic Activity: 23-37 mins  Gerrianne Scale, MS, OTR/L ascom [phone] or [phone] 03/11/19, 4:26 PM

## 2019-03-12 LAB — CBC
HCT: 36.6 % — ABNORMAL LOW (ref 39.0–52.0)
Hemoglobin: 12.5 g/dL — ABNORMAL LOW (ref 13.0–17.0)
MCH: 30.6 pg (ref 26.0–34.0)
MCHC: 34.2 g/dL (ref 30.0–36.0)
MCV: 89.7 fL (ref 80.0–100.0)
Platelets: 175 10*3/uL (ref 150–400)
RBC: 4.08 MIL/uL — ABNORMAL LOW (ref 4.22–5.81)
RDW: 13 % (ref 11.5–15.5)
WBC: 4.7 10*3/uL (ref 4.0–10.5)
nRBC: 0 % (ref 0.0–0.2)

## 2019-03-12 LAB — BASIC METABOLIC PANEL
Anion gap: 9 (ref 5–15)
BUN: 17 mg/dL (ref 8–23)
CO2: 28 mmol/L (ref 22–32)
Calcium: 9 mg/dL (ref 8.9–10.3)
Chloride: 101 mmol/L (ref 98–111)
Creatinine, Ser: 0.89 mg/dL (ref 0.61–1.24)
GFR calc Af Amer: >60 mL/min (ref >60)
GFR calc non Af Amer: >60 mL/min (ref >60)
Glucose, Bld: 99 mg/dL (ref 70–99)
Potassium: 3.8 mmol/L (ref 3.5–5.1)
Sodium: 138 mmol/L (ref 135–145)

## 2019-03-12 LAB — GLUCOSE, CAPILLARY: Glucose-Capillary: 93 mg/dL (ref 70–99)

## 2019-03-12 MED ORDER — DILTIAZEM HCL ER COATED BEADS 120 MG PO CP24: 120.0000 mg | ORAL_CAPSULE | Freq: Every day | ORAL | 0 refills | Status: DC

## 2019-03-12 MED ORDER — APIXABAN 5 MG PO TABS: 5.0000 mg | ORAL_TABLET | Freq: Two times a day (BID) | ORAL | 0 refills | Status: AC

## 2019-03-12 MED ORDER — APIXABAN 5 MG PO TABS
5.0000 mg | ORAL_TABLET | Freq: Two times a day (BID) | ORAL | Status: DC
  Administered 2019-03-12: 5 mg via ORAL
  Filled 2019-03-12: qty 1

## 2019-03-12 NOTE — Progress Notes (Signed)
Patient being discharged home, no distress noted. Iv removed, tele monitor removed. Home medication returned to patient. Security returned one [place] and one gold bracelet. Discharge instructions gone over with patient, patient verbalized that he understood instructions. Prescriptions electronically submitted.  Eliquis coupon given to patient. Patient taken off floor via wheelchair by staff

## 2019-03-12 NOTE — Discharge Instructions (Signed)
Near-Syncope Near-syncope is when you suddenly get weak or dizzy, or you feel like you might pass out (faint). This may also be called presyncope. This is due to a lack of blood flow to the brain. During an episode of near-syncope, you may:  Feel dizzy, weak, or light-headed.  Feel sick to your stomach (nauseous).  See all white or all black.  See spots.  Have cold, clammy skin. This condition is caused by a sudden decrease in blood flow to the brain. This decrease can result from various causes, but most of those causes are not dangerous. However, near-syncope may be a sign of a serious medical problem, so it is important to seek medical care. Follow these instructions at home: Medicines  Take over-the-counter and prescription medicines only as told by your doctor.  If you are taking blood pressure or heart medicine, get up slowly and spend many minutes getting ready to sit and then stand. This can help with dizziness. General instructions  Be aware of any changes in your symptoms.  Talk with your doctor about your symptoms. You may need to have testing to find the cause of your near-syncope.  If you start to feel like you might pass out, lie down right away. Raise (elevate) your feet above the level of your heart. Breathe deeply and steadily. Wait until all of the symptoms are gone.  Have someone stay with you until you feel stable.  Do not drive, use machinery, or play sports until your doctor says it is okay.  Drink enough fluid to keep your pee (urine) pale yellow.  Keep all follow-up visits as told by your doctor. This is important. Get help right away if you:  Have a seizure.  Have pain in your: ? Chest. ? Belly (abdomen). ? Back.  Faint once or more than once.  Have a very bad headache.  Are bleeding from your mouth or butt.  Have black or tarry poop (stool).  Have a very fast or uneven heartbeat (palpitations).  Are mixed up (confused).  Have trouble  walking.  Are very weak.  Have trouble seeing. These symptoms may be an emergency. Do not wait to see if the symptoms will go away. Get medical help right away. Call your local emergency services (911 in the [place] Do not drive yourself to the hospital. Summary  Near-syncope is when you suddenly get weak or dizzy, or you feel like you might pass out (faint).  This condition is caused by a lack of blood flow to the brain.  Near-syncope may be a sign of a serious medical problem, so it is important to seek medical care. This information is not intended to replace advice given to you by your health care provider. Make sure you discuss any questions you have with your health care provider. Document Released: 10/17/2007 Document Revised: 08/22/2018 Document Reviewed: 03/19/2018 Elsevier Patient Education  2020 [employer]  

## 2019-03-12 NOTE — TOC Transition Note (Addendum)
Transition of Care [facility]) - CM/SW Discharge Note   Patient Details  Name: Scott Cortez MRN: [id number] Date of Birth: 05/02/1934  [facility] CM/SW Contact:  Candie Chroman, LCSW Phone Number: 03/12/2019, 8:37 AM   Clinical Narrative: Patient has orders to discharge home today. [facility] is aware. [employer] is aware and will bring up his rolling walker shortly. No further concerns. CSW signing off.  10:20 am: Gave RN Eliquis 30-day free card. CSW signing off.    Final next level of care: Home w Home Health Services Barriers to Discharge: Barriers Resolved   Patient Goals and CMS Choice   CMS Medicare.gov Compare Post Acute Care list provided to:: Patient Choice offered to / list presented to : Patient  Discharge Placement                Patient to be transferred to facility by: Patient said niece will pick him up.   Patient and family notified of of transfer: 03/12/19  Discharge Plan and Services     Post Acute Care Choice: [place], Durable Medical Equipment          DME Arranged: Walker rolling DME Agency: [employer] Date DME Agency Contacted: 03/11/19   Representative spoke with at DME Agency: [place]: RN, PT, OT, Nurse's Aide, Social Work [employer] Agency: [place] ([place]) Date [place]: 03/12/19   Representative spoke with at [place]: [place] (SDOH) Interventions     Readmission Risk Interventions No flowsheet data found.

## 2019-03-12 NOTE — Progress Notes (Signed)
Subjective:  Patient feels reasonably well still unsteady on his feet denies any palpitations no bleeding ready to go home denies any shortness of breath.  History of falls no injuries.  Objective:  Vital Signs in the last 24 hours: Temp:  [97.5 F (36.4 C)-98 F (36.7 C)] 97.5 F (36.4 C) (10/29 0819) Pulse Rate:  [51-67] 55 (10/29 0819) Resp:  [11-19] 19 (10/29 0819) BP: (94-128)/(62-68) 128/62 (10/29 0819) SpO2:  [95 %-96 %] 95 % (10/29 0819) Weight:  [79.5 kg] 79.5 kg (10/29 0337)  Intake/Output from previous day: 10/28 0701 - 10/29 0700 In: 240 [P.O.:240] Out: -  Intake/Output from this shift: Total I/O In: -  Out: 120 [Urine:120]  Physical Exam: General appearance: appears stated age Neck: no adenopathy, no carotid bruit, no JVD, supple, symmetrical, trachea midline and thyroid not enlarged, symmetric, no tenderness/mass/nodules Lungs: clear to auscultation bilaterally Heart: irregularly irregular rhythm Abdomen: soft, non-tender; bowel sounds normal; no masses,  no organomegaly Extremities: extremities normal, atraumatic, no cyanosis or edema Pulses: 2+ and symmetric Skin: Skin color, texture, turgor normal. No rashes or lesions Neurologic: Alert and oriented X 3, normal strength and tone. Normal symmetric reflexes. Normal coordination and gait  Lab Results: Recent Labs    03/11/19 0439 03/12/19 0451  WBC 4.9 4.7  HGB 12.8* 12.5*  PLT 181 175   Recent Labs    03/11/19 0439 03/12/19 0451  NA 139 138  K 3.9 3.8  CL 103 101  CO2 27 28  GLUCOSE 104* 99  BUN 13 17  CREATININE 0.81 0.89   No results for input(s): TROPONINI in the last 72 hours.  Invalid input(s): CK, MB Hepatic Function Panel No results for input(s): PROT, ALBUMIN, AST, ALT, ALKPHOS, BILITOT, BILIDIR, IBILI in the last 72 hours. No results for input(s): CHOL in the last 72 hours. No results for input(s): PROTIME in the last 72 hours.  Imaging: Imaging results have been  reviewed  Cardiac Studies:  Assessment/Plan:  Arrhythmia Atrial Fibrillation Chest Pain Coronary Artery Disease Coronary Artery Stent Palpitations Shortness of Breath  Bradycardia Hyperlipidemia GERD Frequent falls Generalized weakness . Plan Atrial fibrillation controlled rate would recommend starting anticoagulation with Eliquis 5 mg twice a day discontinue aspirin and call Bradycardia no claudication with permanent pacemaker no rate control indicated Hypertension reasonably controlled continue conservative medical therapy Ataxia poor balance recommend have the patient use a walker or cane and consider physical therapy for balance gait training Hyperlipidemia we will continue Lipitor therapy for lipid management History of congestive heart failure but no evidence at this point patient compensated with preserved left ventricular function Bilateral lower extremity weakness would recommend physical therapy patient prefers not to go to skilled nursing for rehab Near syncope possibly related to bradycardia plus related atrial fibrillation past related to relative hypotension recommend conservative therapy maintain hydration continue amlodipine Multiple falls advised patient to reduce fall proceed with fall precautions use cane and walker GERD continue Protonix therapy for reflux symptoms BPH maintained on tamsulosin therapy Have the patient follow-up with cardiology next week   LOS: 5 days    Scott Cortez D Kimmy Parish 03/12/2019, 8:54 AM

## 2019-03-14 NOTE — Discharge Summary (Signed)
5        [facility] [place] [facility]   PATIENT NAME: Scott Cortez    MR#:  [id number]  DATE OF BIRTH:  11-Jan-1934  DATE OF ADMISSION:  03/07/2019   ADMITTING PHYSICIAN: Jimmye Norman, NP  DATE OF DISCHARGE: 03/12/2019 11:23 AM  PRIMARY CARE PHYSICIAN: [facility], [facility]   ADMISSION DIAGNOSIS:  Weakness [R53.1] Near syncope [R55] DISCHARGE DIAGNOSIS:  Active Problems:   Near syncope  SECONDARY DIAGNOSIS:   Past Medical History:  Diagnosis Date   Bladder neck obstruction    Bradycardia    CHF (congestive heart failure) (HCC)    Chronic pancreatitis (HCC)    Coronary artery disease    Dental bridge present    upper - bilateral   GERD (gastroesophageal reflux disease)    Glaucoma    Hyperlipidemia    Hypertension    Myocardial infarction (HCC)    Neuropathy    feet   HOSPITAL COURSE:  1. Paroxysmal atrial fibrillation with intermittent episodes of bradycardia Discontinued patient's amlodipine and patient is started on diltiazem for heart rate control with close monitoring for the possibility of bradycardia.   Cardiology would like to start Eliquis and stop aspirin and Plavix. 2. Near syncope, unsteady with Romberg, impaired left finger nose movements.  MRI of the brain with no acute findings  PT is recommending home health PT with 24-hour supervision  3. Bilateral lower extremity weakness and decreased mobility-probably from peripheral neuropathy MRI of the lumbar spine-has revealed degenerative disorder with foraminal stenosis but no spinal stenosis. outpatient follow-up with neurology for nerve conduction velocities as scheduled 4. Hypertension continue usual medications 5. Hyperlipidemia on atorvastatin 6. History of diastolic congestive heart failure no signs of heart failure currently.  Careful with fluids. 7. Disposition: He refused STR/SNF. He is agreeable to go home with HHPT and PC  DISCHARGE  CONDITIONS:  Stable CONSULTS OBTAINED:  Treatment Team:  Kym Groom, MD DRUG ALLERGIES:   Allergies  Allergen Reactions   Pneumococcal Vaccines Swelling   DISCHARGE MEDICATIONS:   Allergies as of 03/12/2019      Reactions   Pneumococcal Vaccines Swelling      Medication List    STOP taking these medications   amLODipine 5 MG tablet Commonly known as: NORVASC   aspirin 325 MG tablet   clopidogrel 75 MG tablet Commonly known as: PLAVIX   losartan 50 MG tablet Commonly known as: COZAAR     TAKE these medications   apixaban 5 MG Tabs tablet Commonly known as: ELIQUIS Take 1 tablet (5 mg total) by mouth 2 (two) times daily.   atorvastatin 20 MG tablet Commonly known as: LIPITOR Take 20 mg by mouth daily.   brimonidine 0.2 % ophthalmic solution Commonly known as: ALPHAGAN Place 1 drop into both eyes 2 (two) times daily.   diltiazem 120 MG 24 hr capsule Commonly known as: CARDIZEM CD Take 1 capsule (120 mg total) by mouth daily.   latanoprost 0.005 % ophthalmic solution Commonly known as: XALATAN Place 1 drop into both eyes every morning.   omeprazole 20 MG capsule Commonly known as: PRILOSEC Take 20 mg by mouth daily.   ranitidine 150 MG capsule Commonly known as: ZANTAC Take 150 mg by mouth daily.   sildenafil 100 MG tablet Commonly known as: VIAGRA Take 100 mg by mouth as directed.   tamsulosin 0.4 MG Caps capsule Commonly known as: FLOMAX Take 0.4 mg by mouth daily.   timolol 0.5 %  ophthalmic solution Commonly known as: BETIMOL Place 1 drop into both eyes 2 (two) times daily.      DISCHARGE INSTRUCTIONS:   DIET:  Cardiac diet DISCHARGE CONDITION:  Fair ACTIVITY:  Activity as tolerated OXYGEN:  Home Oxygen: No.  Oxygen Delivery: room air DISCHARGE LOCATION:  home home health PT  If you experience worsening of your admission symptoms, develop shortness of breath, life threatening emergency, suicidal or homicidal thoughts you  must seek medical attention immediately by calling 911 or calling your MD immediately  if symptoms less severe.  You Must read complete instructions/literature along with all the possible adverse reactions/side effects for all the Medicines you take and that have been prescribed to you. Take any new Medicines after you have completely understood and accpet all the possible adverse reactions/side effects.   Please note  You were cared for by a hospitalist during your hospital stay. If you have any questions about your discharge medications or the care you received while you were in the hospital after you are discharged, you can call the unit and asked to speak with the hospitalist on call if the hospitalist that took care of you is not available. Once you are discharged, your primary care physician will handle any further medical issues. Please note that NO REFILLS for any discharge medications will be authorized once you are discharged, as it is imperative that you return to your primary care physician (or establish a relationship with a primary care physician if you do not have one) for your aftercare needs so that they can reassess your need for medications and monitor your lab values.    On the day of Discharge:  VITAL SIGNS:  Blood pressure 128/62, pulse (!) 55, temperature (!) 97.5 F (36.4 C), temperature source Oral, resp. rate 19, height 5\' 9"  (1.753 m), weight 79.5 kg, SpO2 95 %. PHYSICAL EXAMINATION:  GENERAL:  85 y.o.-year-old patient lying in the bed with no acute distress.  EYES: Pupils equal, round, reactive to light and accommodation. No scleral icterus. Extraocular muscles intact.  HEENT: Head atraumatic, normocephalic. Oropharynx and nasopharynx clear.  NECK:  Supple, no jugular venous distention. No thyroid enlargement, no tenderness.  LUNGS: Normal breath sounds bilaterally, no wheezing, rales,rhonchi or crepitation. No use of accessory muscles of respiration.  CARDIOVASCULAR:  S1, S2 normal. No murmurs, rubs, or gallops.  ABDOMEN: Soft, non-tender, non-distended. Bowel sounds present. No organomegaly or mass.  EXTREMITIES: No pedal edema, cyanosis, or clubbing.  NEUROLOGIC: Cranial nerves II through XII are intact. Muscle strength 5/5 in all extremities. Sensation intact. Gait not checked.  PSYCHIATRIC: The patient is alert and oriented x 3.  SKIN: No obvious rash, lesion, or ulcer.  DATA REVIEW:   CBC Recent Labs  Lab 03/12/19 0451  WBC 4.7  HGB 12.5*  HCT 36.6*  PLT 175    Chemistries  Recent Labs  Lab 03/12/19 0451  NA 138  K 3.8  CL 101  CO2 28  GLUCOSE 99  BUN 17  CREATININE 0.89  CALCIUM 9.0     Follow-up Information    [facility]. Go on 03/17/2019.   Specialty: General Practice Why: at 1:00pm.  Contact information: [address] [place] [address] [phone]        Dorothyann Pengallwood, Dwayne D, MD. Schedule an appointment as soon as possible for a visit on 03/19/2019.   Specialties: Cardiology, Internal Medicine Why: At 10:45 a.m. Contact information: [address] [place] [address] [phone]  Management plans discussed with the patient, family and they are in agreement.  CODE STATUS: Prior   TOTAL TIME TAKING CARE OF THIS PATIENT: 45 minutes.    Max Sane M.D on 03/14/2019 at 2:18 PM  Between 7am to 6pm - Pager - [phone]  After 6pm go to [email or web address] - [employer] [facility]  Office  [phone]  CC: Primary care physician; [facility]   Note: This dictation was prepared with Dragon dictation along with smaller phrase technology. Any transcriptional errors that result from this process are unintentional.

## 2019-03-15 LAB — VITAMIN B1: Vitamin B1 (Thiamine): 91.5 nmol/L (ref 66.5–200.0)

## 2019-03-19 ENCOUNTER — Telehealth: Payer: Self-pay | Admitting: Nurse Practitioner

## 2019-03-19 NOTE — Telephone Encounter (Signed)
Called patient to schedule Palliative Consult, no answer - left message with reason for cal along with my contact information.  I also called patient's sister-in-law Scott Cortez and left a message as well.

## 2019-03-20 ENCOUNTER — Telehealth: Payer: Self-pay | Admitting: Nurse Practitioner

## 2019-03-20 NOTE — Telephone Encounter (Signed)
Called patient back to schedule Palliative Consult, as patient requested, with no answer - left message with my contact information

## 2019-04-08 ENCOUNTER — Telehealth: Payer: Self-pay | Admitting: Nurse Practitioner

## 2019-04-08 NOTE — Telephone Encounter (Signed)
Spoke with patient regarding Palliative services and he was in agreement with this.  I have scheduled a Telephone Consult for 04/20/19 @ 3:30 PM

## 2019-04-17 ENCOUNTER — Emergency Department
Admission: EM | Admit: 2019-04-17 | Discharge: 2019-04-17 | Disposition: A | Discharge status: Home / Self Care | Payer: Medicare Other | Attending: Emergency Medicine | Admitting: Emergency Medicine

## 2019-04-17 ENCOUNTER — Emergency Department: Payer: Medicare Other

## 2019-04-17 ENCOUNTER — Other Ambulatory Visit: Payer: Self-pay

## 2019-04-17 DIAGNOSIS — S29002A Unspecified injury of muscle and tendon of back wall of thorax, initial encounter: Secondary | ICD-10-CM | POA: Diagnosis present

## 2019-04-17 DIAGNOSIS — S0990XA Unspecified injury of head, initial encounter: Secondary | ICD-10-CM | POA: Insufficient documentation

## 2019-04-17 DIAGNOSIS — S29012A Strain of muscle and tendon of back wall of thorax, initial encounter: Secondary | ICD-10-CM | POA: Insufficient documentation

## 2019-04-17 DIAGNOSIS — Y999 Unspecified external cause status: Secondary | ICD-10-CM | POA: Diagnosis not present

## 2019-04-17 DIAGNOSIS — I509 Heart failure, unspecified: Secondary | ICD-10-CM | POA: Diagnosis not present

## 2019-04-17 DIAGNOSIS — Z79899 Other long term (current) drug therapy: Secondary | ICD-10-CM | POA: Diagnosis not present

## 2019-04-17 DIAGNOSIS — I110 Hypertensive heart disease with heart failure: Secondary | ICD-10-CM | POA: Insufficient documentation

## 2019-04-17 DIAGNOSIS — Z7901 Long term (current) use of anticoagulants: Secondary | ICD-10-CM | POA: Insufficient documentation

## 2019-04-17 DIAGNOSIS — Z87891 Personal history of nicotine dependence: Secondary | ICD-10-CM | POA: Diagnosis not present

## 2019-04-17 DIAGNOSIS — Y939 Activity, unspecified: Secondary | ICD-10-CM | POA: Diagnosis not present

## 2019-04-17 DIAGNOSIS — W208XXA Other cause of strike by thrown, projected or falling object, initial encounter: Secondary | ICD-10-CM | POA: Insufficient documentation

## 2019-04-17 DIAGNOSIS — Y929 Unspecified place or not applicable: Secondary | ICD-10-CM | POA: Diagnosis not present

## 2019-04-17 DIAGNOSIS — I2510 Atherosclerotic heart disease of native coronary artery without angina pectoris: Secondary | ICD-10-CM | POA: Insufficient documentation

## 2019-04-17 DIAGNOSIS — S39012A Strain of muscle, fascia and tendon of lower back, initial encounter: Secondary | ICD-10-CM

## 2019-04-17 LAB — CBC
HCT: 39.8 % (ref 39.0–52.0)
Hemoglobin: 13.9 g/dL (ref 13.0–17.0)
MCH: 30.9 pg (ref 26.0–34.0)
MCHC: 34.9 g/dL (ref 30.0–36.0)
MCV: 88.4 fL (ref 80.0–100.0)
Platelets: 178 10*3/uL (ref 150–400)
RBC: 4.5 MIL/uL (ref 4.22–5.81)
RDW: 13.1 % (ref 11.5–15.5)
WBC: 6.5 10*3/uL (ref 4.0–10.5)
nRBC: 0 % (ref 0.0–0.2)

## 2019-04-17 LAB — BASIC METABOLIC PANEL
Anion gap: 9 (ref 5–15)
BUN: 18 mg/dL (ref 8–23)
CO2: 26 mmol/L (ref 22–32)
Calcium: 9 mg/dL (ref 8.9–10.3)
Chloride: 105 mmol/L (ref 98–111)
Creatinine, Ser: 0.86 mg/dL (ref 0.61–1.24)
GFR calc Af Amer: >60 mL/min (ref >60)
GFR calc non Af Amer: >60 mL/min (ref >60)
Glucose, Bld: 95 mg/dL (ref 70–99)
Potassium: 3.6 mmol/L (ref 3.5–5.1)
Sodium: 140 mmol/L (ref 135–145)

## 2019-04-17 LAB — URINALYSIS, COMPLETE (UACMP) WITH MICROSCOPIC
Bacteria, UA: NONE SEEN
Bilirubin Urine: NEGATIVE
Glucose, UA: NEGATIVE mg/dL
Hgb urine dipstick: NEGATIVE
Ketones, ur: NEGATIVE mg/dL
Leukocytes,Ua: NEGATIVE
Nitrite: NEGATIVE
Protein, ur: NEGATIVE mg/dL
Specific Gravity, Urine: 1.018 (ref 1.005–1.030)
pH: 5 (ref 5.0–8.0)

## 2019-04-17 MED ORDER — HYDROCODONE-ACETAMINOPHEN 5-325 MG PO TABS
1.0000 | ORAL_TABLET | Freq: Once | ORAL | Status: AC
  Administered 2019-04-17: 1 via ORAL
  Filled 2019-04-17: qty 1

## 2019-04-17 MED ORDER — HYDROCODONE-ACETAMINOPHEN 5-325 MG PO TABS: 1.0000 | ORAL_TABLET | Freq: Four times a day (QID) | ORAL | 0 refills | Status: DC | PRN

## 2019-04-17 NOTE — ED Notes (Signed)
Pt stood with no assistance needed for urine sample

## 2019-04-17 NOTE — ED Triage Notes (Addendum)
Pt fell Tuesday night. Pt c/o back pain. Pt states that he fell due to the neuropathy. Pt was going to the bathroom and he fell. Pt does not know if he hit his head but states that he did not pass out. Pt states that the TV fell on him. Pt waited to come to ER because he was burying his sister. Pt also states that this morning he felt like he was going to pass out.

## 2019-04-17 NOTE — ED Provider Notes (Signed)
[facility] Emergency Department Provider Note   ____________________________________________   First MD Initiated Contact with Patient 04/17/19 1748     (approximate)  I have reviewed the triage vital signs and the nursing notes.   HISTORY  Chief Complaint Fall and Back Pain    HPI Scott Cortez is a 85 y.o. male here for evaluation of back pain  Patient reports on Tuesday he fell while getting up from bed, hit the edge of the bed fell forward.  He did strike the side of his head on the mattress but did not lose consciousness.  Fell to the floor and a TV fell on top of his back.  He reports his mid back has been tender since that time its been causing him difficulty with walking because of pain.  He has not had any new numbness or tingling but does have chronic numbness in both feet and lower legs due to neuropathy  Is not take any medications for pain or discomfort.  No chest pain or trouble breathing.  No headaches.  No fevers or chills or recent illness.  Delayed seeking care because he had his sister's funeral to attend on Wednesday  Since then he has continued to cough tough things out with pain in his back.  No incontinence.  No abdominal pain.  No new numbness or weakness.  No muscle weakness.  No neck pain.   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   Incomplete  emptying of bladder 01/26/2014   Increased frequency of urination 01/26/2014   Slowing of urinary stream 01/26/2014    Past Surgical History:  Procedure Laterality Date   CATARACT EXTRACTION W/PHACO Left 02/02/2019   Procedure: CATARACT EXTRACTION PHACO AND INTRAOCULAR LENS PLACEMENT (IOC) LEFT ISTENT INJ AND ISTENT 01:09.1        11.3%          8.47;  Surgeon: Nevada Crane, MD;  Location: [facility];  Service: Ophthalmology;  Laterality: Left;   CATARACT EXTRACTION W/PHACO Right 02/23/2019   Procedure: CATARACT EXTRACTION PHACO AND INTRAOCULAR LENS PLACEMENT (IOC) RIGHT ISTENT INJ 01:09.1  14.1%  9.78;  Surgeon: Nevada Crane, MD;  Location: [facility];  Service: Ophthalmology;  Laterality: Right;   COLONOSCOPY N/A 10/15/2014   Procedure: COLONOSCOPY;  Surgeon: Scot Jun, MD;  Location: [facility] END[facility]COPY;  Service: Endoscopy;  Laterality: N/A;   CORONARY ANGIOPLASTY     3 stents   coronary stenting     2007 (2), 2008 (1)   ESOPHAGOGASTRODUODENOSCOPY (EGD) WITH PROPOFOL N/A 10/15/2014   Procedure: ESOPHAGOGASTRODUODENOSCOPY (EGD) WITH PROPOFOL;  Surgeon: Scot Jun, MD;  Location: [facility] ENDOSCOPY;  Service: Endoscopy;  Laterality: N/A;   INSERTION OF ANTERIOR SEGMENT AQUEOUS DRAINAGE DEVICE (ISTENT) Left 02/02/2019   Procedure: INSERTION OF ANTERIOR SEGMENT AQUEOUS DRAINAGE DEVICE (ISTENT);  Surgeon: Nevada CraneKing, Bradley Beverlie Kurihara, MD;  [facility];  Service: Ophthalmology;  Laterality: Left;   SAVORY DILATION N/A 10/15/2014   Procedure: SAVORY DILATION;  Surgeon: Scot Junobert T Elliott, MD;  Location: [facility];  Service: Endoscopy;  Laterality: N/A;    Prior to Admission medications   Medication Sig Start Date End Date Taking? Authorizing Provider  apixaban (ELIQUIS) 5 MG TABS tablet Take 1 tablet (5 mg total) by mouth 2 (two) times daily. 03/12/19   Delfino LovettShah, Vipul, MD  atorvastatin (LIPITOR) 20 MG tablet Take 20 mg by mouth daily.    [provider]  brimonidine (ALPHAGAN) 0.2 % ophthalmic solution Place 1 drop into both eyes 2 (two) times daily.    [provider]  diltiazem (CARDIZEM CD) 120 MG 24 hr capsule Take 1 capsule (120 mg total) by mouth daily. 03/12/19   Delfino LovettShah, Vipul, MD  HYDROcodone-acetaminophen (NORCO/VICODIN) 5-325 MG tablet Take 1 tablet by mouth every 6 (six) hours as needed for moderate pain or severe pain. 04/17/19   Sharyn CreamerQuale, Torrey Horseman, MD  latanoprost (XALATAN) 0.005 % ophthalmic solution Place 1 drop into both eyes every morning.    [provider]  omeprazole (PRILOSEC) 20 MG capsule Take 20 mg by mouth daily.    [provider]  ranitidine (ZANTAC) 150 MG capsule Take 150 mg by mouth daily.    [provider]  sildenafil (VIAGRA) 100 MG tablet Take 100 mg by mouth as directed. 06/16/12   [provider]  tamsulosin (FLOMAX) 0.4 MG CAPS capsule Take 0.4 mg by mouth daily.    [provider]  timolol (BETIMOL) 0.5 % ophthalmic solution Place 1 drop into both eyes 2 (two) times daily.    [provider]    Allergies Pneumococcal vaccines  History reviewed. No pertinent family history.  Social History Social History   Tobacco Use   Smoking status: Former Smoker    Quit date: 1975    Years since quitting: 45.9   Smokeless tobacco: Never Used  Substance Use Topics   Alcohol use: No   Drug use: Not Currently    Types: Marijuana    Comment: none since 2019    Review of Systems Constitutional: No fever/chills Eyes: No visual changes. ENT: No neck pain cardiovascular: Denies chest pain. Respiratory: Denies shortness of breath. Gastrointestinal: No abdominal pain.   Genitourinary: Negative for dysuria. Musculoskeletal: Pain is back mostly in his back around the area of his waistline from the fall, also a little bit in his lower back.  Pain is worse with movement standing.  Relieved by laying still and resting Skin: Negative for  rash. Neurological: Negative for headaches, areas of focal weakness or numbness.  Is compliant with his blood thinner  ____________________________________________   PHYSICAL EXAM:  VITAL SIGNS: ED Triage Vitals  Enc Vitals Group     BP 04/17/19 1556 140/67     Pulse Rate 04/17/19 1556 (!) 58     Resp 04/17/19 1556 17     Temp 04/17/19 1556 (!) 97.5 F (36.4 C)     Temp Source 04/17/19 1556 Oral     SpO2 04/17/19 1556 97 %     Weight 04/17/19 1559 175 lb (79.4 kg)     Height 04/17/19 1559 5\' 9"  (1.753 m)     Head Circumference --      Peak Flow --      Pain Score 04/17/19 1556 8     Pain Loc --  Pain Edu? --      Excl. in GC? --     Constitutional: Alert and oriented. Well appearing and in no acute distress.  Very pleasant. Eyes: Conjunctivae are normal. Head: Atraumatic. Nose: No congestion/rhinnorhea. Mouth/Throat: Mucous membranes are moist. Neck: No stridor.  Full range of motion neck without pain or discomfort.  No cervical or upper thoracic discomfort.  Does report moderate tenderness over the mid to lower thoracic spine as well as upper lumbar region with associated paraspinous muscle tenderness.  No step-offs or deformities. Cardiovascular: Normal rate, regular rhythm. Grossly normal heart sounds.  Good peripheral circulation. Respiratory: Normal respiratory effort.  No retractions. Lungs CTAB. Gastrointestinal: Soft and nontender. No distention. Musculoskeletal: No lower extremity tenderness nor edema.  5 out of 5 strength in all 4 extremities.  Some loss of sensation distally in the feet and ankle regions, patient reports chronic due to neuropathy. Neurologic:  Normal speech and language. No gross focal neurologic deficits are appreciated.  Skin:  Skin is warm, dry and intact. No rash noted. Psychiatric: Mood and affect are normal. Speech and behavior are normal.  ____________________________________________   LABS (all labs ordered are listed, but only  abnormal results are displayed)  Labs Reviewed  URINALYSIS, COMPLETE (UACMP) WITH MICROSCOPIC - Abnormal; Notable for the following components:      Result Value   Color, Urine YELLOW (*)    APPearance HAZY (*)    All other components within normal limits  URINE CULTURE  BASIC METABOLIC PANEL  CBC  CBG MONITORING, ED   ____________________________________________  EKG  Reviewed entered by me at 1605 Heart rate 60 QRS 100 QTc 430 Normal sinus rhythm, no evidence of ischemia or ectopy.  Incomplete right bundle branch block ____________________________________________  RADIOLOGY  Ct Head Wo Contrast  Result Date: 04/17/2019 CLINICAL DATA:  Fall on Tuesday EXAM: CT HEAD WITHOUT CONTRAST TECHNIQUE: Contiguous axial images were obtained from the base of the skull through the vertex without intravenous contrast. COMPARISON:  April 17, 2019 FINDINGS: Brain: No evidence of acute territorial infarction, hemorrhage, hydrocephalus,extra-axial collection or mass lesion/mass effect. There is dilatation the ventricles and sulci consistent with age-related atrophy. Low-attenuation changes in the deep white matter consistent with small vessel ischemia. Vascular: No hyperdense vessel or unexpected calcification. Skull: The skull is intact. No fracture or focal lesion identified. Sinuses/Orbits: The visualized paranasal sinuses and mastoid air cells are clear. The orbits and globes intact. Other: None IMPRESSION: No acute intracranial abnormality. Findings consistent with age related atrophy and chronic small vessel ischemia Electronically Signed   By: Jonna Clark M.D.   On: 04/17/2019 19:14   Ct Thoracic Spine Wo Contrast  Result Date: 04/17/2019 CLINICAL DATA:  Back pain, minor trauma. Additional history provided: Patient fell Tuesday, reports back pain. EXAM: CT THORACIC SPINE WITHOUT CONTRAST TECHNIQUE: Multidetector CT images of the thoracic were obtained using the standard protocol without  intravenous contrast. COMPARISON:  Chest radiograph 02/28/2018, chest CT 01/09/2016 FINDINGS: Alignment: No significant spondylolisthesis. Vertebrae: Vertebral body height is maintained. No evidence of acute fracture to the thoracic spine. Paraspinal and other soft tissues: Paraspinal soft tissues within normal limits. Aortic atherosclerosis. Paraseptal emphysema. Disc levels: Thoracic spondylosis with levels of mild and moderate disc degeneration greatest within the midthoracic spine. Prominent multilevel ventrolateral osteophytes, particularly at T8-T9 and T9-T10. No high-grade bony spinal canal or neural foraminal narrowing at any level. IMPRESSION: No evidence of acute fracture to the thoracic spine. Thoracic spondylosis. Prominent ventrolateral osteophytes at T8-T9 and T9-T10. Aortic  atherosclerosis. Paraseptal emphysema within the partially imaged lungs. Electronically Signed   By: Kellie Simmering DO   On: 04/17/2019 19:06   Ct Lumbar Spine Wo Contrast  Result Date: 04/17/2019 CLINICAL DATA:  Back trauma after fall EXAM: CT LUMBAR SPINE WITHOUT CONTRAST TECHNIQUE: Multidetector CT imaging of the lumbar spine was performed without intravenous contrast administration. Multiplanar CT image reconstructions were also generated. COMPARISON:  March 09, 2019 MRI FINDINGS: Segmentation: There are 5 non-rib bearing lumbar type vertebral bodies with the last intervertebral disc space labeled as L5-S1. Alignment: Normal Vertebrae: The vertebral body heights are well maintained. No fracture, malalignment, or pathologic osseous lesions seen. Paraspinal and other soft tissues: The paraspinal soft tissues and visualized retroperitoneal structures are unremarkable. The sacroiliac joints are intact. Dense aortic atherosclerosis is noted. Disc levels: Lumbar spine spondylosis is seen with disc height loss, anterior osteophytes, and facet arthropathy this is most notable at L4-L5 and L5-S1 with neural foraminal and canal  narrowing. IMPRESSION: 1. No acute fracture or malalignment. 2. Lumbar spine spondylosis most notable at L4-L5 and L5-S1. 3.  Aortic Atherosclerosis (ICD10-I70.0). Electronically Signed   By: Prudencio Pair M.D.   On: 04/17/2019 19:17     Imaging studies reviewed negative for acute fractures. ____________________________________________   PROCEDURES  Procedure(s) performed: None  Procedures  Critical Care performed: No  ____________________________________________   INITIAL IMPRESSION / ASSESSMENT AND PLAN / ED COURSE  Pertinent labs & imaging results that were available during my care of the patient were reviewed by me and considered in my medical decision making (see chart for details).   Patient doing for back pain after a fall.  Lab work and urinalysis reassuring.  No symptoms of cauda equina or spinal cord compromise.  To the mechanism will follow with CT scanning to evaluate for traumatic injury to the spine.  No evidence of cervical injury.  Is on anticoagulant so head CT is ordered though he shows no obvious evidence of head trauma but does report striking his head on the side of the bed or mattress  Awake alert reassuring exam will provide pain medication for relief as well  ----------------------------------------- 8:57 PM on 04/17/2019 -----------------------------------------  Imaging studies reassuring.  Patient resting comfortably.  Discussed with patient, he is agreeable with plan to follow-up with his doctor at [facility] clinic, brief use of hydrocodone which we discussed safe use of, and also consideration for referral to orthopedics if symptoms persist.  Fully alert and oriented, reports moderate relief of pain with hydrocodone, is fully alert and oriented will provide 1 additional hydrocodone for pain relief this evening and he will pick up prescription from pharmacy      ____________________________________________   FINAL CLINICAL IMPRESSION(S) / ED  DIAGNOSES  Final diagnoses:  Back strain, initial encounter        Note:  This document was prepared using Dragon voice recognition software and may include unintentional dictation errors    Scott Cortez was evaluated in Emergency Department on 04/17/2019 for the symptoms described in the history of present illness. He was evaluated in the context of the global COVID-19 pandemic, which necessitated consideration that the patient might be at risk for infection with the SARS-CoV-2 virus that causes COVID-19. Institutional protocols and algorithms that pertain to the evaluation of patients at risk for COVID-19 are in a state of rapid change based on information released by regulatory bodies including the CDC and federal and state organizations. These policies and algorithms were followed during the patient's care  in the ED.      Sharyn Creamer, MD 04/17/19 [phone]

## 2019-04-17 NOTE — ED Notes (Signed)
Pt given phone to call a ride 

## 2019-04-19 LAB — URINE CULTURE
Culture: NO GROWTH
Special Requests: NORMAL

## 2019-04-20 ENCOUNTER — Other Ambulatory Visit: Payer: Self-pay

## 2019-04-20 ENCOUNTER — Encounter: Payer: Self-pay | Admitting: Nurse Practitioner

## 2019-04-20 ENCOUNTER — Other Ambulatory Visit: Payer: BLUE CROSS/BLUE SHIELD | Admitting: Nurse Practitioner

## 2019-04-20 DIAGNOSIS — Z515 Encounter for palliative care: Secondary | ICD-10-CM

## 2019-04-20 DIAGNOSIS — R270 Ataxia, unspecified: Secondary | ICD-10-CM

## 2019-04-20 NOTE — Progress Notes (Signed)
[facility] Consult Note Telephone: [phone]  Fax: [phone]  PATIENT NAME: Scott Cortez DOB: 12/08/1933 MRN: [id number]  PRIMARY CARE PROVIDER:   [facility]  REFERRING PROVIDER:  [facility], [address] [place],  [place] [address]  RESPONSIBLE PARTY:   Self Due to the COVID-19 crisis, this visit was done via telemedicine from my office and it was initiated and consent by this patient and or family.  I was asked by Dr Lennox Grumbles at [facility] to see Scott Cortez for Palliative care consult for goals of care  RECOMMENDATIONS and PLAN:  1. ACP: full code, will revisit at next pc visit, very difficulty to discuss on phone with Scott Cortez being hard of hearing  2. Palliative care encounter Palliative medicine team will continue to support patient, patient's family, and medical team. Visit consisted of counseling and education dealing with the complex and emotionally intense issues of symptom management and palliative care in the setting of serious and potentially life-threatening illness  I spent 65 minutes providing this consultation,  from 3:00pm to 4:05pm. More than 50% of the time in this consultation was spent coordinating communication.   HISTORY OF PRESENT ILLNESS:  Scott Cortez is a 85 y.o. 85 y.o. year old male with multiple medical problems including Coronary artery disease s/p stent, congestive heart failure, myocardial infarction, hypertension, hyperlipidemia, gerd, glaucoma, neuropathy, BPH,  erectile dysfunction, history of bladder neck obstruction, cataract extraction with phaco. Scott Cortez was seeing 11/18/2018 Primary Care visit per documentation he was having poor balance using a cane with some falls at that time. Hospitalization 10 / 24 / 2020 to 10 / 29 / 2020 for proximal atrial fibrillation with intermittent episodes of bradycardia. Norvasc discontinued and started on  cardizem for heart rate. Cardiology started Eliquis and stopped Plavix and aspirin. Near syncope unsteady with Romberg impaired left finger. MRI brain with no acute findings. Recommended discharged Home Health with physical therapy. Bilateral lower extremity weakness and decreased Mobility probably from peripheral neuropathy. MRI of lumbar spine reveals degenerative disorder with foraminal stenosis but no spinal stenosis with follow-up for neurology for possible nerve conduction velocities. He was saying in the emergency department 12 / 4 / 2020 when he fell while getting up from the bed. He struck the side of his head on the mattress but did not lose consciousness. Work up including cats cans unremarkable. He was provided a hydrocodone for pain with final diagnosis back strain. He has been followed by [facility] for BPH. I called Scott Cortez for scheduled palliative care initial visit telemedicine telephonic. I explained purpose for palliative care visit and he verbalized understanding and was agreeable. We talked about past medical history with recent falls and unsteady gait, ataxia. He talked at length about having difficulty with his balance. Functional abilities. [facility] neurology appointment on December 17th. We talked about recent hospitalization for which Scott Cortez endorses he was seen by neurology, felt like edema was caused by neuropathy. We talked about home health with physical therapy for which mr. Balis endorses has improved his gait. He felt like this is help tremendously. Scott Cortez talked about therapy not being completed as of yet as he had to take a few weeks off for the death of his sister. We talked about his functional abilities. He does walk with a walker now. Scott Cortez was going to recontact [facility] to  reschedule the missed physical  therapy appointment. Scott Cortez had a very difficult time talking on the phone, understanding words and having to spell them out  throughout the conversation. We talked about medical goals and he is looking forward to meeting with a neurologist to see why his balance is so off. Scott Cortez endorses he does have strength in his legs that he feels his good. We talked about scheduling a follow-up palliative care visit after he meets with Neurology for further discussion of goals of care. Scott Cortez in agreement and at that time will be in person and further discuss code status what should be easier for Scott Cortez to understand. Therapeutic listening and emotional support provided. Questions answered to satisfaction. Contact information provided. Palliative Care was asked to help address goals of care.   CODE STATUS: Full code  PPS: 50% HOSPICE ELIGIBILITY/DIAGNOSIS: TBD  PAST MEDICAL HISTORY:  Past Medical History:  Diagnosis Date  . Bladder neck obstruction   . Bradycardia   . CHF (congestive heart failure) (HCC)   . Chronic pancreatitis (HCC)   . Coronary artery disease   . Dental bridge present    upper - bilateral  . GERD (gastroesophageal reflux disease)   . Glaucoma   . Hyperlipidemia   . Hypertension   . Myocardial infarction (HCC)   . Neuropathy    feet    SOCIAL HX:  Social History   Tobacco Use  . Smoking status: Former Smoker    Quit date: 1975    Years since quitting: 45.9  . Smokeless tobacco: Never Used  Substance Use Topics  . Alcohol use: No    ALLERGIES:  Allergies  Allergen Reactions  . Pneumococcal Vaccines Swelling     PERTINENT MEDICATIONS:  Outpatient Encounter Medications 83 as of 04/20/2019  Medication Sig  . apixaban (ELIQUIS) 5 MG TABS tablet Take 1 tablet (5 mg total) by mouth 2 (two) times daily.  Marland Kitchen atorvastatin (LIPITOR) 20 MG tablet Take 20 mg by mouth daily.  . brimonidine (ALPHAGAN) 0.2 % ophthalmic solution Place 1 drop into both eyes 2 (two) times daily.  Marland Kitchen diltiazem (CARDIZEM CD) 120 MG 24 hr capsule Take 1 capsule (120 mg total) by mouth daily.  Marland Kitchen  HYDROcodone-acetaminophen (NORCO/VICODIN) 5-325 MG tablet Take 1 tablet by mouth every 6 (six) hours as needed for moderate pain or severe pain.  Marland Kitchen latanoprost (XALATAN) 0.005 % ophthalmic solution Place 1 drop into both eyes every morning.  Marland Kitchen omeprazole (PRILOSEC) 20 MG capsule Take 20 mg by mouth daily.  . ranitidine (ZANTAC) 150 MG capsule Take 150 mg by mouth daily.  . sildenafil (VIAGRA) 100 MG tablet Take 100 mg by mouth as directed.  . tamsulosin (FLOMAX) 0.4 MG CAPS capsule Take 0.4 mg by mouth daily.  . timolol (BETIMOL) 0.5 % ophthalmic solution Place 1 drop into both eyes 2 (two) times daily.   No facility-administered encounter medications on file 83 as of 04/20/2019.     PHYSICAL EXAM:  Deferred  Quinette Hentges Z Menelik Mcfarren, NP

## 2019-04-30 ENCOUNTER — Ambulatory Visit: Payer: Medicare Other | Admitting: Urology

## 2019-05-01 ENCOUNTER — Ambulatory Visit: Payer: Medicare Other | Admitting: Urology

## 2019-05-27 ENCOUNTER — Ambulatory Visit: Payer: Medicare Other | Admitting: Urology

## 2019-06-02 ENCOUNTER — Other Ambulatory Visit: Payer: Self-pay

## 2019-06-02 ENCOUNTER — Other Ambulatory Visit: Payer: Medicare Other | Admitting: Nurse Practitioner

## 2019-06-02 ENCOUNTER — Telehealth: Payer: Self-pay | Admitting: Nurse Practitioner

## 2019-06-02 NOTE — Telephone Encounter (Signed)
I called Mr. Baty for scheduled telemedicine follow-up PC visit, no answer, message left with contact information

## 2019-06-04 ENCOUNTER — Telehealth: Payer: Self-pay | Admitting: Nurse Practitioner

## 2019-06-04 NOTE — Telephone Encounter (Signed)
Spoke with patient and have rescheduled the 06/02/19 Palliative f/u visit to 06/18/19 @ 3 PM

## 2019-06-18 ENCOUNTER — Other Ambulatory Visit: Payer: Medicare Other | Admitting: Nurse Practitioner

## 2019-06-18 ENCOUNTER — Ambulatory Visit (INDEPENDENT_AMBULATORY_CARE_PROVIDER_SITE_OTHER): Payer: Medicare Other | Admitting: Urology

## 2019-06-18 ENCOUNTER — Encounter: Payer: Self-pay | Admitting: Urology

## 2019-06-18 ENCOUNTER — Telehealth: Payer: Self-pay | Admitting: Nurse Practitioner

## 2019-06-18 ENCOUNTER — Other Ambulatory Visit: Payer: Self-pay

## 2019-06-18 VITALS — BP 132/72 | HR 70 | Ht 69.0 in | Wt 174.0 lb

## 2019-06-18 DIAGNOSIS — N138 Other obstructive and reflux uropathy: Secondary | ICD-10-CM

## 2019-06-18 DIAGNOSIS — N401 Enlarged prostate with lower urinary tract symptoms: Secondary | ICD-10-CM

## 2019-06-18 LAB — BLADDER SCAN AMB NON-IMAGING: SCA Result: 52

## 2019-06-18 MED ORDER — TAMSULOSIN HCL 0.4 MG PO CAPS: 0.4000 mg | ORAL_CAPSULE | Freq: Every day | ORAL | 11 refills | Status: DC

## 2019-06-18 NOTE — Progress Notes (Signed)
06/18/2019 2:13 PM   Scott Cortez June 01, 1933 [id number]  Referring provider: Center, [facility] [name],  [place] [address]  Chief Complaint  Patient presents with  . Benign Prostatic Hypertrophy    HPI: 86 y.o. male followed for lower urinary tract symptoms.  Urologic summary:  -Initially seen [facility] 2015, moderate to severe LUTS, PVR 102 mL; started tamsulosin -Follow-up PVR 57 mL with improvement in LUTS on tamsulosin -Tamsulosin increased 0.8 mg 06/2016 for increased urgency, nocturia -05/2018 worsening voiding symptoms with frequency, intermittent urinary stream and nocturia 2-4 -Cystoscopy 2/20 minimal lateral lobe enlargement, normal bladder neck; TRUS 25 cc prostate volume; Myrbetriq started  [name] presents for follow-up.  He had a telehealth visit with [facility] June 2020 and was to continue on Myrbetriq 50 mg.  His tamsulosin was decreased to 0.4 mg.  He states he stopped tamsulosin several months ago and remains on tamsulosin.  His nocturia is stable at 2-3 times per night.  IPSS completed today was 6/35 with a QoL rated 2/6.  Denies dysuria, gross hematuria or flank, abdominal, pelvic pain.   PMH: Past Medical History:  Diagnosis Date  . Bladder neck obstruction   . Bradycardia   . CHF (congestive heart failure) ([name])   . Chronic pancreatitis ([name])   . Coronary artery disease   . Dental bridge present    upper - bilateral  . GERD (gastroesophageal reflux disease)   . Glaucoma   . Hyperlipidemia   . Hypertension   . Myocardial infarction ([name])   . Neuropathy    feet    Surgical History: Past Surgical History:  Procedure Laterality Date  . CATARACT EXTRACTION W/PHACO Left 02/02/2019   Procedure: CATARACT EXTRACTION PHACO AND INTRAOCULAR LENS PLACEMENT (IOC) LEFT ISTENT INJ AND ISTENT 01:09.1        11.3%          8.47;  Surgeon: [name], MD;  Location: [place];  Service: Ophthalmology;  Laterality:  Left;  . CATARACT EXTRACTION W/PHACO Right 02/23/2019   Procedure: CATARACT EXTRACTION PHACO AND INTRAOCULAR LENS PLACEMENT (IOC) RIGHT ISTENT INJ 01:09.1  14.1%  9.78;  Surgeon: [name], MD;  Location: [place];  Service: Ophthalmology;  Laterality: Right;  . COLONOSCOPY N/A 10/15/2014   Procedure: COLONOSCOPY;  Surgeon: [name], MD;  Location: [facility] ENDOSCOPY;  Service: Endoscopy;  Laterality: N/A;  . CORONARY ANGIOPLASTY     3 stents  . coronary stenting     2007 (2), 2008 (1)  . ESOPHAGOGASTRODUODENOSCOPY (EGD) WITH PROPOFOL N/A 10/15/2014   Procedure: ESOPHAGOGASTRODUODENOSCOPY (EGD) WITH PROPOFOL;  Surgeon: [name], MD;  Location: [facility] ENDOSCOPY;  Service: Endoscopy;  Laterality: N/A;  . INSERTION OF ANTERIOR SEGMENT AQUEOUS DRAINAGE DEVICE (ISTENT) Left 02/02/2019   Procedure: INSERTION OF ANTERIOR SEGMENT AQUEOUS DRAINAGE DEVICE (ISTENT);  Surgeon: [name], MD;  Location: [place];  Service: Ophthalmology;  Laterality: Left;  . SAVORY DILATION N/A 10/15/2014   Procedure: SAVORY DILATION;  Surgeon: [name], MD;  Location: [facility] ENDOSCOPY;  Service: Endoscopy;  Laterality: N/A;    Home Medications:  Allergies as of 06/18/2019      Reactions   Pneumococcal Vaccines Swelling      Medication List       Accurate as of June 18, 2019  2:13 PM. If you have any questions, ask your nurse or doctor.        apixaban 5 MG Tabs tablet Commonly known  asEverlene Balls Take 1 tablet (5 mg total) by mouth 2 (two) times daily.   atorvastatin 20 MG tablet Commonly known as: LIPITOR Take 20 mg by mouth daily.   brimonidine 0.2 % ophthalmic solution Commonly known as: ALPHAGAN Place 1 drop into both eyes 2 (two) times daily.   diltiazem 120 MG 24 hr capsule Commonly known as: CARDIZEM CD Take 1 capsule (120 mg total) by mouth daily.   HYDROcodone-acetaminophen 5-325 MG tablet Commonly known as: NORCO/VICODIN Take 1 tablet by  mouth every 6 (six) hours as needed for moderate pain or severe pain.   latanoprost 0.005 % ophthalmic solution Commonly known as: XALATAN Place 1 drop into both eyes every morning.   omeprazole 20 MG capsule Commonly known as: PRILOSEC Take 20 mg by mouth daily.   ranitidine 150 MG capsule Commonly known as: ZANTAC Take 150 mg by mouth daily.   sildenafil 100 MG tablet Commonly known as: VIAGRA Take 100 mg by mouth as directed.   tamsulosin 0.4 MG Caps capsule Commonly known as: FLOMAX Take 0.4 mg by mouth daily.   timolol 0.5 % ophthalmic solution Commonly known as: BETIMOL Place 1 drop into both eyes 2 (two) times daily.       Allergies:  Allergies  Allergen Reactions  . Pneumococcal Vaccines Swelling    Family History: No family history on file.  Social History:  reports that he quit smoking about 46 years ago. He has never used smokeless tobacco. He reports previous drug use. Drug: Marijuana. He reports that he does not drink alcohol.  ROS: UROLOGY Frequent Urination?: No Hard to postpone urination?: No Burning/pain with urination?: No Get up at night to urinate?: Yes Leakage of urine?: Yes Urine stream starts and stops?: No Trouble starting stream?: No Do you have to strain to urinate?: No Blood in urine?: No Urinary tract infection?: No Sexually transmitted disease?: No Injury to kidneys or bladder?: No Painful intercourse?: No Weak stream?: No Erection problems?: No Penile pain?: No  Gastrointestinal Nausea?: No Vomiting?: No Indigestion/heartburn?: No Diarrhea?: No Constipation?: No  Constitutional Fever: No Night sweats?: No Weight loss?: No Fatigue?: No  Skin Skin rash/lesions?: No Itching?: No  Eyes Blurred vision?: Yes Double vision?: No  Ears/Nose/Throat Sore throat?: No Sinus problems?: No  Hematologic/Lymphatic Swollen glands?: No Easy bruising?: No  Cardiovascular Leg swelling?: No Chest pain?:  No  Respiratory Cough?: No Shortness of breath?: No  Endocrine Excessive thirst?: No  Musculoskeletal Back pain?: No Joint pain?: No  Neurological Headaches?: No Dizziness?: No  Psychologic Depression?: No Anxiety?: No  Physical Exam: BP 132/72   Pulse 70   Ht 5\' 9"  (1.753 m)   Wt 174 lb (78.9 kg)   BMI 25.70 kg/m   Constitutional:  Alert and oriented, No acute distress. HEENT: [place] AT, moist mucus membranes.  Trachea midline, no masses. Cardiovascular: No clubbing, cyanosis, or edema. Respiratory: Normal respiratory effort, no increased work of breathing. Skin: No rashes, bruises or suspicious lesions. Neurologic: Grossly intact, no focal deficits, moving all 4 extremities. Psychiatric: Normal mood and affect.  Assessment & Plan:   Stable lower urinary tract symptoms which are not bothersome.  Tamsulosin was refilled.  PVR by bladder scan today was 52 cc  Continue annual follow-up or call as needed for worsening symptoms.   , MD  [facility] [address] [place], [place] [id number]

## 2019-06-18 NOTE — Telephone Encounter (Signed)
Called patient to reschedule today's Palliative f/u visit, no answer - left message with reason for call along with my contact number. 

## 2019-06-21 ENCOUNTER — Encounter: Payer: Self-pay | Admitting: Urology

## 2019-06-29 ENCOUNTER — Telehealth: Payer: Self-pay | Admitting: Nurse Practitioner

## 2019-06-29 NOTE — Telephone Encounter (Signed)
Called patient to reschedule the 06/18/19 Palliative f/u visit, no answer - left message with my contact information.

## 2019-06-30 ENCOUNTER — Telehealth: Payer: Self-pay | Admitting: Nurse Practitioner

## 2019-06-30 NOTE — Telephone Encounter (Signed)
I called Scott Cortez to reschedule PC f/u visit, no answer, message left with contact information

## 2019-07-30 ENCOUNTER — Other Ambulatory Visit: Payer: Medicare Other | Admitting: Nurse Practitioner

## 2019-07-30 ENCOUNTER — Other Ambulatory Visit: Payer: Self-pay

## 2019-07-30 ENCOUNTER — Telehealth: Payer: Self-pay | Admitting: Nurse Practitioner

## 2019-07-30 NOTE — Telephone Encounter (Signed)
I called Mr Scott Cortez for scheduled telemedicine f/u pc visit, no answer, message left with contact information

## 2019-08-01 ENCOUNTER — Emergency Department
Admission: EM | Admit: 2019-08-01 | Discharge: 2019-08-01 | Disposition: A | Discharge status: Home / Self Care | Payer: Medicare Other | Attending: Emergency Medicine | Admitting: Emergency Medicine

## 2019-08-01 ENCOUNTER — Other Ambulatory Visit: Payer: Self-pay

## 2019-08-01 ENCOUNTER — Emergency Department: Payer: Medicare Other

## 2019-08-01 DIAGNOSIS — Z7729 Contact with and (suspected ) exposure to other hazardous substances: Secondary | ICD-10-CM

## 2019-08-01 DIAGNOSIS — I2510 Atherosclerotic heart disease of native coronary artery without angina pectoris: Secondary | ICD-10-CM | POA: Diagnosis not present

## 2019-08-01 DIAGNOSIS — I509 Heart failure, unspecified: Secondary | ICD-10-CM | POA: Diagnosis not present

## 2019-08-01 DIAGNOSIS — R0602 Shortness of breath: Secondary | ICD-10-CM | POA: Diagnosis present

## 2019-08-01 DIAGNOSIS — Z7901 Long term (current) use of anticoagulants: Secondary | ICD-10-CM | POA: Diagnosis not present

## 2019-08-01 DIAGNOSIS — Z9861 Coronary angioplasty status: Secondary | ICD-10-CM | POA: Insufficient documentation

## 2019-08-01 DIAGNOSIS — Z79899 Other long term (current) drug therapy: Secondary | ICD-10-CM | POA: Insufficient documentation

## 2019-08-01 DIAGNOSIS — I252 Old myocardial infarction: Secondary | ICD-10-CM | POA: Insufficient documentation

## 2019-08-01 DIAGNOSIS — I110 Hypertensive heart disease with heart failure: Secondary | ICD-10-CM | POA: Insufficient documentation

## 2019-08-01 DIAGNOSIS — Z87891 Personal history of nicotine dependence: Secondary | ICD-10-CM | POA: Diagnosis not present

## 2019-08-01 LAB — CBC WITH DIFFERENTIAL/PLATELET
Abs Immature Granulocytes: 0.01 10*3/uL (ref 0.00–0.07)
Basophils Absolute: 0 10*3/uL (ref 0.0–0.1)
Basophils Relative: 1 %
Eosinophils Absolute: 0.2 10*3/uL (ref 0.0–0.5)
Eosinophils Relative: 5 %
HCT: 41.9 % (ref 39.0–52.0)
Hemoglobin: 14.1 g/dL (ref 13.0–17.0)
Immature Granulocytes: 0 %
Lymphocytes Relative: 17 %
Lymphs Abs: 0.8 10*3/uL (ref 0.7–4.0)
MCH: 30.8 pg (ref 26.0–34.0)
MCHC: 33.7 g/dL (ref 30.0–36.0)
MCV: 91.5 fL (ref 80.0–100.0)
Monocytes Absolute: 0.6 10*3/uL (ref 0.1–1.0)
Monocytes Relative: 13 %
Neutro Abs: 2.9 10*3/uL (ref 1.7–7.7)
Neutrophils Relative %: 64 %
Platelets: 196 10*3/uL (ref 150–400)
RBC: 4.58 MIL/uL (ref 4.22–5.81)
RDW: 13.2 % (ref 11.5–15.5)
WBC: 4.6 10*3/uL (ref 4.0–10.5)
nRBC: 0 % (ref 0.0–0.2)

## 2019-08-01 LAB — COMPREHENSIVE METABOLIC PANEL
ALT: 17 U/L (ref 0–44)
AST: 19 U/L (ref 15–41)
Albumin: 4.1 g/dL (ref 3.5–5.0)
Alkaline Phosphatase: 97 U/L (ref 38–126)
Anion gap: 11 (ref 5–15)
BUN: 18 mg/dL (ref 8–23)
CO2: 23 mmol/L (ref 22–32)
Calcium: 9.3 mg/dL (ref 8.9–10.3)
Chloride: 104 mmol/L (ref 98–111)
Creatinine, Ser: 0.83 mg/dL (ref 0.61–1.24)
GFR calc Af Amer: >60 mL/min (ref >60)
GFR calc non Af Amer: >60 mL/min (ref >60)
Glucose, Bld: 120 mg/dL — ABNORMAL HIGH (ref 70–99)
Potassium: 3.5 mmol/L (ref 3.5–5.1)
Sodium: 138 mmol/L (ref 135–145)
Total Bilirubin: 2.3 mg/dL — ABNORMAL HIGH (ref 0.3–1.2)
Total Protein: 7.1 g/dL (ref 6.5–8.1)

## 2019-08-01 LAB — BLOOD GAS, ARTERIAL
Acid-Base Excess: 2.8 mmol/L — ABNORMAL HIGH (ref 0.0–2.0)
Bicarbonate: 27.2 mmol/L (ref 20.0–28.0)
FIO2: 0.21
O2 Saturation: 98.3 %
pCO2 arterial: 40 mmHg (ref 32.0–48.0)
pH, Arterial: 7.44 (ref 7.350–7.450)
pO2, Arterial: 90 mmHg (ref 83.0–108.0)

## 2019-08-01 LAB — COOXEMETRY PANEL
Carboxyhemoglobin: 3.1 % — ABNORMAL HIGH (ref 0.5–1.5)
Methemoglobin: 0.9 % (ref 0.0–1.5)
O2 Saturation: 97.3 %
Total oxygen content: 94.4 mL/dL

## 2019-08-01 LAB — TROPONIN I (HIGH SENSITIVITY): Troponin I (High Sensitivity): 13 ng/L (ref <18)

## 2019-08-01 NOTE — ED Triage Notes (Signed)
Per ems pt was exposed to "fumes" in house today. Pt complains of shob. Per ems CO level zero, co2 36, pox 97% on ra. Pt states shob continues but is improving. Pt denies pain.

## 2019-08-01 NOTE — ED Provider Notes (Signed)
-----------------------------------------   8:26 AM on 08/01/2019 -----------------------------------------  Patient is feeling well.  We will take off oxygen.  Patient nephew states the fire department has cleared the house and states it is safe to go back in the house.  Patient states he will probably stay with a family member today to let the house air out longer.  Patient states he is already called someone to come fix his furnace.  We will discharge the patient from the emergency department once family arrives.   Minna Antis, MD 08/01/19 [phone]

## 2019-08-01 NOTE — ED Notes (Signed)
ED Provider at bedside. 

## 2019-08-01 NOTE — ED Notes (Signed)
Pt placed on non rebreather at 15lpm for carboxyhemoglobin of 3.1, md aware.

## 2019-08-01 NOTE — ED Provider Notes (Signed)
[facility] Emergency Department Provider Note  ____________________________________________   First MD Initiated Contact with Patient 08/01/19 [phone]     (approximate)  I have reviewed the triage vital signs and the nursing notes.   HISTORY  Chief Complaint Shortness of Breath    HPI Scott Cortez is a 86 y.o. male with history of CHF, prior PEs on Eliquis, paroxysmal A. fib who comes in for shortness of breath.  Patient states that he went out and he has will use for his fumes and that he woke up to there being a cloudiness in the room and a smell of gas.  He felt short of breath that was constant, moderate, better once he got out of the house, worse while in the house.  Patient states that his shortness of breath is mostly gone at this time.  He states that he cannot return back to this house at this time.  He is working on finding a family member to go stay with.  He denies any chest pain.  No worsening leg swelling.          Past Medical History:  Diagnosis Date  . Bladder neck obstruction   . Bradycardia   . CHF (congestive heart failure) ([facility])   . Chronic pancreatitis ([facility])   . Coronary artery disease   . Dental bridge present    upper - bilateral  . GERD (gastroesophageal reflux disease)   . Glaucoma   . Hyperlipidemia   . Hypertension   . Myocardial infarction ([facility])   . Neuropathy    feet    Patient Active Problem List   Diagnosis Date Noted  . Near syncope 03/07/2019  . Nocturia 06/26/2016  . Lower abdominal pain 09/04/2015  . Erectile dysfunction 08/30/2014  . 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 Incomplete emptying of bladder 01/26/2014  . Increased frequency of urination 01/26/2014  . Slowing of urinary stream 01/26/2014    Past Surgical History:  Procedure Laterality Date  . CATARACT EXTRACTION W/PHACO Left 02/02/2019   Procedure: CATARACT EXTRACTION PHACO AND INTRAOCULAR LENS PLACEMENT (IOC)  LEFT ISTENT INJ AND ISTENT 01:09.1        11.3%          8.47;  Surgeon: Nevada Crane, MD;  Location: [facility];  Service: Ophthalmology;  Laterality: Left;  . CATARACT EXTRACTION W/PHACO Right 02/23/2019   Procedure: CATARACT EXTRACTION PHACO AND INTRAOCULAR LENS PLACEMENT (IOC) RIGHT ISTENT INJ 01:09.1  14.1%  9.78;  Surgeon: Nevada Crane, MD;  Location: [facility];  Service: Ophthalmology;  Laterality: Right;  . COLONOSCOPY N/A 10/15/2014   Procedure: COLONOSCOPY;  Surgeon: Scot Jun, MD;  Location: [facility] ENDOSCOPY;  Service: Endoscopy;  Laterality: N/A;  . CORONARY ANGIOPLASTY     3 stents  . coronary stenting     2007 (2), 2008 (1)  . ESOPHAGOGASTRODUODENOSCOPY (EGD) WITH PROPOFOL N/A 10/15/2014   Procedure: ESOPHAGOGASTRODUODENOSCOPY (EGD) WITH PROPOFOL;  Surgeon: Scot Jun, MD;  Location: [facility] ENDOSCOPY;  Service: Endoscopy;  Laterality: N/A;  . INSERTION OF ANTERIOR SEGMENT AQUEOUS DRAINAGE DEVICE (ISTENT) Left 02/02/2019   Procedure: INSERTION OF ANTERIOR SEGMENT AQUEOUS DRAINAGE DEVICE (ISTENT);  Surgeon: Nevada Crane, MD;  Location: [facility];  Service: Ophthalmology;  Laterality: Left;  . SAVORY DILATION N/A 10/15/2014   Procedure: SAVORY DILATION;  Surgeon: Scot Jun, MD;  Location: [facility] ENDOSCOPY;  Service: Endoscopy;  Laterality: N/A;    Prior to Admission medications  Medication Sig Start Date End Date Taking? Authorizing Provider  apixaban (ELIQUIS) 5 MG TABS tablet Take 1 tablet (5 mg total) by mouth 2 (two) times daily. 03/12/19   Max Sane, MD  atorvastatin (LIPITOR) 20 MG tablet Take 20 mg by mouth daily.    [provider]  brimonidine (ALPHAGAN) 0.2 % ophthalmic solution Place 1 drop into both eyes 2 (two) times daily.    [provider]  diltiazem (CARDIZEM CD) 120 MG 24 hr capsule Take 1 capsule (120 mg total) by mouth daily. 03/12/19   Max Sane, MD  HYDROcodone-acetaminophen  (NORCO/VICODIN) 5-325 MG tablet Take 1 tablet by mouth every 6 (six) hours as needed for moderate pain or severe pain. 04/17/19   Delman Kitten, MD  latanoprost (XALATAN) 0.005 % ophthalmic solution Place 1 drop into both eyes every morning.    [provider]  omeprazole (PRILOSEC) 20 MG capsule Take 20 mg by mouth daily.    [provider]  ranitidine (ZANTAC) 150 MG capsule Take 150 mg by mouth daily.    [provider]  sildenafil (VIAGRA) 100 MG tablet Take 100 mg by mouth as directed. 06/16/12   [provider]  tamsulosin (FLOMAX) 0.4 MG CAPS capsule Take 1 capsule (0.4 mg total) by mouth daily. 06/18/19   Stoioff, Ronda Fairly, MD  timolol (BETIMOL) 0.5 % ophthalmic solution Place 1 drop into both eyes 2 (two) times daily.    [provider]    Allergies Pneumococcal vaccines  No family history on file.  Social History Social History   Tobacco Use  . Smoking status: Former Smoker    Quit date: 1975    Years since quitting: 46.2  . Smokeless tobacco: Never Used  Substance Use Topics  . Alcohol use: No  . Drug use: Not Currently    Types: Marijuana    Comment: none since 2019      Review of Systems Constitutional: No fever/chills Eyes: No visual changes. ENT: No sore throat. Cardiovascular: No chest pain Respiratory: Positive for SOB Gastrointestinal: No abdominal pain.  No nausea, no vomiting.  No diarrhea.  No constipation. Genitourinary: Negative for dysuria. Musculoskeletal: Negative for back pain. Skin: Negative for rash. Neurological: Negative for headaches, focal weakness or numbness. All other ROS negative ____________________________________________   PHYSICAL EXAM:  VITAL SIGNS: ED Triage Vitals  Enc Vitals Group     BP 08/01/19 0508 121/89     Pulse Rate 08/01/19 0508 (!) 112     Resp 08/01/19 0508 (!) 22     Temp 08/01/19 0508 98 F (36.7 C)     Temp Source 08/01/19 0508 Oral     SpO2 08/01/19 0508 96 %      Weight 08/01/19 0509 174 lb (78.9 kg)     Height 08/01/19 0509 5\' 9"  (1.753 m)     Head Circumference --      Peak Flow --      Pain Score 08/01/19 0509 0     Pain Loc --      Pain Edu? --      Excl. in [place]? --     Constitutional: Alert and oriented. Well appearing and in no acute distress. Eyes: Conjunctivae are normal. EOMI. Head: Atraumatic. Nose: No congestion/rhinnorhea. Mouth/Throat: Mucous membranes are moist.   Neck: No stridor. Trachea Midline. FROM Cardiovascular: Initially tachycardic in triage but this was secondary to an irregular rhythm and patient was sinus upon reassessment,  Grossly normal heart sounds.  Good peripheral circulation.  Respiratory:  Gastrointestinal: Soft and nontender. No distention. No abdominal bruits.  Musculoskeletal: No lower extremity tenderness nor edema.  No joint effusions. Neurologic:  Normal speech and language. No gross focal neurologic deficits are appreciated.  Skin:  Skin is warm, dry and intact. No rash noted. Psychiatric: Mood and affect are normal. Speech and behavior are normal. GU: Deferred   ____________________________________________   LABS (all labs ordered are listed, but only abnormal results are displayed)  Labs Reviewed  COMPREHENSIVE METABOLIC PANEL - Abnormal; Notable for the following components:      Result Value   Glucose, Bld 120 (*)    Total Bilirubin 2.3 (*)    All other components within normal limits  COOXEMETRY PANEL - Abnormal; Notable for the following components:   Carboxyhemoglobin 3.1 (*)    All other components within normal limits  BLOOD GAS, ARTERIAL - Abnormal; Notable for the following components:   Acid-Base Excess 2.8 (*)    All other components within normal limits  CBC WITH DIFFERENTIAL/PLATELET  TROPONIN I (HIGH SENSITIVITY)  TROPONIN I (HIGH SENSITIVITY)   ____________________________________________   ED ECG REPORT I, Concha Se, the attending physician, personally viewed and  interpreted this ECG.  EKG is A. fib rate of 117, no ST elevation, no T wave inversions, right bundle branch block ____________________________________________  RADIOLOGY I, Concha Se, personally viewed and evaluated these images (plain radiographs) as part of my medical decision making, as well as reviewing the written report by the radiologist.  ED MD interpretation: No pneumonia  Official radiology report(s): DG Chest Port 1 View  Result Date: 08/01/2019 CLINICAL DATA:  Shortness of breath EXAM: PORTABLE CHEST 1 VIEW COMPARISON:  02/28/2018 FINDINGS: The heart size and mediastinal contours are within normal limits. Both lungs are clear. The visualized skeletal structures are unremarkable. IMPRESSION: No active disease. Electronically Signed   By: Deatra Robinson M.D.   On: 08/01/2019 06:19    ____________________________________________   PROCEDURES  Procedure(s) performed (including Critical Care):  Procedures   ____________________________________________   INITIAL IMPRESSION / ASSESSMENT AND PLAN / ED COURSE   Scott Cortez was evaluated in Emergency Department on 08/01/2019 for the symptoms described in the history of present illness. He was evaluated in the context of the global COVID-19 pandemic, which necessitated consideration that the patient might be at risk for infection with the SARS-CoV-2 virus that causes COVID-19. Institutional protocols and algorithms that pertain to the evaluation of patients at risk for COVID-19 are in a state of rapid change based on information released by regulatory bodies including the CDC and federal and state organizations. These policies and algorithms were followed during the patient's care in the ED.     Pt presents with SOB.  Patient states that he was exposed to gas in his house and states that there was a cloud of gas.  Will get carbon monoxide level  PNA-will get xray to evaluation Anemia-CBC to evaluate ACS- will get trops  but low suspicion given no chest pain. Arrhythmia-Will get EKG and keep on monitor.  PE-lower suspicion given no risk factors and other cause more likely.  Patient on Eliquis so less likely.     Arterial blood gas negative.  Carbon monoxide only slightly elevated at 3.1.  Cardiac markers 13 and patient denies any chest pain it seems like the situation was related to gas rather than any chest discomfort.  Patient is currently on nonrebreather and already feeling better.  We will continue to monitor for  an hour.  Then will ambulate and hopefully have a safe place for patient to go home to.  Patient ended off to oncoming team pending reassessment and safe discharge.          ____________________________________________   FINAL CLINICAL IMPRESSION(S) / ED DIAGNOSES   Final diagnoses:  SOB (shortness of breath)     MEDICATIONS GIVEN DURING THIS VISIT:  Medications - No data to display   ED Discharge Orders    None       Note:  This document was prepared using Dragon voice recognition software and may include unintentional dictation errors.   Concha Se, MD 08/01/19 [phone]

## 2019-09-14 ENCOUNTER — Other Ambulatory Visit: Payer: Self-pay

## 2019-09-14 ENCOUNTER — Other Ambulatory Visit: Payer: Medicare Other | Admitting: Nurse Practitioner

## 2019-09-14 ENCOUNTER — Encounter: Payer: Self-pay | Admitting: Nurse Practitioner

## 2019-09-14 DIAGNOSIS — R270 Ataxia, unspecified: Secondary | ICD-10-CM

## 2019-09-14 DIAGNOSIS — Z515 Encounter for palliative care: Secondary | ICD-10-CM

## 2019-09-14 NOTE — Progress Notes (Signed)
Therapist, nutritional Palliative Care Consult Note Telephone: [phone]  Fax: [phone]  PATIENT NAME: ZAKI GERTSCH DOB: 1934-02-14 MRN: [id number]  PRIMARY CARE PROVIDER:   Center, [facility]  REFERRING PROVIDER:  Center, [facility] [address] [place],  [place] [address]  RESPONSIBLE PARTY:   Self  RECOMMENDATIONS and PLAN:  1. ACP: changed to DNR, MOST form completed, placed in vynca. Wishes are for full scope of treatment including ventilator but not for prolong period of time, +IVF, +antibiotics, no feeding tube  2. Palliative care encounter Palliative medicine team will continue to support patient, patient's family, and medical team. Visit consisted of counseling and education dealing with the complex and emotionally intense issues of symptom management and palliative care in the setting of serious and potentially life-threatening illness I spent 90 minutes providing this consultation,  from 1:00pm to 2:30pm. More than 50% of the time in this consultation was spent coordinating communication.   HISTORY OF PRESENT ILLNESS:  KOLDEN DUPEE is a 86 y.o. year old male with multiple medical problems including Coronary artery disease s/p stent, congestive heart failure, myocardial infarction, hypertension, hyperlipidemia, gerd, glaucoma, neuropathy, BPH, erectile dysfunction, history of bladder neck obstruction, cataract extraction with phaco. In person face-to-face follow-up palliative care visit with Mr. Held. We talked about purpose of palliative care visit. Mr. Gropp in agreement. We talked about how Mr. Dismore has been feeling. Mr. Retherford endorses that he's doing well for his condition. We talked about his recent appointment with Neurology for neuropathy. We talked about starting on gabapentin for what she has been taking for 3 weeks. Mr. Vejar endorses he does not notice a difference as of yet but hoping too soon. We talked about  symptoms of pain which he does not experience much of. We talked about his gate, walking with a walker. Mr. Sidle endorses some days he can walk better than other days. Mr. Nickson endorses today is a day where he is not walking as well. Mr Signorelli talked about moving his bedroom closer to the kitchen so he has shorter distances to walk. We talked about his ability to care for his needs. Mr. Zumstein endorses that he is able to give himself a shower independently, get dressed but it does take an extended amount of time. Mr. Maceachern does feed himself, make his meals. Mr. Hansley talked about having meals on wheels for 2 weeks but then ended up having too much food left over. Mr. Treanor endorsed is he wanted for someone to have the food that really needs it. We talked about mr. Kamel is appointment with opthamology where he had his cataracts removed. Mr. Eissler endorses was told he could get reading glasses to help with his vision. Mr. Micciche endorses he tried every different type of reading glasses which has not been helpful. Mr. Mairena endorses he is not able to read word only sees lines. Mr Crosson endorses he has a follow-up appointment with ophthalmology to further re-evaluate. We talked about Mr. Serviss has appointment with Cardiology. We talked about Mr. Dilone has appointment with Neurology Dr. Sherryll Burger. Mr. Mcclintock talked at length about his challenges with ambulating. We Revisited neurology node and it appears that physical therapy was requested. Will follow up with Harlin Rain PA to see if that has been set up as of yet. Mr Helmes talked about his sister passing, family dynamics. Mr. Degrace talked about his daughter who lives in [place] knows his wishes. We talked  about medical goals of care including aggressive versus conservative versus comfort care. We talked about most form. Read MOST form to Mr. Antunes. We talked about CPR at length. Mr. Dosher endorses he would not want to have CPR and was agreeable to do not resuscitate. We  talked about completing [place] form and Mr. Diffley in agreement. We talked about options on most form including full scope of treatment at which he was agreeable to. Mr. Franek endorses he wishes not to be prolonged on a ventilator but would like to be put on one if necessary. Documented on MOST form. Mr. Gains also in agreement antibiotics, IV fluids if needed. We talked at length about feeding two. Mr. Burgen endorses if he could not eat then he would not want a feeding tube to sustain his life. We talked about completion of MOST form in Mr. Doty in agreement. Reread MOST form with options chosen and completed. Will put in [place] /epic. Mr. Dieudonne in agreement. We talked about role of palliative care and plan of care. We talked about follow-up palliative care visit in two months if needed or sooner should he declined. Mr. Postle in agreement. Appointment schedule. Therapeutic listening, emotional support provided. Contact information provided. Wrote appointment time on his calendar on the wall.  Palliative Care was asked to help to continue to address goals of care.   CODE STATUS: DNR  PPS: 40% HOSPICE ELIGIBILITY/DIAGNOSIS: TBD  PAST MEDICAL HISTORY:  Past Medical History:  Diagnosis Date  . Bladder neck obstruction   . Bradycardia   . CHF (congestive heart failure) (HCC)   . Chronic pancreatitis (HCC)   . Coronary artery disease   . Dental bridge present    upper - bilateral  . GERD (gastroesophageal reflux disease)   . Glaucoma   . Hyperlipidemia   . Hypertension   . Myocardial infarction (HCC)   . Neuropathy    feet    SOCIAL HX:  Social History   Tobacco Use  . Smoking status: Former Smoker    Quit date: 1975    Years since quitting: 46.3  . Smokeless tobacco: Never Used  Substance Use Topics  . Alcohol use: No    ALLERGIES:  Allergies  Allergen Reactions  . Pneumococcal Vaccines Swelling     PERTINENT MEDICATIONS:  Outpatient Encounter Medications as of 09/14/2019    Medication Sig  . apixaban (ELIQUIS) 5 MG TABS tablet Take 1 tablet (5 mg total) by mouth 2 (two) times daily.  Marland Kitchen atorvastatin (LIPITOR) 20 MG tablet Take 20 mg by mouth daily.  . brimonidine (ALPHAGAN) 0.2 % ophthalmic solution Place 1 drop into both eyes 2 (two) times daily.  Marland Kitchen diltiazem (CARDIZEM CD) 120 MG 24 hr capsule Take 1 capsule (120 mg total) by mouth daily.  Marland Kitchen HYDROcodone-acetaminophen (NORCO/VICODIN) 5-325 MG tablet Take 1 tablet by mouth every 6 (six) hours as needed for moderate pain or severe pain.  Marland Kitchen latanoprost (XALATAN) 0.005 % ophthalmic solution Place 1 drop into both eyes every morning.  Marland Kitchen omeprazole (PRILOSEC) 20 MG capsule Take 20 mg by mouth daily.  . ranitidine (ZANTAC) 150 MG capsule Take 150 mg by mouth daily.  . sildenafil (VIAGRA) 100 MG tablet Take 100 mg by mouth as directed.  . tamsulosin (FLOMAX) 0.4 MG CAPS capsule Take 1 capsule (0.4 mg total) by mouth daily.  . timolol (BETIMOL) 0.5 % ophthalmic solution Place 1 drop into both eyes 2 (two) times daily.   No facility-administered encounter medications on file as  of 09/14/2019.    PHYSICAL EXAM:   General: NAD, frail appearing, thin, pleasant male Cardiovascular: regular rate and rhythm Pulmonary: clear ant fields  Montavious Wierzba Ihor Gully, NP

## 2019-10-22 ENCOUNTER — Other Ambulatory Visit: Payer: Self-pay

## 2019-10-22 ENCOUNTER — Other Ambulatory Visit: Payer: Medicare Other | Admitting: Nurse Practitioner

## 2019-10-22 ENCOUNTER — Encounter: Payer: Self-pay | Admitting: Nurse Practitioner

## 2019-10-22 DIAGNOSIS — R270 Ataxia, unspecified: Secondary | ICD-10-CM

## 2019-10-22 DIAGNOSIS — Z515 Encounter for palliative care: Secondary | ICD-10-CM

## 2019-10-22 NOTE — Progress Notes (Signed)
[facility] Consult Note Telephone: [phone]  Fax: [phone]  PATIENT NAME: Scott Cortez DOB: Aug 05, 1933 MRN: [id number]  PRIMARY CARE PROVIDER:   Center, [facility]  REFERRING PROVIDER:  Center, [place],  [place] [address]  RESPONSIBLE PARTY:   Self  RECOMMENDATIONS and PLAN: 1.ACP: changed to DNR, MOST form completed, placed in vynca. Wishes are for full scope of treatment including ventilator but not for prolong period of time, +IVF, +antibiotics, no feeding tube  2.Palliative care encounter Palliative medicine team will continue to support patient, patient's family, and medical team. Visit consisted of counseling and education dealing with the complex and emotionally intense issues of symptom management and palliative care in the setting of serious and potentially life-threatening illness  I spent 60 minutes providing this consultation,  from 1:30pm to 2:30pm. More than 50% of the time in this consultation was spent coordinating communication.   HISTORY OF PRESENT ILLNESS:  Scott Cortez is a 86 y.o. year old male with multiple medical problems including Coronary artery disease s/p stent, congestive heart failure, myocardial infarction, hypertension, hyperlipidemia, gerd, glaucoma, neuropathy, BPH,erectile dysfunction, history of bladder neck obstruction, cataract extraction with phaco. In person face-to-face follow up palliative care visit with Scott. Scott Cortez. We talked about purpose of palliative care visit. Scott Cortez in agreement. We talked about how he is feeling today. Scott Cortez endorses that he just got up from a nap. Scott Cortez endorses that his legs are not working as well today as other days. We talked about neuropathy and chronic disease progression. We talked about gait and the importance of safety. Scott. Cortez endorses he does not get up on his legs if he feels there are  trembling. He waits until he feels steady and ready and then proceeded to a standing position. We talked about fall risk. We talked about symptoms of pain which he denies. We talked about shortness of breath which he denies. We talked about his appetite. Scott. Cortez endorses that he does not always eat as he should food wise but he does have a good appetite. We talked about food said he likes. We talked about medical goals of care. We talked about coping strategies with Progressive disease. Scott. Cortez endorses he knows that his neuropathy was not going to get better and is functional decline will continue to worsen. Scott. Cortez endorses he does have an upcoming appointment with Neurology. We talked about edema to his lower extremities and hands intermittently. We talked about elevating his feet. We talked about quality of life. Scott Cortez talked about enjoying his life throughout his years. We talked about functional changes in his body and how that affects his quality of life. We talked about his perception of what a good life looks like. We talked about role of palliative care and plan of care. We talked about follow up palliative care visit in two months if needed or sooner should he declined. Scott Cortez in agreement. Therapeutic listening and emotional support provided. Contact information. Questions answered to satisfaction.  Palliative Care was asked to help to continue to address goals of care.   CODE STATUS: DNR  PPS: 50% HOSPICE ELIGIBILITY/DIAGNOSIS: TBD  PAST MEDICAL HISTORY:  Past Medical History:  Diagnosis Date  . Bladder neck obstruction   . Bradycardia   . CHF (congestive heart failure) ([place])   . Chronic pancreatitis ([place])   . Coronary artery disease   . Dental bridge  present    upper - bilateral  . GERD (gastroesophageal reflux disease)   . Glaucoma   . Hyperlipidemia   . Hypertension   . Myocardial infarction (HCC)   . Neuropathy    feet    SOCIAL HX:  Social History   Tobacco  Use  . Smoking status: Former Smoker    Quit date: 1975    Years since quitting: 46.4  . Smokeless tobacco: Never Used  Substance Use Topics  . Alcohol use: No    ALLERGIES:  Allergies  Allergen Reactions  . Pneumococcal Vaccines Swelling     PERTINENT MEDICATIONS:  Outpatient Encounter Medications as of 10/22/2019  Medication Sig  . apixaban (ELIQUIS) 5 MG TABS tablet Take 1 tablet (5 mg total) by mouth 2 (two) times daily.  Marland Kitchen atorvastatin (LIPITOR) 20 MG tablet Take 20 mg by mouth daily.  . brimonidine (ALPHAGAN) 0.2 % ophthalmic solution Place 1 drop into both eyes 2 (two) times daily.  Marland Kitchen diltiazem (CARDIZEM CD) 120 MG 24 hr capsule Take 1 capsule (120 mg total) by mouth daily.  Marland Kitchen HYDROcodone-acetaminophen (NORCO/VICODIN) 5-325 MG tablet Take 1 tablet by mouth every 6 (six) hours as needed for moderate pain or severe pain.  Marland Kitchen latanoprost (XALATAN) 0.005 % ophthalmic solution Place 1 drop into both eyes every morning.  Marland Kitchen omeprazole (PRILOSEC) 20 MG capsule Take 20 mg by mouth daily.  . ranitidine (ZANTAC) 150 MG capsule Take 150 mg by mouth daily.  . sildenafil (VIAGRA) 100 MG tablet Take 100 mg by mouth as directed.  . tamsulosin (FLOMAX) 0.4 MG CAPS capsule Take 1 capsule (0.4 mg total) by mouth daily.  . timolol (BETIMOL) 0.5 % ophthalmic solution Place 1 drop into both eyes 2 (two) times daily.   No facility-administered encounter medications on file as of 10/22/2019.    PHYSICAL EXAM:   General: NAD, pleasant male Cardiovascular: regular rate and rhythm Pulmonary: clear ant fields Neurological: unsteady gait  Franceska Strahm [place], NP

## 2019-12-15 ENCOUNTER — Other Ambulatory Visit: Payer: Medicare HMO | Admitting: Nurse Practitioner

## 2019-12-15 ENCOUNTER — Telehealth: Payer: Self-pay | Admitting: Nurse Practitioner

## 2019-12-15 ENCOUNTER — Other Ambulatory Visit: Payer: Self-pay

## 2019-12-15 NOTE — Telephone Encounter (Signed)
I called Mr Scott Cortez about scheduled him for some palliative care follow-up visit today. Mr Scott Cortez endorses that he has received some bills from [employer] and had some questions about them. Mr Scott Cortez wanted to reschedule the appointment until he got these bills situated. Discussed with [name] will have billing company and [facility] reach out to him for further discussion of the bills he received. [name] was in agreement to reschedule visit for today. [name] endorses that they did recommend some therapy for him for his legs but he continues to have more difficulty. We talked about that your saws recommendation for assisted living placement. [name] endorses he may need to look into. We talked about how palliative care can help with that process. [name] was in agreement. Therapeutic listening and emotional support provided. Contact information. Questions answered to satisfaction. Notified palliative Administration for billing questions and they will reach out to [name]  Total time spent 20 minutes  Phone discussion 15 minutes  Documentation 5 minutes

## 2019-12-18 ENCOUNTER — Emergency Department: Payer: Medicare HMO

## 2019-12-18 ENCOUNTER — Other Ambulatory Visit: Payer: Self-pay

## 2019-12-18 ENCOUNTER — Encounter: Payer: Self-pay | Admitting: Emergency Medicine

## 2019-12-18 DIAGNOSIS — Z9861 Coronary angioplasty status: Secondary | ICD-10-CM | POA: Diagnosis not present

## 2019-12-18 DIAGNOSIS — Z79899 Other long term (current) drug therapy: Secondary | ICD-10-CM | POA: Diagnosis not present

## 2019-12-18 DIAGNOSIS — I110 Hypertensive heart disease with heart failure: Secondary | ICD-10-CM | POA: Diagnosis not present

## 2019-12-18 DIAGNOSIS — Z20822 Contact with and (suspected) exposure to covid-19: Secondary | ICD-10-CM | POA: Diagnosis not present

## 2019-12-18 DIAGNOSIS — R531 Weakness: Secondary | ICD-10-CM | POA: Diagnosis not present

## 2019-12-18 DIAGNOSIS — I509 Heart failure, unspecified: Secondary | ICD-10-CM | POA: Insufficient documentation

## 2019-12-18 DIAGNOSIS — R0789 Other chest pain: Secondary | ICD-10-CM | POA: Diagnosis not present

## 2019-12-18 DIAGNOSIS — E162 Hypoglycemia, unspecified: Secondary | ICD-10-CM | POA: Diagnosis not present

## 2019-12-18 DIAGNOSIS — I480 Paroxysmal atrial fibrillation: Secondary | ICD-10-CM | POA: Diagnosis not present

## 2019-12-18 DIAGNOSIS — Z87891 Personal history of nicotine dependence: Secondary | ICD-10-CM | POA: Diagnosis not present

## 2019-12-18 LAB — URINALYSIS, COMPLETE (UACMP) WITH MICROSCOPIC
Bacteria, UA: NONE SEEN
Bilirubin Urine: NEGATIVE
Glucose, UA: NEGATIVE mg/dL
Hgb urine dipstick: NEGATIVE
Ketones, ur: NEGATIVE mg/dL
Leukocytes,Ua: NEGATIVE
Nitrite: NEGATIVE
Protein, ur: NEGATIVE mg/dL
Specific Gravity, Urine: 1.011 (ref 1.005–1.030)
pH: 5 (ref 5.0–8.0)

## 2019-12-18 LAB — BASIC METABOLIC PANEL
Anion gap: 9 (ref 5–15)
BUN: 15 mg/dL (ref 8–23)
CO2: 25 mmol/L (ref 22–32)
Calcium: 9.1 mg/dL (ref 8.9–10.3)
Chloride: 101 mmol/L (ref 98–111)
Creatinine, Ser: 0.87 mg/dL (ref 0.61–1.24)
GFR calc Af Amer: >60 mL/min (ref >60)
GFR calc non Af Amer: >60 mL/min (ref >60)
Glucose, Bld: 84 mg/dL (ref 70–99)
Potassium: 3.7 mmol/L (ref 3.5–5.1)
Sodium: 135 mmol/L (ref 135–145)

## 2019-12-18 LAB — CBC
HCT: 40.6 % (ref 39.0–52.0)
Hemoglobin: 13.8 g/dL (ref 13.0–17.0)
MCH: 30.8 pg (ref 26.0–34.0)
MCHC: 34 g/dL (ref 30.0–36.0)
MCV: 90.6 fL (ref 80.0–100.0)
Platelets: 173 10*3/uL (ref 150–400)
RBC: 4.48 MIL/uL (ref 4.22–5.81)
RDW: 13.1 % (ref 11.5–15.5)
WBC: 4.2 10*3/uL (ref 4.0–10.5)
nRBC: 0 % (ref 0.0–0.2)

## 2019-12-18 LAB — TROPONIN I (HIGH SENSITIVITY): Troponin I (High Sensitivity): 12 ng/L (ref <18)

## 2019-12-18 LAB — GLUCOSE, CAPILLARY: Glucose-Capillary: 69 mg/dL — ABNORMAL LOW (ref 70–99)

## 2019-12-18 NOTE — ED Triage Notes (Signed)
Pt presents to ER from home via EMS with complaints of right sided chest pain and weakness, pt reports chest pain to right side lasted for few minutes earlier in the day and started to feel weak. Pt reports he was dizzy, pt denies any other symptom at present. Pt talks in complete sentences no respiratory distress noted.

## 2019-12-19 ENCOUNTER — Encounter: Payer: Self-pay | Admitting: Radiology

## 2019-12-19 ENCOUNTER — Emergency Department: Payer: Medicare HMO

## 2019-12-19 ENCOUNTER — Emergency Department
Admission: EM | Admit: 2019-12-19 | Discharge: 2019-12-19 | Disposition: A | Discharge status: Home / Self Care | Payer: Medicare HMO | Attending: Emergency Medicine | Admitting: Emergency Medicine

## 2019-12-19 DIAGNOSIS — R0789 Other chest pain: Secondary | ICD-10-CM | POA: Diagnosis not present

## 2019-12-19 DIAGNOSIS — R531 Weakness: Secondary | ICD-10-CM

## 2019-12-19 DIAGNOSIS — R079 Chest pain, unspecified: Secondary | ICD-10-CM

## 2019-12-19 LAB — GLUCOSE, CAPILLARY: Glucose-Capillary: 95 mg/dL (ref 70–99)

## 2019-12-19 LAB — SARS CORONAVIRUS 2 BY RT PCR (HOSPITAL ORDER, PERFORMED IN ~~LOC~~ HOSPITAL LAB): SARS Coronavirus 2: NEGATIVE

## 2019-12-19 LAB — TROPONIN I (HIGH SENSITIVITY): Troponin I (High Sensitivity): 14 ng/L (ref <18)

## 2019-12-19 MED ORDER — CARBIDOPA-LEVODOPA 25-100 MG PO TABS
1.0000 | ORAL_TABLET | Freq: Three times a day (TID) | ORAL | Status: DC
  Administered 2019-12-19: 1 via ORAL
  Filled 2019-12-19 (×3): qty 1

## 2019-12-19 MED ORDER — IOHEXOL 350 MG/ML SOLN
75.0000 mL | Freq: Once | INTRAVENOUS | Status: AC | PRN
  Administered 2019-12-19: 75 mL via INTRAVENOUS

## 2019-12-19 MED ORDER — LIDOCAINE 5 % EX PTCH
1.0000 | MEDICATED_PATCH | CUTANEOUS | Status: DC
  Administered 2019-12-19: 1 via TRANSDERMAL
  Filled 2019-12-19: qty 1

## 2019-12-19 MED ORDER — HYDROCODONE-ACETAMINOPHEN 5-325 MG PO TABS
1.0000 | ORAL_TABLET | Freq: Four times a day (QID) | ORAL | Status: DC | PRN
  Administered 2019-12-19: 1 via ORAL
  Filled 2019-12-19: qty 1

## 2019-12-19 MED ORDER — KETOTIFEN FUMARATE 0.025 % OP SOLN
1.0000 [drp] | Freq: Two times a day (BID) | OPHTHALMIC | Status: DC
  Administered 2019-12-19: 1 [drp] via OPHTHALMIC
  Filled 2019-12-19: qty 5

## 2019-12-19 NOTE — ED Notes (Signed)
Pt ambulating in hallway with walker- no assistance required.

## 2019-12-19 NOTE — ED Notes (Signed)
Pt provided phone per his request.

## 2019-12-19 NOTE — ED Provider Notes (Signed)
[facility] Emergency Department Provider Note  ____________________________________________   First MD Initiated Contact with Patient 12/19/19 [phone]     (approximate)  I have reviewed the triage vital signs and the nursing notes.   HISTORY  Chief Complaint Chest Pain, Weakness, and Hypoglycemia   HPI Scott Cortez is a 86 y.o. male with a past medical history of CHF, Parkinson's, chronic pancreatitis, CAD, GERD, HTN, HDL, and PEs on Eliquis as well as paroxysmal A. fib who presents for assessment of right-sided chest pain that began sometime earlier this morning associated with some generalized weakness.  Patient states it is worsened with deep breathing but denies any other clear alleviating or aggravating factors.  No prior similar episodes and states this is very different from his pain he had with his heart attack.  He endorses nonproductive cough of the last several weeks but denies any headache, earache, sore throat, shortness of breath, fevers, vomiting, diarrhea, dysuria, Donnell pain, back pain, extremity pain, rash, recent falls or injuries.  Endorses remote tobacco use history but denies any current tobacco use.  Endorses THC use denies EtOH abuse.  Patient does not his eyes feel like her brain because he missed his morning eyedrops.         Past Medical History:  Diagnosis Date  . Bladder neck obstruction   . Bradycardia   . CHF (congestive heart failure) (HCC)   . Chronic pancreatitis (HCC)   . Coronary artery disease   . Dental bridge present    upper - bilateral  . GERD (gastroesophageal reflux disease)   . Glaucoma   . Hyperlipidemia   . Hypertension   . Myocardial infarction (HCC)   . Neuropathy    feet    Patient Active Problem List   Diagnosis Date Noted  . Near syncope 03/07/2019  . Nocturia 06/26/2016  . Lower abdominal pain 09/04/2015  . Erectile dysfunction 08/30/2014  . BPH with obstruction/lower urinary tract  symptoms 01/26/2014  . Incomplete emptying of bladder 01/26/2014  . Increased frequency of urination 01/26/2014  . Slowing of urinary stream 01/26/2014    Past Surgical History:  Procedure Laterality Date  . CATARACT EXTRACTION W/PHACO Left 02/02/2019   Procedure: CATARACT EXTRACTION PHACO AND INTRAOCULAR LENS PLACEMENT (IOC) LEFT ISTENT INJ AND ISTENT 01:09.1        11.3%          8.47;  Surgeon: Nevada CraneKing, Bradley Mark, MD;  [facility];  Service: Ophthalmology;  Laterality: Left;  . CATARACT EXTRACTION W/PHACO Right 02/23/2019   Procedure: CATARACT EXTRACTION PHACO AND INTRAOCULAR LENS PLACEMENT (IOC) RIGHT ISTENT INJ 01:09.1  14.1%  9.78;  Surgeon: Nevada CraneKing, Bradley Mark, MD;  [facility];  Service: Ophthalmology;  Laterality: Right;  . COLONOSCOPY N/A 10/15/2014   Procedure: COLONOSCOPY;  Surgeon: Scot Junobert T Elliott, MD;  [facility];  Service: Endoscopy;  Laterality: N/A;  . CORONARY ANGIOPLASTY     3 stents  . coronary stenting     2007 (2), 2008 (1)  . ESOPHAGOGASTRODUODENOSCOPY (EGD) WITH PROPOFOL N/A 10/15/2014   Procedure: ESOPHAGOGASTRODUODENOSCOPY (EGD) WITH PROPOFOL;  Surgeon: Scot Junobert T Elliott, MD;  [facility];  Service: Endoscopy;  Laterality: N/A;  . INSERTION OF ANTERIOR SEGMENT AQUEOUS DRAINAGE DEVICE (ISTENT) Left 02/02/2019   Procedure: INSERTION OF ANTERIOR SEGMENT AQUEOUS DRAINAGE DEVICE (ISTENT);  Surgeon: Nevada CraneKing, Bradley Mark, MD;  [facility];  Service: Ophthalmology;  Laterality: Left;  . SAVORY DILATION N/A 10/15/2014   Procedure:  SAVORY DILATION;  Surgeon: Scot Jun, MD;  Location: [facility] ENDOSCOPY;  Service: Endoscopy;  Laterality: N/A;    Prior to Admission medications   Medication Sig Start Date End Date Taking? Authorizing Provider  apixaban (ELIQUIS) 5 MG TABS tablet Take 1 tablet (5 mg total) by mouth 2 (two) times daily. 03/12/19   Delfino Lovett, MD  atorvastatin (LIPITOR) 20 MG tablet Take  20 mg by mouth daily.    [provider]  brimonidine (ALPHAGAN) 0.2 % ophthalmic solution Place 1 drop into both eyes 2 (two) times daily.    [provider]  diltiazem (CARDIZEM CD) 120 MG 24 hr capsule Take 1 capsule (120 mg total) by mouth daily. 03/12/19   Delfino Lovett, MD  HYDROcodone-acetaminophen (NORCO/VICODIN) 5-325 MG tablet Take 1 tablet by mouth every 6 (six) hours as needed for moderate pain or severe pain. 04/17/19   Sharyn Creamer, MD  latanoprost (XALATAN) 0.005 % ophthalmic solution Place 1 drop into both eyes every morning.    [provider]  omeprazole (PRILOSEC) 20 MG capsule Take 20 mg by mouth daily.    [provider]  ranitidine (ZANTAC) 150 MG capsule Take 150 mg by mouth daily.    [provider]  sildenafil (VIAGRA) 100 MG tablet Take 100 mg by mouth as directed. 06/16/12   [provider]  tamsulosin (FLOMAX) 0.4 MG CAPS capsule Take 1 capsule (0.4 mg total) by mouth daily. 06/18/19   Stoioff, Verna Czech, MD  timolol (BETIMOL) 0.5 % ophthalmic solution Place 1 drop into both eyes 2 (two) times daily.    [provider]    Allergies Pneumococcal vaccines  No family history on file.  Social History Social History   Tobacco Use  . Smoking status: Former Smoker    Quit date: 1975    Years since quitting: 46.6  . Smokeless tobacco: Never Used  Vaping Use  . Vaping Use: Never used  Substance Use Topics  . Alcohol use: No  . Drug use: Not Currently    Types: Marijuana    Comment: none since 2019    Review of Systems  Review of Systems  Constitutional: Negative for chills and fever.  HENT: Negative for sore throat.   Eyes: Positive for pain.  Respiratory: Negative for cough and stridor.   Cardiovascular: Positive for chest pain.  Gastrointestinal: Negative for vomiting.  Skin: Negative for rash.  Neurological: Negative for seizures, loss of consciousness and headaches.  Psychiatric/Behavioral:  Negative for suicidal ideas.  All other systems reviewed and are negative.     ____________________________________________   PHYSICAL EXAM:  VITAL SIGNS: ED Triage Vitals [12/18/19 2004]  Enc Vitals Group     BP 138/80     Pulse Rate (!) 53     Resp 20     Temp 97.7 F (36.5 C)     Temp Source Oral     SpO2 97 %     Weight 162 lb (73.5 kg)     Height 5\' 9"  (1.753 m)     Head Circumference      Peak Flow      Pain Score 0     Pain Loc      Pain Edu?      Excl. in GC?    Vitals:   12/19/19 1123 12/19/19 1128  BP:  (!) 168/95  Pulse:  68  Resp: 20   Temp:    SpO2:  98%   Physical Exam Vitals and nursing  note reviewed.  Constitutional:      Appearance: He is well-developed.  HENT:     Head: Normocephalic and atraumatic.     Right Ear: External ear normal.     Left Ear: External ear normal.     Nose: Nose normal.  Eyes:     Conjunctiva/sclera: Conjunctivae normal.  Cardiovascular:     Rate and Rhythm: Normal rate and regular rhythm.     Pulses: Normal pulses.     Heart sounds: No murmur heard.   Pulmonary:     Effort: Pulmonary effort is normal. No respiratory distress.     Breath sounds: Normal breath sounds.  Abdominal:     Palpations: Abdomen is soft.     Tenderness: There is no abdominal tenderness.  Musculoskeletal:     Cervical back: Neck supple.     Right lower leg: No edema.     Left lower leg: No edema.  Skin:    General: Skin is warm and dry.     Capillary Refill: Capillary refill takes less than 2 seconds.  Neurological:     General: No focal deficit present.     Mental Status: He is alert and oriented to person, place, and time.  Psychiatric:        Mood and Affect: Mood normal.      ____________________________________________   LABS (all labs ordered are listed, but only abnormal results are displayed)  Labs Reviewed  URINALYSIS, COMPLETE (UACMP) WITH MICROSCOPIC - Abnormal; Notable for the following components:      Result  Value   Color, Urine YELLOW (*)    APPearance CLEAR (*)    All other components within normal limits  GLUCOSE, CAPILLARY - Abnormal; Notable for the following components:   Glucose-Capillary 69 (*)    All other components within normal limits  SARS CORONAVIRUS 2 BY RT PCR (HOSPITAL ORDER, PERFORMED IN [facility])  BASIC METABOLIC PANEL  CBC  GLUCOSE, CAPILLARY  GLUCOSE, CAPILLARY  CBG MONITORING, ED  TROPONIN I (HIGH SENSITIVITY)  TROPONIN I (HIGH SENSITIVITY)   ____________________________________________  EKG  Slow A. fib with a rate of 55, PVCs, incomplete right bundle branch block, normal axis, unremarkable intervals, no evidence of acute ischemia or other significant electrolyte. ____________________________________________  RADIOLOGY  Official radiology report(s): DG Chest 2 View  Result Date: 12/18/2019 CLINICAL DATA:  Right-sided chest pain and weakness dizziness. EXAM: CHEST - 2 VIEW COMPARISON:  08/01/2019 FINDINGS: Mild cardiac enlargement. No vascular congestion, edema, or consolidation. Mild emphysematous changes and scattered fibrosis in the lungs. No pleural effusions. No pneumothorax. Mediastinal contours appear intact. Calcified and tortuous aorta. Degenerative changes in the spine and shoulders. IMPRESSION: Mild cardiac enlargement. No evidence of active pulmonary disease. Emphysematous and chronic bronchitic changes. Aortic atherosclerosis. Electronically Signed   By: Burman Nieves M.D.   On: 12/18/2019 21:33   CT Angio Chest PE W and/or Wo Contrast  Result Date: 12/19/2019 CLINICAL DATA:  Right chest pain, weakness EXAM: CT ANGIOGRAPHY CHEST WITH CONTRAST TECHNIQUE: Multidetector CT imaging of the chest was performed using the standard protocol during bolus administration of intravenous contrast. Multiplanar CT image reconstructions and MIPs were obtained to evaluate the vascular anatomy. CONTRAST:  46mL OMNIPAQUE IOHEXOL 350 MG/ML SOLN COMPARISON:   Chest radiographs dated 12/18/2019 FINDINGS: Cardiovascular: Satisfactory opacification of the bilateral pulmonary arteries to the segmental level. No evidence of pulmonary embolism. No evidence of thoracic aortic aneurysm. Atherosclerotic calcifications of the aortic arch. Mild cardiomegaly.  No pericardial effusion. Coronary  atherosclerosis of the LAD and right coronary artery. Mediastinum/Nodes: No suspicious mediastinal lymphadenopathy. Visualized thyroid is unremarkable. Lungs/Pleura: Moderate paraseptal emphysematous changes, upper lung predominant. Dependent atelectasis in the bilateral lower lobes. No focal consolidation. No suspicious pulmonary nodules. No pleural effusion or pneumothorax. Upper Abdomen: Visualized upper abdomen is grossly unremarkable, noting vascular calcifications. Musculoskeletal: Degenerative changes of the visualized thoracolumbar spine. Review of the MIP images confirms the above findings. IMPRESSION: No evidence of pulmonary embolism. No evidence of acute cardiopulmonary disease. Aortic Atherosclerosis (ICD10-I70.0) and Emphysema (ICD10-J43.9). Electronically Signed   By: Charline Bills M.D.   On: 12/19/2019 10:38    ____________________________________________   PROCEDURES  Procedure(s) performed (including Critical Care):  .1-3 Lead EKG Interpretation Performed by: Gilles Chiquito, MD Authorized by: Gilles Chiquito, MD     Interpretation: abnormal     ECG rate assessment: bradycardic     Rhythm: atrial fibrillation     Ectopy: PVCs       ____________________________________________   INITIAL IMPRESSION / ASSESSMENT AND PLAN / ED COURSE        Overall it is unclear the precise etiology for patient's right-sided chest pain.  Low suspicion for ACS given nonelevated troponin obtained over several hours and no clear evidence of acute ischemia on EKG or any history of left-sided chest pain.  In addition patient has known A. fib and he appears well rate  controlled on his carvedilol.  In addition there is no evidence of pneumonia, rib fracture, or right-sided pathology on exam or CT.  No significant electrolyte or metabolic derangements identified on above noted labs and CBC is not consistent with acute symptomatic anemia.  Patient does not appear volume overloaded and there are no focal deficits on exam to suggest CVA.  On my reassessment patient did endorse some lower back pain which he states is chronic.  His home analgesic medications were ordered as well as his Parkinson medications and he was noted to ambulate steadily using a walker approximately 70 feet in the hallways without any difficulty or assistance.  Given reassuring work-up with patient ambulating apparently at baseline stating he felt much better than when he had arrived to the ED I do believe he is safe for discharge with plan for close outpatient follow-up.  Patient discharged stable condition.  Strict return precautions advised and discussed.  Medications  ketotifen (ZADITOR) 0.025 % ophthalmic solution 1 drop (1 drop Both Eyes Given 12/19/19 1050)  HYDROcodone-acetaminophen (NORCO/VICODIN) 5-325 MG per tablet 1 tablet (1 tablet Oral Given 12/19/19 1127)  lidocaine (LIDODERM) 5 % 1 patch (1 patch Transdermal Patch Applied 12/19/19 1159)  carbidopa-levodopa (SINEMET IR) 25-100 MG per tablet immediate release 1 tablet (1 tablet Oral Given 12/19/19 1201)  iohexol (OMNIPAQUE) 350 MG/ML injection 75 mL (75 mLs Intravenous Contrast Given 12/19/19 1013)    ____________________________________________   FINAL CLINICAL IMPRESSION(S) / ED DIAGNOSES  Final diagnoses:  Weakness  Chest pain, unspecified type     ED Discharge Orders    None       Note:  This document was prepared using Dragon voice recognition software and may include unintentional dictation errors.   Gilles Chiquito, MD 12/19/19 [phone]

## 2019-12-19 NOTE — ED Notes (Signed)
Pt ambulating independently to toilet with walker at this time.

## 2019-12-22 LAB — GLUCOSE, CAPILLARY: Glucose-Capillary: 116 mg/dL — ABNORMAL HIGH (ref 70–99)

## 2020-01-15 ENCOUNTER — Telehealth: Payer: Self-pay | Admitting: Nurse Practitioner

## 2020-01-15 ENCOUNTER — Other Ambulatory Visit: Payer: Self-pay

## 2020-01-15 ENCOUNTER — Other Ambulatory Visit: Payer: Medicare HMO | Admitting: Nurse Practitioner

## 2020-01-15 NOTE — Telephone Encounter (Signed)
I called Scott Cortez to confirm his PC in home visit today. Scott Cortez was upset about the bill he received from [facility]. Discussed and sent a message to billing department to contact Scott Cortez. Scott Cortez asked to postpone his appointment call him next week when all this gets straight. Will recontact next week per his request

## 2020-01-20 ENCOUNTER — Telehealth: Payer: Self-pay

## 2020-01-20 NOTE — Telephone Encounter (Signed)
Palliative care volunteer call made. No answer

## 2020-02-04 ENCOUNTER — Telehealth: Payer: Self-pay | Admitting: Nurse Practitioner

## 2020-02-04 NOTE — Telephone Encounter (Signed)
I called to reschedule f/u PC visit. Scott Cortez endorses he is doing good, does not need PC and will recontact later in time if he decides he need PC. Please d/c

## 2020-06-17 ENCOUNTER — Ambulatory Visit: Payer: Medicare Other | Admitting: Urology

## 2020-06-26 ENCOUNTER — Emergency Department
Admission: EM | Admit: 2020-06-26 | Discharge: 2020-06-26 | Disposition: A | Discharge status: Home / Self Care | Payer: Medicare HMO | Attending: Emergency Medicine | Admitting: Emergency Medicine

## 2020-06-26 ENCOUNTER — Encounter: Payer: Self-pay | Admitting: Emergency Medicine

## 2020-06-26 ENCOUNTER — Other Ambulatory Visit: Payer: Self-pay

## 2020-06-26 ENCOUNTER — Emergency Department: Payer: Medicare HMO

## 2020-06-26 DIAGNOSIS — Z79899 Other long term (current) drug therapy: Secondary | ICD-10-CM | POA: Diagnosis not present

## 2020-06-26 DIAGNOSIS — I509 Heart failure, unspecified: Secondary | ICD-10-CM | POA: Diagnosis not present

## 2020-06-26 DIAGNOSIS — Z7901 Long term (current) use of anticoagulants: Secondary | ICD-10-CM | POA: Diagnosis not present

## 2020-06-26 DIAGNOSIS — I2510 Atherosclerotic heart disease of native coronary artery without angina pectoris: Secondary | ICD-10-CM | POA: Diagnosis not present

## 2020-06-26 DIAGNOSIS — I110 Hypertensive heart disease with heart failure: Secondary | ICD-10-CM | POA: Diagnosis not present

## 2020-06-26 DIAGNOSIS — Z87891 Personal history of nicotine dependence: Secondary | ICD-10-CM | POA: Insufficient documentation

## 2020-06-26 DIAGNOSIS — M25512 Pain in left shoulder: Secondary | ICD-10-CM | POA: Insufficient documentation

## 2020-06-26 DIAGNOSIS — Z955 Presence of coronary angioplasty implant and graft: Secondary | ICD-10-CM | POA: Insufficient documentation

## 2020-06-26 LAB — CBC
HCT: 41.7 % (ref 39.0–52.0)
Hemoglobin: 14.2 g/dL (ref 13.0–17.0)
MCH: 31.6 pg (ref 26.0–34.0)
MCHC: 34.1 g/dL (ref 30.0–36.0)
MCV: 92.7 fL (ref 80.0–100.0)
Platelets: 168 10*3/uL (ref 150–400)
RBC: 4.5 MIL/uL (ref 4.22–5.81)
RDW: 12.8 % (ref 11.5–15.5)
WBC: 3.9 10*3/uL — ABNORMAL LOW (ref 4.0–10.5)
nRBC: 0 % (ref 0.0–0.2)

## 2020-06-26 LAB — BASIC METABOLIC PANEL
Anion gap: 10 (ref 5–15)
BUN: 14 mg/dL (ref 8–23)
CO2: 24 mmol/L (ref 22–32)
Calcium: 9.2 mg/dL (ref 8.9–10.3)
Chloride: 99 mmol/L (ref 98–111)
Creatinine, Ser: 0.88 mg/dL (ref 0.61–1.24)
GFR, Estimated: >60 mL/min (ref >60)
Glucose, Bld: 97 mg/dL (ref 70–99)
Potassium: 3.9 mmol/L (ref 3.5–5.1)
Sodium: 133 mmol/L — ABNORMAL LOW (ref 135–145)

## 2020-06-26 LAB — TROPONIN I (HIGH SENSITIVITY)
Troponin I (High Sensitivity): 17 ng/L (ref <18)
Troponin I (High Sensitivity): 19 ng/L — ABNORMAL HIGH (ref <18)

## 2020-06-26 NOTE — ED Notes (Signed)
Patient called significant other to pick him up

## 2020-06-26 NOTE — ED Provider Notes (Signed)
[facility] Emergency Department Provider Note  Time seen: 5:18 PM  I have reviewed the triage vital signs and the nursing notes.   HISTORY  Chief Complaint Shoulder Pain   HPI Scott Cortez is a 87 y.o. male with a past medical history of CHF, chronic pancreatitis, gastric reflux, hypertension, hyperlipidemia, prior MIs, presents to the emergency department for left shoulder pain.  According to the patient he woke up this morning with some discomfort in his left shoulder somewhat worse with movement.  Patient states a history of 2 myocardial infarctions previously and was told if he has any chest pain or arm pain he should come to the emergency department for evaluation.  Patient states the pain is since resolved, denies any discomfort currently.  Denies any shortness of breath nausea or diaphoresis at any point.  Patient has no complaints currently but states he wanted to be safe given his history.   Past Medical History:  Diagnosis Date  . Bladder neck obstruction   . Bradycardia   . CHF (congestive heart failure) (HCC)   . Chronic pancreatitis (HCC)   . Coronary artery disease   . Dental bridge present    upper - bilateral  . GERD (gastroesophageal reflux disease)   . Glaucoma   . Hyperlipidemia   . Hypertension   . Myocardial infarction (HCC)   . Neuropathy    feet    Patient Active Problem List   Diagnosis Date Noted  . Near syncope 03/07/2019  . Nocturia 06/26/2016  . Lower abdominal pain 09/04/2015  . Erectile dysfunction 08/30/2014  . 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 Incomplete emptying of bladder 01/26/2014  . Increased frequency of urination 01/26/2014  . Slowing of urinary stream 01/26/2014    Past Surgical History:  Procedure Laterality Date  . CATARACT EXTRACTION W/PHACO Left 02/02/2019   Procedure: CATARACT EXTRACTION PHACO AND INTRAOCULAR LENS PLACEMENT (IOC) LEFT ISTENT INJ AND ISTENT 01:09.1         11.3%          8.47;  Surgeon: Nevada Crane, MD;  [facility];  Service: Ophthalmology;  Laterality: Left;  . CATARACT EXTRACTION W/PHACO Right 02/23/2019   Procedure: CATARACT EXTRACTION PHACO AND INTRAOCULAR LENS PLACEMENT (IOC) RIGHT ISTENT INJ 01:09.1  14.1%  9.78;  Surgeon: Nevada Crane, MD;  Location: [facility];  Service: Ophthalmology;  Laterality: Right;  . COLONOSCOPY N/A 10/15/2014   Procedure: COLONOSCOPY;  Surgeon: Scot Jun, MD;  Location: [facility] END[facility]COPY;  Service: Endoscopy;  Laterality: N/A;  . CORONARY ANGIOPLASTY     3 stents  . coronary stenting     2007 (2), 2008 (1)  . ESOPHAGOGASTRODUODENOSCOPY (EGD) WITH PROPOFOL N/A 10/15/2014   Procedure: ESOPHAGOGASTRODUODENOSCOPY (EGD) WITH PROPOFOL;  Surgeon: Scot Jun, MD;  Location: [facility]COPY;  Service: Endoscopy;  Laterality: N/A;  . INSERTION OF ANTERIOR SEGMENT AQUEOUS DRAINAGE DEVICE (ISTENT) Left 02/02/2019   Procedure: INSERTION OF ANTERIOR SEGMENT AQUEOUS DRAINAGE DEVICE (ISTENT);  Surgeon: Nevada Crane, MD;  Location: [facility];  Service: Ophthalmology;  Laterality: Left;  . SAVORY DILATION N/A 10/15/2014   Procedure: SAVORY DILATION;  Surgeon: Scot Jun, MD;  Location: [facility];  Service: Endoscopy;  Laterality: N/A;    Prior to Admission medications   Medication Sig Start Date End Date Taking? Authorizing Provider  apixaban (ELIQUIS) 5 MG TABS tablet Take 1 tablet (5 mg total) by mouth 2 (two) times daily. 03/12/19  Delfino Lovett, MD  atorvastatin (LIPITOR) 20 MG tablet Take 20 mg by mouth daily.    [provider]  brimonidine (ALPHAGAN) 0.2 % ophthalmic solution Place 1 drop into both eyes 2 (two) times daily.    [provider]  diltiazem (CARDIZEM CD) 120 MG 24 hr capsule Take 1 capsule (120 mg total) by mouth daily. 03/12/19   Delfino Lovett, MD  HYDROcodone-acetaminophen (NORCO/VICODIN) 5-325 MG tablet Take 1 tablet by  mouth every 6 (six) hours as needed for moderate pain or severe pain. 04/17/19   Sharyn Creamer, MD  latanoprost (XALATAN) 0.005 % ophthalmic solution Place 1 drop into both eyes every morning.    [provider]  omeprazole (PRILOSEC) 20 MG capsule Take 20 mg by mouth daily.    [provider]  ranitidine (ZANTAC) 150 MG capsule Take 150 mg by mouth daily.    [provider]  sildenafil (VIAGRA) 100 MG tablet Take 100 mg by mouth as directed. 06/16/12   [provider]  tamsulosin (FLOMAX) 0.4 MG CAPS capsule Take 1 capsule (0.4 mg total) by mouth daily. 06/18/19   Stoioff, Verna Czech, MD  timolol (BETIMOL) 0.5 % ophthalmic solution Place 1 drop into both eyes 2 (two) times daily.    [provider]    Allergies  Allergen Reactions  . Pneumococcal Vaccines Swelling    No family history on file.  Social History Social History   Tobacco Use  . Smoking status: Former Smoker    Quit date: 1975    Years since quitting: 47.1  . Smokeless tobacco: Never Used  Vaping Use  . Vaping Use: Never used  Substance Use Topics  . Alcohol use: No  . Drug use: Not Currently    Types: Marijuana    Comment: none since 2019    Review of Systems Constitutional: Negative for fever. Cardiovascular: Negative for chest pain. Respiratory: Negative for shortness of breath. Gastrointestinal: Negative for abdominal pain, vomiting  Musculoskeletal: Mild to moderate dull shoulder pain this morning now resolved.  No trauma. Neurological: Negative for headache All other ROS negative  ____________________________________________   PHYSICAL EXAM:  VITAL SIGNS: ED Triage Vitals  Enc Vitals Group     BP 06/26/20 1317 (!) 163/88     Pulse Rate 06/26/20 1317 (!) 56     Resp 06/26/20 1317 16     Temp 06/26/20 1317 98.4 F (36.9 C)     Temp Source 06/26/20 1317 Oral     SpO2 06/26/20 1317 98 %     Weight 06/26/20 1318 172 lb (78 kg)     Height 06/26/20 1318 5\' 9"   (1.753 m)     Head Circumference --      Peak Flow --      Pain Score 06/26/20 1317 4     Pain Loc --      Pain Edu? --      Excl. in GC? --     Constitutional: Alert and oriented. Well appearing and in no distress. Eyes: Normal exam ENT      Head: Normocephalic and atraumatic.      Mouth/Throat: Mucous membranes are moist. Cardiovascular: Normal rate, regular rhythm.  Respiratory: Normal respiratory effort without tachypnea nor retractions. Breath sounds are clear  Gastrointestinal: Soft and nontender. No distention. Musculoskeletal: Nontender with normal range of motion in all extremities.  No left shoulder tenderness to palpation.  Great range of motion left upper extremity without pain elicited.  Neuro vastly intact. Neurologic:  Normal speech and language. No gross focal neurologic deficits  Skin:  Skin is warm, dry and intact.  Psychiatric: Mood and affect are normal.   ____________________________________________    EKG  EKG viewed and interpreted by myself shows sinus bradycardia 51 bpm with a narrow QRS, normal axis, normal intervals, no concerning ST findings.  ____________________________________________    RADIOLOGY  Chest x-ray does not appear to show any acute edema or opacities.  ____________________________________________   INITIAL IMPRESSION / ASSESSMENT AND PLAN / ED COURSE  Pertinent labs & imaging results that were available during my care of the patient were reviewed by me and considered in my medical decision making (see chart for details).   Patient presents emergency department for left shoulder pain after awakening this morning.  Patient states the pain resolved and is currently pain-free however given his history of myocardial infarction's in the past he wanted to be safe so he came to the emergency department for evaluation.  Overall patient appears well with a reassuring physical exam, reassuring vitals.  Patient's labs are overall reassuring as  well with a troponin of 17.  We will repeat a troponin as a precaution.  Labs otherwise reassuring as well.  EKG and chest x-ray also reassuring.  Suspect possible musculoskeletal pain, as the pain has resolved and the patient's work-up is reassuring anticipate likely discharge home with cardiology follow-up.  Repeat troponin largely unchanged at 19.  Overall patient appears well.  We will discharge patient home with PCP follow-up.  Scott Cortez was evaluated in Emergency Department on 06/26/2020 for the symptoms described in the history of present illness. He was evaluated in the context of the global COVID-19 pandemic, which necessitated consideration that the patient might be at risk for infection with the SARS-CoV-2 virus that causes COVID-19. Institutional protocols and algorithms that pertain to the evaluation of patients at risk for COVID-19 are in a state of rapid change based on information released by regulatory bodies including the CDC and federal and state organizations. These policies and algorithms were followed during the patient's care in the ED.  ____________________________________________   FINAL CLINICAL IMPRESSION(S) / ED DIAGNOSES  Left shoulder pain   Minna Antis, MD 06/26/20 [phone]

## 2020-06-26 NOTE — ED Triage Notes (Signed)
Patient presents to the ED via EMS from home with left shoulder pain radiating down his left arm.  Patient reports history of multiple heart attacks as well as parkinson's disease and he states he has been instructed to come and get checked out anytime he feels like something is wrong.  Patient states pain to shoulder has improved and he states, "I don't know if I can feel it now or not."

## 2020-09-12 ENCOUNTER — Other Ambulatory Visit: Payer: Self-pay | Admitting: Family Medicine

## 2020-09-12 MED ORDER — TAMSULOSIN HCL 0.4 MG PO CAPS: 0.4000 mg | ORAL_CAPSULE | Freq: Every day | ORAL | 1 refills | Status: DC

## 2020-12-22 ENCOUNTER — Emergency Department: Payer: Medicare Other

## 2020-12-22 ENCOUNTER — Other Ambulatory Visit: Payer: Self-pay

## 2020-12-22 ENCOUNTER — Emergency Department
Admission: EM | Admit: 2020-12-22 | Discharge: 2020-12-22 | Disposition: A | Discharge status: Home / Self Care | Payer: Medicare Other | Attending: Emergency Medicine | Admitting: Emergency Medicine

## 2020-12-22 ENCOUNTER — Encounter: Payer: Self-pay | Admitting: Emergency Medicine

## 2020-12-22 DIAGNOSIS — I509 Heart failure, unspecified: Secondary | ICD-10-CM | POA: Insufficient documentation

## 2020-12-22 DIAGNOSIS — Z7901 Long term (current) use of anticoagulants: Secondary | ICD-10-CM | POA: Diagnosis not present

## 2020-12-22 DIAGNOSIS — Z87891 Personal history of nicotine dependence: Secondary | ICD-10-CM | POA: Diagnosis not present

## 2020-12-22 DIAGNOSIS — I110 Hypertensive heart disease with heart failure: Secondary | ICD-10-CM | POA: Insufficient documentation

## 2020-12-22 DIAGNOSIS — I2510 Atherosclerotic heart disease of native coronary artery without angina pectoris: Secondary | ICD-10-CM | POA: Diagnosis not present

## 2020-12-22 DIAGNOSIS — R42 Dizziness and giddiness: Secondary | ICD-10-CM | POA: Insufficient documentation

## 2020-12-22 DIAGNOSIS — Z79899 Other long term (current) drug therapy: Secondary | ICD-10-CM | POA: Insufficient documentation

## 2020-12-22 LAB — URINALYSIS, COMPLETE (UACMP) WITH MICROSCOPIC
Bacteria, UA: NONE SEEN
Bilirubin Urine: NEGATIVE
Glucose, UA: NEGATIVE mg/dL
Hgb urine dipstick: NEGATIVE
Ketones, ur: 5 mg/dL — AB
Leukocytes,Ua: NEGATIVE
Nitrite: NEGATIVE
Protein, ur: NEGATIVE mg/dL
Specific Gravity, Urine: 1.01 (ref 1.005–1.030)
Squamous Epithelial / HPF: NONE SEEN (ref 0–5)
WBC, UA: NONE SEEN WBC/hpf (ref 0–5)
pH: 5 (ref 5.0–8.0)

## 2020-12-22 LAB — CBC WITH DIFFERENTIAL/PLATELET
Abs Immature Granulocytes: 0.01 10*3/uL (ref 0.00–0.07)
Basophils Absolute: 0 10*3/uL (ref 0.0–0.1)
Basophils Relative: 0 %
Eosinophils Absolute: 0.1 10*3/uL (ref 0.0–0.5)
Eosinophils Relative: 2 %
HCT: 34.9 % — ABNORMAL LOW (ref 39.0–52.0)
Hemoglobin: 12.1 g/dL — ABNORMAL LOW (ref 13.0–17.0)
Immature Granulocytes: 0 %
Lymphocytes Relative: 17 %
Lymphs Abs: 0.8 10*3/uL (ref 0.7–4.0)
MCH: 32.9 pg (ref 26.0–34.0)
MCHC: 34.7 g/dL (ref 30.0–36.0)
MCV: 94.8 fL (ref 80.0–100.0)
Monocytes Absolute: 0.7 10*3/uL (ref 0.1–1.0)
Monocytes Relative: 13 %
Neutro Abs: 3.4 10*3/uL (ref 1.7–7.7)
Neutrophils Relative %: 68 %
Platelets: 135 10*3/uL — ABNORMAL LOW (ref 150–400)
RBC: 3.68 MIL/uL — ABNORMAL LOW (ref 4.22–5.81)
RDW: 13.2 % (ref 11.5–15.5)
WBC: 5 10*3/uL (ref 4.0–10.5)
nRBC: 0 % (ref 0.0–0.2)

## 2020-12-22 LAB — COMPREHENSIVE METABOLIC PANEL
ALT: 6 U/L (ref 0–44)
AST: 18 U/L (ref 15–41)
Albumin: 3.5 g/dL (ref 3.5–5.0)
Alkaline Phosphatase: 65 U/L (ref 38–126)
Anion gap: 8 (ref 5–15)
BUN: 26 mg/dL — ABNORMAL HIGH (ref 8–23)
CO2: 26 mmol/L (ref 22–32)
Calcium: 8.6 mg/dL — ABNORMAL LOW (ref 8.9–10.3)
Chloride: 100 mmol/L (ref 98–111)
Creatinine, Ser: 0.92 mg/dL (ref 0.61–1.24)
GFR, Estimated: >60 mL/min (ref >60)
Glucose, Bld: 90 mg/dL (ref 70–99)
Potassium: 3.6 mmol/L (ref 3.5–5.1)
Sodium: 134 mmol/L — ABNORMAL LOW (ref 135–145)
Total Bilirubin: 3.6 mg/dL — ABNORMAL HIGH (ref 0.3–1.2)
Total Protein: 6.1 g/dL — ABNORMAL LOW (ref 6.5–8.1)

## 2020-12-22 LAB — TROPONIN I (HIGH SENSITIVITY): Troponin I (High Sensitivity): 10 ng/L (ref <18)

## 2020-12-22 MED ORDER — CARBIDOPA-LEVODOPA ER 50-200 MG PO TBCR
1.0000 | EXTENDED_RELEASE_TABLET | Freq: Once | ORAL | Status: AC
  Administered 2020-12-22: 1 via ORAL
  Filled 2020-12-22: qty 1

## 2020-12-22 NOTE — Discharge Instructions (Addendum)
Please follow-up with your primary care provider.  Return to the emergency department for symptoms of change or worsen if you are unable to schedule an appointment.

## 2020-12-22 NOTE — ED Notes (Signed)
Patient transported to CT 

## 2020-12-22 NOTE — ED Provider Notes (Signed)
[facility] Emergency Department Provider Note ____________________________________________   Event Date/Time   First MD Initiated Contact with Patient 12/22/20 1534     (approximate)  I have reviewed the triage vital signs and the nursing notes.   HISTORY  Chief Complaint Hypotension  HPI Scott Cortez is a 87 y.o. male with history of CHF, hyperlipidemia, hypertension, MI and remaining history as listed below presents to the emergency department for treatment and evaluation of hypotension and dizziness.  EMS report BP of 88 systolic on initial reading.  He also appeared to be in A. fib with PVCs and PACs.  Patient currently denies dizziness.  Patient also denies chest pain or shortness of breath.       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   Incomplete emptying of bladder 01/26/2014   Increased frequency of urination 01/26/2014   Slowing of urinary stream 01/26/2014    Past Surgical History:  Procedure Laterality Date   CATARACT EXTRACTION W/PHACO Left 02/02/2019   Procedure: CATARACT EXTRACTION PHACO AND INTRAOCULAR LENS PLACEMENT (IOC) LEFT ISTENT INJ AND ISTENT 01:09.1        11.3%          8.47;  Surgeon: Nevada Crane, MD;  Location: [facility];  Service: Ophthalmology;  Laterality: Left;   CATARACT EXTRACTION W/PHACO Right 02/23/2019   Procedure: CATARACT  EXTRACTION PHACO AND INTRAOCULAR LENS PLACEMENT (IOC) RIGHT ISTENT INJ 01:09.1  14.1%  9.78;  Surgeon: Nevada Crane, MD;  Location: [facility];  Service: Ophthalmology;  Laterality: Right;   COLONOSCOPY N/A 10/15/2014   Procedure: COLONOSCOPY;  Surgeon: Scot Jun, MD;  Location: [facility] ENDOSCOPY;  Service: Endoscopy;  Laterality: N/A;   CORONARY ANGIOPLASTY     3 stents   coronary stenting     2007 (2), 2008 (1)   ESOPHAGOGASTRODUODENOSCOPY (EGD) WITH PROPOFOL N/A 10/15/2014   Procedure: ESOPHAGOGASTRODUODENOSCOPY (EGD) WITH PROPOFOL;  Surgeon: Scot Jun, MD;  Location: [facility] ENDOSCOPY;  Service: Endoscopy;  Laterality: N/A;   INSERTION OF ANTERIOR SEGMENT AQUEOUS DRAINAGE DEVICE (ISTENT) Left 02/02/2019   Procedure: INSERTION OF ANTERIOR SEGMENT AQUEOUS DRAINAGE DEVICE (ISTENT);  Surgeon: Nevada Crane, MD;  Location: [facility];  Service: Ophthalmology;  Laterality: Left;   SAVORY DILATION N/A 10/15/2014   Procedure: SAVORY DILATION;  Surgeon: Scot Jun, MD;  Location: [facility] ENDOSCOPY;  Service: Endoscopy;  Laterality: N/A;    Prior to Admission medications   Medication Sig Start Date End Date Taking? Authorizing Provider  apixaban (ELIQUIS) 5 MG TABS tablet Take 1 tablet (5 mg total) by mouth 2 (two) times daily. 03/12/19  Yes Delfino Lovett, MD  atorvastatin (LIPITOR) 20 MG tablet Take 20 mg by mouth daily.   Yes [provider]  carbidopa-levodopa (SINEMET CR) 50-200 MG tablet Take 1 tablet by mouth  at bedtime.   Yes [provider]  carbidopa-levodopa (SINEMET IR) 25-100 MG tablet Take 2 tablets by mouth 4 (four) times daily.   Yes [provider]  carvedilol (COREG) 25 MG tablet Take 25 mg by mouth 2 (two) times daily.   Yes [provider]  diltiazem (CARDIZEM CD) 120 MG 24 hr capsule Take 1 capsule (120 mg total) by mouth daily. 03/12/19  Yes Delfino Lovett, MD  furosemide (LASIX) 20 MG tablet Take 20 mg by mouth  daily.   Yes [provider]  losartan (COZAAR) 25 MG tablet Take 25 mg by mouth daily.   Yes [provider]  rOPINIRole (REQUIP) 0.25 MG tablet Take 0.25-0.5 mg by mouth at bedtime.   Yes [provider]  tamsulosin (FLOMAX) 0.4 MG CAPS capsule Take 1 capsule (0.4 mg total) by mouth daily. 09/12/20  Yes Stoioff, Verna Czech, MD    Allergies Pneumococcal vaccines  No family history on file.  Social History Social History   Tobacco Use   Smoking status: Former    Types: Cigarettes    Quit date: 1975    Years since quitting: 47.6   Smokeless tobacco: Never  Vaping Use   Vaping Use: Never used  Substance Use Topics   Alcohol use: No   Drug use: Not Currently    Types: Marijuana    Comment: none since 2019    Review of Systems  Constitutional: No fever/chills Eyes: No visual changes. ENT: No sore throat. Cardiovascular: Denies chest pain. Respiratory: Denies shortness of breath. Gastrointestinal: No abdominal pain.  No nausea, no vomiting.  No diarrhea.  No constipation. Genitourinary: Negative for dysuria. Musculoskeletal: Negative for back pain. Skin: Negative for rash. Neurological: Negative for headaches, focal weakness or numbness.  ____________________________________________   PHYSICAL EXAM:  VITAL SIGNS: ED Triage Vitals [12/22/20 1529]  Enc Vitals Group     BP      Pulse      Resp      Temp      Temp src      SpO2      Weight (!) 362 lb 14 oz (164.6 kg)     Height 5\' 9"  (1.753 m)     Head Circumference      Peak Flow      Pain Score 0     Pain Loc      Pain Edu?      Excl. in [place]?     Constitutional: Alert and oriented.  no acute distress. Eyes: Conjunctivae are normal.  Head: Atraumatic. Nose: No congestion/rhinnorhea. Mouth/Throat: Mucous membranes are moist.  Oropharynx non-erythematous. Neck: No stridor.   Hematological/Lymphatic/Immunilogical: No cervical lymphadenopathy. Cardiovascular: Irregular rate and rhythm.  Grossly normal heart sounds.  Good peripheral circulation. Respiratory: Normal respiratory effort.  No retractions. Lungs CTAB. Gastrointestinal: Soft and nontender. No distention. No abdominal bruits. Genitourinary:  Musculoskeletal: No lower extremity tenderness nor edema.  No joint effusions. Neurologic:  Normal speech and language. No gross focal neurologic deficits are appreciated. No gait instability. Skin:  Skin is warm, dry and intact. No rash noted. Psychiatric: Mood and affect are normal. Speech and behavior are normal.  ____________________________________________   LABS (all labs ordered are listed, but only abnormal results are displayed)  Labs Reviewed  COMPREHENSIVE METABOLIC PANEL - Abnormal; Notable for the following components:      Result Value   Sodium 134 (*)    BUN 26 (*)    Calcium 8.6 (*)    Total  Protein 6.1 (*)    Total Bilirubin 3.6 (*)    All other components within normal limits  CBC WITH DIFFERENTIAL/PLATELET - Abnormal; Notable for the following components:   RBC 3.68 (*)    Hemoglobin 12.1 (*)    HCT 34.9 (*)    Platelets 135 (*)    All other components within normal limits  URINALYSIS, COMPLETE (UACMP) WITH MICROSCOPIC  TROPONIN I (HIGH SENSITIVITY)   ____________________________________________  EKG  ED ECG REPORT I, Shatha Hooser, FNP-BC personally viewed and interpreted this ECG.   Date: 12/22/2020  EKG Time: 1537  Rate: 79  Rhythm: atrial fibrillation, rate controlled  Axis: Normal  Intervals:none  ST&T Change: no ST elevation  ____________________________________________  RADIOLOGY  ED MD interpretation:    Chest x-ray without acute concerns.   I, Kem Boroughs, personally viewed and evaluated these images (plain radiographs) as part of my medical decision making, as well as reviewing the written report by the radiologist.  Official radiology report(s): DG Chest 1 View  Result Date: 12/22/2020 CLINICAL DATA:  Syncope,  weakness EXAM: CHEST  1 VIEW COMPARISON:  06/26/2020 FINDINGS: Normal cardiac heart size. Aortic atherosclerotic calcifications. Unchanged mediastinal contours with aortic tortuosity. No focal consolidative opacity. No pleural effusion or pneumothorax. Imaged abdomen is unremarkable. No acute abnormality. IMPRESSION: No acute cardiopulmonary process. Electronically Signed   By: Wiliam Ke MD   On: 12/22/2020 16:31   CT HEAD WO CONTRAST ( )  Result Date: 12/22/2020 CLINICAL DATA:  Dizziness, nonspecific.  Hypotension. EXAM: CT HEAD WITHOUT CONTRAST TECHNIQUE: Contiguous axial images were obtained from the base of the skull through the vertex without intravenous contrast. COMPARISON:  04/17/2019 FINDINGS: Brain: No focal abnormality affects the brainstem or cerebellum. Cerebral hemispheres show generalized atrophy. 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 Chronic small-vessel ischemic changes affect the deep white matter. No sign of acute infarction, mass lesion, hemorrhage, hydrocephalus or extra-axial collection. Vascular: There is atherosclerotic calcification of the major vessels at the base of the brain. Skull: Negative Sinuses/Orbits: Clear/normal Other: None IMPRESSION: Generalized atrophy. 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 Chronic small-vessel ischemic changes of the white matter. No acute finding by CT. Electronically Signed   By: Paulina Fusi M.D.   On: 12/22/2020 19:16    ____________________________________________   PROCEDURES  Procedure(s) performed (including Critical Care):  Procedures  ____________________________________________   INITIAL IMPRESSION / ASSESSMENT AND PLAN     87 year old male presenting to the emergency department for treatment and evaluation of hypotension and dizziness.  See HPI for further details.  EKG shows rate controlled A. fib.  It was not initially apparent that patient has a history of A. fib, however review of cardiology note from  03/11/2019 mentions change from amlodipine to diltiazem for heart rate control of atrial fibrillation with RVR.  DIFFERENTIAL DIAGNOSIS  Cardiac event, vagal nerve stimulation,   ED COURSE  Patient tells me that he lives alone. When PT came to see him today, they checked his blood pressure and found him to be hypotensive. He did not sustain a fall or injury.   He tells me that his blood pressure varies and it is not uncommon for "the top to be under 100."   Patient stable for discharge. He has not had any hypotensive episodes while here.  Patient requesting urinalysis prior to being discharged home.   Care relinquished to Dr. Vicente Males who will follow up on urinalysis and disopsition.   ___________________________________________   FINAL CLINICAL IMPRESSION(S) / ED DIAGNOSES  Final  diagnoses:  Dizziness of unknown etiology     ED Discharge Orders     None        Scott Cortez was evaluated in Emergency Department on 12/22/2020 for the symptoms described in the history of present illness. He was evaluated in the context of the global COVID-19 pandemic, which necessitated consideration that the patient might be at risk for infection with the SARS-CoV-2 virus that causes COVID-19. Institutional protocols and algorithms that pertain to the evaluation of patients at risk for COVID-19 are in a state of rapid change based on information released by regulatory bodies including the CDC and federal and state organizations. These policies and algorithms were followed during the patient's care in the ED.   Note:  This document was prepared using Dragon voice recognition software and may include unintentional dictation errors.    Chinita Pesterriplett, Acelyn Basham B, FNP 12/22/20 2029    Merwyn KatosBradler, Evan K, MD 12/22/20 2352

## 2020-12-22 NOTE — ED Notes (Signed)
Male purewick applied at this time

## 2020-12-22 NOTE — ED Triage Notes (Signed)
Patient to ED via [facility] from home for hypotension and dizziness. Patient noted to be in Afib by EMS. Pt states recent change in medications recently but unsure of which ones. Patient alert and oriented at this time.

## 2021-03-29 ENCOUNTER — Ambulatory Visit: Payer: Medicare Other | Admitting: Urology

## 2021-04-13 ENCOUNTER — Ambulatory Visit: Payer: Medicare Other | Admitting: Urology

## 2021-05-05 ENCOUNTER — Encounter: Payer: Self-pay | Admitting: Emergency Medicine

## 2021-05-05 ENCOUNTER — Other Ambulatory Visit: Payer: Self-pay

## 2021-05-05 ENCOUNTER — Emergency Department: Payer: Medicare Other

## 2021-05-05 ENCOUNTER — Emergency Department
Admission: EM | Admit: 2021-05-05 | Discharge: 2021-05-06 | Disposition: A | Discharge status: Home / Self Care | Payer: Medicare Other | Attending: Emergency Medicine | Admitting: Emergency Medicine

## 2021-05-05 DIAGNOSIS — I110 Hypertensive heart disease with heart failure: Secondary | ICD-10-CM | POA: Insufficient documentation

## 2021-05-05 DIAGNOSIS — K5790 Diverticulosis of intestine, part unspecified, without perforation or abscess without bleeding: Secondary | ICD-10-CM | POA: Insufficient documentation

## 2021-05-05 DIAGNOSIS — R1031 Right lower quadrant pain: Secondary | ICD-10-CM | POA: Diagnosis present

## 2021-05-05 DIAGNOSIS — Z7901 Long term (current) use of anticoagulants: Secondary | ICD-10-CM | POA: Insufficient documentation

## 2021-05-05 DIAGNOSIS — R350 Frequency of micturition: Secondary | ICD-10-CM | POA: Diagnosis not present

## 2021-05-05 DIAGNOSIS — Z87891 Personal history of nicotine dependence: Secondary | ICD-10-CM | POA: Diagnosis not present

## 2021-05-05 DIAGNOSIS — I509 Heart failure, unspecified: Secondary | ICD-10-CM | POA: Diagnosis not present

## 2021-05-05 DIAGNOSIS — I2510 Atherosclerotic heart disease of native coronary artery without angina pectoris: Secondary | ICD-10-CM | POA: Diagnosis not present

## 2021-05-05 LAB — COMPREHENSIVE METABOLIC PANEL
ALT: 9 U/L (ref 0–44)
AST: 20 U/L (ref 15–41)
Albumin: 4 g/dL (ref 3.5–5.0)
Alkaline Phosphatase: 77 U/L (ref 38–126)
Anion gap: 6 (ref 5–15)
BUN: 17 mg/dL (ref 8–23)
CO2: 27 mmol/L (ref 22–32)
Calcium: 9.4 mg/dL (ref 8.9–10.3)
Chloride: 100 mmol/L (ref 98–111)
Creatinine, Ser: 0.75 mg/dL (ref 0.61–1.24)
GFR, Estimated: >60 mL/min (ref >60)
Glucose, Bld: 106 mg/dL — ABNORMAL HIGH (ref 70–99)
Potassium: 3.9 mmol/L (ref 3.5–5.1)
Sodium: 133 mmol/L — ABNORMAL LOW (ref 135–145)
Total Bilirubin: 2.1 mg/dL — ABNORMAL HIGH (ref 0.3–1.2)
Total Protein: 6.5 g/dL (ref 6.5–8.1)

## 2021-05-05 LAB — URINALYSIS, COMPLETE (UACMP) WITH MICROSCOPIC
Bacteria, UA: NONE SEEN
Bilirubin Urine: NEGATIVE
Glucose, UA: NEGATIVE mg/dL
Ketones, ur: NEGATIVE mg/dL
Leukocytes,Ua: NEGATIVE
Nitrite: NEGATIVE
Protein, ur: NEGATIVE mg/dL
Specific Gravity, Urine: 1.01 (ref 1.005–1.030)
pH: 5 (ref 5.0–8.0)

## 2021-05-05 MED ORDER — IOHEXOL 300 MG/ML  SOLN
80.0000 mL | Freq: Once | INTRAMUSCULAR | Status: AC | PRN
  Administered 2021-05-05: 23:00:00 80 mL via INTRAVENOUS
  Filled 2021-05-05: qty 80

## 2021-05-05 NOTE — ED Provider Notes (Signed)
Emergency Medicine Provider Triage Evaluation Note  Scott Cortez , a 87 y.o. male  was evaluated in triage.  Pt complains of right groin pain x 4 days. Pain is intermittent, does not radiate. Worse with certain movements. Pt reports urinary frequency, denies urgency, dysuria, blood in his urine, penile discharge, testicular pain or swelling, abdominal pain, nausea, vomiting, diarrhea or constipation.  Review of Systems  Positive: Right groin pain, urinary frequency Negative: Decreased appetite, nausea, vomiting, diarrhea, constipation, urinary urgency, dysuria, blood in his urine, penile discharge, testicular pain, swelling  Physical Exam  BP (!) 132/93 (BP Location: Left Arm)    Pulse 82    Temp 97.9 F (36.6 C) (Oral)    Resp 20    Ht 5\' 8"  (1.727 m)    Wt 68 kg    SpO2 96%    BMI 22.81 kg/m  Gen:   Awake, no distress   Resp:  Normal effort  Abd:  Nontender in RLQ MSK:  Pain with palpation in the right inguinal area, no swelling or mass noted.  Medical Decision Making  Medically screening exam initiated at 5:38 PM.  Appropriate orders placed.  was informed that the remainder of the evaluation will be completed by another provider, this initial triage assessment does not replace that evaluation, and the importance of remaining in the ED until their evaluation is complete.  Right Inguinal Pain, Urinary Frequency:  Urinalysis     Johnnette Gourd, NP 05/05/21 1741    05/07/21, MD 05/05/21 [phone]

## 2021-05-05 NOTE — ED Triage Notes (Signed)
Pt reports pain to right groin and urinary frequency and urgency today.

## 2021-05-05 NOTE — ED Provider Notes (Signed)
[facility] Emergency Department Provider Note ____________________________________________  Time seen: 2100  I have reviewed the triage vital signs and the nursing notes.  HISTORY  Chief Complaint  Groin Pain and Urinary Frequency   HPI Scott Cortez is a 87 y.o. male presents to the ER today with complaint of right groin pain.  He reports this started 4 days ago.  He describes the pain as sharp.  The pain is worse with certain movements.  The pain does not radiate.  He does report some urinary frequency but denies urgency, dysuria, blood in his urine, testicular pain or swelling.  He denies decreased appetite, nausea, reflux, vomiting, diarrhea, constipation or blood in his stool.  He denies any injury to the area but thinks he may have strained it.  He has not taken any medications OTC for this.  He does have a history of neuropathy but reports this feels different.  He does have a history of Parkinson's and does feel like he is having more difficulty caring for himself at home.  He currently lives alone.  He is having some difficulty ambulating.  He reports he has no help from family or friends at this time.  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   Incomplete emptying of bladder 01/26/2014   Increased frequency of urination 01/26/2014   Slowing of urinary stream 01/26/2014    Past Surgical  History:  Procedure Laterality Date   CATARACT EXTRACTION W/PHACO Left 02/02/2019   Procedure: CATARACT EXTRACTION PHACO AND INTRAOCULAR LENS PLACEMENT (IOC) LEFT ISTENT INJ AND ISTENT 01:09.1        11.3%          8.47;  Surgeon: Nevada Crane, MD;  Location: [facility];  Service: Ophthalmology;  Laterality: Left;   CATARACT EXTRACTION W/PHACO Right 02/23/2019   Procedure: CATARACT EXTRACTION PHACO AND INTRAOCULAR LENS PLACEMENT (IOC) RIGHT ISTENT INJ 01:09.1  14.1%  9.78;  Surgeon: Nevada Crane, MD;  Location: [facility];  Service: Ophthalmology;  Laterality: Right;   COLONOSCOPY N/A 10/15/2014   Procedure: COLONOSCOPY;  Surgeon: Scot Jun, MD;  Location: [facility] ENDOSCOPY;  Service: Endoscopy;  Laterality: N/A;   CORONARY ANGIOPLASTY     3 stents   coronary stenting     2007 (2), 2008 (1)   ESOPHAGOGASTRODUODENOSCOPY (EGD) WITH PROPOFOL N/A 10/15/2014   Procedure: ESOPHAGOGASTRODUODENOSCOPY (EGD) WITH PROPOFOL;  Surgeon: Scot Jun, MD;  Location: [facility] ENDOSCOPY;  Service: Endoscopy;  Laterality: N/A;   INSERTION OF ANTERIOR SEGMENT AQUEOUS DRAINAGE DEVICE (ISTENT) Left 02/02/2019   Procedure: INSERTION OF ANTERIOR SEGMENT AQUEOUS DRAINAGE DEVICE (ISTENT);  Surgeon: Nevada Crane, MD;  Location: [facility];  Service: Ophthalmology;  Laterality: Left;   SAVORY DILATION N/A 10/15/2014   Procedure: SAVORY DILATION;  Surgeon: Scot Jun, MD;  Location: [facility]  ENDOSCOPY;  Service: Endoscopy;  Laterality: N/A;    Prior to Admission medications   Medication Sig Start Date End Date Taking? Authorizing Provider  apixaban (ELIQUIS) 5 MG TABS tablet Take 1 tablet (5 mg total) by mouth 2 (two) times daily. 03/12/19   Max Sane, MD  atorvastatin (LIPITOR) 20 MG tablet Take 20 mg by mouth daily.    [provider]  carbidopa-levodopa (SINEMET CR) 50-200 MG tablet Take 1 tablet by mouth at bedtime.    [provider]   carbidopa-levodopa (SINEMET IR) 25-100 MG tablet Take 2 tablets by mouth 4 (four) times daily.    [provider]  carvedilol (COREG) 25 MG tablet Take 25 mg by mouth 2 (two) times daily.    [provider]  diltiazem (CARDIZEM CD) 120 MG 24 hr capsule Take 1 capsule (120 mg total) by mouth daily. 03/12/19   Max Sane, MD  furosemide (LASIX) 20 MG tablet Take 20 mg by mouth daily.    [provider]  losartan (COZAAR) 25 MG tablet Take 25 mg by mouth daily.    [provider]  rOPINIRole (REQUIP) 0.25 MG tablet Take 0.25-0.5 mg by mouth at bedtime.    [provider]  tamsulosin (FLOMAX) 0.4 MG CAPS capsule Take 1 capsule (0.4 mg total) by mouth daily. 09/12/20   Abbie Sons, MD    Allergies Pneumococcal vaccines  No family history on file.  Social History Social History   Tobacco Use   Smoking status: Former    Types: Cigarettes    Quit date: 1975    Years since quitting: 48.0   Smokeless tobacco: Never  Vaping Use   Vaping Use: Never used  Substance Use Topics   Alcohol use: No   Drug use: Not Currently    Types: Marijuana    Comment: none since 2019    Review of Systems  Constitutional: Negative for fever, chills or body aches. Cardiovascular: Negative for chest pain or chest tightness. Respiratory: Negative for cough or shortness of breath. Gastrointestinal: Negative for abdominal pain, nausea, vomiting, constipation, diarrhea or blood in the stool. Genitourinary: Positive for urinary frequency.  Negative for urgency, dysuria, blood in his urine, testicular pain or swelling. Musculoskeletal: Positive for right groin pain.  Negative for hip or knee pain. Skin: Negative for rash. Neurological: Negative for headaches,, dizziness, focal weakness, tingling or numbness. ____________________________________________  PHYSICAL EXAM:  VITAL SIGNS: ED Triage Vitals  Enc Vitals Group     BP 05/05/21 1737 (!) 132/93      Pulse Rate 05/05/21 1737 82     Resp 05/05/21 1737 20     Temp 05/05/21 1737 97.9 F (36.6 C)     Temp Source 05/05/21 1737 Oral     SpO2 05/05/21 1737 96 %     Weight 05/05/21 1736 150 lb (68 kg)     Height 05/05/21 1736 5\' 8"  (1.727 m)     Head Circumference --      Peak Flow --      Pain Score 05/05/21 1736 4     Pain Loc --      Pain Edu? --      Excl. in [place]? --     Constitutional: Alert and oriented. Well appearing and in no distress. Head: Normocephalic Cardiovascular: Normal rate, irregular rhythm.  Pedal pulse 2+ on the right. Respiratory: Normal respiratory effort. No wheezes/rales/rhonchi. Gastrointestinal: Soft and nontender. No distention or masses. Musculoskeletal: Pain with palpation in the right inguinal  area.  No mass noted.  Normal abduction, abduction, internal and external rotation of the right hip.  No pain with palpation of the right hip.  Strength equal BLE. Bradyckinesia noted. Neurologic:  Normal speech and language. No gross focal neurologic deficits are appreciated.No resting tremor noted. Skin:  Skin is warm, dry and intact. No rash noted.  ____________________________________________   LABS Labs Reviewed  URINALYSIS, COMPLETE (UACMP) WITH MICROSCOPIC - Abnormal; Notable for the following components:      Result Value   Hgb urine dipstick TRACE (*)    All other components within normal limits  COMPREHENSIVE METABOLIC PANEL - Abnormal; Notable for the following components:   Sodium 133 (*)    Glucose, Bld 106 (*)    Total Bilirubin 2.1 (*)    All other components within normal limits    ____________________________________________   RADIOLOGY Imaging Orders         CT ABDOMEN PELVIS W CONTRAST     ____________________________________________  INITIAL IMPRESSION / ASSESSMENT AND PLAN / ED COURSE  Right Inguinal Pain, Urinary Frequency:  Urinalysis: trace blood BMET unremarkable CT abd/pelvis pending Major concern at this point would him  living alone at his age with parkinsonism.  He is having admittedly more difficulty taking care of himself.  He lacks supportive family and friends.  He has said that he would like to return home if possible but will not have a ride this evening. His brother called the front desk of the ER and advised secretary that he would not be able to come pick him up tonight.  Handoff given to Dr. Cheri Fowler at 2245    ____________________________________________  FINAL CLINICAL IMPRESSION(S) / ED DIAGNOSES  Final diagnoses:  None      Jearld Fenton, NP 05/05/21 2244    Nance Pear, MD 05/05/21 2303

## 2021-05-05 NOTE — ED Triage Notes (Signed)
Pt in via EMS from home with c/o pelvic/groin pain to the right side. Pt reports pain not radiating and is stabbing in nature. 138/90, HR 72, 99% RA

## 2021-05-06 NOTE — ED Notes (Signed)
Pt verbalized understanding of discharge instructions. Pt advised if symptoms worsen to return to ED. Pt denies pain at this time. Family came to pick up pt at discharge. E-signature not available at this time.

## 2021-05-06 NOTE — ED Notes (Signed)
Pt resting in bed. NAD noted.

## 2021-05-06 NOTE — Discharge Instructions (Addendum)
Your lab tests and CT scan were all okay today.  Please make an appointment with [facility] vascular surgery for routine follow-up of your aortic dissection that they are monitoring.

## 2021-05-06 NOTE — ED Provider Notes (Signed)
[facility] Emergency Department Provider Note  ____________________________________________  Time seen: Approximately 12:56 AM  I have reviewed the triage vital signs and the nursing notes.   HISTORY  Chief Complaint Groin Pain and Urinary Frequency    HPI Scott Cortez is a 87 y.o. male with a history of CHF chronic pancreatitis GERD hypertension neuropathy who comes ED complaining of right lower quadrant abdominal pain associated with urinary frequency and urgency which started earlier in the day on December 23.  Not radiating.  Sharp, intermittent, no aggravating or alleviating factors.  No vomiting or diarrhea or constipation.  No fever or chills, no chest pain or shortness of breath.  Feels severe when present.    Past Medical History:  Diagnosis Date   Bladder neck obstruction    Bradycardia    CHF (congestive heart failure) (HCC)    Chronic pancreatitis (HCC)    Coronary artery disease    Dental bridge present    upper - bilateral   GERD (gastroesophageal reflux disease)    Glaucoma    Hyperlipidemia    Hypertension    Myocardial infarction (HCC)    Neuropathy    feet     Patient Active Problem List   Diagnosis Date Noted   Near syncope 03/07/2019   Nocturia 06/26/2016   Lower abdominal pain 09/04/2015   Erectile dysfunction 08/30/2014   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   Incomplete emptying of bladder 01/26/2014   Increased frequency of urination 01/26/2014   Slowing of urinary stream 01/26/2014     Past Surgical History:  Procedure Laterality Date   CATARACT EXTRACTION W/PHACO Left 02/02/2019   Procedure: CATARACT EXTRACTION PHACO AND INTRAOCULAR LENS PLACEMENT (IOC) LEFT ISTENT INJ AND ISTENT 01:09.1        11.3%          8.47;  Surgeon: Nevada Crane, MD;  Location: [facility];  Service: Ophthalmology;  Laterality: Left;   CATARACT EXTRACTION W/PHACO Right 02/23/2019   Procedure:  CATARACT EXTRACTION PHACO AND INTRAOCULAR LENS PLACEMENT (IOC) RIGHT ISTENT INJ 01:09.1  14.1%  9.78;  Surgeon: Nevada Crane, MD;  Location: [facility];  Service: Ophthalmology;  Laterality: Right;   COLONOSCOPY N/A 10/15/2014   Procedure: COLONOSCOPY;  Surgeon: Scot Jun, MD;  Location: [facility] ENDOSCOPY;  Service: Endoscopy;  Laterality: N/A;   CORONARY ANGIOPLASTY     3 stents   coronary stenting     2007 (2), 2008 (1)   ESOPHAGOGASTRODUODENOSCOPY (EGD) WITH PROPOFOL N/A 10/15/2014   Procedure: ESOPHAGOGASTRODUODENOSCOPY (EGD) WITH PROPOFOL;  Surgeon: Scot Jun, MD;  Location: [facility] ENDOSCOPY;  Service: Endoscopy;  Laterality: N/A;   INSERTION OF ANTERIOR SEGMENT AQUEOUS DRAINAGE DEVICE (ISTENT) Left 02/02/2019   Procedure: INSERTION OF ANTERIOR SEGMENT AQUEOUS DRAINAGE DEVICE (ISTENT);  Surgeon: Nevada Crane, MD;  Location: [facility];  Service: Ophthalmology;  Laterality: Left;   SAVORY DILATION N/A 10/15/2014   Procedure: SAVORY DILATION;  Surgeon: Scot Jun, MD;  Location: [facility] ENDOSCOPY;  Service: Endoscopy;  Laterality: N/A;     Prior to Admission medications   Medication Sig Start Date End Date Taking? Authorizing Provider  apixaban (ELIQUIS) 5 MG TABS tablet Take 1 tablet (5 mg total) by mouth 2 (two) times daily. 03/12/19   Delfino Lovett, MD  atorvastatin (LIPITOR) 20 MG tablet Take 20 mg by mouth daily.    [provider]  carbidopa-levodopa (SINEMET CR) 50-200 MG tablet Take 1 tablet by mouth at  bedtime.    [provider]  carbidopa-levodopa (SINEMET IR) 25-100 MG tablet Take 2 tablets by mouth 4 (four) times daily.    [provider]  carvedilol (COREG) 25 MG tablet Take 25 mg by mouth 2 (two) times daily.    [provider]  diltiazem (CARDIZEM CD) 120 MG 24 hr capsule Take 1 capsule (120 mg total) by mouth daily. 03/12/19   Delfino Lovett, MD  furosemide (LASIX) 20 MG tablet Take 20 mg by mouth daily.     [provider]  losartan (COZAAR) 25 MG tablet Take 25 mg by mouth daily.    [provider]  rOPINIRole (REQUIP) 0.25 MG tablet Take 0.25-0.5 mg by mouth at bedtime.    [provider]  tamsulosin (FLOMAX) 0.4 MG CAPS capsule Take 1 capsule (0.4 mg total) by mouth daily. 09/12/20   [name], MD     Allergies Pneumococcal vaccines   No family history on file.  Social History Social History   Tobacco Use   Smoking status: Former    Types: Cigarettes    Quit date: 1975    Years since quitting: 48.0   Smokeless tobacco: Never  Vaping Use   Vaping Use: Never used  Substance Use Topics   Alcohol use: No   Drug use: Not Currently    Types: Marijuana    Comment: none since 2019    Review of Systems  Constitutional:   No fever or chills.  ENT:   No sore throat. No rhinorrhea. Cardiovascular:   No chest pain or syncope. Respiratory:   No dyspnea or cough. Gastrointestinal:   Positive as above for abdominal pain without vomiting and diarrhea.  Musculoskeletal:   Negative for focal pain or swelling All other systems reviewed and are negative except as documented above in ROS and HPI.  ____________________________________________   PHYSICAL EXAM:  VITAL SIGNS: ED Triage Vitals  Enc Vitals Group     BP 05/05/21 1737 (!) 132/93     Pulse Rate 05/05/21 1737 82     Resp 05/05/21 1737 20     Temp 05/05/21 1737 97.9 F (36.6 C)     Temp Source 05/05/21 1737 Oral     SpO2 05/05/21 1737 96 %     Weight 05/05/21 1736 150 lb (68 kg)     Height 05/05/21 1736 5\' 8"  (1.727 m)     Head Circumference --      Peak Flow --      Pain Score 05/05/21 1736 4     Pain Loc --      Pain Edu? --      Excl. in [place]? --     Vital signs reviewed, nursing assessments reviewed.   Constitutional:   Alert and oriented. Non-toxic appearance. Eyes:   Conjunctivae are normal. EOMI. PERRL. ENT      Head:   Normocephalic and atraumatic.      Nose:   Wearing a  mask.      Mouth/Throat:   Wearing a mask.      Neck:   No meningismus. Full ROM. Hematological/Lymphatic/Immunilogical:   No cervical or inguinal lymphadenopathy. Cardiovascular:   RRR. Symmetric bilateral radial and DP pulses.  No murmurs. Cap refill less than 2 seconds. Respiratory:   Normal respiratory effort without tachypnea/retractions. Breath sounds are clear and equal bilaterally. No wheezes/rales/rhonchi. Gastrointestinal:   Soft and nontender. Non distended. There is no CVA tenderness.  No rebound, rigidity, or guarding. Genitourinary:   deferred  Musculoskeletal:   Normal range of motion in all extremities. No joint effusions.  No lower extremity tenderness.  No edema.  Mild tenderness over the lateral aspect of the right inguinal ligament which reproduces the patient's pain.  There is no hernia or wound or soft tissue inflammatory changes Neurologic:   Normal speech and language.  Motor grossly intact. No acute focal neurologic deficits are appreciated.  Skin:    Skin is warm, dry and intact. No rash noted.  No petechiae, purpura, or bullae.  ____________________________________________    LABS (pertinent positives/negatives) (all labs ordered are listed, but only abnormal results are displayed) Labs Reviewed  URINALYSIS, COMPLETE (UACMP) WITH MICROSCOPIC - Abnormal; Notable for the following components:      Result Value   Hgb urine dipstick TRACE (*)    All other components within normal limits  COMPREHENSIVE METABOLIC PANEL - Abnormal; Notable for the following components:   Sodium 133 (*)    Glucose, Bld 106 (*)    Total Bilirubin 2.1 (*)    All other components within normal limits   ____________________________________________   EKG    ____________________________________________    RADIOLOGY  CT ABDOMEN PELVIS W CONTRAST  Result Date: 05/05/2021 CLINICAL DATA:  Right lower quadrant pain, initial encounter EXAM: CT ABDOMEN AND PELVIS WITH CONTRAST  TECHNIQUE: Multidetector CT imaging of the abdomen and pelvis was performed using the standard protocol following bolus administration of intravenous contrast. CONTRAST:  41mL OMNIPAQUE IOHEXOL 300 MG/ML  SOLN COMPARISON:  01/09/2016 FINDINGS: Lower chest: Lungs are well aerated bilaterally. No focal infiltrate or sizable effusion is seen. Hepatobiliary: No focal liver abnormality is seen. No gallstones, gallbladder wall thickening, or biliary dilatation. Pancreas: Unremarkable. No pancreatic ductal dilatation or surrounding inflammatory changes. Spleen: Normal in size without focal abnormality. Adrenals/Urinary Tract: Adrenal glands are within normal limits bilaterally. No renal calculi or obstructive changes are seen. Normal enhancement of the kidneys is seen bilaterally. Normal excretion is seen bilaterally as well. Bladder is within normal limits. Stomach/Bowel: Scattered diverticular changes noted without evidence of diverticulitis. No obstructive or inflammatory changes are seen. Mild retained colonic fecal material is noted. The appendix is within normal limits. Small bowel and stomach are unremarkable. Vascular/Lymphatic: Atherosclerotic calcifications of the abdominal aorta are noted. Mild ectasia to 2.8 cm is noted. There are changes consistent with a prior dissection with patency of the false lumen. This has progressed somewhat in the interval from the prior exam. Diffuse atherosclerotic calcifications of the iliac vessels are seen as well. Reproductive: Prostate is unremarkable. Other: No abdominal wall hernia or abnormality. No abdominopelvic ascites. Musculoskeletal: Degenerative changes of lumbar spine are noted. IMPRESSION: Normal-appearing appendix. No discrete abnormality in the right lower quadrant is identified to correspond with the given clinical history. Ectasias of the abdominal aorta to 2.8 cm. 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 This has  progressed in size somewhat when compared with the prior exam of 2017. No acute abnormality is identified. Diverticulosis without diverticulitis. Electronically Signed   By: Alcide Clever M.D.   On: 05/05/2021 22:53    ____________________________________________   PROCEDURES Procedures  ____________________________________________  DIFFERENTIAL DIAGNOSIS   Appendicitis, cystitis, diverticulitis, ureterolithiasis, hernia  CLINICAL IMPRESSION / ASSESSMENT AND PLAN / ED COURSE  Medications ordered in the ED: Medications  iohexol (OMNIPAQUE) 300 MG/ML solution 80 mL (80 mLs Intravenous Contrast Given 05/05/21 2233)    Pertinent labs & imaging results that were available during my care of the patient were reviewed  by me and considered in my medical decision making (see chart for details).  Scott Cortez was evaluated in Emergency Department on 05/06/2021 for the symptoms described in the history of present illness. He was evaluated in the context of the global COVID-19 pandemic, which necessitated consideration that the patient might be at risk for infection with the SARS-CoV-2 virus that causes COVID-19. Institutional protocols and algorithms that pertain to the evaluation of patients at risk for COVID-19 are in a state of rapid change based on information released by regulatory bodies including the CDC and federal and state organizations. These policies and algorithms were followed during the patient's care in the ED.   Patient presents with right lower quadrant pain, found to have what appears to be muscular pain at the anterior aspect of the right hip.  Labs and CT scan are all unremarkable.  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  Patient notes that he is seeing [facility] vascular surgery for this in the past and they have advised him that it is not requiring any treatment.  He denies any new symptoms in that regard, no leg weakness or paresthesia, no radiating  pain.  Will provide in the [facility] vascular surgery clinic information.  Recommend close follow-up with his PCP.  Urinalysis appears normal. Considering the patient's symptoms, medical history, and physical examination today, I have low suspicion for cholecystitis or biliary pathology, pancreatitis, perforation or bowel obstruction, hernia, intra-abdominal abscess, AAA or dissection, volvulus or intussusception, mesenteric ischemia, or appendicitis.      ____________________________________________   FINAL CLINICAL IMPRESSION(S) / ED DIAGNOSES    Final diagnoses:  Urinary frequency     ED Discharge Orders     None       Portions of this note were generated with dragon dictation software. Dictation errors may occur despite best attempts at proofreading.    Sharman Cheek, MD 05/06/21 [phone]

## 2021-05-06 NOTE — ED Notes (Addendum)
Pt wet themselves. Pt cleaned up. New sheets and bed chucks. New brief, pants, shirt put on pt. Warm blankets given. Pt resting in bed at this time.

## 2021-05-10 ENCOUNTER — Other Ambulatory Visit: Payer: Self-pay

## 2021-05-10 ENCOUNTER — Emergency Department: Payer: Medicare Other

## 2021-05-10 ENCOUNTER — Emergency Department
Admission: EM | Admit: 2021-05-10 | Discharge: 2021-05-10 | Disposition: A | Discharge status: Home / Self Care | Payer: Medicare Other | Attending: Emergency Medicine | Admitting: Emergency Medicine

## 2021-05-10 DIAGNOSIS — Z87891 Personal history of nicotine dependence: Secondary | ICD-10-CM | POA: Insufficient documentation

## 2021-05-10 DIAGNOSIS — I2510 Atherosclerotic heart disease of native coronary artery without angina pectoris: Secondary | ICD-10-CM | POA: Insufficient documentation

## 2021-05-10 DIAGNOSIS — I509 Heart failure, unspecified: Secondary | ICD-10-CM | POA: Diagnosis not present

## 2021-05-10 DIAGNOSIS — Z7901 Long term (current) use of anticoagulants: Secondary | ICD-10-CM | POA: Insufficient documentation

## 2021-05-10 DIAGNOSIS — I110 Hypertensive heart disease with heart failure: Secondary | ICD-10-CM | POA: Diagnosis not present

## 2021-05-10 DIAGNOSIS — R102 Pelvic and perineal pain: Secondary | ICD-10-CM | POA: Insufficient documentation

## 2021-05-10 DIAGNOSIS — Z79899 Other long term (current) drug therapy: Secondary | ICD-10-CM | POA: Diagnosis not present

## 2021-05-10 DIAGNOSIS — N50819 Testicular pain, unspecified: Secondary | ICD-10-CM

## 2021-05-10 LAB — BASIC METABOLIC PANEL
Anion gap: 6 (ref 5–15)
BUN: 24 mg/dL — ABNORMAL HIGH (ref 8–23)
CO2: 29 mmol/L (ref 22–32)
Calcium: 9.3 mg/dL (ref 8.9–10.3)
Chloride: 100 mmol/L (ref 98–111)
Creatinine, Ser: 0.76 mg/dL (ref 0.61–1.24)
GFR, Estimated: >60 mL/min (ref >60)
Glucose, Bld: 109 mg/dL — ABNORMAL HIGH (ref 70–99)
Potassium: 3.7 mmol/L (ref 3.5–5.1)
Sodium: 135 mmol/L (ref 135–145)

## 2021-05-10 LAB — URINALYSIS, ROUTINE W REFLEX MICROSCOPIC
Bilirubin Urine: NEGATIVE
Glucose, UA: NEGATIVE mg/dL
Hgb urine dipstick: NEGATIVE
Ketones, ur: 5 mg/dL — AB
Leukocytes,Ua: NEGATIVE
Nitrite: NEGATIVE
Protein, ur: NEGATIVE mg/dL
Specific Gravity, Urine: 1.026 (ref 1.005–1.030)
pH: 6 (ref 5.0–8.0)

## 2021-05-10 LAB — CBC
HCT: 40.3 % (ref 39.0–52.0)
Hemoglobin: 13.8 g/dL (ref 13.0–17.0)
MCH: 33.3 pg (ref 26.0–34.0)
MCHC: 34.2 g/dL (ref 30.0–36.0)
MCV: 97.3 fL (ref 80.0–100.0)
Platelets: 165 10*3/uL (ref 150–400)
RBC: 4.14 MIL/uL — ABNORMAL LOW (ref 4.22–5.81)
RDW: 12.2 % (ref 11.5–15.5)
WBC: 5.8 10*3/uL (ref 4.0–10.5)
nRBC: 0 % (ref 0.0–0.2)

## 2021-05-10 MED ORDER — OXYCODONE-ACETAMINOPHEN 5-325 MG PO TABS
1.0000 | ORAL_TABLET | Freq: Once | ORAL | Status: AC
  Administered 2021-05-10: 12:00:00 1 via ORAL
  Filled 2021-05-10: qty 1

## 2021-05-10 MED ORDER — DICLOFENAC SODIUM 1 % EX GEL: 4.0000 g | Freq: Three times a day (TID) | CUTANEOUS | 0 refills | Status: DC

## 2021-05-10 MED ORDER — OXYCODONE-ACETAMINOPHEN 5-325 MG PO TABS: 1.0000 | ORAL_TABLET | Freq: Four times a day (QID) | ORAL | 0 refills | Status: DC | PRN

## 2021-05-10 NOTE — ED Provider Notes (Signed)
UA negative for any signs of UTI.  Ultrasound negative.  Patient here with recurrent inguinal pain, has had recent negative CT as well as now ultrasound.  Per Dr. Larinda Buttery, suspect possible musculoskeletal etiology.  Will treat symptomatically and discharged with outpatient follow-up.  Return precautions given.   Shaune Pollack, MD 05/10/21 [phone]

## 2021-05-10 NOTE — ED Triage Notes (Addendum)
Pt arrived via [facility] with reports of continued groin pain, denies any swelling, reports urinary frequency. Pt states yesterday morning the pain was worse. Pt denies any pain with urination.  Pt is unable to describe the pain he is having in his groin. Pt states he sits most of the time and uses a walker. Pt lives alone. Pt states he is able to take care of himself.    Pt was seen here on 12/23 for the same complaint.  Pt actually points to suprapubic area where the pain is.

## 2021-05-10 NOTE — ED Provider Notes (Signed)
[facility] Emergency Department Provider Note   ____________________________________________   Event Date/Time   First MD Initiated Contact with Patient 05/10/21 1050     (approximate)  I have reviewed the triage vital signs and the nursing notes.   HISTORY  Chief Complaint Pelvic Pain    HPI Scott Cortez is a 87 y.o. male with past medical history of hypertension, hyperlipidemia, CAD, CHF, chronic pancreatitis, GERD, BPH who presents to the ED complaining of pelvic pain.  Patient reports that he has been dealing with about 2 weeks of pain extending along the right side of his groin, near his inguinal crease.  He describes the pain as sharp and exacerbated when he goes to spread his legs.  He has not noticed any swelling in the area and denies any pain in his testicles.  He denies any dysuria, fevers, or flank pain.  He is not sexually active and denies any penile discharge.  He reports being seen for the symptoms 1 week ago in the ED with unremarkable work-up at that time.  Symptoms have been about the same since that evaluation.        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   Incomplete emptying of bladder 01/26/2014   Increased frequency of urination 01/26/2014   Slowing of urinary stream 01/26/2014    Past Surgical History:  Procedure Laterality Date   CATARACT EXTRACTION W/PHACO  Left 02/02/2019   Procedure: CATARACT EXTRACTION PHACO AND INTRAOCULAR LENS PLACEMENT (IOC) LEFT ISTENT INJ AND ISTENT 01:09.1        11.3%          8.47;  Surgeon: Nevada Crane, MD;  Location: [facility];  Service: Ophthalmology;  Laterality: Left;   CATARACT EXTRACTION W/PHACO Right 02/23/2019   Procedure: CATARACT EXTRACTION PHACO AND INTRAOCULAR LENS PLACEMENT (IOC) RIGHT ISTENT INJ 01:09.1  14.1%  9.78;  Surgeon: Nevada Crane, MD;  Location: [facility];  Service: Ophthalmology;  Laterality: Right;   COLONOSCOPY N/A 10/15/2014   Procedure: COLONOSCOPY;  Surgeon: Scot Jun, MD;  Location: [facility] ENDOSCOPY;  Service: Endoscopy;  Laterality: N/A;   CORONARY ANGIOPLASTY     3 stents   coronary stenting     2007 (2), 2008 (1)   ESOPHAGOGASTRODUODENOSCOPY (EGD) WITH PROPOFOL N/A 10/15/2014   Procedure: ESOPHAGOGASTRODUODENOSCOPY (EGD) WITH PROPOFOL;  Surgeon: Scot Jun, MD;  Location: [facility] ENDOSCOPY;  Service: Endoscopy;  Laterality: N/A;   INSERTION OF ANTERIOR SEGMENT AQUEOUS DRAINAGE DEVICE (ISTENT) Left 02/02/2019   Procedure: INSERTION OF ANTERIOR SEGMENT AQUEOUS DRAINAGE DEVICE (ISTENT);  Surgeon: Nevada Crane, MD;  Location: [facility]ERY [facility]TR;  Service: Ophthalmology;  Laterality: Left;   SAVORY DILATION N/A 10/15/2014   Procedure: SAVORY DILATION;  Surgeon: Scot Jun, MD;  Location: [facility] ENDOSCOPY;  Service: Endoscopy;  Laterality: N/A;  Prior to Admission medications   Medication Sig Start Date End Date Taking? Authorizing Provider  apixaban (ELIQUIS) 5 MG TABS tablet Take 1 tablet (5 mg total) by mouth 2 (two) times daily. 03/12/19   Max Sane, MD  atorvastatin (LIPITOR) 20 MG tablet Take 20 mg by mouth daily.    [provider]  carbidopa-levodopa (SINEMET CR) 50-200 MG tablet Take 1 tablet by mouth at bedtime.    [provider]  carbidopa-levodopa (SINEMET IR) 25-100 MG tablet Take 2 tablets by mouth 4 (four)  times daily.    [provider]  carvedilol (COREG) 25 MG tablet Take 25 mg by mouth 2 (two) times daily.    [provider]  diltiazem (CARDIZEM CD) 120 MG 24 hr capsule Take 1 capsule (120 mg total) by mouth daily. 03/12/19   Max Sane, MD  furosemide (LASIX) 20 MG tablet Take 20 mg by mouth daily.    [provider]  losartan (COZAAR) 25 MG tablet Take 25 mg by mouth daily.    [provider]  rOPINIRole (REQUIP) 0.25 MG tablet Take 0.25-0.5 mg by mouth at bedtime.    [provider]  tamsulosin (FLOMAX) 0.4 MG CAPS capsule Take 1 capsule (0.4 mg total) by mouth daily. 09/12/20   Abbie Sons, MD    Allergies Pneumococcal vaccines  No family history on file.  Social History Social History   Tobacco Use   Smoking status: Former    Types: Cigarettes    Quit date: 1975    Years since quitting: 48.0   Smokeless tobacco: Never  Vaping Use   Vaping Use: Never used  Substance Use Topics   Alcohol use: No   Drug use: Not Currently    Types: Marijuana    Comment: none since 2019    Review of Systems  Constitutional: No fever/chills Eyes: No visual changes. ENT: No sore throat. Cardiovascular: Denies chest pain. Respiratory: Denies shortness of breath. Gastrointestinal: No abdominal pain.  No nausea, no vomiting.  No diarrhea.  No constipation. Genitourinary: Negative for dysuria.  Positive for pelvic pain. Musculoskeletal: Negative for back pain. Skin: Negative for rash. Neurological: Negative for headaches, focal weakness or numbness.  ____________________________________________   PHYSICAL EXAM:  VITAL SIGNS: ED Triage Vitals  Enc Vitals Group     BP 05/10/21 0514 121/88     Pulse Rate 05/10/21 0514 60     Resp 05/10/21 0514 20     Temp 05/10/21 0514 97.6 F (36.4 C)     Temp Source 05/10/21 0514 Oral     SpO2 05/10/21 0514 97 %     Weight 05/10/21 0525 150 lb (68 kg)     Height 05/10/21 0525 5\' 8"  (1.727 m)      Head Circumference --      Peak Flow --      Pain Score 05/10/21 0524 10     Pain Loc --      Pain Edu? --      Excl. in [place]? --     Constitutional: Alert and oriented. Eyes: Conjunctivae are normal. Head: Atraumatic. Nose: No congestion/rhinnorhea. Mouth/Throat: Mucous membranes are moist. Neck: Normal ROM Cardiovascular: Normal rate, regular rhythm. Grossly normal heart sounds.  2+ radial pulses bilaterally. Respiratory: Normal respiratory effort.  No retractions. Lungs CTAB. Gastrointestinal: Soft and nontender. No distention. Genitourinary: Tenderness to palpation noted along right spermatic cord into right testicle, no edema noted.  No penile edema, erythema, or tenderness noted. Musculoskeletal: No lower extremity  tenderness nor edema. Neurologic:  Normal speech and language. No gross focal neurologic deficits are appreciated. Skin:  Skin is warm, dry and intact. No rash noted. Psychiatric: Mood and affect are normal. Speech and behavior are normal.  ____________________________________________   LABS (all labs ordered are listed, but only abnormal results are displayed)  Labs Reviewed  BASIC METABOLIC PANEL - Abnormal; Notable for the following components:      Result Value   Glucose, Bld 109 (*)    BUN 24 (*)    All other components within normal limits  CBC - Abnormal; Notable for the following components:   RBC 4.14 (*)    All other components within normal limits  URINALYSIS, ROUTINE W REFLEX MICROSCOPIC    PROCEDURES  Procedure(s) performed (including Critical Care):  Procedures   ____________________________________________   INITIAL IMPRESSION / ASSESSMENT AND PLAN / ED COURSE      87 year old male with past medical history of hypertension, hyperlipidemia, CAD, CHF, GERD, chronic pancreatitis, BPH who presents to the ED with 2 weeks of constant pain along his right inguinal crease, worse with certain movements of his right leg.  He is tender to  palpation along the right inguinal crease and particularly near the spermatic cord with testicular tenderness.  We will further assess with ultrasound, he had CT scan performed 1 week ago that was unremarkable and given lack of abdominal tenderness I do not feel this needs to be repeated.  Labs are unremarkable and urine sample is pending, we will treat symptomatically with Percocet.  Ultrasound is unremarkable, UA is pending at this time.  Patient turned over to oncoming divider pending urine results, but if this is unremarkable patient would be appropriate for outpatient management.  He has an appointment scheduled with urology on January 4 and was counseled to follow-up as scheduled.      ____________________________________________   FINAL CLINICAL IMPRESSION(S) / ED DIAGNOSES  Final diagnoses:  Pelvic pain     ED Discharge Orders     None        Note:  This document was prepared using Dragon voice recognition software and may include unintentional dictation errors.    Blake Divine, MD 05/10/21 [phone]

## 2021-05-10 NOTE — Discharge Instructions (Signed)
Apply the voltaren gel to the area of pain three times daily for the next week  Take the prescribed medicine for severe pain  Once pain improves, you can switch to Tylenol 1000 mg every 6 hours for pain

## 2021-05-17 ENCOUNTER — Observation Stay
Admission: EM | Admit: 2021-05-17 | Discharge: 2021-05-18 | Disposition: A | Discharge status: Home / Self Care | Payer: Medicare Other | Attending: Internal Medicine | Admitting: Internal Medicine

## 2021-05-17 ENCOUNTER — Other Ambulatory Visit: Payer: Self-pay

## 2021-05-17 ENCOUNTER — Emergency Department: Payer: Medicare Other

## 2021-05-17 ENCOUNTER — Ambulatory Visit: Payer: Medicare Other | Admitting: Urology

## 2021-05-17 DIAGNOSIS — G20 Parkinson's disease: Secondary | ICD-10-CM | POA: Diagnosis not present

## 2021-05-17 DIAGNOSIS — Z20822 Contact with and (suspected) exposure to covid-19: Secondary | ICD-10-CM | POA: Insufficient documentation

## 2021-05-17 DIAGNOSIS — R1031 Right lower quadrant pain: Principal | ICD-10-CM | POA: Diagnosis present

## 2021-05-17 DIAGNOSIS — I2510 Atherosclerotic heart disease of native coronary artery without angina pectoris: Secondary | ICD-10-CM | POA: Diagnosis not present

## 2021-05-17 DIAGNOSIS — E785 Hyperlipidemia, unspecified: Secondary | ICD-10-CM | POA: Diagnosis not present

## 2021-05-17 DIAGNOSIS — I480 Paroxysmal atrial fibrillation: Secondary | ICD-10-CM | POA: Insufficient documentation

## 2021-05-17 DIAGNOSIS — I10 Essential (primary) hypertension: Secondary | ICD-10-CM | POA: Diagnosis not present

## 2021-05-17 DIAGNOSIS — I712 Thoracic aortic aneurysm, without rupture, unspecified: Secondary | ICD-10-CM

## 2021-05-17 DIAGNOSIS — R5381 Other malaise: Secondary | ICD-10-CM

## 2021-05-17 HISTORY — DX: Thoracic aortic aneurysm, without rupture, unspecified: I71.20

## 2021-05-17 LAB — URINALYSIS, ROUTINE W REFLEX MICROSCOPIC
Bilirubin Urine: NEGATIVE
Glucose, UA: NEGATIVE mg/dL
Hgb urine dipstick: NEGATIVE
Ketones, ur: 5 mg/dL — AB
Leukocytes,Ua: NEGATIVE
Nitrite: NEGATIVE
Protein, ur: NEGATIVE mg/dL
Specific Gravity, Urine: 1.023 (ref 1.005–1.030)
pH: 6 (ref 5.0–8.0)

## 2021-05-17 LAB — CBC WITH DIFFERENTIAL/PLATELET
Abs Immature Granulocytes: 0.01 10*3/uL (ref 0.00–0.07)
Basophils Absolute: 0 10*3/uL (ref 0.0–0.1)
Basophils Relative: 1 %
Eosinophils Absolute: 0.1 10*3/uL (ref 0.0–0.5)
Eosinophils Relative: 2 %
HCT: 39.9 % (ref 39.0–52.0)
Hemoglobin: 14 g/dL (ref 13.0–17.0)
Immature Granulocytes: 0 %
Lymphocytes Relative: 18 %
Lymphs Abs: 0.8 10*3/uL (ref 0.7–4.0)
MCH: 33.5 pg (ref 26.0–34.0)
MCHC: 35.1 g/dL (ref 30.0–36.0)
MCV: 95.5 fL (ref 80.0–100.0)
Monocytes Absolute: 0.5 10*3/uL (ref 0.1–1.0)
Monocytes Relative: 11 %
Neutro Abs: 3.1 10*3/uL (ref 1.7–7.7)
Neutrophils Relative %: 68 %
Platelets: 163 10*3/uL (ref 150–400)
RBC: 4.18 MIL/uL — ABNORMAL LOW (ref 4.22–5.81)
RDW: 12 % (ref 11.5–15.5)
WBC: 4.5 10*3/uL (ref 4.0–10.5)
nRBC: 0 % (ref 0.0–0.2)

## 2021-05-17 LAB — RESP PANEL BY RT-PCR (FLU A&B, COVID) ARPGX2
Influenza A by PCR: NEGATIVE
Influenza B by PCR: NEGATIVE
SARS Coronavirus 2 by RT PCR: NEGATIVE

## 2021-05-17 LAB — BASIC METABOLIC PANEL
Anion gap: 6 (ref 5–15)
BUN: 23 mg/dL (ref 8–23)
CO2: 28 mmol/L (ref 22–32)
Calcium: 9.2 mg/dL (ref 8.9–10.3)
Chloride: 101 mmol/L (ref 98–111)
Creatinine, Ser: 0.83 mg/dL (ref 0.61–1.24)
GFR, Estimated: >60 mL/min (ref >60)
Glucose, Bld: 98 mg/dL (ref 70–99)
Potassium: 4.2 mmol/L (ref 3.5–5.1)
Sodium: 135 mmol/L (ref 135–145)

## 2021-05-17 MED ORDER — APIXABAN 5 MG PO TABS
5.0000 mg | ORAL_TABLET | Freq: Two times a day (BID) | ORAL | Status: DC
  Administered 2021-05-17 – 2021-05-18 (×2): 5 mg via ORAL
  Filled 2021-05-17 (×2): qty 1

## 2021-05-17 MED ORDER — IOHEXOL 350 MG/ML SOLN
100.0000 mL | Freq: Once | INTRAVENOUS | Status: AC | PRN
  Administered 2021-05-17: 100 mL via INTRAVENOUS
  Filled 2021-05-17: qty 100

## 2021-05-17 MED ORDER — MORPHINE SULFATE (PF) 4 MG/ML IV SOLN
4.0000 mg | Freq: Once | INTRAVENOUS | Status: AC
  Administered 2021-05-17: 4 mg via INTRAVENOUS
  Filled 2021-05-17: qty 1

## 2021-05-17 MED ORDER — SODIUM CHLORIDE 0.9% FLUSH
3.0000 mL | Freq: Two times a day (BID) | INTRAVENOUS | Status: DC
  Administered 2021-05-17 – 2021-05-18 (×2): 3 mL via INTRAVENOUS

## 2021-05-17 MED ORDER — LOSARTAN POTASSIUM 50 MG PO TABS
25.0000 mg | ORAL_TABLET | Freq: Every day | ORAL | Status: DC
  Administered 2021-05-17 – 2021-05-18 (×2): 25 mg via ORAL
  Filled 2021-05-17 (×2): qty 1

## 2021-05-17 MED ORDER — ACETAMINOPHEN 500 MG PO TABS
1000.0000 mg | ORAL_TABLET | Freq: Once | ORAL | Status: AC
  Administered 2021-05-17: 1000 mg via ORAL
  Filled 2021-05-17: qty 2

## 2021-05-17 MED ORDER — CARBIDOPA-LEVODOPA ER 50-200 MG PO TBCR
1.0000 | EXTENDED_RELEASE_TABLET | Freq: Every day | ORAL | Status: DC
  Administered 2021-05-17: 1 via ORAL
  Filled 2021-05-17 (×2): qty 1

## 2021-05-17 MED ORDER — ROPINIROLE HCL 0.25 MG PO TABS
0.2500 mg | ORAL_TABLET | Freq: Every day | ORAL | Status: DC
  Administered 2021-05-17: 0.5 mg via ORAL
  Filled 2021-05-17 (×2): qty 2

## 2021-05-17 MED ORDER — LIDOCAINE 5 % EX PTCH: 1.0000 | MEDICATED_PATCH | Freq: Two times a day (BID) | CUTANEOUS | 1 refills | Status: DC

## 2021-05-17 MED ORDER — CARVEDILOL 6.25 MG PO TABS
25.0000 mg | ORAL_TABLET | Freq: Two times a day (BID) | ORAL | Status: DC
  Administered 2021-05-17 – 2021-05-18 (×2): 25 mg via ORAL
  Filled 2021-05-17 (×2): qty 4

## 2021-05-17 MED ORDER — LIDOCAINE 5 % EX PTCH
1.0000 | MEDICATED_PATCH | CUTANEOUS | Status: DC
  Administered 2021-05-17: 1 via TRANSDERMAL
  Filled 2021-05-17: qty 1

## 2021-05-17 MED ORDER — FUROSEMIDE 40 MG PO TABS
20.0000 mg | ORAL_TABLET | Freq: Every day | ORAL | Status: DC
  Administered 2021-05-17 – 2021-05-18 (×2): 20 mg via ORAL
  Filled 2021-05-17 (×2): qty 1

## 2021-05-17 MED ORDER — POLYETHYLENE GLYCOL 3350 17 G PO PACK
17.0000 g | PACK | Freq: Every day | ORAL | Status: DC
  Administered 2021-05-17: 17 g via ORAL
  Filled 2021-05-17 (×2): qty 1

## 2021-05-17 MED ORDER — TAMSULOSIN HCL 0.4 MG PO CAPS
0.4000 mg | ORAL_CAPSULE | Freq: Every day | ORAL | Status: DC
  Administered 2021-05-18: 0.4 mg via ORAL
  Filled 2021-05-17: qty 1

## 2021-05-17 MED ORDER — CARBIDOPA-LEVODOPA 25-100 MG PO TABS
2.0000 | ORAL_TABLET | Freq: Four times a day (QID) | ORAL | Status: DC
  Administered 2021-05-17: 2 via ORAL
  Filled 2021-05-17 (×5): qty 2

## 2021-05-17 MED ORDER — ATORVASTATIN CALCIUM 20 MG PO TABS
20.0000 mg | ORAL_TABLET | Freq: Every day | ORAL | Status: DC
  Administered 2021-05-17 – 2021-05-18 (×2): 20 mg via ORAL
  Filled 2021-05-17 (×2): qty 1

## 2021-05-17 MED ORDER — ALBUTEROL SULFATE (2.5 MG/3ML) 0.083% IN NEBU: 2.5000 mg | INHALATION_SOLUTION | RESPIRATORY_TRACT | Status: DC | PRN

## 2021-05-17 NOTE — ED Triage Notes (Signed)
Pt comes with c//o right femoral area pain with movement. Pt states nontender on palpation. Pt states pain is worse with movement. Pt has been seen here for same in past.   Pt did have 4 stents placed through that same area and could be causing pain. VSS
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
## 2021-05-17 NOTE — H&P (Addendum)
History and Physical    Scott Cortez [id number] DOB: [birth year] DOA: 05/17/2021  PCP: [facility]  Patient coming from: home  I have personally briefly reviewed patient's old medical records in [place]  Chief Complaint: right groin pain   HPI: Scott Cortez is a 88 y.o. male with medical history significant of  GERD coronary disease PCI stent x2 ,hypertension, hyperlipidemia bradycardia,  A. fib , Parkinson's disease, debility uses walker  with hx of falls multiple who has history of right lower abdominal pain, testicular pain as well as grown pain since December. Patient now presents  with 3-4 days of intense groin pain similar to prior episodes of right sided discomfort. He notes pain starts in right groin and travels over anterior thigh to knee that is sharp and made worse by certain movement. He states that he feels he over did his exercises.Patient had work up in ed with ct abd/pelvis w/o any acute findings. Patient denies fever chills, dysuria, diarrhea, injury to leg. He currently notes after iv med in ed his pain has resolved. He is in favor or admission and rehab placement if needed.   ED Course:  Afeb, bp 130/94, hr78, rr 18  sat 96  Wbc:4.5,hgb 14, plt 163  IMPRESSION: VASCULAR   1. Stable appearance of the abdominal aorta with severe atherosclerotic disease and chronic focal dissection. Abdominal aortic aneurysm is 3.2 cm and stable in size. Recommend follow-up ultrasound every 3 years. This recommendation follows ACR consensus guidelines: White Paper of the ACR Incidental Findings Committee II on Vascular Findings. J Am Coll Radiol 2013; 10:789-794. 2.  Aortic Atherosclerosis (ICD10-I70.0). 3. Stable focal bulge in the right common iliac artery which could be related to a penetrating ulcer. This has minimally changed since 2017. 4. 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    NON-VASCULAR   1. No acute abnormality in the abdomen or pelvis.  Plans made to discharge patient but patient was unable to walk due to pain and felt to be an unsafe discharge as patient lives alone. Patient is admitted for pain control PT and rehab placement evaluation  Review of Systems: As per HPI otherwise 10 point review of systems negative.   Past Medical History:  Diagnosis Date   Bladder neck obstruction    Bradycardia    CHF (congestive heart failure) (HCC)    Chronic pancreatitis (HCC)    Coronary artery disease    Dental bridge present    upper - bilateral   GERD (gastroesophageal reflux disease)    Glaucoma    Hyperlipidemia    Hypertension    Myocardial infarction ([place])    Neuropathy    feet    Past Surgical History:  Procedure Laterality Date   CATARACT EXTRACTION W/PHACO Left 02/02/2019   Procedure: CATARACT EXTRACTION PHACO AND INTRAOCULAR LENS PLACEMENT (IOC) LEFT ISTENT INJ AND ISTENT 01:09.1        11.3%          8.47;  Surgeon: [name], MD;  Location: [place];  Service: Ophthalmology;  Laterality: Left;   CATARACT EXTRACTION W/PHACO Right 02/23/2019   Procedure: CATARACT EXTRACTION PHACO AND INTRAOCULAR LENS PLACEMENT (IOC) RIGHT ISTENT INJ 01:09.1  14.1%  9.78;  Surgeon: [name], MD;  Location: [place];  Service: Ophthalmology;  Laterality: Right;   COLONOSCOPY N/A 10/15/2014   Procedure: COLONOSCOPY;  Surgeon: [name], MD;  Location:  [facility]COPY;  Service: Endoscopy;  Laterality: N/A;   CORONARY ANGIOPLASTY     3 stents   coronary stenting     2007 (2), 2008 (1)   ESOPHAGOGASTRODUODENOSCOPY (EGD) WITH PROPOFOL N/A 10/15/2014   Procedure: ESOPHAGOGASTRODUODENOSCOPY (EGD) WITH PROPOFOL;  Surgeon: [name], MD;  Location: [facility] ENDOSCOPY;  Service: Endoscopy;  Laterality: N/A;   INSERTION OF ANTERIOR SEGMENT AQUEOUS DRAINAGE DEVICE (ISTENT) Left 02/02/2019   Procedure: INSERTION OF ANTERIOR  SEGMENT AQUEOUS DRAINAGE DEVICE (ISTENT);  Surgeon: [name], MD;  Location: [place];  Service: Ophthalmology;  Laterality: Left;   SAVORY DILATION N/A 10/15/2014   Procedure: SAVORY DILATION;  Surgeon: [name], MD;  Location: [facility] ENDOSCOPY;  Service: Endoscopy;  Laterality: N/A;     reports that he quit smoking about 48 years ago. He has never used smokeless tobacco. He reports that he does not currently use drugs after having used the following drugs: Marijuana. He reports that he does not drink alcohol.  Allergies  Allergen Reactions   Pneumococcal Vaccines Swelling    No family history on file. Parkinsons's disease Prior to Admission medications   Medication Sig Start Date End Date Taking? Authorizing Provider  lidocaine (LIDODERM) 5 % Place 1 patch onto the skin every 12 (twelve) hours. Remove & Discard patch within 12 hours or as directed by MD 05/17/21 05/17/22 Yes Vladimir Crofts, MD  apixaban (ELIQUIS) 5 MG TABS tablet Take 1 tablet (5 mg total) by mouth 2 (two) times daily. 03/12/19   Max Sane, MD  atorvastatin (LIPITOR) 20 MG tablet Take 20 mg by mouth daily.    [provider]  carbidopa-levodopa (SINEMET CR) 50-200 MG tablet Take 1 tablet by mouth at bedtime.    [provider]  carbidopa-levodopa (SINEMET IR) 25-100 MG tablet Take 2 tablets by mouth 4 (four) times daily.    [provider]  carvedilol (COREG) 25 MG tablet Take 25 mg by mouth 2 (two) times daily.    [provider]  diclofenac Sodium (VOLTAREN) 1 % GEL Apply 4 g topically in the morning, at noon, and at bedtime for 7 days. Apply to area of pain in groin area 05/10/21 05/17/21  Duffy Bruce, MD  diltiazem (CARDIZEM CD) 120 MG 24 hr capsule Take 1 capsule (120 mg total) by mouth daily. Patient not taking: Reported on 05/10/2021 03/12/19   Max Sane, MD  furosemide (LASIX) 20 MG tablet Take 20 mg by mouth daily.    [provider]  losartan  (COZAAR) 25 MG tablet Take 25 mg by mouth daily.    [provider]  oxyCODONE-acetaminophen (PERCOCET) 5-325 MG tablet Take 1 tablet by mouth every 6 (six) hours as needed for severe pain. 05/10/21 05/10/22  Duffy Bruce, MD  polyethylene glycol (MIRALAX / GLYCOLAX) 17 g packet Take 17 g by mouth daily.    [provider]  rOPINIRole (REQUIP) 0.25 MG tablet Take 0.25-0.5 mg by mouth at bedtime.    [provider]  tamsulosin (FLOMAX) 0.4 MG CAPS capsule Take 1 capsule (0.4 mg total) by mouth daily. 09/12/20   Abbie Sons, MD    Physical Exam: Vitals:   05/17/21 0958 05/17/21 1600  BP: (!) 130/94 (!) 150/107  Pulse: 78 84  Resp: 18 18  Temp: 97.7 F (36.5 C)   SpO2: 96% 98%    Constitutional: NAD, calm, comfortable Vitals:   05/17/21 0958 05/17/21 1600  BP: (!) 130/94 (!) 150/107  Pulse: 78 84  Resp: 18 18  Temp: 97.7 F (36.5 C)   SpO2: 96% 98%   Eyes: PERRL, lids and conjunctivae normal ENMT: Mucous membranes are moist. Posterior pharynx clear of any exudate or lesions.Normal dentition.  Neck: normal, supple, no masses, no thyromegaly Respiratory: clear to auscultation bilaterally, no wheezing, no crackles. Normal respiratory effort. No accessory muscle use.  Cardiovascular: Regular rate and rhythm, no murmurs / rubs / gallops. No extremity edema. 2+ pedal pulses. No carotid bruits.  Abdomen: no tenderness, no masses palpated. No hepatosplenomegaly. Bowel sounds positive.  Musculoskeletal: no clubbing / cyanosis. No joint deformity upper and lower extremities. Good ROM, no contractures. Normal muscle tone. No pain on rom of hip joint, no pain on palpation of groin, no masses noted. Skin: no rashes, lesions, ulcers. No induration Neurologic: CN 2-12 grossly intact. Sensation intact,  Strength 5/5 in all 4.  Psychiatric: Normal judgment and insight. Alert and oriented x 3. Normal mood.    Labs on Admission: I have personally reviewed following  labs and imaging studies  CBC: Recent Labs  Lab 05/17/21 1403  WBC 4.5  NEUTROABS 3.1  HGB 14.0  HCT 39.9  MCV 95.5  PLT [id number]   Basic Metabolic Panel: Recent Labs  Lab 05/17/21 1403  NA 135  K 4.2  CL 101  CO2 28  GLUCOSE 98  BUN 23  CREATININE 0.83  CALCIUM 9.2   GFR: Estimated Creatinine Clearance: 62.6 mL/min (by C-G formula based on SCr of 0.83 mg/dL). Liver Function Tests: No results for input(s): AST, ALT, ALKPHOS, BILITOT, PROT, ALBUMIN in the last 168 hours. No results for input(s): LIPASE, AMYLASE in the last 168 hours. No results for input(s): AMMONIA in the last 168 hours. Coagulation Profile: No results for input(s): INR, PROTIME in the last 168 hours. Cardiac Enzymes: No results for input(s): CKTOTAL, CKMB, CKMBINDEX, TROPONINI in the last 168 hours. BNP (last 3 results) No results for input(s): PROBNP in the last 8760 hours. HbA1C: No results for input(s): HGBA1C in the last 72 hours. CBG: No results for input(s): GLUCAP in the last 168 hours. Lipid Profile: No results for input(s): CHOL, HDL, LDLCALC, TRIG, CHOLHDL, LDLDIRECT in the last 72 hours. Thyroid Function Tests: No results for input(s): TSH, T4TOTAL, FREET4, T3FREE, THYROIDAB in the last 72 hours. Anemia Panel: No results for input(s): VITAMINB12, FOLATE, FERRITIN, TIBC, IRON, RETICCTPCT in the last 72 hours. Urine analysis:    Component Value Date/Time   COLORURINE YELLOW (A) 05/17/2021 1403   APPEARANCEUR HAZY (A) 05/17/2021 1403   APPEARANCEUR Clear 10/24/2011 2127   LABSPEC 1.023 05/17/2021 1403   LABSPEC 1.003 10/24/2011 2127   PHURINE 6.0 05/17/2021 1403   GLUCOSEU NEGATIVE 05/17/2021 1403   GLUCOSEU Negative 10/24/2011 2127   HGBUR NEGATIVE 05/17/2021 1403   BILIRUBINUR NEGATIVE 05/17/2021 1403   BILIRUBINUR Negative 10/24/2011 2127   KETONESUR 5 (A) 05/17/2021 1403   PROTEINUR NEGATIVE 05/17/2021 1403   NITRITE NEGATIVE 05/17/2021 1403   LEUKOCYTESUR NEGATIVE 05/17/2021  1403   LEUKOCYTESUR Negative 10/24/2011 2127    Radiological Exams on Admission: CT Angio Abd/Pel W and/or Wo Contrast  Result Date: 05/17/2021 CLINICAL DATA:  Iliac artery dissection. Complains of right femoral area pain with movement. EXAM: CT ANGIOGRAPHY ABDOMEN AND PELVIS WITH CONTRAST AND WITHOUT CONTRAST TECHNIQUE: Multidetector CT imaging of the abdomen and pelvis was performed using the standard protocol during bolus administration of intravenous contrast. Multiplanar reconstructed images and MIPs were obtained and reviewed to evaluate the vascular anatomy. CONTRAST:  147mL OMNIPAQUE  IOHEXOL 350 MG/ML SOLN COMPARISON:  05/05/2021 and CTA from 12/31/2015 FINDINGS: VASCULAR Aorta: Distal thoracic aorta measures 3.0 cm. Severe atherosclerotic disease in the infrarenal abdominal aorta with evidence of a chronic dissection flap. Irregular mural thrombus throughout the infrarenal abdominal aorta. The distal abdominal aorta is aneurysmal measuring up to 3.2 cm and stable. Celiac: Patent without evidence of aneurysm, dissection, vasculitis or significant stenosis. SMA: Patent without evidence of aneurysm, dissection, vasculitis or significant stenosis. Renals: Both renal arteries are patent without evidence of aneurysm, dissection, vasculitis, fibromuscular dysplasia or significant stenosis. IMA: Patent Inflow: Again noted is a focal bulge along the medial aspect of the right common iliac artery suggestive for a small penetrating ulcer. The bulge measures roughly 0.7 cm and combined dimension of the common iliac artery with the focal bulge measures up to 1.8 cm and these findings have minimally changed since 2017. Mid right common iliac artery measures 2.0 cm. Right internal and right external iliac arteries are patent without significant stenosis. Left common iliac artery is tortuous but patent. Left common iliac artery measures up to 1.7 cm. Left internal and left external iliac arteries are patent. Aneurysm  in the left internal iliac artery measures up to 1.7 cm and measured roughly 1.5 cm in 2017. Proximal Outflow: Left common femoral artery is ectatic measuring up to 1.5 cm. Proximal femoral arteries are patent bilaterally. Superior femoral arteries are patent bilaterally. Veins: Iliac veins and IVC are patent. Renal veins are patent. Main portal venous system is patent. Review of the MIP images confirms the above findings. NON-VASCULAR Lower chest: Chronic cystic changes along the medial left lower lobe. No pleural effusions. Hepatobiliary: Normal appearance of the liver, gallbladder and portal venous system. Pancreas: Normal appearance of the pancreas without inflammation or duct dilatation. Spleen: Normal appearance of spleen without enlargement. Adrenals/Urinary Tract: Normal appearance the adrenal glands. Normal appearance of both kidneys without hydronephrosis. Normal appearance of the urinary bladder. Stomach/Bowel: Colonic diverticulosis without acute inflammation. Normal appearance of the stomach. Lymphatic: No lymph node enlargement in the abdomen or pelvis. Reproductive: Prostate is unremarkable. Other: No free fluid.  Negative for free air. Musculoskeletal: Chronic disc space narrowing at L5-S1. Mild chronic irregularity along the superior endplate of L3 which is minimally changed. Again noted is a compression deformity or large Schmorl's node along the inferior endplate of 624THL. IMPRESSION: VASCULAR 1. Stable appearance of the abdominal aorta with severe atherosclerotic disease and chronic focal dissection. Abdominal aortic aneurysm is 3.2 cm and stable in size. Recommend follow-up ultrasound every 3 years. This recommendation follows ACR consensus guidelines: White Paper of the ACR Incidental Findings Committee II on Vascular Findings. J Am Coll Radiol 2013; 10:789-794. 2.  Aortic Atherosclerosis (ICD10-I70.0). 3. Stable focal bulge in the right common iliac artery which could be related to a penetrating  ulcer. This has minimally changed since 2017. 4. 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 NON-VASCULAR 1. No acute abnormality in the abdomen or pelvis. Electronically Signed   By: Markus Daft M.D.   On: 05/17/2021 16:10    EKG: Independently reviewed. N/a  Assessment/Plan Right groin pain  -possible myofascial pain syndrome/lateral cutaneous femoral nerve syndrome  -PT to see , supportive care with pain medicine  -consider physiatry evaluation for nerve block  -no bony abnormalities of hip noted on ct pelvis.  Chronic debility due to age and hx of Parkinsons  unsafe d/c  due concern for falls at home and  current poor pain control -supportive care for pain will try neuropathic medication  -PT/OT/SW to see   PAF -rate controlled  -continue Eliquis for anticoagulation ,Coreg for rate controll  CAD Hx of pci with stent RCA -no active issues  -resume GDMT (arb ,bb)  HTN Stable continue on current arb and bb  HLD Continue on statin   Hx of CMY ef 30-35 -compensated currently  -continue on lasix   BPH Continue flomax   Parkinson's -continue sinement    DVT prophylaxis: Eliquis Code Status: Full Family Communication: none at bedside Disposition Plan: patient  expected to be admitted less than 2 midnights  Consults called:n/a Admission status: obs   Clance Boll MD [facility]   If 7PM-7AM, please contact night-coverage [email or web address] Password [facility]  05/17/2021, 6:26 PM

## 2021-05-17 NOTE — Discharge Instructions (Addendum)
Use Tylenol for pain and fevers.  Up to 1000 mg per dose, up to 4 times per day.  Do not take more than 4000 mg of Tylenol/acetaminophen within 24 hours..  Please use lidocaine patches and your site of pain.  Apply 1 patch at a time, leave on for 12 hours, then remove for 12 hours.  12 hours on, 12 hours off.  Do not apply more than 1 patch at a time.  

## 2021-05-17 NOTE — ED Notes (Signed)
Attempted to discharge patient, but unable to stand and walk without 2 person assist. Per family pt lives alone and has stairs in the home that he needs to use to get into home. Dr. Katrinka Blazing made aware, pt will be admitted to rehab.

## 2021-05-17 NOTE — ED Notes (Signed)
ED Provider at bedside. 

## 2021-05-17 NOTE — ED Provider Notes (Addendum)
Patient received in signout from Dr. Larinda Buttery pending CT abdomen/pelvis w/wo contrast to evaluate for subacute right groin pain.  He has been seen in the ED multiple times for the same complaint and has had a benign work-up during each visit including scrotal ultrasounds ,blood work and urinalysis.  CT obtained and without evidence of acute pathology.  I evaluate the patient and he has right inguinal pain without overlying skin changes, bulging or masses.  Negative anticipate MSK strain in the setting of him overdoing it with home exercises.  We discussed nonnarcotic analgesia with Tylenol and lidocaine patches.  We discussed following up closely with urology and his PCP.  We discussed return precautions for the ED and patient is suitable for outpatient management.  Patient tries to get up with nursing, but is extremely unsteady despite two-person max assist and unable to effectively ambulate.  He lives by himself and has multiple steps in the house.  I am concerned about unsafe discharge.  Due to continued poorly controlled pain and inability to safely discharge, we will consult with hospitalist for possible medical observation admission to facilitate SNF placement.   Delton Prairie, MD 05/17/21 7829    Delton Prairie, MD 05/17/21 1726

## 2021-05-17 NOTE — ED Notes (Signed)
Pt's family out to nurse's station stating that the pt is hungry and hasn't eaten since yesterday, family informed that this RN would ask the dr if he could eat. Pt taken a [place] sandwich tray and pt states that he doesn't eat meat. Pt given a couple containers of applesauce, peanut butter, and graham crackers, pt assisted to sit up in bed to eat and given ice water, pt states that he doesn't drink ice water, that he likes sink water, pt given sink water upon request

## 2021-05-18 DIAGNOSIS — R1031 Right lower quadrant pain: Secondary | ICD-10-CM | POA: Diagnosis not present

## 2021-05-18 LAB — COMPREHENSIVE METABOLIC PANEL
ALT: 9 U/L (ref 0–44)
AST: 23 U/L (ref 15–41)
Albumin: 3.5 g/dL (ref 3.5–5.0)
Alkaline Phosphatase: 73 U/L (ref 38–126)
Anion gap: 8 (ref 5–15)
BUN: 22 mg/dL (ref 8–23)
CO2: 28 mmol/L (ref 22–32)
Calcium: 8.7 mg/dL — ABNORMAL LOW (ref 8.9–10.3)
Chloride: 99 mmol/L (ref 98–111)
Creatinine, Ser: 0.94 mg/dL (ref 0.61–1.24)
GFR, Estimated: >60 mL/min (ref >60)
Glucose, Bld: 153 mg/dL — ABNORMAL HIGH (ref 70–99)
Potassium: 3.7 mmol/L (ref 3.5–5.1)
Sodium: 135 mmol/L (ref 135–145)
Total Bilirubin: 2.1 mg/dL — ABNORMAL HIGH (ref 0.3–1.2)
Total Protein: 5.9 g/dL — ABNORMAL LOW (ref 6.5–8.1)

## 2021-05-18 LAB — CBC
HCT: 38.3 % — ABNORMAL LOW (ref 39.0–52.0)
Hemoglobin: 13.4 g/dL (ref 13.0–17.0)
MCH: 33.3 pg (ref 26.0–34.0)
MCHC: 35 g/dL (ref 30.0–36.0)
MCV: 95 fL (ref 80.0–100.0)
Platelets: 157 10*3/uL (ref 150–400)
RBC: 4.03 MIL/uL — ABNORMAL LOW (ref 4.22–5.81)
RDW: 12.1 % (ref 11.5–15.5)
WBC: 4.4 10*3/uL (ref 4.0–10.5)
nRBC: 0 % (ref 0.0–0.2)

## 2021-05-18 MED ORDER — DICLOFENAC SODIUM 1 % EX GEL: 4.0000 g | Freq: Three times a day (TID) | CUTANEOUS | 0 refills | Status: AC

## 2021-05-18 NOTE — Care Management Obs Status (Signed)
MEDICARE OBSERVATION STATUS NOTIFICATION   Patient Details  Name: Scott Cortez MRN: [id number] Date of Birth: 1933-12-20   Medicare Observation Status Notification Given:  Yes    Allayne Butcher, RN 05/18/2021, 12:23 PM

## 2021-05-18 NOTE — Evaluation (Signed)
Physical Therapy Evaluation Patient Details Name: CADARIUS NEVARES MRN: [id number] DOB: 1933-09-22 Today's Date: 05/18/2021  History of Present Illness  Pt is an 88 y.o. male presenting to hospital 05/17/20 with c/o R groin pain and inguinal crease extending down into R thigh.  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  PMH includes htn, HLD, CAD, CHF, urinary bladder obstruction, Parkinson's disease, h/o falls.  Clinical Impression  Prior to hospital admission, pt was ambulatory with RW; lives alone on main level of home with 4 STE L railing; has girlfriend that assists pt as needed.  Currently pt is SBA semi-supine to sitting edge of stretcher bed; CGA with transfers using RW; and CGA to ambulate 75 feet with RW (pt appears able to walker further).  Pt left in bathroom with OT present assisting pt (for OT evaluation) end of session.  Pt requiring significant extra time for all mobility and pt was very specific on how he did things during session (pt knows what works for him and pt stating multiple times during session that things take a lot longer for him at baseline to perform).  Mild R hip/thigh pain/discomfort noted during sessions activities.  No loss of balance noted during sessions activities.  Pt would benefit from skilled PT to address noted impairments and functional limitations (see below for any additional details).  Upon hospital discharge, pt would benefit from HHPT and intermittent supervision/assist as needed.    Recommendations for follow up therapy are one component of a multi-disciplinary discharge planning process, led by the attending physician.  Recommendations may be updated based on patient status, additional functional criteria and insurance authorization.  Follow Up Recommendations Home health PT     Assistance Recommended at Discharge Intermittent Supervision/Assistance  Patient can return home with the following  Assistance with cooking/housework;Assist for transportation;Help with stairs or ramp for entrance    Equipment Recommendations Rolling walker (2 wheels);BSC/3in1  Recommendations for Other Services  OT consult    Functional Status Assessment Patient has had a recent decline in their functional status and demonstrates the ability to make significant improvements in function in a reasonable and predictable amount of time.     Precautions / Restrictions Precautions Precautions: Fall Restrictions Weight Bearing Restrictions: No      Mobility  Bed Mobility Overal bed mobility: Needs Assistance Bed Mobility: Supine to Sit     Supine to sit: Supervision;HOB elevated     General bed mobility comments: mild increased effort/time for pt to perform on own    Transfers Overall transfer level: Needs assistance Equipment used: Rolling walker (2 wheels) Transfers: Sit to/from Stand Sit to Stand: Min guard           General transfer comment: x1 trial from stretcher bed (mild increased effort to stand but steady)    Ambulation/Gait Ambulation/Gait assistance: Min guard Gait Distance (Feet): 75 Feet Assistive device: Rolling walker (2 wheels) Gait Pattern/deviations: Narrow base of support Gait velocity: decreased     General Gait Details: shorter shuffling type steps; pt holding 1 hand (L) on walker and 1 hand (R) on wall railing for about 10 feet (pt reports doing this at home) but otherwise had B UE's on RW for support; no loss of balance noted  Careers information officer  Modified Rankin (Stroke Patients Only)       Balance Overall balance assessment: Needs assistance Sitting-balance support: No upper extremity supported;Feet supported Sitting balance-Leahy Scale: Good Sitting balance - Comments: steady sitting reaching within  BOS   Standing balance support: Single extremity supported Standing balance-Leahy Scale: Good Standing balance comment: steady standing pulling up pants                             Pertinent Vitals/Pain Pain Assessment: Faces Faces Pain Scale: Hurts a little bit Pain Location: R groin/thigh aread Pain Descriptors / Indicators: Discomfort Pain Intervention(s): Limited activity within patient's tolerance;Monitored during session;Repositioned Vitals (HR and O2 on room air) stable and WFL throughout treatment session.    Home Living Family/patient expects to be discharged to:: Private residence Living Arrangements: Alone Available Help at Discharge: Other (Comment) (pt's girlfriend) Type of Home: House Home Access: Stairs to enter Entrance Stairs-Rails: Left Entrance Stairs-Number of Steps: 4   Home Layout: One level;Laundry or work area in [employer] Equipment: Agricultural consultant (2 wheels)      Prior Function Prior Level of Function : Needs assist             Mobility Comments: Ambulatory with RW (also holds onto furniture in home as needed).  Does not currently go out of home much.  Pt reports no recent falls. ADLs Comments: Pt does not drive.  Pt orders food and pt's girlfriends picks it up for him.     Hand Dominance        Extremity/Trunk Assessment   Upper Extremity Assessment Upper Extremity Assessment: Defer to OT evaluation    Lower Extremity Assessment Lower Extremity Assessment: Generalized weakness       Communication   Communication: HOH  Cognition Arousal/Alertness: Awake/alert Behavior During Therapy: WFL for tasks assessed/performed Overall Cognitive Status: Within Functional Limits for tasks assessed                                 General Comments: Oriented to person, place, time, and situation.  Pt did not consistently answer therapists questions and tangential at times.        General Comments  Nursing cleared  pt for participation in physical therapy.  Pt agreeable to PT session.     Exercises  Transfers and ambulation   Assessment/Plan    PT Assessment Patient needs continued PT services  PT Problem List Decreased strength;Decreased activity tolerance;Decreased balance;Decreased mobility;Pain       PT Treatment Interventions DME instruction;Gait training;Stair training;Functional mobility training;Therapeutic activities;Therapeutic exercise;Balance training;Patient/family education    PT Goals (Current goals can be found in the Care Plan section)  Acute Rehab PT Goals Patient Stated Goal: to improve R groin/thigh pain PT Goal Formulation: With patient Time For Goal Achievement: 06/01/21 Potential to Achieve Goals: Good    Frequency Min 2X/week     Co-evaluation               AM-PAC PT "6 Clicks" Mobility  Outcome Measure Help needed turning from your back to your side while in a flat bed without using bedrails?: None Help needed moving from lying on your back to sitting on the side of a flat bed without using bedrails?: A Little Help needed moving to and from a bed to a chair (including a wheelchair)?: A Little Help needed standing up from a chair using your arms (  e.g., wheelchair or bedside chair)?: A Little Help needed to walk in hospital room?: A Little Help needed climbing 3-5 steps with a railing? : A Little 6 Click Score: 19    End of Session Equipment Utilized During Treatment: Gait belt Activity Tolerance: Patient tolerated treatment well Patient left:  (standing by toilet with OT (OT initiating OT evaluation)) Nurse Communication: Mobility status;Precautions PT Visit Diagnosis: Other abnormalities of gait and mobility (R26.89);Muscle weakness (generalized) (M62.81);Pain Pain - Right/Left: Right Pain - part of body: Hip    Time: [id number] PT Time Calculation (min) (ACUTE ONLY): 40 min   Charges:   PT Evaluation $PT Eval Low Complexity: 1 Low PT  Treatments $Therapeutic Activity: 23-37 mins       Benjimin Hadden, PT 05/18/21, 11:10 AM

## 2021-05-18 NOTE — Discharge Summary (Signed)
Physician Discharge Summary  Scott Cortez VHQ:[id number] DOB: May 08, 1934 DOA: 05/17/2021  PCP: [facility]  Admit date: 05/17/2021 Discharge date: 05/18/2021  Admitted From: home Disposition:  hh  Recommendations for Outpatient Follow-up:  Follow up with PCP in 1-2 weeks Please obtain BMP/CBC in one week Please follow up on the following pending results:  Home Health:yes  Equipment/Devices: RW, BSC/3 and 1  Discharge Condition: Stable Code Status:   Code Status: Full Code Diet recommendation:  Diet Order             Diet Heart Room service appropriate? Yes; Fluid consistency: Thin  Diet effective now                    Brief/Interim Summary:  88 y.o. male with medical history significant of  GERD coronary disease PCI stent x2 ,hypertension, hyperlipidemia bradycardia,  A. fib , Parkinson's disease, debility uses walker  with hx of falls multiple who has history of right lower abdominal pain, testicular pain as well as grown pain since December. Patient now presents  with 3-4 days of intense groin pain similar to prior episodes of right sided discomfort. He notes pain starts in right groin and travels over anterior thigh to knee that is sharp and made worse by certain movement. He states that he feels he over did his exercises.Patient had work up in ed with ct abd/pelvis w/o any acute findings. Patient denies fever chills, dysuria, diarrhea, injury to leg. He currently notes after iv med in ed his pain has resolved. He is in favor or admission and rehab placement if needed.   ED Course: Afeb, bp 130/94, hr78, rr 18  sat 96  Wbc:4.5,hgb 14, plt 163 CTA abd pelvis: 1. Stable appearance of the abdominal aorta with severe atherosclerotic disease and chronic focal dissection. Abdominal aortic aneurysm is 3.2 cm and stable in size. Recommend follow-up ultrasound every 3 years. This recommendation follows ACR consensus guidelines: White Paper of the ACR Incidental  Findings Committee II on Vascular Findings. J Am Coll Radiol 2013; 10:789-794. 2.  Aortic Atherosclerosis (ICD10-I70.0). 3. Stable focal bulge in the right common iliac artery which could be related to a penetrating ulcer. This has minimally changed since 2017.4. 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   NON-VASCULAR 1. No acute abnormality in the abdomen or pelvis.  Plans made to discharge patient but patient was unable to walk due to pain and felt to be an unsafe discharge as patient lives alone. Patient is admitted for pain control PT and rehab placement evaluation He was admitted overnight. Seen in the morning he was doing well had some mild right groin pain did work with PT Ambulated and has advised home health PT.  Patient attributes his groin pain due to excessive exercise worse doing at home Rest of his medical problems are chronic and stable he will continue on his home medication.  Discharge Diagnoses:   Right groin pain: possible myofascial pain syndrome/lateral cutaneous femoral nerve syndrome vs sprain from excessive exercise.  Patient extensive evaluation with CT abdomen pelvis, CT angio abdomen pelvis. seen by PT did well with ambulation wants to go home.  We will set up home health PT.   Chronic debility due to age and hx of Parkinsons: Did well with PT consult social worker he wants to go home today we will arrange home health.  He does not drive he orders his foot from outside,  has walker at home  Stable appearance of the abdominal aorta with severe atherosclerotic disease and chronic focal dissection Abdominal aortic aneurysm is 3.2 [id number] in size. Recommend follow-up ultrasound every 3 years  PAF-rate controlled  cont Eliquis for anticoagulation ,Coreg for rate controll   CAD Hx of pci with stent RCA HLD: Stable no chest pain continue his home med ARB beta-blocker statin anticoagulation  HTN: Controlled  on home med ARBs BB   Hx of CMY ef 30-35: Compensated continue Lasix, bb BPH Continue flomax  Parkinson's: continue sinement  Consults: PT  Subjective: Alert awake oriented, feels better was able to ambulate with PT.  Wants to go home today. Discharge Exam: Vitals:   05/18/21 0437 05/18/21 0747  BP: 120/65 (!) 148/76  Pulse: 72 (!) 58  Resp: 18 16  Temp: 98.5 F (36.9 C) 98.2 F (36.8 C)  SpO2: 96% 97%   General: Pt is alert, awake, not in acute distress Cardiovascular: RRR, S1/S2 +, no rubs, no gallops Respiratory: CTA bilaterally, no wheezing, no rhonchi Abdominal: Soft, NT, ND, bowel sounds + Extremities: no edema, no cyanosis  Discharge Instructions  Discharge Instructions     Discharge instructions   Complete by: As directed    Please call call MD or return to ER for similar or worsening recurring problem that brought you to hospital or if any fever,nausea/vomiting,abdominal pain, uncontrolled pain, chest pain,  shortness of breath or any other alarming symptoms.  Please follow-up your doctor as instructed in a week time and call the office for appointment.  Please avoid alcohol, smoking, or any other illicit substance and maintain healthy habits including taking your regular medications as prescribed.  You were cared for by a hospitalist during your hospital stay. If you have any questions about your discharge medications or the care you received while you were in the hospital after you are discharged, you can call the unit and ask to speak with the hospitalist on call if the hospitalist that took care of you is not available.  Once you are discharged, your primary care physician will handle any further medical issues. Please note that NO REFILLS for any discharge medications will be authorized once you are discharged, as it is imperative that you return to your primary care physician (or establish a relationship with a primary care physician if you do not have one)  for your aftercare needs so that they can reassess your need for medications and monitor your lab values   Increase activity slowly   Complete by: As directed       Allergies as of 05/18/2021       Reactions   Pneumococcal Vaccines Swelling        Medication List     TAKE these medications    apixaban 5 MG Tabs tablet Commonly known as: ELIQUIS Take 1 tablet (5 mg total) by mouth 2 (two) times daily.   atorvastatin 20 MG tablet Commonly known as: LIPITOR Take 20 mg by mouth daily.   carbidopa-levodopa 50-200 MG tablet Commonly known as: SINEMET CR Take 1 tablet by mouth at bedtime.   carbidopa-levodopa 25-100 MG tablet Commonly known as: SINEMET IR Take 2 tablets by mouth 4 (four) times daily.   carvedilol 25 MG tablet Commonly known as: COREG Take 25 mg by mouth 2 (two) times daily.   diclofenac Sodium 1 % Gel Commonly known as: Voltaren Apply 4 g topically in the morning, at noon, and at bedtime for 7 days. Apply to area  of pain in groin area   furosemide 20 MG tablet Commonly known as: LASIX Take 20 mg by mouth daily.   lidocaine 5 % Commonly known as: Lidoderm Place 1 patch onto the skin every 12 (twelve) hours. Remove & Discard patch within 12 hours or as directed by MD   losartan 25 MG tablet Commonly known as: COZAAR Take 25 mg by mouth daily.   oxyCODONE-acetaminophen 5-325 MG tablet Commonly known as: Percocet Take 1 tablet by mouth every 6 (six) hours as needed for severe pain.   polyethylene glycol 17 g packet Commonly known as: MIRALAX / GLYCOLAX Take 17 g by mouth daily.   rOPINIRole 0.25 MG tablet Commonly known as: REQUIP Take 0.25-0.5 mg by mouth at bedtime.   tamsulosin 0.4 MG Caps capsule Commonly known as: FLOMAX Take 1 capsule (0.4 mg total) by mouth daily.               Durable Medical Equipment  (From admission, onward)           Start     Ordered   05/18/21 1220  For home use only DME Walker rolling  Once        Question Answer Comment  Walker: With 5 Inch Wheels   Patient needs a walker to treat with the following condition Physical deconditioning      05/18/21 1220            Follow-up Information     [facility] EMERGENCY [facility]MENT.   Specialty: Emergency Medicine Why: If symptoms worsen Contact information: [address] [id number] [place] [address] [phone]        Schedule an appointment as soon as possible for a visit  with [facility].   Specialty: General Practice Contact information: [address] [place] [address] [phone]         Schedule an appointment as soon as possible for a visit  with Vanna [facility], MD.   Specialty: Urology Contact information: [address] [place] [address] [phone]                Allergies  Allergen Reactions   Pneumococcal Vaccines Swelling    The results of significant diagnostics from this hospitalization (including imaging, microbiology, ancillary and laboratory) are listed below for reference.    Microbiology: Recent Results (from the past 240 hour(s))  Resp Panel by RT-PCR (Flu A&B, Covid) Nasopharyngeal Swab     Status: None   Collection Time: 05/17/21  6:22 PM   Specimen: Nasopharyngeal Swab; Nasopharyngeal(NP) swabs in vial transport medium  Result Value Ref Range Status   SARS Coronavirus 2 by RT PCR NEGATIVE NEGATIVE Final    Comment: (NOTE) SARS-CoV-2 target nucleic acids are NOT DETECTED.  The SARS-CoV-2 RNA is generally detectable in upper respiratory specimens during the acute phase of infection. The lowest concentration of SARS-CoV-2 viral copies this assay can detect is 138 copies/mL. A negative result does not preclude SARS-Cov-2 infection and should not be used as the sole basis for treatment or other patient management decisions. A negative result may occur with  improper specimen  collection/handling, submission of specimen other than nasopharyngeal swab, presence of viral mutation(s) within the areas targeted by this assay, and inadequate number of viral copies(<138 copies/mL). A negative result must be combined with clinical observations, patient history, and epidemiological information. The expected result is Negative.  Fact Sheet for Patients:  BloggerCourse.com  Fact Sheet for Healthcare  Providers:  SeriousBroker.ithttps://www.fda.gov/media/152162/download  This test is no t yet approved or cleared by the [place] FDA and  has been authorized for detection and/or diagnosis of SARS-CoV-2 by FDA under an Emergency Use Authorization (EUA). This EUA will remain  in effect (meaning this test can be used) for the duration of the COVID-19 declaration under Section 564(b)(1) of the Act, 21 U.S.C.section 360bbb-3(b)(1), unless the authorization is terminated  or revoked sooner.       Influenza A by PCR NEGATIVE NEGATIVE Final   Influenza B by PCR NEGATIVE NEGATIVE Final    Comment: (NOTE) The Xpert Xpress SARS-CoV-2/FLU/RSV plus assay is intended as an aid in the diagnosis of influenza from Nasopharyngeal swab specimens and should not be used as a sole basis for treatment. Nasal washings and aspirates are unacceptable for Xpert Xpress SARS-CoV-2/FLU/RSV testing.  Fact Sheet for Patients: BloggerCourse.comhttps://www.fda.gov/media/152166/download  Fact Sheet for Healthcare Providers: SeriousBroker.ithttps://www.fda.gov/media/152162/download  This test is not yet approved or cleared by the [place] FDA and has been authorized for detection and/or diagnosis of SARS-CoV-2 by FDA under an Emergency Use Authorization (EUA). This EUA will remain in effect (meaning this test can be used) for the duration of the COVID-19 declaration under Section 564(b)(1) of the Act, 21 U.S.C. section 360bbb-3(b)(1), unless the authorization is terminated or revoked.  Performed at [facility], [address] [place], [place] [id number]     Procedures/Studies: CT ABDOMEN PELVIS W CONTRAST  Result Date: 05/05/2021 CLINICAL DATA:  Right lower quadrant pain, initial encounter EXAM: CT ABDOMEN AND PELVIS WITH CONTRAST TECHNIQUE: Multidetector CT imaging of the abdomen and pelvis was performed using the standard protocol following bolus administration of intravenous contrast. CONTRAST:  80mL OMNIPAQUE IOHEXOL 300 MG/ML  SOLN COMPARISON:  01/09/2016 FINDINGS: Lower chest: Lungs are well aerated bilaterally. No focal infiltrate or sizable effusion is seen. Hepatobiliary: No focal liver abnormality is seen. No gallstones, gallbladder wall thickening, or biliary dilatation. Pancreas: Unremarkable. No pancreatic ductal dilatation or surrounding inflammatory changes. Spleen: Normal in size without focal abnormality. Adrenals/Urinary Tract: Adrenal glands are within normal limits bilaterally. No renal calculi or obstructive changes are seen. Normal enhancement of the kidneys is seen bilaterally. Normal excretion is seen bilaterally as well. Bladder is within normal limits. Stomach/Bowel: Scattered diverticular changes noted without evidence of diverticulitis. No obstructive or inflammatory changes are seen. Mild retained colonic fecal material is noted. The appendix is within normal limits. Small bowel and stomach are unremarkable. Vascular/Lymphatic: Atherosclerotic calcifications of the abdominal aorta are noted. Mild ectasia to 2.8 cm is noted. There are changes consistent with a prior dissection with patency of the false lumen. This has progressed somewhat in the interval from the prior exam. Diffuse atherosclerotic calcifications of the iliac vessels are seen as well. Reproductive: Prostate is unremarkable. Other: No abdominal wall hernia or abnormality. No abdominopelvic ascites. Musculoskeletal: Degenerative changes of lumbar spine are noted. IMPRESSION: Normal-appearing appendix. No  discrete abnormality in the right lower quadrant is identified to correspond with the given clinical history. Ectasias of the abdominal aorta to 2.8 cm. 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 This has progressed in size somewhat when compared with the prior exam of 2017. No acute abnormality is identified. Diverticulosis without diverticulitis. Electronically Signed   By: Alcide CleverMark  Lukens M.D.   On: 05/05/2021 22:53   US SCROTUM W/DOPPLER  Result Date: 05/10/2021 CLINICAL DATA:  Right groin and pelvic pain EXAM: SCROTAL ULTRASOUND DOPPLER ULTRASOUND OF THE TESTICLES TECHNIQUE: Complete  ultrasound examination of the testicles, epididymis, and other scrotal structures was performed. Color and spectral Doppler ultrasound were also utilized to evaluate blood flow to the testicles. COMPARISON:  CT 05/05/2021 and previous FINDINGS: Right testicle Measurements: 4.6 x 1.7 x 2.6 cm. No mass or microlithiasis visualized. Left testicle Measurements: 4.6 x 1.6 x 2.4 cm. No mass or microlithiasis visualized. Right epididymis: 0.4 cm simple cyst in the epididymal head. Otherwise normal in size and appearance. Left epididymis: 0.6 cm simple cyst in the epididymal head. Otherwise normal in size and appearance. Hydrocele:  None visualized. Varicocele:  None visualized. Pulsed Doppler interrogation of both testes demonstrates normal low resistance arterial and venous waveforms bilaterally. IMPRESSION: 1. Normal bilateral testes. 2. Small benign cysts in the  epididymal heads. Electronically Signed   By: Corlis Leak M.D.   On: 05/10/2021 11:51   CT Angio Abd/Pel W and/or Wo Contrast  Result Date: 05/17/2021 CLINICAL DATA:  Iliac artery dissection. Complains of right femoral area pain with movement. EXAM: CT ANGIOGRAPHY ABDOMEN AND PELVIS WITH CONTRAST AND WITHOUT CONTRAST TECHNIQUE: Multidetector CT imaging of the abdomen and pelvis was performed using the standard protocol during bolus  administration of intravenous contrast. Multiplanar reconstructed images and MIPs were obtained and reviewed to evaluate the vascular anatomy. CONTRAST:  OMNIPAQUE IOHEXOL 350 MG/ML SOLN COMPARISON:  05/05/2021 and CTA from 12/31/2015 FINDINGS: VASCULAR Aorta: Distal thoracic aorta measures 3.0 cm. Severe atherosclerotic disease in the infrarenal abdominal aorta with evidence of a chronic dissection flap. Irregular mural thrombus throughout the infrarenal abdominal aorta. The distal abdominal aorta is aneurysmal measuring up to 3.2 cm and stable. Celiac: Patent without evidence of aneurysm, dissection, vasculitis or significant stenosis. SMA: Patent without evidence of aneurysm, dissection, vasculitis or significant stenosis. Renals: Both renal arteries are patent without evidence of aneurysm, dissection, vasculitis, fibromuscular dysplasia or significant stenosis. IMA: Patent Inflow: Again noted is a focal bulge along the medial aspect of the right common iliac artery suggestive for a small penetrating ulcer. The bulge measures roughly 0.7 cm and combined dimension of the common iliac artery with the focal bulge measures up to 1.8 cm and these findings have minimally changed since 2017. Mid right common iliac artery measures 2.0 cm. Right internal and right external iliac arteries are patent without significant stenosis. Left common iliac artery is tortuous but patent. Left common iliac artery measures up to 1.7 cm. Left internal and left external iliac arteries are patent. Aneurysm in the left internal iliac artery measures up to 1.7 cm and measured roughly 1.5 cm in 2017. Proximal Outflow: Left common femoral artery is ectatic measuring up to 1.5 cm. Proximal femoral arteries are patent bilaterally. Superior femoral arteries are patent bilaterally. Veins: Iliac veins and IVC are patent. Renal veins are patent. Main portal venous system is patent. Review of the MIP images confirms the above findings.  NON-VASCULAR Lower chest: Chronic cystic changes along the medial left lower lobe. No pleural effusions. Hepatobiliary: Normal appearance of the liver, gallbladder and portal venous system. Pancreas: Normal appearance of the pancreas without inflammation or duct dilatation. Spleen: Normal appearance of spleen without enlargement. Adrenals/Urinary Tract: Normal appearance the adrenal glands. Normal appearance of both kidneys without hydronephrosis. Normal appearance of the urinary bladder. Stomach/Bowel: Colonic diverticulosis without acute inflammation. Normal appearance of the stomach. Lymphatic: No lymph node enlargement in the abdomen or pelvis. Reproductive: Prostate is unremarkable. Other: No free fluid.  Negative for free air. Musculoskeletal: Chronic disc space narrowing at L5-S1. Mild chronic irregularity along  the superior endplate of L3 which is minimally changed. Again noted is a compression deformity or large Schmorl's node along the inferior endplate of T12. IMPRESSION: VASCULAR 1. Stable appearance of the abdominal aorta with severe atherosclerotic disease and chronic focal dissection. Abdominal aortic aneurysm is 3.2 cm and stable in size. Recommend follow-up ultrasound every 3 years. This recommendation follows ACR consensus guidelines: White Paper of the ACR Incidental Findings Committee II on Vascular Findings. J Am Coll Radiol 2013; 10:789-794. 2.  Aortic Atherosclerosis (ICD10-I70.0). 3. Stable focal bulge in the right common iliac artery which could be related to a penetrating ulcer. This has minimally changed since 2017. 4. 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 NON-VASCULAR 1. No acute abnormality in the abdomen or pelvis. Electronically Signed   By: Richarda Overlie M.D.   On: 05/17/2021 16:10    Labs: BNP (last 3 results) No results for input(s): BNP in the last 8760 hours. Basic Metabolic Panel: Recent Labs  Lab 05/17/21 1403  05/18/21 0626  NA 135 135  K 4.2 3.7  CL 101 99  CO2 28 28  GLUCOSE 98 153*  BUN 23 22  CREATININE 0.83 0.94  CALCIUM 9.2 8.7*   Liver Function Tests: Recent Labs  Lab 05/18/21 0626  AST 23  ALT 9  ALKPHOS 73  BILITOT 2.1*  PROT 5.9*  ALBUMIN 3.5   No results for input(s): LIPASE, AMYLASE in the last 168 hours. No results for input(s): AMMONIA in the last 168 hours. CBC: Recent Labs  Lab 05/17/21 1403 05/18/21 0626  WBC 4.5 4.4  NEUTROABS 3.1  --   HGB 14.0 13.4  HCT 39.9 38.3*  MCV 95.5 95.0  PLT 163 157   Cardiac Enzymes: No results for input(s): CKTOTAL, CKMB, CKMBINDEX, TROPONINI in the last 168 hours. BNP: Invalid input(s): POCBNP CBG: No results for input(s): GLUCAP in the last 168 hours. D-Dimer No results for input(s): DDIMER in the last 72 hours. Hgb A1c No results for input(s): HGBA1C in the last 72 hours. Lipid Profile No results for input(s): CHOL, HDL, LDLCALC, TRIG, CHOLHDL, LDLDIRECT in the last 72 hours. Thyroid function studies No results for input(s): TSH, T4TOTAL, T3FREE, THYROIDAB in the last 72 hours.  Invalid input(s): FREET3 Anemia work up No results for input(s): VITAMINB12, FOLATE, FERRITIN, TIBC, IRON, RETICCTPCT in the last 72 hours. Urinalysis    Component Value Date/Time   COLORURINE YELLOW (A) 05/17/2021 1403   APPEARANCEUR HAZY (A) 05/17/2021 1403   APPEARANCEUR Clear 10/24/2011 2127   LABSPEC 1.023 05/17/2021 1403   LABSPEC 1.003 10/24/2011 2127   PHURINE 6.0 05/17/2021 1403   GLUCOSEU NEGATIVE 05/17/2021 1403   GLUCOSEU Negative 10/24/2011 2127   HGBUR NEGATIVE 05/17/2021 1403   BILIRUBINUR NEGATIVE 05/17/2021 1403   BILIRUBINUR Negative 10/24/2011 2127   KETONESUR 5 (A) 05/17/2021 1403   PROTEINUR NEGATIVE 05/17/2021 1403   NITRITE NEGATIVE 05/17/2021 1403   LEUKOCYTESUR NEGATIVE 05/17/2021 1403   LEUKOCYTESUR Negative 10/24/2011 2127   Sepsis Labs Invalid input(s): PROCALCITONIN,  WBC,   LACTICIDVEN Microbiology Recent Results (from the past 240 hour(s))  Resp Panel by RT-PCR (Flu A&B, Covid) Nasopharyngeal Swab     Status: None   Collection Time: 05/17/21  6:22 PM   Specimen: Nasopharyngeal Swab; Nasopharyngeal(NP) swabs in vial transport medium  Result Value Ref Range Status   SARS Coronavirus 2 by RT PCR NEGATIVE NEGATIVE Final    Comment: (NOTE) SARS-CoV-2 target nucleic acids are NOT  DETECTED.  The SARS-CoV-2 RNA is generally detectable in upper respiratory specimens during the acute phase of infection. The lowest concentration of SARS-CoV-2 viral copies this assay can detect is 138 copies/mL. A negative result does not preclude SARS-Cov-2 infection and should not be used as the sole basis for treatment or other patient management decisions. A negative result may occur with  improper specimen collection/handling, submission of specimen other than nasopharyngeal swab, presence of viral mutation(s) within the areas targeted by this assay, and inadequate number of viral copies(<138 copies/mL). A negative result must be combined with clinical observations, patient history, and epidemiological information. The expected result is Negative.  Fact Sheet for Patients:  BloggerCourse.comhttps://www.fda.gov/media/152166/download  Fact Sheet for Healthcare Providers:  SeriousBroker.ithttps://www.fda.gov/media/152162/download  This test is no t yet approved or cleared by the [place] FDA and  has been authorized for detection and/or diagnosis of SARS-CoV-2 by FDA under an Emergency Use Authorization (EUA). This EUA will remain  in effect (meaning this test can be used) for the duration of the COVID-19 declaration under Section 564(b)(1) of the Act, 21 U.S.C.section 360bbb-3(b)(1), unless the authorization is terminated  or revoked sooner.       Influenza A by PCR NEGATIVE NEGATIVE Final   Influenza B by PCR NEGATIVE NEGATIVE Final    Comment: (NOTE) The Xpert Xpress SARS-CoV-2/FLU/RSV plus  assay is intended as an aid in the diagnosis of influenza from Nasopharyngeal swab specimens and should not be used as a sole basis for treatment. Nasal washings and aspirates are unacceptable for Xpert Xpress SARS-CoV-2/FLU/RSV testing.  Fact Sheet for Patients: BloggerCourse.comhttps://www.fda.gov/media/152166/download  Fact Sheet for Healthcare Providers: SeriousBroker.ithttps://www.fda.gov/media/152162/download  This test is not yet approved or cleared by the [place] FDA and has been authorized for detection and/or diagnosis of SARS-CoV-2 by FDA under an Emergency Use Authorization (EUA). This EUA will remain in effect (meaning this test can be used) for the duration of the COVID-19 declaration under Section 564(b)(1) of the Act, 21 U.S.C. section 360bbb-3(b)(1), unless the authorization is terminated or revoked.  Performed at [facility] [facility], [address] [place], [place] [address]      Time coordinating discharge: 25 minutes  SIGNED: Lanae Boastamesh Elsey Holts, MD  [facility] 05/18/2021, 12:29 PM  If 7PM-7AM, please contact night-coverage [email or web address]

## 2021-05-18 NOTE — Evaluation (Signed)
Occupational Therapy Evaluation Patient Details Name: Scott Cortez MRN: [id number] DOB: Feb 23, 1934 Today's Date: 05/18/2021   History of Present Illness Pt is an 88 y.o. male presenting to hospital 05/17/20 with c/o R groin pain and inguinal crease extending down into R thigh.  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  PMH includes htn, HLD, CAD, CHF, urinary bladder obstruction, Parkinson's disease, h/o falls.   Clinical Impression   Patient presenting with decreased Ind in self care, balance, functional mobility/transfers, endurance, and safety awareness. Patient reports living at home alone and being mod I with use of RW PTA. Pt is very particular and regimental about how he likes to do things and has things specifically placed at home. He does ask therapist to not talk to him while he is trying to concentrate on ambulation to limiting external distractions. Patient currently functioning at supervision - min guard. He does report a girlfriend can provide some assistance as needed at discharge.  Patient will benefit from acute OT to increase overall independence in the areas of ADLs, functional mobility, and safety awareness in order to safely discharge home.      Recommendations for follow up therapy are one component of a multi-disciplinary discharge planning process, led by the attending physician.  Recommendations may be updated based on patient status, additional functional criteria and insurance authorization.   Follow Up Recommendations  Home health OT    Assistance Recommended at Discharge Intermittent Supervision/Assistance  Patient can return home with the following A little help with walking and/or transfers;A little help with bathing/dressing/bathroom    Functional Status Assessment  Patient has had a recent  decline in their functional status and demonstrates the ability to make significant improvements in function in a reasonable and predictable amount of time.  Equipment Recommendations  Tub/shower seat       Precautions / Restrictions Precautions Precautions: Fall Restrictions Weight Bearing Restrictions: No      Mobility Bed Mobility Overal bed mobility: Needs Assistance Bed Mobility: Supine to Sit     Supine to sit: Supervision;HOB elevated     General bed mobility comments: mild increased effort/time for pt to perform on own    Transfers Overall transfer level: Needs assistance Equipment used: Rolling walker (2 wheels) Transfers: Sit to/from Stand Sit to Stand: Min guard           General transfer comment: x1 trial from stretcher bed (mild increased effort to stand but steady)      Balance Overall balance assessment: Needs assistance Sitting-balance support: No upper extremity supported;Feet supported Sitting balance-Leahy Scale: Good Sitting balance - Comments: steady sitting reaching within BOS   Standing balance support: Single extremity supported Standing balance-Leahy Scale: Fair Standing balance comment: steady standing pulling up pants                           ADL either performed or assessed with clinical judgement   ADL Overall ADL's : Needs assistance/impaired                                     Functional mobility during ADLs: Min guard;Rolling walker (2 wheels) General ADL Comments: min guard for balance with standing tasks and use  of RW.     Vision Patient Visual Report: No change from baseline              Pertinent Vitals/Pain Pain Assessment: Faces Faces Pain Scale: Hurts a little bit Pain Location: R groin/thigh aread Pain Descriptors / Indicators: Discomfort Pain Intervention(s): Limited activity within patient's tolerance;Monitored during session;Repositioned        Extremity/Trunk Assessment  Upper Extremity Assessment Upper Extremity Assessment: Defer to OT evaluation   Lower Extremity Assessment Lower Extremity Assessment: Generalized weakness       Communication Communication Communication: HOH   Cognition Arousal/Alertness: Awake/alert Behavior During Therapy: WFL for tasks assessed/performed Overall Cognitive Status: Within Functional Limits for tasks assessed                                 General Comments: Oriented to person, place, time, and situation.  Pt did not consistently answer therapists questions and tangential at times.                Home Living Family/patient expects to be discharged to:: Private residence Living Arrangements: Alone Available Help at Discharge: Other (Comment) (pt's girlfriend) Type of Home: House Home Access: Stairs to enter Entergy CorporationEntrance Stairs-Number of Steps: 4 Entrance Stairs-Rails: Left Home Layout: One level;Laundry or work area in basement     Foot LockerBathroom Shower/Tub: Producer, television/film/videoWalk-in shower   Bathroom Toilet: Standard     Home Equipment: Agricultural consultantolling Walker (2 wheels)          Prior Functioning/Environment Prior Level of Function : Needs assist             Mobility Comments: Ambulatory with RW (also holds onto furniture in home as needed).  Does not currently go out of home much.  Pt reports no recent falls. ADLs Comments: Pt does not drive.  Pt orders food and pt's girlfriends picks it up for him. Pt has a very specific way of doing things at home.        OT Problem List: Decreased strength;Decreased activity tolerance;Impaired balance (sitting and/or standing);Decreased safety awareness;Pain;Decreased knowledge of use of DME or AE      OT Treatment/Interventions: Self-care/ADL training;Therapeutic exercise;Energy conservation;DME and/or AE instruction;Therapeutic activities;Balance training;Patient/family education    OT Goals(Current goals can be found in the care plan section) Acute Rehab OT  Goals Patient Stated Goal: to go home OT Goal Formulation: With patient Time For Goal Achievement: 06/01/21 Potential to Achieve Goals: Good ADL Goals Pt Will Perform Grooming: with modified independence;standing Pt Will Perform Lower Body Dressing: with modified independence;sit to/from stand Pt Will Transfer to Toilet: with modified independence;ambulating Pt Will Perform Toileting - Clothing Manipulation and hygiene: with modified independence;sit to/from stand  OT Frequency: Min 2X/week       AM-PAC OT "6 Clicks" Daily Activity     Outcome Measure Help from another person eating meals?: None Help from another person taking care of personal grooming?: None Help from another person toileting, which includes using toliet, bedpan, or urinal?: A Little Help from another person bathing (including washing, rinsing, drying)?: A Little Help from another person to put on and taking off regular upper body clothing?: None Help from another person to put on and taking off regular lower body clothing?: A Little 6 Click Score: 21   End of Session Equipment Utilized During Treatment: Rolling walker (2 wheels) Nurse Communication: Mobility status  Activity Tolerance: Patient tolerated treatment well Patient left: in bed;with call bell/phone  within reach  OT Visit Diagnosis: Unsteadiness on feet (R26.81);Muscle weakness (generalized) (M62.81)                Time: 4580-9983 OT Time Calculation (min): 20 min Charges:  OT General Charges $OT Visit: 1 Visit OT Evaluation $OT Eval Moderate Complexity: 1 Mod OT Treatments $Self Care/Home Management : 8-22 mins  Jackquline Denmark, MS, OTR/L , CBIS ascom [phone]  05/18/21, 11:23 AM

## 2021-05-18 NOTE — Care Management CC44 (Signed)
Condition Code 44 Documentation Completed  Patient Details  Name: Scott Cortez MRN: [id number] Date of Birth: 03-06-1934   Condition Code 44 given:  Yes Patient signature on Condition Code 44 notice:  Yes Documentation of 2 MD's agreement:  Yes Code 44 added to claim:  Yes    Shelbie Hutching, RN 05/18/2021, 12:24 PM

## 2021-05-18 NOTE — Progress Notes (Signed)
Iv removed from patient. Discharge instructions given to patient. Verbalized understanding. No acute distress at this time. Patient awaiting transportation home.

## 2021-05-18 NOTE — TOC Transition Note (Signed)
Transition of Care [facility]) - CM/SW Discharge Note   Patient Details  Name: Scott Cortez MRN: [id number] Date of Birth: [birth year]  [facility] CM/SW Contact:  Allayne Butcher, RN Phone Number: 05/18/2021, 12:41 PM   Clinical Narrative:     Patient admitted to the hospital and then switched to observation for right groin pain.  Code 44 reviewed with patient at the bedside.  He is ready to go home, he will call someone to come and pick him up.  Medically cleared for discharge.  He lives alone and has a walker at home.  He declines a 3 in 1 because there is a co pay.  He does agree to home health services as long as it is covered by his Medicare.  RNCM assured patient that home health is covered at 100% by his Medicare.   Patient has no preference in agency.  [facility] for [facility].    No other discharge needs identified.   Final next level of care: Home w Home Health Services Barriers to Discharge: Barriers Resolved   Patient Goals and CMS Choice Patient states their goals for this hospitalization and ongoing recovery are:: Patient wants to go home CMS Medicare.gov Compare Post Acute Care list provided to:: Patient Choice offered to / list presented to : Patient  Discharge Placement                       Discharge Plan and Services   Discharge Planning Services: CM Consult Post Acute Care Choice: Home Health          DME Arranged: N/A DME Agency: NA       HH Arranged: PT HH Agency: [facility] Date HH Agency Contacted: 05/18/21 Time HH Agency Contacted: 1240 Representative spoke with at [facility] Agency: Feliberto Gottron  Social Determinants of Health (SDOH) Interventions     Readmission Risk Interventions No flowsheet data found.

## 2021-07-12 ENCOUNTER — Ambulatory Visit: Payer: Self-pay | Admitting: Surgery

## 2021-07-19 ENCOUNTER — Other Ambulatory Visit
Admission: RE | Admit: 2021-07-19 | Discharge: 2021-07-19 | Disposition: A | Discharge status: Home / Self Care | Payer: Medicare Other | Source: Ambulatory Visit | Attending: Surgery | Admitting: Surgery

## 2021-07-19 ENCOUNTER — Other Ambulatory Visit: Payer: Self-pay

## 2021-07-19 HISTORY — DX: Parkinson's disease: G20

## 2021-07-19 HISTORY — DX: Parkinson's disease without dyskinesia, without mention of fluctuations: G20.A1

## 2021-07-19 NOTE — Patient Instructions (Addendum)
?Your procedure is scheduled on: Thursday July 27, 2021. ?Report to Day Surgery inside [facility] 2nd floor, stop by admissions desk before getting on elevator.  ?To find out your arrival time please call [phone] between 1PM - 3PM on Wednesday July 26, 2021. ? ?Remember: Instructions that are not followed completely may result in serious medical risk,  ?up to and including death, or upon the discretion of your surgeon and anesthesiologist your  ?surgery may need to be rescheduled.  ? ?  _X__ 1. Do not eat food after midnight the night before your procedure. ?                No chewing gum or hard candies. You may drink clear liquids up to 2 hours ?                before you are scheduled to arrive for your surgery- DO not drink clear ?                liquids within 2 hours of the start of your surgery. ?                Clear Liquids include:  water, apple juice without pulp, clear Gatorade, G2 or  ?                Gatorade Zero (avoid Red/Purple/Blue), Black Coffee or Tea (Do not add ?                anything to coffee or tea). ? ?__X__2.  On the morning of surgery brush your teeth with toothpaste and water, you ?               may rinse your mouth with mouthwash if you wish.  Do not swallow any toothpaste or mouthwash. ?   ? _X__ 3.  No Alcohol for 24 hours before or after surgery. ? ? _X__ 4.  Do Not Smoke or use e-cigarettes For 24 Hours Prior to Your Surgery. ?                Do not use any chewable tobacco products for at least 6 hours prior to ?                Surgery. ? ?_X__  5.  Do not use any recreational drugs (marijuana, cocaine, heroin, ecstasy, MDMA or other) ?               For at least one week prior to your surgery.  Combination of these drugs with anesthesia ?               May have life threatening results. ? ?____  6.  Bring all medications with you on the day of surgery if instructed.  ? ?__X__  7.  Notify your doctor if there is any change in your medical  condition  ?    (cold, fever, infections). ?    ?Do not wear jewelry, make-up, hairpins, clips or nail polish. ?Do not wear lotions, powders, or perfumes. You may wear deodorant. ?Do not shave 48 hours prior to surgery. Men may shave face and neck. ?Do not bring valuables to the hospital.   ? ?[facility] is not responsible for any belongings or valuables. ? ?Contacts, dentures or bridgework may not be worn into surgery. ?Leave your suitcase in the car. After surgery it may be brought to your room. ?For patients admitted to the hospital, discharge time is  determined by your ?treatment team. ?  ?Patients discharged the day of surgery will not be allowed to drive home.   ?Make arrangements for someone to be with you for the first 24 hours of your ?Same Day Discharge. ? ? ?__X__ Take these medicines the morning of surgery with A SIP OF WATER:  ? ? 1. carbidopa-levodopa (SINEMET IR) 100-200 MG ? 2. carvedilol (COREG) 12.5 MG  ? 3.  ? 4. ? 5. ? 6. ? ?____ Fleet Enema (as directed)  ? ?__X__ Use CHG Soap (or wipes) as directed ? ?____ Use Benzoyl Peroxide Gel as instructed ? ?____ Use inhalers on the day of surgery ? ?____ Stop metformin 2 days prior to surgery   ? ?____ Take 1/2 of usual insulin dose the night before surgery. No insulin the morning ?         of surgery.  ? ?__X__ Stop apixaban (ELIQUIS) 5 MG as instructed by your doctor before your surgery.  ? ?__X__ One Week prior to surgery- Stop Anti-inflammatories such as Ibuprofen, Aleve, Advil, Motrin, meloxicam (MOBIC), diclofenac, etodolac, ketorolac, Toradol, Daypro, piroxicam, Goody's or BC powders. OK TO USE TYLENOL IF NEEDED ?  ?__X__ Stop supplements until after surgery.   ? ?____ Bring C-Pap to the hospital.  ? ? ?If you have any questions regarding your pre-procedure instructions,  ?Please call Pre-admit Testing at [phone] ?

## 2021-07-20 ENCOUNTER — Encounter: Payer: Self-pay | Admitting: Surgery

## 2021-07-21 ENCOUNTER — Encounter: Payer: Self-pay | Admitting: Surgery

## 2021-07-21 NOTE — Progress Notes (Addendum)
Perioperative Services  Pre-Admission/Anesthesia Testing Clinical Review  Date: 07/21/21  Patient Demographics:  Name: Scott Cortez DOB:   [birth year] MRN:   [id number]  Planned Surgical Procedure(s):    Case: [id number] Date/Time: 07/27/21 1103   Procedure: XI ROBOTIC ASSISTED INGUINAL HERNIA (Right: Groin)   Anesthesia type: General   Pre-op diagnosis: Non-recurrent unilateral inguinal hernia without obstruction or gangrene K40.90   Location: [facility] OR ROOM 06 / [place] [facility] FOR ANESTHESIA GROUP   Surgeons: Benjamine Sprague, DO   NOTE: Available PAT nursing documentation and vital signs have been reviewed. Clinical nursing staff has updated patient's PMH/PSHx, current medication list, and drug allergies/intolerances to ensure comprehensive history available to assist in medical decision making as it pertains to the aforementioned surgical procedure and anticipated anesthetic course. Extensive review of available clinical information performed. Scott Cortez PMH and PSHx updated with any diagnoses/procedures that  may have been inadvertently omitted during his intake with the pre-admission testing department's nursing staff.  Clinical Discussion:  Scott Cortez is a 88 y.o. male who is submitted for pre-surgical anesthesia review and clearance prior to him undergoing the above procedure. Patient is a Former Smoker (quit 05/1973). Pertinent PMH includes: CAD, MI x 2, cardiomyopathy, atrial fibrillation, bradycardia, cardiac murmur, CHF, AAA, LEFT iliac artery aneurysm, HTN, HLD, GERD (no daily Tx), OA Parkinson's disease, peripheral neuropathy, BPH, RLS, frequent falls, depression.  Patient is followed by cardiology Clayborn Bigness, MD). He was last seen in the cardiology clinic on 07/10/2021; notes reviewed.  At the time of his clinic visit, patient doing well overall from a cardiovascular perspective.  He denied any episodes of chest pain, shortness of breath, PND, orthopnea, palpitations, significant  peripheral edema, vertiginous symptoms, or presyncope/syncope.  Patient with a past medical history significant for cardiovascular diagnoses.  Of note, complete records regarding patient's past cardiovascular history unavailable for review at time of consult.  Information obtained from patient and notes from local primary cardiologist.  Patient suffered an inferior wall MI on 11/19/2003.  Diagnostic left heart catheterization was performed on 11/22/2003 revealing a reduced LVEF of 49%.  There was multivessel CAD; 20% proximal RCA, 90% mid RCA, 20% LM, 20% proximal LCx, 20% mid LAD, and 20% D1.  Coronary anatomy not amenable to PCI at [facility], thus patient transferred to [facility] for further evaluation and treatment.  While at [facility], patient underwent PCI and placement of a stent (unknown type) to his RCA.  Patient suffered a inferoposterolateral MI on 05/02/2007.  Patient was treated with PCI and stenting (unknown type) of the RCA.  Last cardiac catheterization was performed on 02/02/2009 revealing a 50% lesion to the RPLS.  Further intervention was deferred opting for medical management.  Myocardial perfusion imaging study performed on 01/04/2014 revealing a reduced LVEF of 45-50%.  There was hypokinesis of the inferior posterior wall.  Left ventricular cavity noted to be enlarged.  There was no evidence of stress-induced myocardial ischemia or arrhythmia. Medical management recommended.  CT imaging performed on 12/31/2015 revealed an incidental finding of a 3.1 cm AAA.  Surveillance of aneurysmal defect has been performed via CT.  Most recent CTA was performed on 05/17/2021 revealing overall stability of the noted AAA; measured 3.2 cm. Surveillance to continue regularly intervals.  Most recent TTE was performed on 09/26/2020 revealing a significantly reduced left ventricular systolic function with an EF of 30%.  There was mild LVH and left atrial enlargement.  There was mild mitral, tricuspid, and pulmonary  valve regurgitation.  There was no evidence  of a significant transvalvular gradient to suggest stenosis.  Patient with an atrial fibrillation diagnosis; CHA2DS2-VASc Score = 5 (age x 2, CHF, HTN, previous MI/aortic plaque).  Rate and rhythm maintained on oral carvedilol.  Patient is chronically anticoagulated using daily apixaban; compliant with therapy with no evidence or reports of GI bleeding.  Blood pressure well controlled at 124/76 on beta-blocker, diuretic, and ARB therapies.  Patient is on a statin for his HLD.  He is not diabetic.  Functional capacity limited by age-related debility, frequent falls, Parkinson's disease, and other various medical comorbidities.  Patient questionably able to achieve 4 METS of activity without angina/anginal equivalent symptoms due to the aforementioned.  No changes were made to patient's medication regimen.  Patient follow-up with outpatient cardiology in 4 months or sooner if needed.  Scott Cortez is scheduled for an elective ROBOTIC ASSISTED RIGHT INGUINAL HERNIA REPAIR on 07/27/2021 with Dr. Benjamine Sprague, MD. Given patient's past medical history significant for cardiovascular diagnoses, presurgical cardiac clearance was sought by the performing surgeon's office and PAT team. Per cardiology, "this patient is optimized for surgery and may proceed with the planned procedural course with an ACCEPTABLE risk of significant perioperative cardiovascular complications".  Again, this patient is on daily anticoagulation therapy.  He has been instructed on recommendations from his cardiologist for holding his apixaban for 3 days prior to his procedure with plans to restart as soon as postoperative bleeding risk felt to be minimized by his primary attending surgeon.  The patient is aware that his last dose of apixaban should be on 07/23/2021.    Patient denies previous perioperative complications with anesthesia in the past. In review of the available records, it is noted that  patient underwent a MAC anesthetic course at [facility] (ASA III) in 02/2019 without documented complications.   Vitals with BMI 07/19/2021 05/18/2021 05/18/2021  Height _0  - -  Weight 155 lbs - -  BMI 70.17 - -  Systolic - 793 903  Diastolic - 76 65  Pulse - 58 72    Providers/Specialists:   NOTE: Primary physician provider listed below. Patient may have been seen by APP or partner within same practice.   PROVIDER ROLE / SPECIALTY LAST Shirline Frees, DO General Surgery (Surgeon) 07/12/2021  Center, [facility] Primary Care Provider ???  Katrine Coho, MD Cardiology 07/10/2021  Jennings Books, MD Neurology 05/31/2021   Allergies:  Pneumococcal vaccines  Current Home Medications:   No current facility-administered medications for this encounter.    acetaminophen (TYLENOL) 500 MG tablet   apixaban (ELIQUIS) 5 MG TABS tablet   atorvastatin (LIPITOR) 20 MG tablet   carbidopa-levodopa (SINEMET CR) 50-200 MG tablet   carbidopa-levodopa (SINEMET IR) 25-100 MG tablet   carvedilol (COREG) 12.5 MG tablet   furosemide (LASIX) 20 MG tablet   losartan (COZAAR) 25 MG tablet   polyethylene glycol (MIRALAX / GLYCOLAX) 17 g packet   rOPINIRole (REQUIP) 0.25 MG tablet   tamsulosin (FLOMAX) 0.4 MG CAPS capsule   lidocaine (LIDODERM) 5 %   History:   Past Medical History:  Diagnosis Date   AAA (abdominal aortic aneurysm)    a.) CTA 12/31/2015: measured 3.1 cm. b.) CTA 03/07/2019: measured 3.5 cm. c.) CT abdomen 05/05/2021: measured  2.8 cm. d.) CTA 05/17/2021: measured 3.2 cm   Acute MI, inferior wall (HCC) 11/19/2003   a.) LHC 11/22/2003: EF 49%; 20% pRCA, 90% mRCA, 20% LM, 20% pLCx, 20% mLAD, 20% D1; unable to perform PCI -->  transferred to [facility] and stent (unknown type) was placed to the RCA.   Aortic atherosclerosis (HCC)    Atrial fibrillation (HCC)    a.) CHA2DS2-VASc = 5 (age x 2, CHF, HTN, previous MI/aortic plaque). b.) rate/hythm maintained on oral  carvedilol; chronically anticoagulated using apixaban.   Bladder neck obstruction    BPH (benign prostatic hyperplasia)    Bradycardia    Cardiac murmur    Cardiomyopathy (HCC)    CHF (congestive heart failure) ([place])    a.) TTE 01/05/2014: EF 50%; mild LA enlargement; triv AR//PR, mild MR/TR; G1DD. b.) TTE 03/08/2019: EF 60-65%; RV mildly enlarged. c.) TTE 09/26/2020: EF 30%; mild LVH; mild LA enlargement; mild MR/TR/PR.   Chronic pancreatitis (HCC)    Coronary artery disease    a.) inferior wall MI 11/19/2003; LHC 11/22/2003: EF 49%; 20% pRCA, 90% mRCA, 20% LM, 20% pLCx, 20% mLAD, 20% D1; unable to perform PCI --> transferred to [facility] and stent (unknown type) was placed to the RCA. b.) MI 2007/2008 (date unknown); PCI placing a second stent (unknown type) to RCA. c.) LHC 02/02/2009: 50% RPLS; intervention deferred opting for med mgmt.   Current use of long term anticoagulation    a.) apixaban   Dental bridge present    upper - bilateral   Depression    Frequent falls    GERD (gastroesophageal reflux disease)    Glaucoma    Hyperlipidemia    Hypertension    Iliac artery aneurysm, left ([place])    a.)  CTA 05/17/2021: measured 1.7 cm; previously 1.5 cm in 2015.   MI (myocardial infarction) ([place]) 05/02/2007   a.) inferoposterolateral MI 05/02/2007 - PCI placing a stent (unknown type) to the RCA   Neuropathy of both feet    Osteoarthritis    Parkinson's disease ([place])    RLS (restless legs syndrome)    a.) takes ropinirole   Past Surgical History:  Procedure Laterality Date   CATARACT EXTRACTION W/PHACO Left 02/02/2019   Procedure: CATARACT EXTRACTION PHACO AND INTRAOCULAR LENS PLACEMENT (IOC) LEFT ISTENT INJ AND ISTENT 01:09.1        11.3%          8.47;  Surgeon: [name], MD;  Location: [place];  Service: Ophthalmology;  Laterality: Left;   CATARACT EXTRACTION W/PHACO Right 02/23/2019   Procedure: CATARACT EXTRACTION PHACO AND INTRAOCULAR LENS PLACEMENT (IOC) RIGHT  ISTENT INJ 01:09.1  14.1%  9.78;  Surgeon: [name], MD;  Location: [place];  Service: Ophthalmology;  Laterality: Right;   COLONOSCOPY N/A 10/15/2014   Procedure: COLONOSCOPY;  Surgeon: [name], MD;  Location: [facility] ENDOSCOPY;  Service: Endoscopy;  Laterality: N/A;   CORONARY ANGIOPLASTY WITH STENT PLACEMENT Left 11/16/2003   PCI and stent placement to the RCA (unknown type)   CORONARY ANGIOPLASTY WITH STENT PLACEMENT Left 05/02/2007   PCI with stenting of the RCA (unknown type)   ESOPHAGOGASTRODUODENOSCOPY (EGD) WITH PROPOFOL N/A 10/15/2014   Procedure: ESOPHAGOGASTRODUODENOSCOPY (EGD) WITH PROPOFOL;  Surgeon: [name], MD;  Location: [place];  Service: Endoscopy;  Laterality: N/A;   INSERTION OF ANTERIOR SEGMENT AQUEOUS DRAINAGE DEVICE (ISTENT) Left 02/02/2019   Procedure: INSERTION OF ANTERIOR SEGMENT AQUEOUS DRAINAGE DEVICE (ISTENT);  Surgeon: [name], MD;  Location: [place];  Service: Ophthalmology;  Laterality: Left;   LEFT HEART CATH AND CORONARY ANGIOGRAPHY Left 02/02/2009   Procedure: LEFT HEART CATH AND CORONARY ANGIOGRAPHY; Location: [place]; Surgeon: Katrine Coho, MD   SAVORY DILATION N/A 10/15/2014  Procedure: SAVORY DILATION;  Surgeon: [name], MD;  Location: [facility] ENDOSCOPY;  Service: Endoscopy;  Laterality: N/A;   No family history on file. Social History   Tobacco Use   Smoking status: Former    Types: Cigarettes    Quit date: 1975    Years since quitting: 48.2   Smokeless tobacco: Never  Vaping Use   Vaping Use: Never used  Substance Use Topics   Alcohol use: No   Drug use: Not Currently    Types: Marijuana    Comment: none since 2019    Pertinent Clinical Results:  LABS: Labs reviewed: Acceptable for surgery.  Lab Results  Component Value Date   WBC 4.4 05/18/2021   HGB 13.4 05/18/2021   HCT 38.3 (L) 05/18/2021   MCV 95.0 05/18/2021   PLT 157 05/18/2021   Lab Results   Component Value Date   NA 135 05/18/2021   K 3.7 05/18/2021   CO2 28 05/18/2021   GLUCOSE 153 (H) 05/18/2021   BUN 22 05/18/2021   CREATININE 0.94 05/18/2021   CALCIUM 8.7 (L) 05/18/2021   GFRNONAA >60 05/18/2021    ECG: Date: 12/22/2020 Time ECG obtained: 1537 PM Rate: 79 bpm Rhythm: atrial fibrillation; IVCD Axis (leads I and aVF): Normal Intervals: QRS 122 ms. QTc 477 ms. ST segment and T wave changes: No evidence of acute ST segment elevation or depression Comparison: Previous tracing from 06/26/2020 showed sinus bradycardia at a rate of 51 bpm.   IMAGING / PROCEDURES: CT ANGIO ABDOMEN/PELVIS WITH AND/OR WITHOUT CONTRAST performed on 05/17/2021 Stable appearance of the abdominal aorta with severe atherosclerotic disease and chronic focal dissection.  Abdominal aortic aneurysm is 3.2 cm and stable in size.  Recommend follow-up ultrasound every 3 years. Aortic atherosclerosis Stable focal bulge in the RIGHT common iliac artery which could be related to a penetrating ulcer.  This has minimally changed since 2017. Aneurysm involving the LEFT internal iliac artery measuring 1.7 cm; measured 1.5 cm in 2017 No acute abnormality in the abdomen or pelvis.  TRANSTHORACIC ECHOCARDIOGRAM performed on 09/26/2020 Moderate left ventricular systolic dysfunction with mild LVH LVEF 30% Mild left atrial enlargement Mild MR, TR, PR No AR No pericardial effusion  MYOCARDIAL PERFUSION IMAGING STUDY (LEXISCAN) performed on 01/04/2014 LVEF 45-50% Hypokinesis of the borderline inferior posterior wall No artifact Left ventricular cavity enlarged No evidence of stress-induced myocardial ischemia or arrhythmia  Impression and Plan:  Scott Cortez has been referred for pre-anesthesia review and clearance prior to him undergoing the planned anesthetic and procedural courses. Available labs, pertinent testing, and imaging results were personally reviewed by me. This patient has been  appropriately cleared by cardiology with an overall ACCEPTABLE risk of significant perioperative cardiovascular complications.  Based on clinical review performed today (07/21/21), barring any significant acute changes in the patient's overall condition, it is anticipated that he will be able to proceed with the planned surgical intervention. Any acute changes in clinical condition may necessitate his procedure being postponed and/or cancelled. Patient will meet with anesthesia team (MD and/or CRNA) on the day of his procedure for preoperative evaluation/assessment. Questions regarding anesthetic course will be fielded at that time.   Pre-surgical instructions were reviewed with the patient during his PAT appointment and questions were fielded by PAT clinical staff. Patient was advised that if any questions or concerns arise prior to his procedure then he should return a call to PAT and/or his surgeon's office to discuss.  Honor Loh, MSN, APRN, FNP-C, [place]  [place]  Peri-operative Services Nurse Practitioner Phone: [phone] Fax: [phone] 07/21/21 8:36 AM  NOTE: This note has been prepared using Dragon dictation software. Despite my best ability to proofread, there is always the potential that unintentional transcriptional errors may still occur from this process.

## 2021-07-21 NOTE — H&P (View-Only) (Signed)
?Perioperative Services ? ?Pre-Admission/Anesthesia Testing Clinical Review ? ?Date: 07/21/21 ? ?Patient Demographics:  ?Name: Scott Cortez ?DOB:   [birth year] ?MRN:   [id number] ? ?Planned Surgical Procedure(s):  ? ? Case: [id number] Date/Time: 07/27/21 1103  ? Procedure: XI ROBOTIC ASSISTED INGUINAL HERNIA (Right: Groin)  ? Anesthesia type: General  ? Pre-op diagnosis: Non-recurrent unilateral inguinal hernia without obstruction or gangrene K40.90  ? Location: [facility] OR ROOM 06 / [facility] ORS FOR ANESTHESIA GROUP  ? Surgeons: Benjamine Sprague, DO  ? ?NOTE: Available PAT nursing documentation and vital signs have been reviewed. Clinical nursing staff has updated patient's PMH/PSHx, current medication list, and drug allergies/intolerances to ensure comprehensive history available to assist in medical decision making as it pertains to the aforementioned surgical procedure and anticipated anesthetic course. Extensive review of available clinical information performed. Scott Cortez PMH and PSHx updated with any diagnoses/procedures that  may have been inadvertently omitted during his intake with the pre-admission testing department's nursing staff. ? ?Clinical Discussion:  ?Scott Cortez is a 88 y.o. male who is submitted for pre-surgical anesthesia review and clearance prior to him undergoing the above procedure. Patient is a Former Smoker (quit 05/1973). Pertinent PMH includes: CAD, MI x 2, cardiomyopathy, atrial fibrillation, bradycardia, cardiac murmur, CHF, AAA, LEFT iliac artery aneurysm, HTN, HLD, GERD (no daily Tx), OA Parkinson's disease, peripheral neuropathy, BPH, RLS, frequent falls, depression. ? ?Patient is followed by cardiology Clayborn Bigness, MD). He was last seen in the cardiology clinic on 07/10/2021; notes reviewed.  At the time of his clinic visit, patient doing well overall from a cardiovascular perspective.  He denied any episodes of chest pain, shortness of breath, PND, orthopnea, palpitations, significant  peripheral edema, vertiginous symptoms, or presyncope/syncope.  Patient with a past medical history significant for cardiovascular diagnoses.  Of note, complete records regarding patient's past cardiovascular history unavailable for review at time of consult.  Information obtained from patient and notes from local primary cardiologist. ? ?Patient suffered an inferior wall MI on 11/19/2003.  Diagnostic left heart catheterization was performed on 11/22/2003 revealing a reduced LVEF of 49%.  There was multivessel CAD; 20% proximal RCA, 90% mid RCA, 20% LM, 20% proximal LCx, 20% mid LAD, and 20% D1.  Coronary anatomy not amenable to PCI at [facility], thus patient transferred to [facility] for further evaluation and treatment.  While at [facility], patient underwent PCI and placement of a stent (unknown type) to his RCA. ? ?Patient suffered a inferoposterolateral MI on 05/02/2007.  Patient was treated with PCI and stenting (unknown type) of the RCA. ? ?Last cardiac catheterization was performed on 02/02/2009 revealing a 50% lesion to the RPLS.  Further intervention was deferred opting for medical management. ? ?Myocardial perfusion imaging study performed on 01/04/2014 revealing a reduced LVEF of 45-50%.  There was hypokinesis of the inferior posterior wall.  Left ventricular cavity noted to be enlarged.  There was no evidence of stress-induced myocardial ischemia or arrhythmia. Medical management recommended. ? ?CT imaging performed on 12/31/2015 revealed an incidental finding of a 3.1 cm AAA.  Surveillance of aneurysmal defect has been performed via CT.  Most recent CTA was performed on 05/17/2021 revealing overall stability of the noted AAA; measured 3.2 cm. Surveillance to continue regularly intervals. ? ?Most recent TTE was performed on 09/26/2020 revealing a significantly reduced left ventricular systolic function with an EF of 30%.  There was mild LVH and left atrial enlargement.  There was mild mitral, tricuspid, and pulmonary  valve regurgitation.  There was no evidence  of a significant transvalvular gradient to suggest stenosis. ? ?Patient with an atrial fibrillation diagnosis; CHA2DS2-VASc Score = 5 (age x 2, CHF, HTN, previous MI/aortic plaque).  Rate and rhythm maintained on oral carvedilol.  Patient is chronically anticoagulated using daily apixaban; compliant with therapy with no evidence or reports of GI bleeding.  Blood pressure well controlled at 124/76 on beta-blocker, diuretic, and ARB therapies.  Patient is on a statin for his HLD.  He is not diabetic.  Functional capacity limited by age-related debility, frequent falls, Parkinson's disease, and other various medical comorbidities.  Patient questionably able to achieve 4 METS of activity without angina/anginal equivalent symptoms due to the aforementioned.  No changes were made to patient's medication regimen.  Patient follow-up with outpatient cardiology in 4 months or sooner if needed. ? ?Scott Cortez is scheduled for an elective ROBOTIC ASSISTED RIGHT INGUINAL HERNIA REPAIR on 07/27/2021 with Dr. Benjamine Sprague, MD. Given patient's past medical history significant for cardiovascular diagnoses, presurgical cardiac clearance was sought by the performing surgeon's office and PAT team. Per cardiology, "this patient is optimized for surgery and may proceed with the planned procedural course with an ACCEPTABLE risk of significant perioperative cardiovascular complications".  Again, this patient is on daily anticoagulation therapy.  He has been instructed on recommendations from his cardiologist for holding his apixaban for 3 days prior to his procedure with plans to restart as soon as postoperative bleeding risk felt to be minimized by his primary attending surgeon.  The patient is aware that his last dose of apixaban should be on 07/23/2021.   ? ?Patient denies previous perioperative complications with anesthesia in the past. In review of the available records, it is noted that  patient underwent a MAC anesthetic course at [facility] (ASA III) in 02/2019 without documented complications.  ? ?Vitals with BMI 07/19/2021 05/18/2021 05/18/2021  ?Height _0  - -  ?Weight 155 lbs - -  ?BMI 23.57 - -  ?Systolic - 536 644  ?Diastolic - 76 65  ?Pulse - 58 72  ? ? ?Providers/Specialists:  ? ?NOTE: Primary physician provider listed below. Patient may have been seen by APP or partner within same practice.  ? ?PROVIDER ROLE / SPECIALTY LAST OV  ?Benjamine Sprague, DO General Surgery (Surgeon) 07/12/2021  ?Center, [facility] Primary Care Provider ???  ?Katrine Coho, MD Cardiology 07/10/2021  ?Jennings Books, MD Neurology 05/31/2021  ? ?Allergies:  ?Pneumococcal vaccines ? ?Current Home Medications:  ? ?No current facility-administered medications for this encounter.  ? ? acetaminophen (TYLENOL) 500 MG tablet  ? apixaban (ELIQUIS) 5 MG TABS tablet  ? atorvastatin (LIPITOR) 20 MG tablet  ? carbidopa-levodopa (SINEMET CR) 50-200 MG tablet  ? carbidopa-levodopa (SINEMET IR) 25-100 MG tablet  ? carvedilol (COREG) 12.5 MG tablet  ? furosemide (LASIX) 20 MG tablet  ? losartan (COZAAR) 25 MG tablet  ? polyethylene glycol (MIRALAX / GLYCOLAX) 17 g packet  ? rOPINIRole (REQUIP) 0.25 MG tablet  ? tamsulosin (FLOMAX) 0.4 MG CAPS capsule  ? lidocaine (LIDODERM) 5 %  ? ?History:  ? ?Past Medical History:  ?Diagnosis Date  ? AAA (abdominal aortic aneurysm)   ? a.) CTA 12/31/2015: measured 3.1 cm. b.) CTA 03/07/2019: measured 3.5 cm. c.) CT abdomen 05/05/2021: measured  2.8 cm. d.) CTA 05/17/2021: measured 3.2 cm  ? Acute MI, inferior wall ([place]) 11/19/2003  ? a.) LHC 11/22/2003: EF 49%; 20% pRCA, 90% mRCA, 20% LM, 20% pLCx, 20% mLAD, 20% D1; unable to perform PCI -->  transferred to [facility] and stent (unknown type) was placed to the RCA.  ? Aortic atherosclerosis ([place])   ? Atrial fibrillation ([place])   ? a.) CHA2DS2-VASc = 5 (age x 2, CHF, HTN, previous MI/aortic plaque). b.) rate/hythm maintained on oral  carvedilol; chronically anticoagulated using apixaban.  ? Bladder neck obstruction   ? BPH (benign prostatic hyperplasia)   ? Bradycardia   ? Cardiac murmur   ? Cardiomyopathy ([place])   ? CHF (congestive heart failu

## 2021-07-26 MED ORDER — LACTATED RINGERS IV SOLN: INTRAVENOUS | Status: DC

## 2021-07-26 MED ORDER — FAMOTIDINE 20 MG PO TABS
20.0000 mg | ORAL_TABLET | Freq: Once | ORAL | Status: AC
Start: 2021-07-26 — End: 2021-07-27

## 2021-07-26 MED ORDER — CEFAZOLIN SODIUM-DEXTROSE 2-4 GM/100ML-% IV SOLN
2.0000 g | INTRAVENOUS | Status: AC
  Administered 2021-07-27: 2 g via INTRAVENOUS

## 2021-07-26 MED ORDER — CHLORHEXIDINE GLUCONATE 0.12 % MT SOLN: 15.0000 mL | Freq: Once | OROMUCOSAL | Status: AC

## 2021-07-26 MED ORDER — CHLORHEXIDINE GLUCONATE CLOTH 2 % EX PADS: 6.0000 | MEDICATED_PAD | Freq: Once | CUTANEOUS | Status: DC

## 2021-07-26 MED ORDER — ORAL CARE MOUTH RINSE: 15.0000 mL | Freq: Once | OROMUCOSAL | Status: AC

## 2021-07-27 ENCOUNTER — Ambulatory Visit
Admission: RE | Admit: 2021-07-27 | Discharge: 2021-07-27 | Disposition: A | Discharge status: Home / Self Care | Payer: Medicare Other | Attending: Surgery | Admitting: Surgery

## 2021-07-27 ENCOUNTER — Encounter
Admission: RE | Disposition: A | Discharge status: Home / Self Care | Payer: Self-pay | Source: Home / Self Care | Attending: Surgery

## 2021-07-27 ENCOUNTER — Other Ambulatory Visit: Payer: Self-pay

## 2021-07-27 ENCOUNTER — Ambulatory Visit: Payer: Medicare Other

## 2021-07-27 ENCOUNTER — Encounter: Payer: Self-pay | Admitting: Surgery

## 2021-07-27 ENCOUNTER — Ambulatory Visit: Payer: Medicare Other | Admitting: Urgent Care

## 2021-07-27 DIAGNOSIS — K219 Gastro-esophageal reflux disease without esophagitis: Secondary | ICD-10-CM | POA: Diagnosis not present

## 2021-07-27 DIAGNOSIS — I2510 Atherosclerotic heart disease of native coronary artery without angina pectoris: Secondary | ICD-10-CM | POA: Diagnosis not present

## 2021-07-27 DIAGNOSIS — I252 Old myocardial infarction: Secondary | ICD-10-CM | POA: Diagnosis not present

## 2021-07-27 DIAGNOSIS — I509 Heart failure, unspecified: Secondary | ICD-10-CM | POA: Insufficient documentation

## 2021-07-27 DIAGNOSIS — I4891 Unspecified atrial fibrillation: Secondary | ICD-10-CM | POA: Diagnosis not present

## 2021-07-27 DIAGNOSIS — I110 Hypertensive heart disease with heart failure: Secondary | ICD-10-CM | POA: Insufficient documentation

## 2021-07-27 DIAGNOSIS — Z7901 Long term (current) use of anticoagulants: Secondary | ICD-10-CM | POA: Diagnosis not present

## 2021-07-27 DIAGNOSIS — Z79899 Other long term (current) drug therapy: Secondary | ICD-10-CM | POA: Insufficient documentation

## 2021-07-27 DIAGNOSIS — Z955 Presence of coronary angioplasty implant and graft: Secondary | ICD-10-CM | POA: Diagnosis not present

## 2021-07-27 DIAGNOSIS — G20 Parkinson's disease: Secondary | ICD-10-CM | POA: Diagnosis not present

## 2021-07-27 DIAGNOSIS — K4090 Unilateral inguinal hernia, without obstruction or gangrene, not specified as recurrent: Secondary | ICD-10-CM | POA: Insufficient documentation

## 2021-07-27 DIAGNOSIS — F1290 Cannabis use, unspecified, uncomplicated: Secondary | ICD-10-CM | POA: Insufficient documentation

## 2021-07-27 DIAGNOSIS — F32A Depression, unspecified: Secondary | ICD-10-CM | POA: Diagnosis not present

## 2021-07-27 DIAGNOSIS — E785 Hyperlipidemia, unspecified: Secondary | ICD-10-CM | POA: Insufficient documentation

## 2021-07-27 DIAGNOSIS — Z87891 Personal history of nicotine dependence: Secondary | ICD-10-CM | POA: Diagnosis not present

## 2021-07-27 HISTORY — DX: Abdominal aortic aneurysm, without rupture, unspecified: I71.40

## 2021-07-27 HISTORY — DX: Cardiac murmur, unspecified: R01.1

## 2021-07-27 HISTORY — DX: Benign prostatic hyperplasia without lower urinary tract symptoms: N40.0

## 2021-07-27 HISTORY — DX: Depression, unspecified: F32.A

## 2021-07-27 HISTORY — DX: Unspecified mononeuropathy of bilateral lower limbs: G57.93

## 2021-07-27 HISTORY — DX: Restless legs syndrome: G25.81

## 2021-07-27 HISTORY — DX: Repeated falls: R29.6

## 2021-07-27 HISTORY — DX: Long term (current) use of anticoagulants: Z79.01

## 2021-07-27 HISTORY — PX: XI ROBOTIC ASSISTED INGUINAL HERNIA REPAIR WITH MESH: SHX6706

## 2021-07-27 HISTORY — DX: Atherosclerosis of aorta: I70.0

## 2021-07-27 HISTORY — DX: Aneurysm of iliac artery: I72.3

## 2021-07-27 HISTORY — DX: Unspecified atrial fibrillation: I48.91

## 2021-07-27 HISTORY — DX: Cardiomyopathy, unspecified: I42.9

## 2021-07-27 HISTORY — DX: Unspecified osteoarthritis, unspecified site: M19.90

## 2021-07-27 SURGERY — REPAIR, HERNIA, INGUINAL, ROBOT-ASSISTED, LAPAROSCOPIC, USING MESH
Anesthesia: General

## 2021-07-27 MED ORDER — DEXAMETHASONE SODIUM PHOSPHATE 10 MG/ML IJ SOLN
INTRAMUSCULAR | Status: AC
  Filled 2021-07-27: qty 1

## 2021-07-27 MED ORDER — OXYCODONE-ACETAMINOPHEN 5-325 MG PO TABS: 1.0000 | ORAL_TABLET | Freq: Three times a day (TID) | ORAL | 0 refills | Status: DC | PRN

## 2021-07-27 MED ORDER — IPRATROPIUM-ALBUTEROL 0.5-2.5 (3) MG/3ML IN SOLN
RESPIRATORY_TRACT | Status: AC
  Filled 2021-07-27: qty 3

## 2021-07-27 MED ORDER — DOCUSATE SODIUM 100 MG PO CAPS: 100.0000 mg | ORAL_CAPSULE | Freq: Two times a day (BID) | ORAL | 0 refills | Status: AC | PRN

## 2021-07-27 MED ORDER — PHENYLEPHRINE HCL (PRESSORS) 10 MG/ML IV SOLN
INTRAVENOUS | Status: DC | PRN
  Administered 2021-07-27: 80 ug via INTRAVENOUS

## 2021-07-27 MED ORDER — BUPIVACAINE LIPOSOME 1.3 % IJ SUSP
INTRAMUSCULAR | Status: AC
  Filled 2021-07-27: qty 20

## 2021-07-27 MED ORDER — CHLORHEXIDINE GLUCONATE 0.12 % MT SOLN
OROMUCOSAL | Status: AC
  Administered 2021-07-27: 15 mL via OROMUCOSAL
  Filled 2021-07-27: qty 15

## 2021-07-27 MED ORDER — IPRATROPIUM-ALBUTEROL 0.5-2.5 (3) MG/3ML IN SOLN
3.0000 mL | Freq: Once | RESPIRATORY_TRACT | Status: AC
Start: 2021-07-27 — End: 2021-07-27
  Administered 2021-07-27: 3 mL via RESPIRATORY_TRACT

## 2021-07-27 MED ORDER — FENTANYL CITRATE (PF) 100 MCG/2ML IJ SOLN
INTRAMUSCULAR | Status: DC | PRN
Start: 2021-07-27 — End: 2021-07-27
  Administered 2021-07-27 (×4): 25 ug via INTRAVENOUS

## 2021-07-27 MED ORDER — CEFAZOLIN SODIUM-DEXTROSE 2-4 GM/100ML-% IV SOLN
INTRAVENOUS | Status: AC
  Filled 2021-07-27: qty 100

## 2021-07-27 MED ORDER — FAMOTIDINE 20 MG PO TABS
ORAL_TABLET | ORAL | Status: AC
  Administered 2021-07-27: 20 mg via ORAL
  Filled 2021-07-27: qty 1

## 2021-07-27 MED ORDER — LIDOCAINE HCL (PF) 2 % IJ SOLN
INTRAMUSCULAR | Status: AC
  Filled 2021-07-27: qty 5

## 2021-07-27 MED ORDER — DEXAMETHASONE SODIUM PHOSPHATE 10 MG/ML IJ SOLN
INTRAMUSCULAR | Status: DC | PRN
  Administered 2021-07-27: 5 mg via INTRAVENOUS

## 2021-07-27 MED ORDER — ACETAMINOPHEN 325 MG PO TABS: 650.0000 mg | ORAL_TABLET | Freq: Three times a day (TID) | ORAL | 0 refills | Status: AC | PRN

## 2021-07-27 MED ORDER — FENTANYL CITRATE (PF) 100 MCG/2ML IJ SOLN
INTRAMUSCULAR | Status: AC
  Filled 2021-07-27: qty 2

## 2021-07-27 MED ORDER — PROPOFOL 10 MG/ML IV BOLUS
INTRAVENOUS | Status: DC | PRN
  Administered 2021-07-27: 70 mg via INTRAVENOUS

## 2021-07-27 MED ORDER — ONDANSETRON HCL 4 MG/2ML IJ SOLN
INTRAMUSCULAR | Status: AC
  Filled 2021-07-27: qty 2

## 2021-07-27 MED ORDER — SUGAMMADEX SODIUM 500 MG/5ML IV SOLN
INTRAVENOUS | Status: DC | PRN
Start: 2021-07-27 — End: 2021-07-27
  Administered 2021-07-27: 200 mg via INTRAVENOUS

## 2021-07-27 MED ORDER — LIDOCAINE HCL (CARDIAC) PF 100 MG/5ML IV SOSY
PREFILLED_SYRINGE | INTRAVENOUS | Status: DC | PRN
  Administered 2021-07-27: 100 mg via INTRAVENOUS

## 2021-07-27 MED ORDER — BUPIVACAINE-EPINEPHRINE 0.5% -1:200000 IJ SOLN
INTRAMUSCULAR | Status: DC | PRN
  Administered 2021-07-27: 16 mL

## 2021-07-27 MED ORDER — BUPIVACAINE LIPOSOME 1.3 % IJ SUSP
INTRAMUSCULAR | Status: DC | PRN
  Administered 2021-07-27: 20 mL

## 2021-07-27 MED ORDER — IPRATROPIUM-ALBUTEROL 0.5-2.5 (3) MG/3ML IN SOLN: 3.0000 mL | RESPIRATORY_TRACT | Status: DC

## 2021-07-27 MED ORDER — ONDANSETRON HCL 4 MG/2ML IJ SOLN
INTRAMUSCULAR | Status: DC | PRN
  Administered 2021-07-27: 4 mg via INTRAVENOUS

## 2021-07-27 MED ORDER — BUPIVACAINE-EPINEPHRINE (PF) 0.5% -1:200000 IJ SOLN
INTRAMUSCULAR | Status: AC
  Filled 2021-07-27: qty 30

## 2021-07-27 MED ORDER — ROCURONIUM BROMIDE 10 MG/ML (PF) SYRINGE
PREFILLED_SYRINGE | INTRAVENOUS | Status: AC
  Filled 2021-07-27: qty 10

## 2021-07-27 MED ORDER — ROCURONIUM BROMIDE 100 MG/10ML IV SOLN
INTRAVENOUS | Status: DC | PRN
Start: 2021-07-27 — End: 2021-07-27
  Administered 2021-07-27: 10 mg via INTRAVENOUS
  Administered 2021-07-27: 50 mg via INTRAVENOUS

## 2021-07-27 MED ORDER — PROPOFOL 10 MG/ML IV BOLUS
INTRAVENOUS | Status: AC
  Filled 2021-07-27: qty 20

## 2021-07-27 SURGICAL SUPPLY — 49 items
ADH SKN CLS APL DERMABOND .7 (GAUZE/BANDAGES/DRESSINGS) ×2
BAG INFUSER PRESSURE 100CC (MISCELLANEOUS) IMPLANT
BLADE SURG SZ11 CARB STEEL (BLADE) ×3 IMPLANT
BNDG GAUZE ELAST 4 BULKY (GAUZE/BANDAGES/DRESSINGS) ×3 IMPLANT
COVER TIP SHEARS 8 DVNC (MISCELLANEOUS) ×2 IMPLANT
COVER TIP SHEARS 8MM DA VINCI (MISCELLANEOUS) ×1
DERMABOND ADVANCED (GAUZE/BANDAGES/DRESSINGS) ×1
DERMABOND ADVANCED .7 DNX12 (GAUZE/BANDAGES/DRESSINGS) ×2 IMPLANT
DRAPE ARM DVNC X/XI (DISPOSABLE) ×6 IMPLANT
DRAPE COLUMN DVNC XI (DISPOSABLE) ×2 IMPLANT
DRAPE DA VINCI XI ARM (DISPOSABLE) ×3
DRAPE DA VINCI XI COLUMN (DISPOSABLE) ×1
ELECT CAUTERY BLADE 6.4 (BLADE) IMPLANT
ELECT REM PT RETURN 9FT ADLT (ELECTROSURGICAL) ×3
ELECTRODE REM PT RTRN 9FT ADLT (ELECTROSURGICAL) ×2 IMPLANT
GLOVE SURG SYN 6.5 ES PF (GLOVE) ×15 IMPLANT
GLOVE SURG SYN 6.5 PF PI (GLOVE) ×2 IMPLANT
GLOVE SURG UNDER POLY LF SZ7 (GLOVE) ×12 IMPLANT
GOWN STRL REUS W/ TWL LRG LVL3 (GOWN DISPOSABLE) ×8 IMPLANT
GOWN STRL REUS W/TWL LRG LVL3 (GOWN DISPOSABLE) ×15
IRRIGATOR SUCT 8 DISP DVNC XI (IRRIGATION / IRRIGATOR) IMPLANT
IRRIGATOR SUCTION 8MM XI DISP (IRRIGATION / IRRIGATOR)
IV NS 1000ML (IV SOLUTION)
IV NS 1000ML BAXH (IV SOLUTION) IMPLANT
LABEL OR SOLS (LABEL) IMPLANT
MANIFOLD NEPTUNE II (INSTRUMENTS) ×3 IMPLANT
MESH 3DMAX 4X6 RT LRG (Mesh General) ×1 IMPLANT
MESH 3DMAX MID 4X6 RT LRG (Mesh General) ×1 IMPLANT
NDL INSUFFLATION 14GA 120MM (NEEDLE) ×1 IMPLANT
NEEDLE HYPO 22GX1.5 SAFETY (NEEDLE) ×3 IMPLANT
NEEDLE INSUFFLATION 14GA 120MM (NEEDLE) ×3 IMPLANT
OBTURATOR OPTICAL STANDARD 8MM (TROCAR) ×1
OBTURATOR OPTICAL STND 8 DVNC (TROCAR) ×2
OBTURATOR OPTICALSTD 8 DVNC (TROCAR) ×2 IMPLANT
PACK LAP CHOLECYSTECTOMY (MISCELLANEOUS) ×3 IMPLANT
PENCIL ELECTRO HAND CTR (MISCELLANEOUS) ×3 IMPLANT
SEAL CANN UNIV 5-8 DVNC XI (MISCELLANEOUS) ×6 IMPLANT
SEAL XI 5MM-8MM UNIVERSAL (MISCELLANEOUS) ×3
SET TUBE SMOKE EVAC HIGH FLOW (TUBING) ×3 IMPLANT
SOLUTION ELECTROLUBE (MISCELLANEOUS) ×3 IMPLANT
SUT MNCRL 4-0 (SUTURE) ×3
SUT MNCRL 4-0 27XMFL (SUTURE) ×2
SUT VIC AB 2-0 SH 27 (SUTURE) ×3
SUT VIC AB 2-0 SH 27XBRD (SUTURE) ×2 IMPLANT
SUT VLOC 90 6 CV-15 VIOLET (SUTURE) ×6 IMPLANT
SUTURE MNCRL 4-0 27XMF (SUTURE) ×2 IMPLANT
SYR 30ML LL (SYRINGE) ×3 IMPLANT
TAPE TRANSPORE STRL 2 31045 (GAUZE/BANDAGES/DRESSINGS) ×3 IMPLANT
WATER STERILE IRR 500ML POUR (IV SOLUTION) ×1 IMPLANT

## 2021-07-27 NOTE — Anesthesia Preprocedure Evaluation (Addendum)
Anesthesia Evaluation  ?Patient identified by MRN, date of birth, ID band ?Patient awake ? ? ? ?Reviewed: ?Allergy & Precautions, NPO status , Patient's Chart, lab work & pertinent test results ? ?Airway ?Mallampati: II ? ?TM Distance: >3 FB ?Neck ROM: full ? ? ? Dental ? ?(+) Implants, Dental Advisory Given ?  ?Pulmonary ?neg pulmonary ROS, Patient abstained from smoking., former smoker,  ?  ?Pulmonary exam normal ?breath sounds clear to auscultation ? ? ? ? ? ? Cardiovascular ?hypertension, Pt. on medications ?+ CAD, + Past MI and + Cardiac Stents  ?Normal cardiovascular exam+ dysrhythmias Atrial Fibrillation  ?Rhythm:Regular Rate:Normal ? ? ?  ?Neuro/Psych ?Depression negative neurological ROS ? negative psych ROS  ? GI/Hepatic ?negative GI ROS, Neg liver ROS, GERD  ,  ?Endo/Other  ?negative endocrine ROS ? Renal/GU ?negative Renal ROS  ? ?  ?Musculoskeletal ? ?(+) Arthritis ,  ? Abdominal ?Normal abdominal exam  (+)   ?Peds ?negative pediatric ROS ?(+)  Hematology ?negative hematology ROS ?(+)   ?Anesthesia Other Findings ?Past Medical History: ?No date: AAA (abdominal aortic aneurysm) ?    Comment:  a.) CTA 12/31/2015: measured 3.1 cm. b.) CTA 03/07/2019: ?             measured 3.5 cm. c.) CT abdomen 05/05/2021: measured  2.8 ?             cm. d.) CTA 05/17/2021: measured 3.2 cm ?11/19/2003: Acute MI, inferior wall (HCC) ?    Comment:  a.) LHC 11/22/2003: EF 49%; 20% pRCA, 90% mRCA, 20% LM,  ?             20% pLCx, 20% mLAD, 20% D1; unable to perform PCI -->  ?             transferred to [facility] and stent (unknown type) was placed to ?             the RCA. ?No date: Aortic atherosclerosis (HCC) ?No date: Atrial fibrillation (HCC) ?    Comment:  a.) CHA2DS2-VASc = 5 (age x 2, CHF, HTN, previous  ?             MI/aortic plaque). b.) rate/hythm maintained on oral  ?             carvedilol; chronically anticoagulated using apixaban. 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    Comment:  a.) TTE 01/05/2014: EF 50%; mild LA enlargement; triv  ?             AR//PR, mild MR/TR; G1DD. b.) TTE 03/08/2019: EF 60-65%;  ?             RV mildly enlarged. c.) TTE 09/26/2020: EF 30%; mild LVH; ?             mild LA enlargement; mild MR/TR/PR. ?No date: Chronic pancreatitis (HCC) ?No date: Coronary artery disease ?    Comment:  a.) inferior wall MI 11/19/2003; LHC 11/22/2003: EF 49%; ?             20% pRCA, 90% mRCA, 20% LM, 20% pLCx, 20% mLAD, 20% D1;  ?             unable to perform PCI --> transferred to [facility] and stent  ?             (unknown type) was placed  to the RCA. b.) MI 2007/2008  ?             (date unknown); PCI placing a second stent (unknown type) ?             to RCA. c.) LHC 02/02/2009: 50% RPLS; intervention  ?             deferred opting for med mgmt. ?No date: Current use of long term anticoagulation ?    Comment:  a.) apixaban ?No date: Dental bridge present ?    Comment:  upper - bilateral ?No date: Depression ?No date: Frequent falls ?No date: GERD (gastroesophageal reflux disease) ?No date: Glaucoma ?No date: Hyperlipidemia ?No date: Hypertension ?No date: Iliac artery aneurysm, left (HCC) ?    Comment:  a.)  CTA 05/17/2021: measured 1.7 cm; previously 1.5 cm  ?             in 2015. ?05/02/2007: MI (myocardial infarction) (HCC) ?    Comment:  a.) inferoposterolateral MI 05/02/2007 - PCI placing a  ?             stent (unknown type) to the RCA ?No date: Neuropathy of both feet ?No date: Osteoarthritis ?No date: Parkinson's disease (HCC) ?No date: RLS (restless legs syndrome) ?    Comment:  a.) takes ropinirole ? ?Past Surgical History: ?02/02/2019: CATARACT EXTRACTION W/PHACO; Left ?    Comment:  Procedure: CATARACT EXTRACTION PHACO AND INTRAOCULAR  ?             LENS PLACEMENT (IOC) LEFT ISTENT INJ AND ISTENT 01:09.1   ?              11.3%          8.47;  Surgeon: Nevada Crane, MD;   ?             Location: [facility];  Service: Ophthalmology;   ?             Laterality: Left; ?02/23/2019: CATARACT EXTRACTION W/PHACO; Right ?    Comment:  Procedure: CATARACT EXTRACTION PHACO AND INTRAOCULAR  ?             LENS PLACEMENT (IOC) RIGHT ISTENT INJ 01:09.1  14.1%   ?             9.78;  Surgeon: Nevada Crane, MD;  Location: [facility] ?             SURGERY [facility];  Service: Ophthalmology;  Laterality:  ?             Right; ?10/15/2014: COLONOSCOPY; N/A ?    Comment:  Procedure: COLONOSCOPY;  Surgeon: Scot Jun, MD;  ?             Location: [facility];  Service: Endoscopy;   ?             Laterality: N/A; ?11/16/2003: CORONARY ANGIOPLASTY WITH STENT PLACEMENT; Left ?    Comment:  PCI and stent placement to the RCA (unknown type) ?05/02/2007: CORONARY ANGIOPLASTY WITH STENT PLACEMENT; Left ?    Comment:  PCI with stenting of the RCA (unknown type) ?10/15/2014: ESOPHAGOGASTRODUODENOSCOPY (EGD) WITH PROPOFOL; N/A ?    Comment:  Procedure: ESOPHAGOGASTRODUODENOSCOPY (EGD) WITH  ?             PROPOFOL;  Surgeon: Scot Jun, MD;  Location: [facility] ?             ENDOSCOPY;  Service: Endoscopy;  Laterality:  N/A; ?02/02/2019: INSERTION OF ANTERIOR SEGMENT AQUEOUS DRAINAGE DEVICE  ?(ISTENT); Left ?    Comment:  Procedure: INSERTION OF ANTERIOR SEGMENT AQUEOUS  ?             DRAINAGE DEVICE (ISTENT);  Surgeon: Nevada CraneKing, Bradley Mark,  ?             MD;  Location: [facility];  Service:  ?             Ophthalmology;  Laterality: Left; ?02/02/2009: LEFT HEART CATH AND CORONARY ANGIOGRAPHY; Left ?    Comment:  Procedure: LEFT HEART CATH AND CORONARY ANGIOGRAPHY;  ?             Location: [facility]; Surgeon: Rudean Hittwyane Callwood, MD ?10/15/2014: Gaspar BiddingSAVORY DILATION; N/A ?    Comment:  Procedure: SAVORY DILATION;  Surgeon: Scot Junobert T Elliott,  ?             MD;  Location: [facility];  Service: Endoscopy;   ?             Laterality:  N/A; ? ?BMI   ? Body Mass Index: 23.57 kg/m?  ?  ? ? Reproductive/Obstetrics ?negative OB ROS ? ?  ? ? ? ? ? ? ? ? ? ? ? ? ? ?  ?  ? ? ? ? ? ? ? ?Anesthesia Physical ?Anesthesia Plan ? ?ASA: 3 ? ?Anesthesia Plan: General  ? ?Post-op Pain Management:   ? ?Induction: Intravenous ? ?PONV Risk Score and Plan: Ondansetron, Dexamethasone, Midazolam and Treatment may vary due to age or medical condition ? ?Airway Management Planned: Oral ETT ? ?Additional Equipment:  ? ?Intra-op Plan:  ? ?Post-operative Plan:  ? ?Informed Consent: I have reviewed the patients History and Physical, chart, labs and discussed the procedure including the risks, benefits and alternatives for the proposed anesthesia with the patient or authorized representative who has indicated his/her understanding and acceptance.  ? ? ? ?Dental Advisory Given ? ?Plan Discussed with: CRNA and Surgeon ? ?Anesthesia Plan Comments:   ? ? ? ? ? ? ?Anesthesia Quick Evaluation ? ?

## 2021-07-27 NOTE — Anesthesia Procedure Notes (Signed)
Procedure Name: Intubation ?Date/Time: 07/27/2021 2:17 PM ?Performed by: Cammie Sickle, CRNA ?Pre-anesthesia Checklist: Patient identified, Patient being monitored, Timeout performed, Emergency Drugs available and Suction available ?Patient Re-evaluated:Patient Re-evaluated prior to induction ?Oxygen Delivery Method: Circle system utilized ?Preoxygenation: Pre-oxygenation with 100% oxygen ?Induction Type: IV induction ?Ventilation: Mask ventilation without difficulty ?Laryngoscope Size: 3 and McGraph ?Grade View: Grade I ?Tube type: Oral ?Tube size: 7.5 mm ?Number of attempts: 1 ?Airway Equipment and Method: Stylet ?Placement Confirmation: ETT inserted through vocal cords under direct vision, positive ETCO2 and breath sounds checked- equal and bilateral ?Secured at: 22 cm ?Tube secured with: Tape ?Dental Injury: Teeth and Oropharynx as per pre-operative assessment  ? ? ? ? ?

## 2021-07-27 NOTE — Interval H&P Note (Signed)
No changes. OK to proceed.

## 2021-07-27 NOTE — Op Note (Signed)
Preoperative diagnosis: right, reducible, intial inguinal Hernia. ? ?Postoperative diagnosis: right inguinal Hernia ? ?Procedure: Robotic assisted laparoscopic right inguinal hernia repair with mesh ? ?Anesthesia: General ? ?Surgeon: Dr. Tonna Boehringer ? ?Wound Classification: Clean ? ?Specimen: none ? ?Complications: None ? ?Estimated Blood Loss: 65mL ? ? ?Indications:  inguinal hernia. Repair was indicated to avoid complications of incarceration, obstruction and pain, and a prosthetic mesh repair was elected.  See H&P for further details. ? ?Findings: ?1. Vas Deferens and cord structures identified and preserved ?2. Bard 3D max medium weight mesh used for repair ?3. Adequate hemostasis achieved ? ?Description of procedure: ?The patient was taken to the operating room. A time-out was completed verifying correct patient, procedure, site, positioning, and implant(s) and/or special equipment prior to beginning this procedure.  Area was prepped and draped in the usual sterile fashion. An incision was marked 20 cm above the pubic tubercle, slightly above the umbilicus  Scrotum wrapped in Kerlix roll. ? ?Veress needle inserted at palmer's point.  Saline drop test noted to be positive with gradual increase in pressure after initiation of gas insufflation.  15 mm of pressure was achieved prior to removing the Veress needle and then placing a 8 mm port via the Optiview technique through the supraumbilical site.  Inspection of the area afterwards noted no injury to the surrounding organs during insertion of the needle and the port.  2 port sites were marked 8 cm to the lateral sides of the initial port, and a 8 mm robotic port was placed on the left side, another 8 mm robotic port on the right side under direct supervision.  Local anesthesia  infused to the preplanned incision sites prior to insertion of the port.  The [employer] platform was then brought into the operative field and docked to the ports. ? ?Examination of the  abdominal cavity noted a right inguinal hernia.  A peritoneal flap was created approximately 8cm cephalad to the defect by using scissors with electrocautery.  Dissection was carried down towards the pubic tubercle, developing the myopectineal orifice view.  Laterally the flap was carried towards the ASIS.  Moderate size scarred down hernia sac was noted, which carefully dissected away from the adjacent tissues to be fully reduced out of hernia cavity eventually.  Any bleeding was controlled with combination of electrocautery and manual pressure.   ? ?After confirming adequate dissection and the peritoneal reflection completely down and away from the cord structures, a Large Bard 3DMax medium weight mesh was placed within the anterior abdominal wall, secured in place using 2-0 Vicryl on an SH needle immediately above the pubic tubercle.  After noting proper placement of the mesh with the peritoneal reflection deep to it, the previously created peritoneal flap was secured back up to the anterior abdominal wall using running 3-0 V-Lock.  Both needles were then removed out of the abdominal cavity, Xi platform undocked from the ports and removed off of operative field.  exparel infused as ilioinguinal block. ? ?Abdomen then desufflated and ports removed. All the skin incisions were then closed with a subcuticular stitch of Monocryl 4-0. Dermabond was applied. The testis was gently pulled down into its anatomic position in the scrotum.  The patient tolerated the procedure well and was taken to the postanesthesia care unit in stable condition. Sponge and instrument count correct at end of procedure.  ?

## 2021-07-27 NOTE — Anesthesia Postprocedure Evaluation (Signed)
Anesthesia Post Note ? ?Patient: Scott Cortez ? ?Procedure(s) Performed: XI ROBOTIC [place] ? ?Patient location during evaluation: PACU ?Anesthesia Type: General ?Level of consciousness: awake and alert ?Pain management: pain level controlled ?Vital Signs Assessment: post-procedure vital signs reviewed and stable ?Respiratory status: spontaneous breathing, nonlabored ventilation, respiratory function stable and patient connected to nasal cannula oxygen ?Cardiovascular status: blood pressure returned to baseline and stable ?Postop Assessment: no apparent nausea or vomiting ?Anesthetic complications: no ?Comments: Patient initially complaining of dyspnea, with normal spo2. Patient with cardiac history, CXR ordered to evaluate for pulmonary edema. CXR relatively unremarkable, duoneb ordered. Subjective dyspnea improved. ? ? ?No notable events documented. ? ? ?Last Vitals:  ?Vitals:  ? 07/27/21 1630 07/27/21 1645  ?BP: (!) 142/86 (!) 148/90  ?Pulse: 90 87  ?Resp: 17 12  ?Temp:    ?SpO2: 95% 100%  ?  ?Last Pain:  ?Vitals:  ? 07/27/21 1630  ?TempSrc:   ?PainSc: 0-No pain  ? ? ?  ?  ?  ?  ?  ?  ? ?Arita Miss ? ? ? ? ?

## 2021-07-27 NOTE — Progress Notes (Signed)
1649 Patient c/o shortness of breath, lung sounds congested, on Room air 96%. Called Dr. Suzan Slick anesthesia- Duoneb ordered and chest xray. Sitting up in chair ?

## 2021-07-27 NOTE — Transfer of Care (Signed)
Immediate Anesthesia Transfer of Care Note ? ?Patient: Scott Cortez ? ?Procedure(s) Performed: XI ROBOTIC ASSISTED INGUINAL HERNIA REPAIR WITH MESH ? ?Patient Location: PACU ? ?Anesthesia Type:General ? ?Level of Consciousness: awake ? ?Airway & Oxygen Therapy: Patient Spontanous Breathing ? ?Post-op Assessment: Report given to RN and Post -op Vital signs reviewed and stable ? ?Post vital signs: Reviewed and stable ? ?Last Vitals:  ?Vitals Value Taken Time  ?BP 141/91 07/27/21 1603  ?Temp 35.9   ?Pulse 81 07/27/21 1609  ?Resp 22 07/27/21 1611  ?SpO2 94 % 07/27/21 1609  ?Vitals shown include unvalidated device data. ? ?Last Pain:  ?Vitals:  ? 07/27/21 0936  ?TempSrc: Oral  ?PainSc: 0-No pain  ?   ? ?  ? ?Complications: No notable events documented. ?

## 2021-07-27 NOTE — Discharge Instructions (Addendum)
Hernia repair, Care After ?This sheet gives you information about how to care for yourself after your procedure. Your health care provider may also give you more specific instructions. If you have problems or questions, contact your health care provider. ?What can I expect after the procedure? ?After your procedure, it is common to have the following: ?Pain in your abdomen, especially in the incision areas. You will be given medicine to control the pain. ?Tiredness. This is a normal part of the recovery process. Your energy level will return to normal over the next several weeks. ?Changes in your bowel movements, such as constipation or needing to go more often. Talk with your health care provider about how to manage this. ?Follow these instructions at home: ?Medicines ?Restart Eliquis in 48hrs  ?tylenol and advil as needed for discomfort.  Please alternate between the two every four hours as needed for pain.   ? Use narcotics, if prescribed, only when tylenol and motrin is not enough to control pain. ? 325-650mg  every 8hrs to max of 3000mg /24hrs (including the 325mg  in every norco dose) for the tylenol.   ? Advil up to 800mg  per dose every 8hrs as needed for pain.   ?PLEASE RECORD NUMBER OF PILLS TAKEN UNTIL NEXT FOLLOW UP APPT.  THIS WILL HELP DETERMINE HOW READY YOU ARE TO BE RELEASED FROM ANY ACTIVITY RESTRICTIONS ?Do not drive or use heavy machinery while taking prescription pain medicine. ?Do not drink alcohol while taking prescription pain medicine. ? ?Incision care ? ?  ?Follow instructions from your health care provider about how to take care of your incision areas. Make sure you: ?Keep your incisions clean and dry. ?Wash your hands with soap and water before and after applying medicine to the areas, and before and after changing your bandage (dressing). If soap and water are not available, use hand sanitizer. ?Change your dressing as told by your health care provider. ?Leave stitches (sutures), skin glue,  or adhesive strips in place. These skin closures may need to stay in place for 2 weeks or longer. If adhesive strip edges start to loosen and curl up, you may trim the loose edges. Do not remove adhesive strips completely unless your health care provider tells you to do that. ?Do not wear tight clothing over the incisions. Tight clothing may rub and irritate the incision areas, which may cause the incisions to open. ?Do not take baths, swim, or use a hot tub until your health care provider approves. OK TO SHOWER IN 24HRS.   ?Check your incision area every day for signs of infection. Check for: ?More redness, swelling, or pain. ?More fluid or blood. ?Warmth. ?Pus or a bad smell. ?Activity ?Avoid lifting anything that is heavier than 10 lb (4.5 kg) for 2 weeks or until your health care provider says it is okay. ?No pushing/pulling greater than 30lbs ?You may resume normal activities as told by your health care provider. Ask your health care provider what activities are safe for you. ?Take rest breaks during the day as needed. ?Eating and drinking ?Follow instructions from your health care provider about what you can eat after surgery. ?To prevent or treat constipation while you are taking prescription pain medicine, your health care provider may recommend that you: ?Drink enough fluid to keep your urine clear or pale yellow. ?Take over-the-counter or prescription medicines. ?Eat foods that are high in fiber, such as fresh fruits and vegetables, whole grains, and beans. ?Limit foods that are high in fat and processed sugars,  such as fried and sweet foods. ?General instructions ?Ask your health care provider when you will need an appointment to get your sutures or staples removed. ?Keep all follow-up visits as told by your health care provider. This is important. ?Contact a health care provider if: ?You have more redness, swelling, or pain around your incisions. ?You have more fluid or blood coming from the  incisions. ?Your incisions feel warm to the touch. ?You have pus or a bad smell coming from your incisions or your dressing. ?You have a fever. ?You have an incision that breaks open (edges not staying together) after sutures or staples have been removed. ?You develop a rash. ?You have chest pain or difficulty breathing. ?You have pain or swelling in your legs. ?You feel light-headed or you faint. ?Your abdomen swells (becomes distended). ?You have nausea or vomiting. ?You have blood in your stool (feces). ?This information is not intended to replace advice given to you by your health care provider. Make sure you discuss any questions you have with your health care provider. ?Document Released: 11/17/2004 Document Revised: 01/17/2018 Document Reviewed: 01/30/2016 ?Elsevier Interactive Patient Education ? 2019 [place]. ?  ?AMBULATORY SURGERY  ?DISCHARGE INSTRUCTIONS ? ? ?The drugs that you were given will stay in your system until tomorrow so for the next 24 hours you should not: ? ?Drive an automobile ?Make any legal decisions ?Drink any alcoholic beverage ? ? ?You may resume regular meals tomorrow.  Today it is better to start with liquids and gradually work up to solid foods. ? ?You may eat anything you prefer, but it is better to start with liquids, then soup and crackers, and gradually work up to solid foods. ? ? ?Please notify your doctor immediately if you have any unusual bleeding, trouble breathing, redness and pain at the surgery site, drainage, fever, or pain not relieved by medication. ? ? ? ?Additional Instructions: ? ? ? ? ? ? ? ?Please contact your physician with any problems or Same Day Surgery at [phone], Monday through Friday 6 am to 4 pm, or  at [facility] number at [phone].  ?

## 2021-07-27 NOTE — H&P (Signed)
Subjective:  ? ?CC: Non-recurrent unilateral inguinal hernia without obstruction or gangrene [K40.90] ? ?HPI: ?Scott Cortez is a 88 y.o. male who returns for evaluation of above. Symptoms were first noted several months ago. Pain is dull and constant, radiating from the right radiating to bilateral testicular pain initially but has resolved now. Associated with nothing, exacerbated by touch. Lump is reducible.  ? ?Past Medical History: has a past medical history of Arthritis, Bladder neck obstruction, Bradycardia, CAD (coronary artery disease), Chronic pancreatitis (CMS-HCC), Clotting disorder (CMS-HCC), Depression, FH: colon cancer (09/17/2014), GERD (gastroesophageal reflux disease), Glaucoma (increased eye pressure), History of gastroesophageal reflux (GERD) (09/17/2014), Hyperlipidemia, Hypertension, and Myocardial infarction (CMS-HCC) (2006). ? ?Past Surgical History:  ?Past Surgical History:  ?Procedure Laterality Date  ? COLONOSCOPY 09/08/2004  ?Diverticulosis  ? COLONOSCOPY 11/17/2009  ?[place] (Sister): CBF 11/2014  ? COLONOSCOPY 10/15/2014  ?[place] (Sister): No repeat due to age per RTE (dw)  ? EGD 10/15/2014  ?No repeat per RTE  ? CORONARY ANGIOPLASTY WITH STENT PLACEMENT  ? Coronary stenting  ?x3 stents - Dr. Clayborn Bigness  ? ?Family History: family history includes Cancer in his sister; Colon cancer (age of onset: 4) in his sister; Coronary Artery Disease (Blocked arteries around heart) in his brother and mother; Dementia in his mother; Diabetes type II in his mother; Heart disease in his father; High blood pressure (Hypertension) in his mother, sister, and another family member; Myocardial Infarction (Heart attack) in his mother. ? ?Social History: reports that he has quit smoking. He has never used smokeless tobacco. He reports current drug use. Drugs: Other-see comments and Marijuana. He reports that he does not drink alcohol. ? ?Current Medications: has a current medication list which includes the following  prescription(s): acetaminophen, atorvastatin, carbidopa-levodopa, carvedilol, eliquis, furosemide, losartan, polyethylene glycol, ropinirole, tamsulosin, and carbidopa-levodopa. ? ?Allergies:  ?Allergies as of 07/12/2021 - Reviewed 07/12/2021  ?Allergen Reaction Noted  ? Pneumococcal vaccine Swelling 04/18/2015  ? ?ROS:  ?A 15 point review of systems was performed and pertinent positives and negatives noted in HPI ? ?Objective:  ? ? ?BP 98/65  Pulse 59  Ht 174 cm (5' 8.5")  Wt 69.4 kg (153 lb)  BMI 22.93 kg/m?  ? ?Constitutional : Alert, cooperative, no distress  ?Lymphatics/Throat: Supple, no lymphadenopathy  ?Respiratory: clear to auscultation bilaterally  ?Cardiovascular: regular rate and rhythm  ?Gastrointestinal: soft, non-tender; bowel sounds normal; no masses, no organomegaly. inguinal hernia noted. moderate, reducible, no overlying skin changes and right  ?Musculoskeletal: Steady gait and movement  ?Skin: Cool and moist, no surgical scars  ?Psychiatric: Normal affect, non-agitated, not confused  ? ? ?LABS:  ?n/a  ? ?RADS: ?n/a ?Assessment:  ? ? ?Non-recurrent unilateral inguinal hernia without obstruction or gangrene [K40.90] ? ?Plan:  ? ? ?1. Non-recurrent unilateral inguinal hernia without obstruction or gangrene [K40.90]  ?Discussed the risk of surgery including recurrence, which can be up to 50% in the case of incisional or complex hernias, possible use of prosthetic materials (mesh) and the increased risk of mesh infxn if used, bleeding, chronic pain, post-op infxn, post-op SBO or ileus, and possible re-operation to address said risks. The risks of general anesthetic, if used, includes MI, CVA, sudden death or even reaction to anesthetic medications also discussed. Alternatives include continued observation. Benefits include possible symptom relief, prevention of incarceration, strangulation, enlargement in size over time, and the risk of emergency surgery in the face of strangulation.  ? ?Typical  post-op recovery time of 3-5 days with 2 weeks of activity restrictions  were also discussed. ? ?ED return precautions given for sudden increase in pain, size of hernia with accompanying fever, nausea, and/or vomiting. ? ?The patient verbalized understanding and all questions were answered to the patient's satisfaction. ? ?2. Patient has elected to proceed with surgical treatment. Procedure will be scheduled. right, robotic assisted laparoscopic. Pt understands increased risk of surgery due to age, eliquis use, and that the severe pain episodes he was experiencing months ago is NOT guaranteed to resolve with repair. Pt and significant other both verbalized understanding and patient specifically stated the focal pain over the hernia site is unbearable at this point and is "willing to die" to get it fixed. ? ?labs/images/medications/previous chart entries reviewed personally and relevant changes/updates noted above.  ?

## 2021-07-27 NOTE — Progress Notes (Signed)
Per Dr. Suzan Slick patient is okay to discharge home, incentive spirometer given and patient is feeling much better. Lung sounds are now clear upon auscultation.  ?

## 2021-07-31 ENCOUNTER — Encounter: Payer: Self-pay | Admitting: Surgery

## 2021-08-02 IMAGING — CT CT T SPINE W/O CM
3 series · 10 of 33 positions shown, 12 images · non-contrast
Comparison: Chest radiograph 02/28/2018, chest CT 01/09/2016

CLINICAL DATA: Back pain, minor trauma. Additional history
provided: Patient fell [REDACTED], reports back pain.

EXAM:
CT THORACIC SPINE WITHOUT CONTRAST
TECHNIQUE: Multidetector CT images of the thoracic were obtained using the
standard protocol without intravenous contrast.

[Series 4: t spine soft · axial · 0.29mm/px · z∈[+1021,+1157]mm · 2 of 149 slices shown, 3 images]
[im 46/149  soft-tissue]
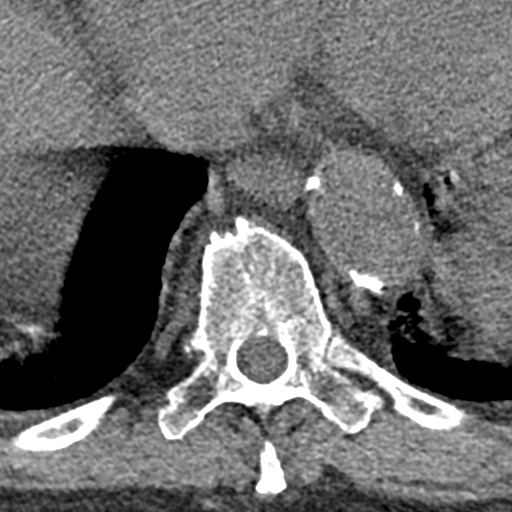
[im 46/149  bone]
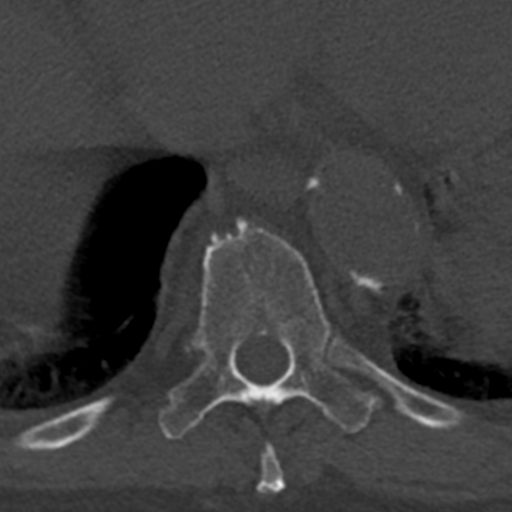
[im 114/149  bone]
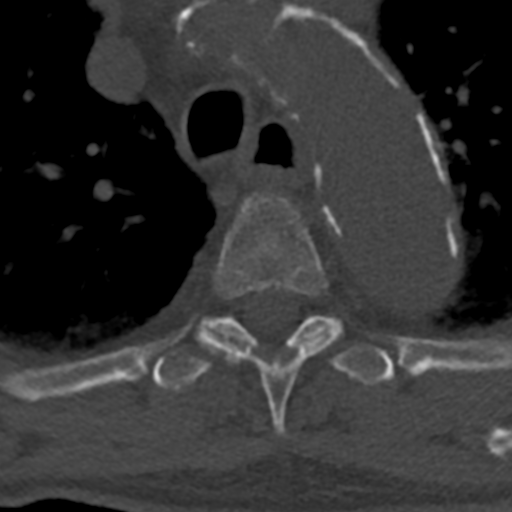

[Series 5: sagittal bone · sagittal · 0.33mm/px · 5 of 44 slices shown, 6 images]
[im 15/44  bone]
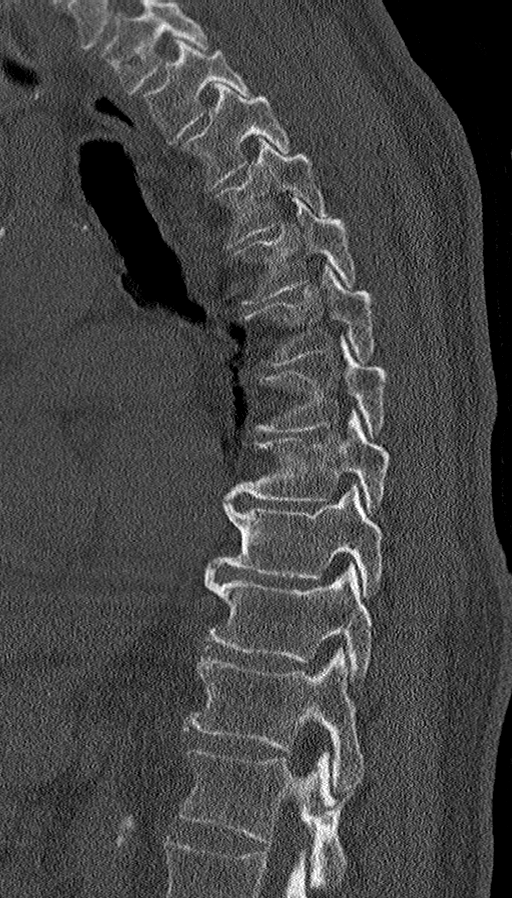
[im 18/44  bone]
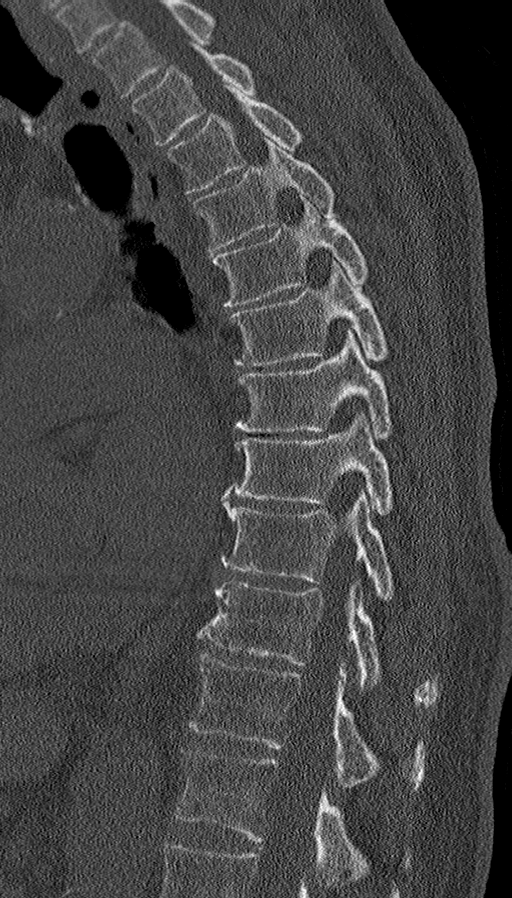
[im 22/44  soft-tissue]
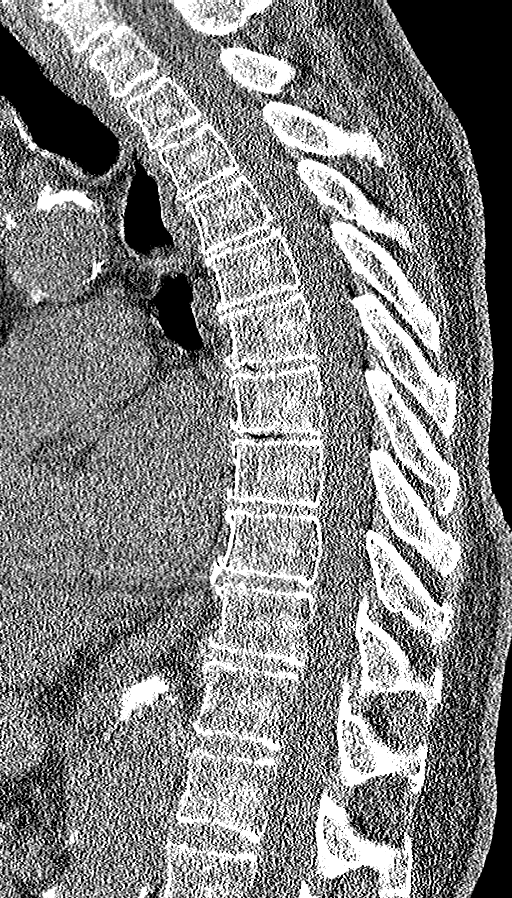
[im 22/44  bone]
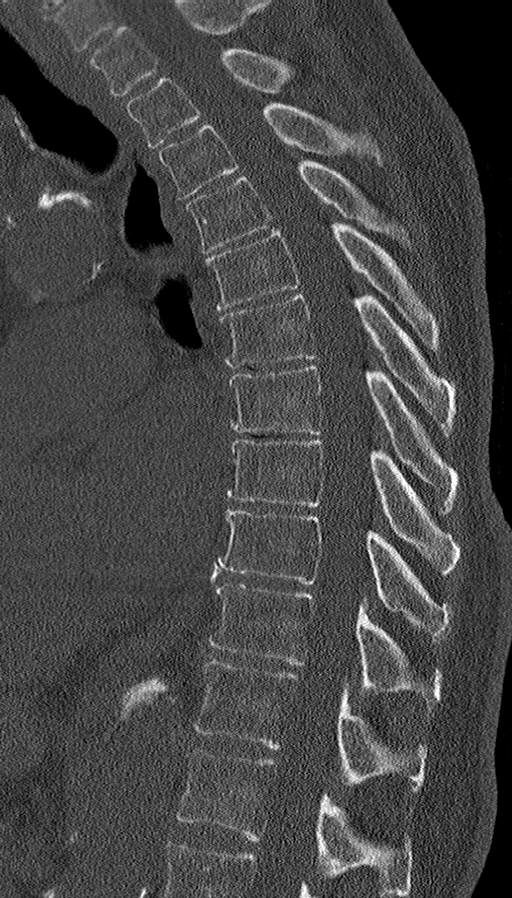
[im 26/44  bone]
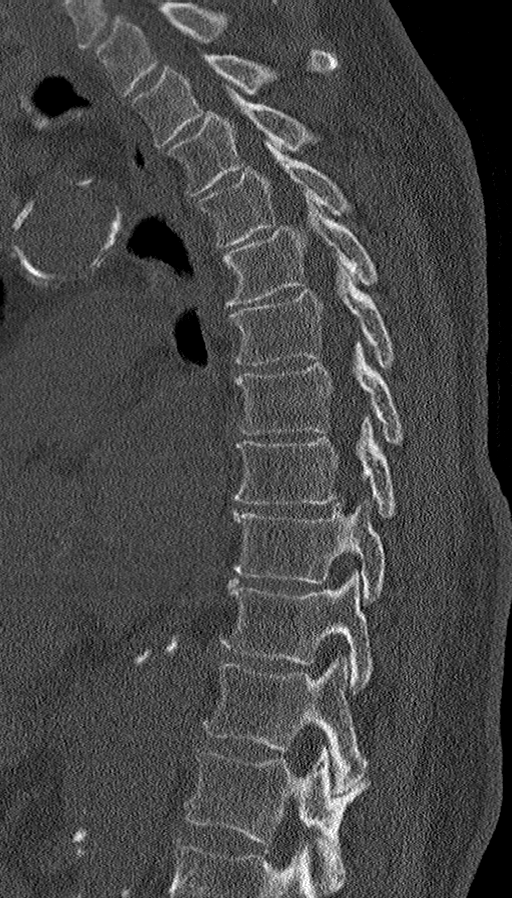
[im 29/44  bone]
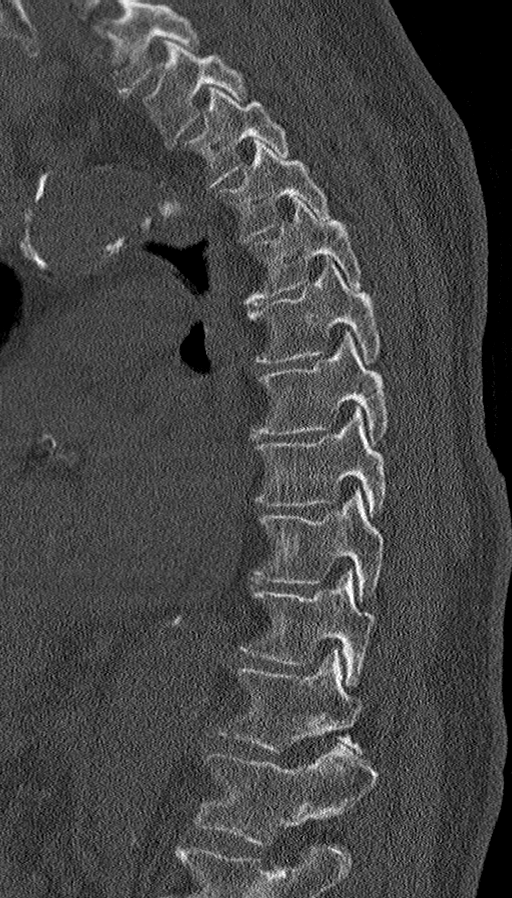

[Series 6: coronal bone · coronal · 0.26mm/px · 3 of 65 slices shown]
[im 13/65  bone]
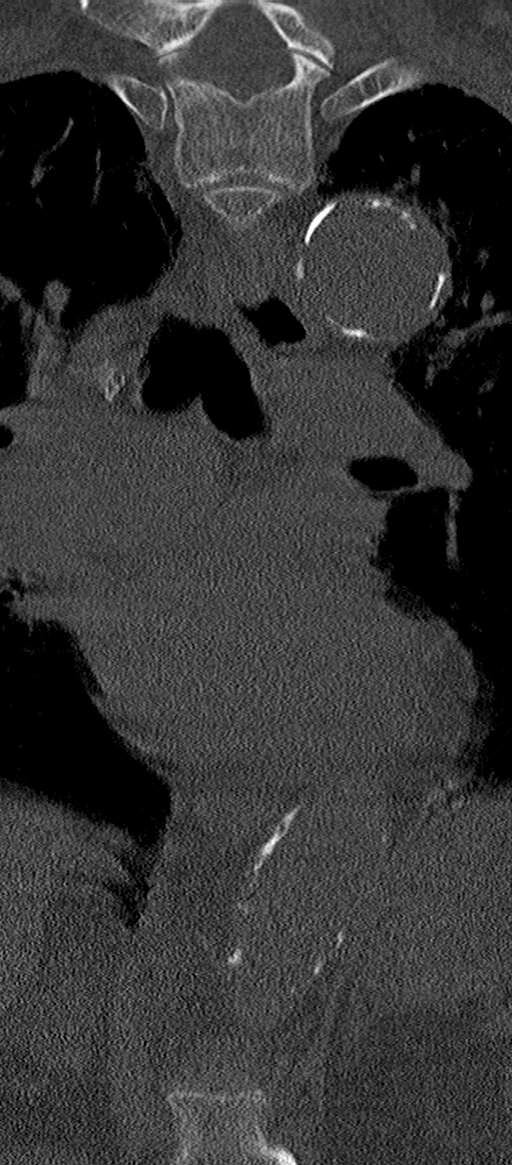
[im 26/65  bone]
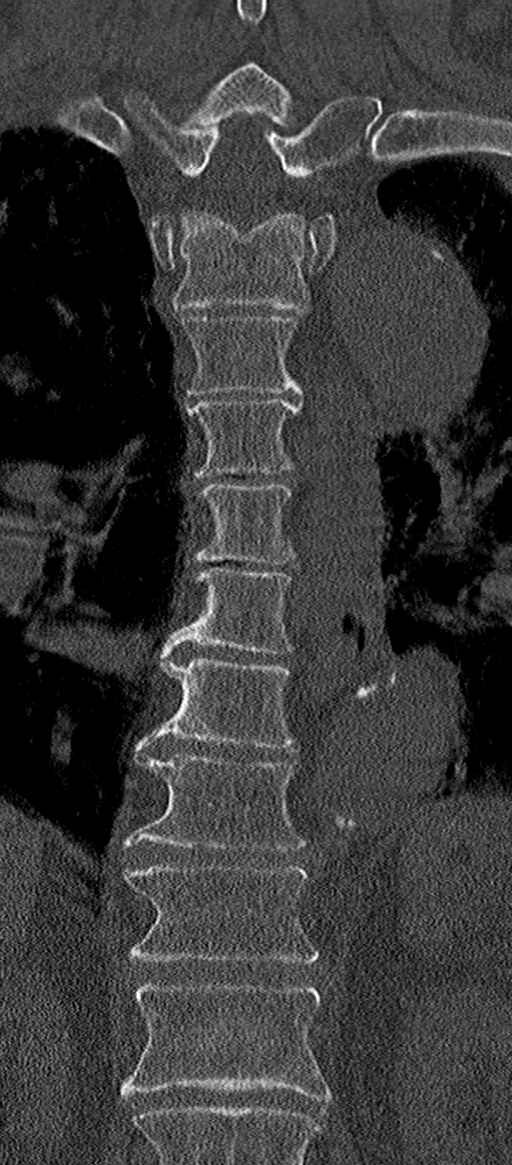
[im 39/65  bone]
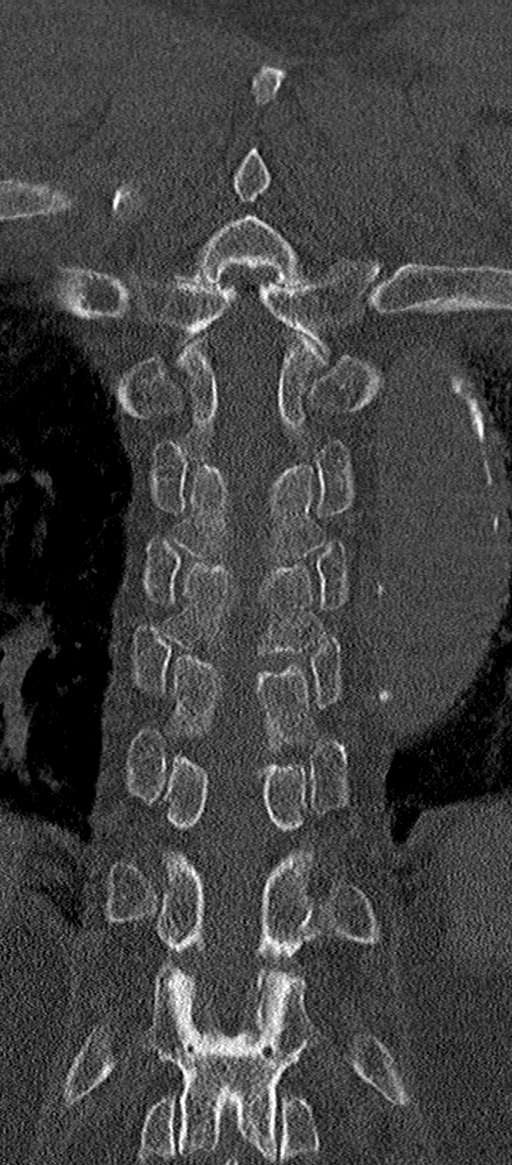

[10 of 33 positions shown; findings below may reference images not displayed]

FINDINGS: Alignment: No significant spondylolisthesis.

Vertebrae: Vertebral body height is maintained. No evidence of acute
fracture to the thoracic spine.

Paraspinal and other soft tissues: Paraspinal soft tissues within
normal limits. Aortic atherosclerosis. Paraseptal emphysema.

Disc levels:

Thoracic spondylosis with levels of mild and moderate disc
degeneration greatest within the midthoracic spine. Prominent
multilevel ventrolateral osteophytes, particularly at T8-T9 and
T9-T10. No high-grade bony spinal canal or neural foraminal
narrowing at any level.
IMPRESSION: No evidence of acute fracture to the thoracic spine.

Thoracic spondylosis. Prominent ventrolateral osteophytes at T8-T9
and T9-T10.

Aortic atherosclerosis.

Paraseptal emphysema within the partially imaged lungs.

## 2021-08-02 IMAGING — CT CT L SPINE W/O CM
3 of 4 series · 13 of 33 positions shown, 16 images · non-contrast
Comparison: March 09, 2019 MRI

CLINICAL DATA: Back trauma after fall

EXAM:
CT LUMBAR SPINE WITHOUT CONTRAST
TECHNIQUE: Multidetector CT imaging of the lumbar spine was performed without
intravenous contrast administration. Multiplanar CT image
reconstructions were also generated.

[Series 6: l spine soft · axial · 0.34mm/px · z∈[+771,+933]mm · 5 of 117 slices shown, 7 images]
[im 18/117  soft-tissue]
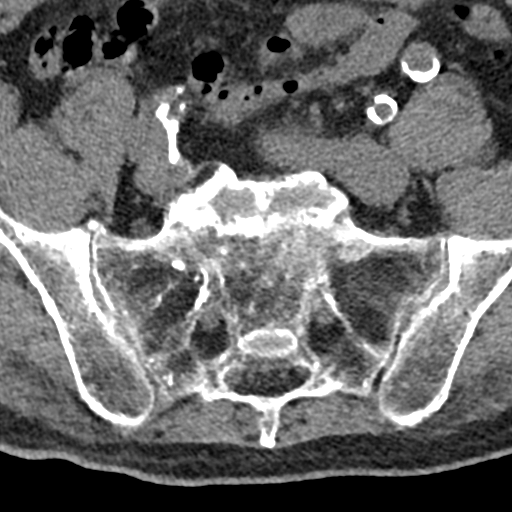
[im 18/117  bone]
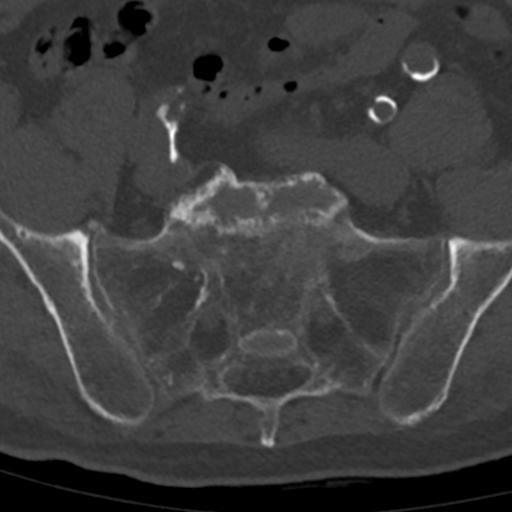
[im 36/117  bone]
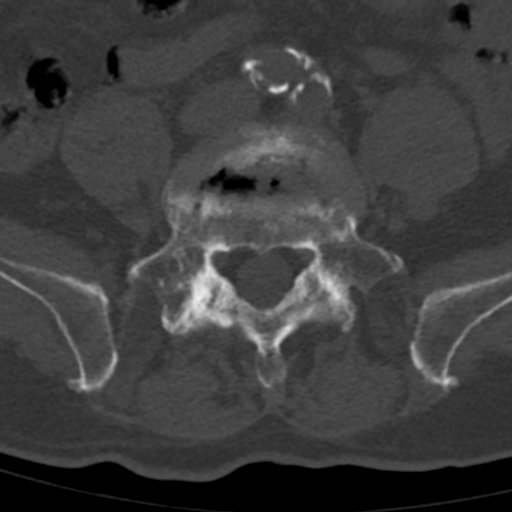
[im 63/117  bone]
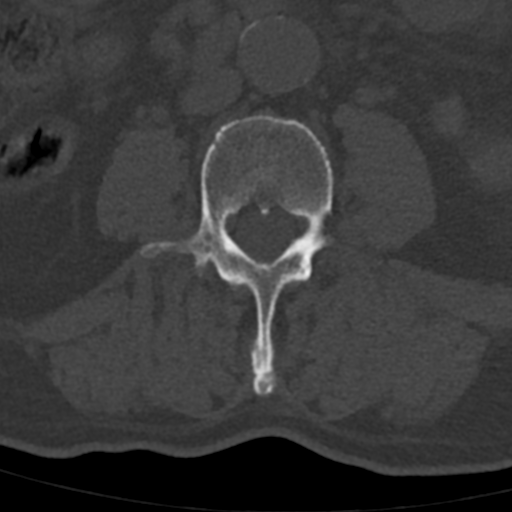
[im 81/117  bone]
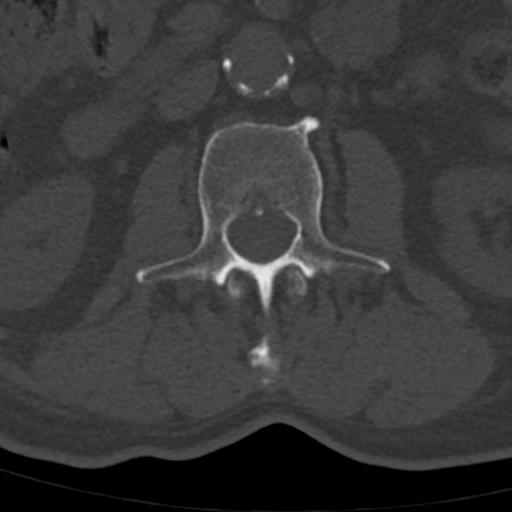
[im 99/117  soft-tissue]
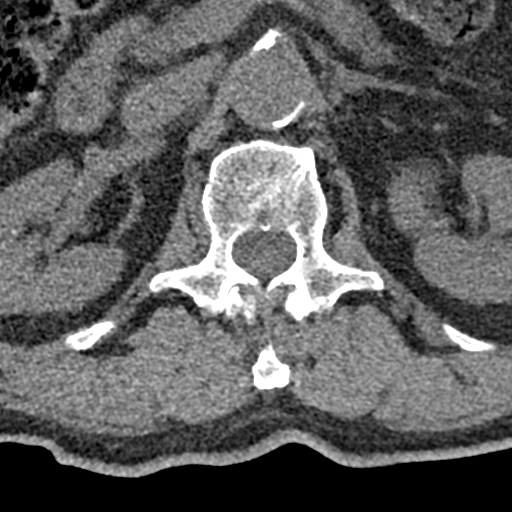
[im 99/117  bone]
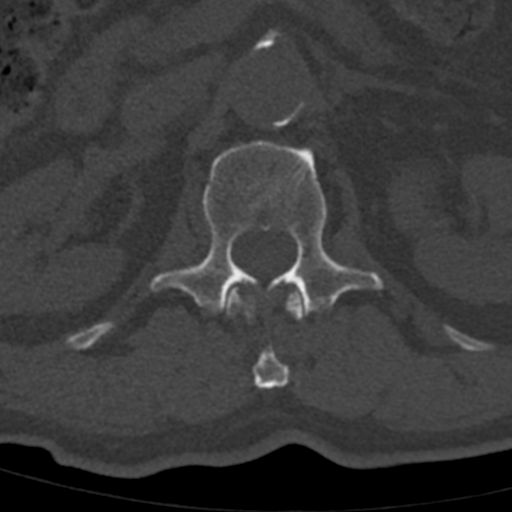

[Series 7: sagittal bone · sagittal · 0.34mm/px · 5 of 72 slices shown, 6 images]
[im 24/72  bone]
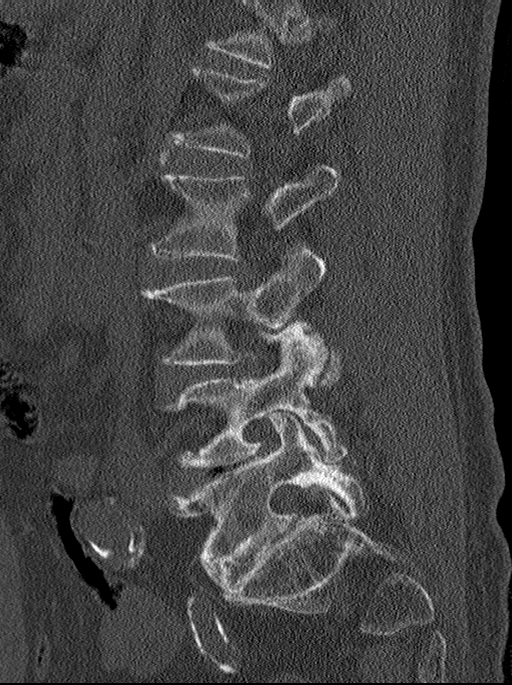
[im 30/72  bone]
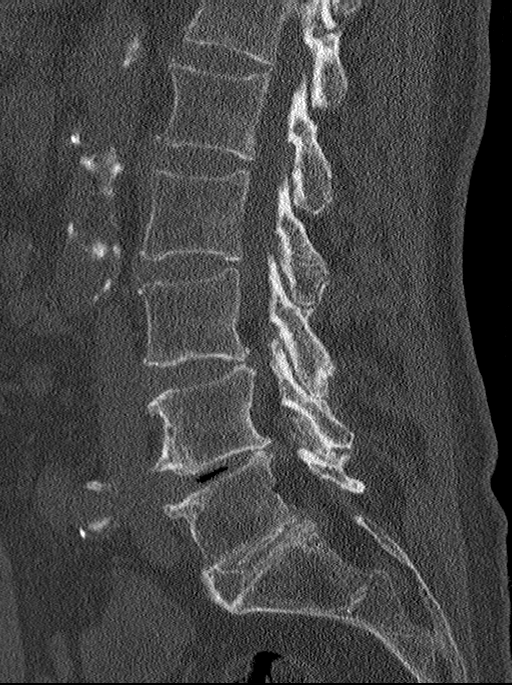
[im 36/72  soft-tissue]
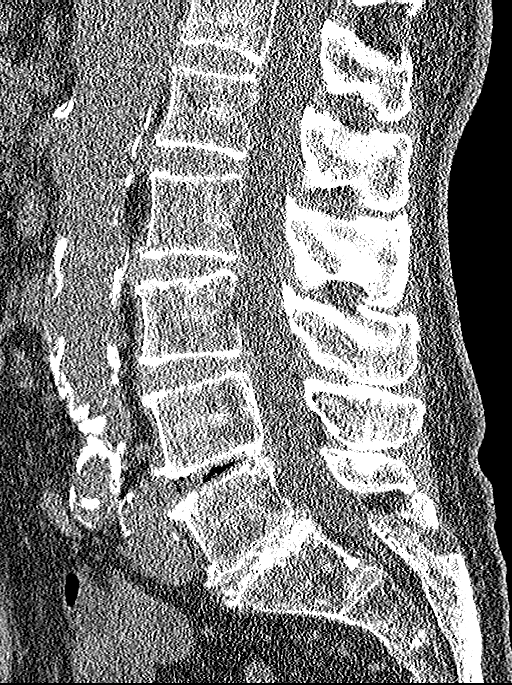
[im 36/72  bone]
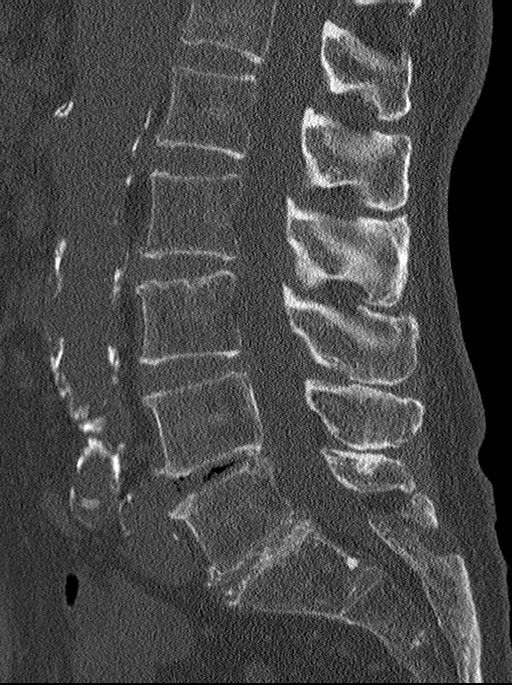
[im 42/72  bone]
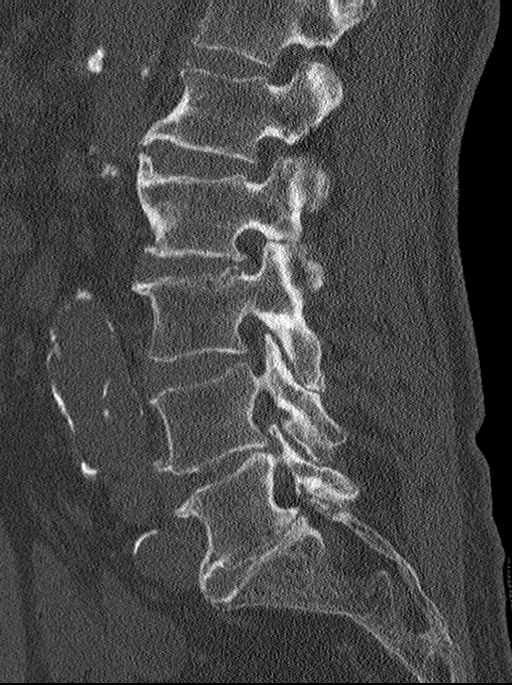
[im 48/72  bone]
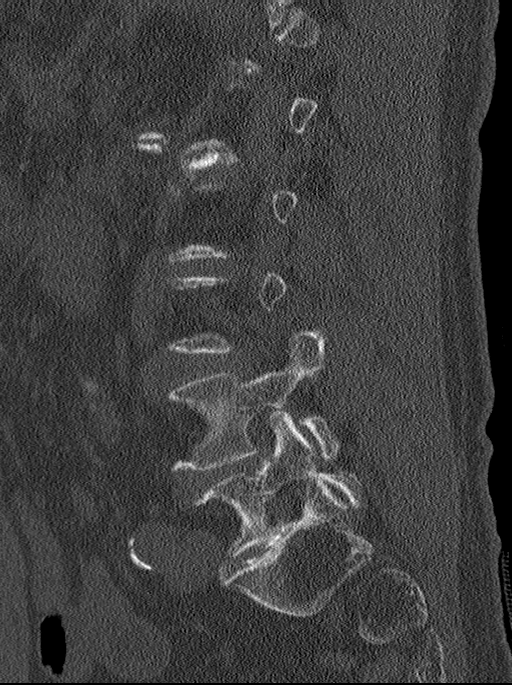

[Series 8: coronal bone · coronal · 0.34mm/px · 3 of 68 slices shown]
[im 17/68  bone]
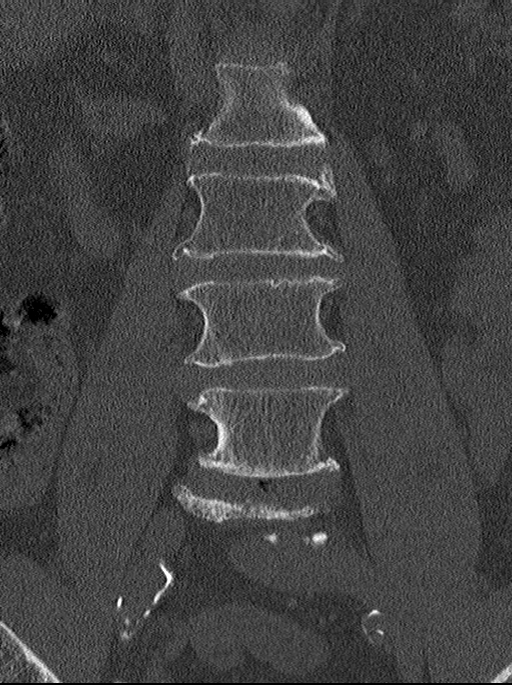
[im 34/68  bone]
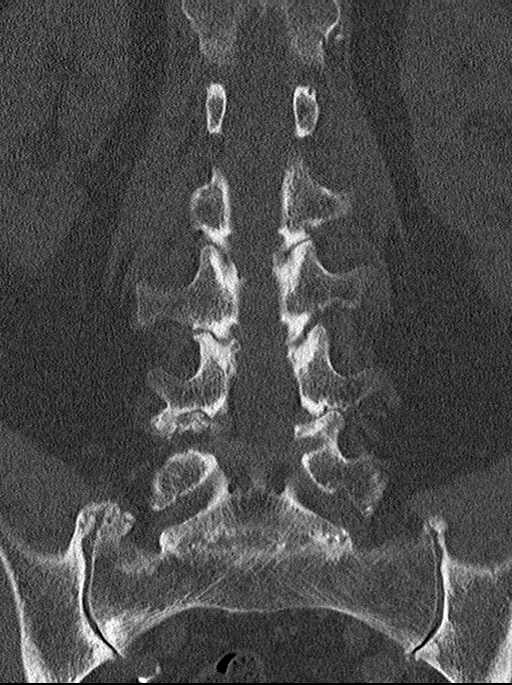
[im 51/68  bone]
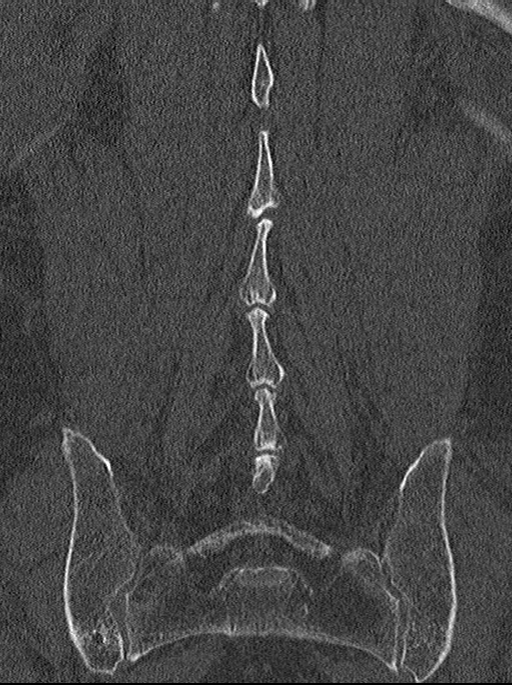

[13 of 33 positions shown; findings below may reference images not displayed]

FINDINGS: Segmentation: There are 5 non-rib bearing lumbar type vertebral
bodies with the last intervertebral disc space labeled as L5-S1.

Alignment: Normal

Vertebrae: The vertebral body heights are well maintained. No
fracture, malalignment, or pathologic osseous lesions seen.

Paraspinal and other soft tissues: The paraspinal soft tissues and
visualized retroperitoneal structures are unremarkable. The
sacroiliac joints are intact. Dense aortic atherosclerosis is noted.

Disc levels: Lumbar spine spondylosis is seen with disc height loss,
anterior osteophytes, and facet arthropathy this is most notable at
L4-L5 and L5-S1 with neural foraminal and canal narrowing.
IMPRESSION: 1. No acute fracture or malalignment.
2. Lumbar spine spondylosis most notable at L4-L5 and L5-S1.
3.  Aortic Atherosclerosis ([id number]).

## 2022-03-25 ENCOUNTER — Emergency Department: Payer: Medicare Other

## 2022-03-25 ENCOUNTER — Inpatient Hospital Stay
Admission: EM | Admit: 2022-03-25 | Discharge: 2022-03-30 | DRG: 057 | Disposition: A | Discharge status: Home Health | Payer: Medicare Other | Attending: Internal Medicine | Admitting: Internal Medicine

## 2022-03-25 ENCOUNTER — Other Ambulatory Visit: Payer: Self-pay

## 2022-03-25 DIAGNOSIS — I951 Orthostatic hypotension: Secondary | ICD-10-CM | POA: Diagnosis not present

## 2022-03-25 DIAGNOSIS — H409 Unspecified glaucoma: Secondary | ICD-10-CM | POA: Diagnosis present

## 2022-03-25 DIAGNOSIS — W19XXXA Unspecified fall, initial encounter: Secondary | ICD-10-CM | POA: Diagnosis not present

## 2022-03-25 DIAGNOSIS — D696 Thrombocytopenia, unspecified: Secondary | ICD-10-CM | POA: Diagnosis present

## 2022-03-25 DIAGNOSIS — G20A1 Parkinson's disease without dyskinesia, without mention of fluctuations: Secondary | ICD-10-CM | POA: Diagnosis not present

## 2022-03-25 DIAGNOSIS — T502X5A Adverse effect of carbonic-anhydrase inhibitors, benzothiadiazides and other diuretics, initial encounter: Secondary | ICD-10-CM | POA: Diagnosis present

## 2022-03-25 DIAGNOSIS — N138 Other obstructive and reflux uropathy: Secondary | ICD-10-CM | POA: Diagnosis present

## 2022-03-25 DIAGNOSIS — Z87891 Personal history of nicotine dependence: Secondary | ICD-10-CM

## 2022-03-25 DIAGNOSIS — R42 Dizziness and giddiness: Secondary | ICD-10-CM | POA: Diagnosis not present

## 2022-03-25 DIAGNOSIS — E785 Hyperlipidemia, unspecified: Secondary | ICD-10-CM | POA: Diagnosis present

## 2022-03-25 DIAGNOSIS — I110 Hypertensive heart disease with heart failure: Secondary | ICD-10-CM | POA: Diagnosis present

## 2022-03-25 DIAGNOSIS — Z66 Do not resuscitate: Secondary | ICD-10-CM | POA: Diagnosis present

## 2022-03-25 DIAGNOSIS — Z887 Allergy status to serum and vaccine status: Secondary | ICD-10-CM

## 2022-03-25 DIAGNOSIS — G629 Polyneuropathy, unspecified: Secondary | ICD-10-CM | POA: Diagnosis present

## 2022-03-25 DIAGNOSIS — K219 Gastro-esophageal reflux disease without esophagitis: Secondary | ICD-10-CM | POA: Diagnosis present

## 2022-03-25 DIAGNOSIS — I2510 Atherosclerotic heart disease of native coronary artery without angina pectoris: Secondary | ICD-10-CM | POA: Diagnosis present

## 2022-03-25 DIAGNOSIS — I5032 Chronic diastolic (congestive) heart failure: Secondary | ICD-10-CM | POA: Diagnosis present

## 2022-03-25 DIAGNOSIS — I4891 Unspecified atrial fibrillation: Secondary | ICD-10-CM | POA: Diagnosis present

## 2022-03-25 DIAGNOSIS — K861 Other chronic pancreatitis: Secondary | ICD-10-CM | POA: Diagnosis present

## 2022-03-25 DIAGNOSIS — R5381 Other malaise: Secondary | ICD-10-CM | POA: Diagnosis present

## 2022-03-25 DIAGNOSIS — G2581 Restless legs syndrome: Secondary | ICD-10-CM | POA: Diagnosis present

## 2022-03-25 DIAGNOSIS — Z1152 Encounter for screening for COVID-19: Secondary | ICD-10-CM

## 2022-03-25 DIAGNOSIS — I714 Abdominal aortic aneurysm, without rupture, unspecified: Secondary | ICD-10-CM | POA: Diagnosis present

## 2022-03-25 DIAGNOSIS — H8110 Benign paroxysmal vertigo, unspecified ear: Secondary | ICD-10-CM | POA: Diagnosis present

## 2022-03-25 DIAGNOSIS — I252 Old myocardial infarction: Secondary | ICD-10-CM

## 2022-03-25 DIAGNOSIS — I10 Essential (primary) hypertension: Secondary | ICD-10-CM | POA: Diagnosis present

## 2022-03-25 DIAGNOSIS — I509 Heart failure, unspecified: Secondary | ICD-10-CM

## 2022-03-25 DIAGNOSIS — R531 Weakness: Secondary | ICD-10-CM

## 2022-03-25 DIAGNOSIS — F32A Depression, unspecified: Secondary | ICD-10-CM | POA: Diagnosis present

## 2022-03-25 DIAGNOSIS — Z79899 Other long term (current) drug therapy: Secondary | ICD-10-CM

## 2022-03-25 DIAGNOSIS — I480 Paroxysmal atrial fibrillation: Secondary | ICD-10-CM | POA: Diagnosis present

## 2022-03-25 DIAGNOSIS — I700 Atherosclerosis of aorta: Secondary | ICD-10-CM | POA: Diagnosis present

## 2022-03-25 DIAGNOSIS — Z7901 Long term (current) use of anticoagulants: Secondary | ICD-10-CM

## 2022-03-25 DIAGNOSIS — Y92009 Unspecified place in unspecified non-institutional (private) residence as the place of occurrence of the external cause: Secondary | ICD-10-CM

## 2022-03-25 DIAGNOSIS — N401 Enlarged prostate with lower urinary tract symptoms: Secondary | ICD-10-CM | POA: Diagnosis present

## 2022-03-25 LAB — COMPREHENSIVE METABOLIC PANEL
ALT: 7 U/L (ref 0–44)
AST: 23 U/L (ref 15–41)
Albumin: 4.1 g/dL (ref 3.5–5.0)
Alkaline Phosphatase: 87 U/L (ref 38–126)
Anion gap: 7 (ref 5–15)
BUN: 22 mg/dL (ref 8–23)
CO2: 30 mmol/L (ref 22–32)
Calcium: 9.4 mg/dL (ref 8.9–10.3)
Chloride: 100 mmol/L (ref 98–111)
Creatinine, Ser: 0.9 mg/dL (ref 0.61–1.24)
GFR, Estimated: >60 mL/min (ref >60)
Glucose, Bld: 105 mg/dL — ABNORMAL HIGH (ref 70–99)
Potassium: 4.2 mmol/L (ref 3.5–5.1)
Sodium: 137 mmol/L (ref 135–145)
Total Bilirubin: 2.4 mg/dL — ABNORMAL HIGH (ref 0.3–1.2)
Total Protein: 7 g/dL (ref 6.5–8.1)

## 2022-03-25 LAB — CBC
HCT: 41.6 % (ref 39.0–52.0)
Hemoglobin: 14 g/dL (ref 13.0–17.0)
MCH: 32.1 pg (ref 26.0–34.0)
MCHC: 33.7 g/dL (ref 30.0–36.0)
MCV: 95.4 fL (ref 80.0–100.0)
Platelets: 142 10*3/uL — ABNORMAL LOW (ref 150–400)
RBC: 4.36 MIL/uL (ref 4.22–5.81)
RDW: 12.5 % (ref 11.5–15.5)
WBC: 4.8 10*3/uL (ref 4.0–10.5)
nRBC: 0 % (ref 0.0–0.2)

## 2022-03-25 LAB — SARS CORONAVIRUS 2 BY RT PCR: SARS Coronavirus 2 by RT PCR: NEGATIVE

## 2022-03-25 MED ORDER — LACTATED RINGERS IV BOLUS
1000.0000 mL | Freq: Once | INTRAVENOUS | Status: AC
  Administered 2022-03-25: 1000 mL via INTRAVENOUS

## 2022-03-25 MED ORDER — CARBIDOPA-LEVODOPA 25-100 MG PO TABS
2.0000 | ORAL_TABLET | Freq: Four times a day (QID) | ORAL | Status: DC
  Administered 2022-03-26 – 2022-03-30 (×14): 2 via ORAL
  Filled 2022-03-25 (×17): qty 2

## 2022-03-25 NOTE — H&P (Addendum)
History and Physical    Chief Complaint: Fall   HISTORY OF PRESENT ILLNESS: Scott Cortez is an 88 y.o. male seen and prepped by EMS today for dizziness and found without loss of consciousness*having injury/trauma to the head patient has parkinsonism and right hip pain and is limited with walking and uses a walker and reports weakness.  Patient is unable to stand gets dizzy and falls.  Patient states that he was just going to the kitchen and he fell he felt very dizzy prior to him falling he knew he was falling and could not do anything to stop it but then he also says that he did not realize.  I suspect patient has a component of syncope and has fall but most likely it is volume and blood pressure related most likely orthostatic hypotension secondary to diuretic therapy. Patient sees Dr. Clayborn Bigness for cardiology.  Pt has PMH as below: Past Medical History:  Diagnosis Date   AAA (abdominal aortic aneurysm) ([place])    a.) CTA 12/31/2015: measured 3.1 cm. b.) CTA 03/07/2019: measured 3.5 cm. c.) CT abdomen 05/05/2021: measured  2.8 cm. d.) CTA 05/17/2021: measured 3.2 cm   Acute MI, inferior wall ([place]) 11/19/2003   a.) LHC 11/22/2003: EF 49%; 20% pRCA, 90% mRCA, 20% LM, 20% pLCx, 20% mLAD, 20% D1; unable to perform PCI --> transferred to [facility] and stent (unknown type) was placed to the RCA.   Aortic atherosclerosis (HCC)    Atrial fibrillation (HCC)    a.) CHA2DS2-VASc = 5 (age x 2, CHF, HTN, previous MI/aortic plaque). b.) rate/hythm maintained on oral carvedilol; chronically anticoagulated using apixaban.   Bladder neck obstruction    BPH (benign prostatic hyperplasia)    Bradycardia    Cardiac murmur    Cardiomyopathy (HCC)    CHF (congestive heart failure) ([place])    a.) TTE 01/05/2014: EF 50%; mild LA enlargement; triv AR//PR, mild MR/TR; G1DD. b.) TTE 03/08/2019: EF 60-65%; RV mildly enlarged. c.) TTE 09/26/2020: EF 30%; mild LVH; mild LA enlargement; mild MR/TR/PR.   Chronic  pancreatitis (HCC)    Coronary artery disease    a.) inferior wall MI 11/19/2003; LHC 11/22/2003: EF 49%; 20% pRCA, 90% mRCA, 20% LM, 20% pLCx, 20% mLAD, 20% D1; unable to perform PCI --> transferred to [facility] and stent (unknown type) was placed to the RCA. b.) MI 2007/2008 (date unknown); PCI placing a second stent (unknown type) to RCA. c.) LHC 02/02/2009: 50% RPLS; intervention deferred opting for med mgmt.   Current use of long term anticoagulation    a.) apixaban   Dental bridge present    upper - bilateral   Depression    Frequent falls    GERD (gastroesophageal reflux disease)    Glaucoma    Hyperlipidemia    Hypertension    Iliac artery aneurysm, left ([place])    a.)  CTA 05/17/2021: measured 1.7 cm; previously 1.5 cm in 2015.   MI (myocardial infarction) ([place]) 05/02/2007   a.) inferoposterolateral MI 05/02/2007 - PCI placing a stent (unknown type) to the RCA   Neuropathy of both feet    Osteoarthritis    Parkinson's disease    RLS (restless legs syndrome)    a.) takes ropinirole   Review of Systems  Constitutional:  Positive for fatigue.  Endocrine: Positive for cold intolerance.  Musculoskeletal:        Fall  All other systems reviewed and are negative.   Allergies  Allergen Reactions   Pneumococcal Vaccines Swelling  Past Surgical History:  Procedure Laterality Date   CATARACT EXTRACTION W/PHACO Left 02/02/2019   Procedure: CATARACT EXTRACTION PHACO AND INTRAOCULAR LENS PLACEMENT (IOC) LEFT ISTENT INJ AND ISTENT 01:09.1        11.3%          8.47;  Surgeon: [name], MD;  Location: [place];  Service: Ophthalmology;  Laterality: Left;   CATARACT EXTRACTION W/PHACO Right 02/23/2019   Procedure: CATARACT EXTRACTION PHACO AND INTRAOCULAR LENS PLACEMENT (IOC) RIGHT ISTENT INJ 01:09.1  14.1%  9.78;  Surgeon: [name], MD;  Location: [place];  Service: Ophthalmology;  Laterality: Right;   COLONOSCOPY N/A 10/15/2014   Procedure:  COLONOSCOPY;  Surgeon: [name], MD;  Location: [facility] ENDOSCOPY;  Service: Endoscopy;  Laterality: N/A;   CORONARY ANGIOPLASTY WITH STENT PLACEMENT Left 11/16/2003   PCI and stent placement to the RCA (unknown type)   CORONARY ANGIOPLASTY WITH STENT PLACEMENT Left 05/02/2007   PCI with stenting of the RCA (unknown type)   ESOPHAGOGASTRODUODENOSCOPY (EGD) WITH PROPOFOL N/A 10/15/2014   Procedure: ESOPHAGOGASTRODUODENOSCOPY (EGD) WITH PROPOFOL;  Surgeon: [name], MD;  Location: [place];  Service: Endoscopy;  Laterality: N/A;   INSERTION OF ANTERIOR SEGMENT AQUEOUS DRAINAGE DEVICE (ISTENT) Left 02/02/2019   Procedure: INSERTION OF ANTERIOR SEGMENT AQUEOUS DRAINAGE DEVICE (ISTENT);  Surgeon: [name], MD;  Location: [place];  Service: Ophthalmology;  Laterality: Left;   LEFT HEART CATH AND CORONARY ANGIOGRAPHY Left 02/02/2009   Procedure: LEFT HEART CATH AND CORONARY ANGIOGRAPHY; Location: [place]; Surgeon: Katrine Coho, MD   SAVORY DILATION N/A 10/15/2014   Procedure: Azzie Almas DILATION;  Surgeon: [name], MD;  Location: [facility] ENDOSCOPY;  Service: Endoscopy;  Laterality: N/A;   XI ROBOTIC ASSISTED INGUINAL HERNIA REPAIR WITH MESH  07/27/2021   Procedure: XI ROBOTIC ASSISTED INGUINAL HERNIA REPAIR WITH MESH;  Surgeon: Benjamine Sprague, DO;  Location: [facility] ORS;  Service: General;;      Social History   Socioeconomic History   Marital status: Single    Spouse name: Not on file   Number of children: Not on file   Years of education: Not on file   Highest education level: Not on file  Occupational History   Not on file  Tobacco Use   Smoking status: Former    Types: Cigarettes    Quit date: 59    Years since quitting: 48.8   Smokeless tobacco: Never  Vaping Use   Vaping Use: Never used  Substance and Sexual Activity   Alcohol use: No   Drug use: Not Currently    Types: Marijuana    Comment: none since 2019   Sexual activity: Yes     Birth control/protection: None  Other Topics Concern   Not on file  Social History Narrative   Not on file   Social Determinants of Health   Financial Resource Strain: Not on file  Food Insecurity: Not on file  Transportation Needs: Not on file  Physical Activity: Not on file  Stress: Not on file  Social Connections: Not on file      CURRENT MEDS:     Current Outpatient Medications (Cardiovascular):    atorvastatin (LIPITOR) 20 MG tablet, Take 20 mg by mouth daily.   carvedilol (COREG) 12.5 MG tablet, Take 12.5 mg by mouth 2 (two) times daily with a meal.   furosemide (LASIX) 20 MG tablet, Take 20 mg by mouth daily.   losartan (COZAAR) 25 MG tablet, Take 25 mg  by mouth daily in the afternoon. At 1400     Current Outpatient Medications (Analgesics):    acetaminophen (TYLENOL) 500 MG tablet, Take 1,000 mg by mouth every 8 (eight) hours as needed for mild pain.   oxyCODONE-acetaminophen (PERCOCET) 5-325 MG tablet, Take 1 tablet by mouth every 8 (eight) hours as needed for severe pain.   Current Outpatient Medications (Hematological):    apixaban (ELIQUIS) 5 MG TABS tablet, Take 1 tablet (5 mg total) by mouth 2 (two) times daily.  Current Facility-Administered Medications (Other):    carbidopa-levodopa (SINEMET IR) 25-100 MG per tablet immediate release 2 tablet  Current Outpatient Medications (Other):    carbidopa-levodopa (SINEMET CR) 50-200 MG tablet, Take 1 tablet by mouth at bedtime.   carbidopa-levodopa (SINEMET IR) 25-100 MG tablet, Take 2 tablets by mouth 4 (four) times daily. At 0800, 1100, 1400, 1700   polyethylene glycol (MIRALAX / GLYCOLAX) 17 g packet, Take 17 g by mouth daily as needed for mild constipation.   rOPINIRole (REQUIP) 0.25 MG tablet, Take 0.25 mg by mouth at bedtime.   tamsulosin (FLOMAX) 0.4 MG CAPS capsule, Take 1 capsule (0.4 mg total) by mouth daily. (Patient taking differently: Take 0.4 mg by mouth daily with supper. At 1700)    ED  Course: Pt in Ed patient is alert awake oriented, exam is nonfocal. Vitals:   03/25/22 1848 03/25/22 1852 03/25/22 2355  BP: (!) 145/101  117/69  Pulse: 67  75  Resp: 18  16  Temp:  98 F (36.7 C)   TempSrc:  Oral   SpO2: 100%  96%   No intake/output data recorded. SpO2: 96 % Blood work in ed shows: Platelets 142, glucose 105, normal kidney function normal LFTs. Total bili of 2.4 normal AST ALT alk phos of 87, COVID-negative. CT head is negative for any acute findings, chest x-ray shows subcutaneous emphysema in the right and left chest wall that is presumed to be postsurgical, hip x-ray shows severe degenerative changes.  EKG shows A-fib at 93 with no ST-T wave changes. Results for orders placed or performed during the hospital encounter of 03/25/22 (from the past 48 hour(s))  CBC     Status: Abnormal   Collection Time: 03/25/22  6:55 PM  Result Value Ref Range   WBC 4.8 4.0 - 10.5 K/uL   RBC 4.36 4.22 - 5.81 MIL/uL   Hemoglobin 14.0 13.0 - 17.0 g/dL   HCT 41.6 39.0 - 52.0 %   MCV 95.4 80.0 - 100.0 fL   MCH 32.1 26.0 - 34.0 pg   MCHC 33.7 30.0 - 36.0 g/dL   RDW 12.5 11.5 - 15.5 %   Platelets 142 (L) 150 - 400 K/uL   nRBC 0.0 0.0 - 0.2 %    Comment: Performed at [facility], [address] [place], [place] [address]  Comprehensive metabolic panel     Status: Abnormal   Collection Time: 03/25/22  6:55 PM  Result Value Ref Range   Sodium 137 135 - 145 mmol/L   Potassium 4.2 3.5 - 5.1 mmol/L   Chloride 100 98 - 111 mmol/L   CO2 30 22 - 32 mmol/L   Glucose, Bld 105 (H) 70 - 99 mg/dL    Comment: Glucose reference range applies only to samples taken after fasting for at least 8 hours.   BUN 22 8 - 23 mg/dL   Creatinine, Ser 0.90 0.61 - 1.24 mg/dL   Calcium 9.4 8.9 - 10.3 mg/dL   Total Protein 7.0  6.5 - 8.1 g/dL   Albumin 4.1 3.5 - 5.0 g/dL   AST 23 15 - 41 U/L   ALT 7 0 - 44 U/L   Alkaline Phosphatase 87 38 - 126 U/L   Total Bilirubin 2.4 (H) 0.3 - 1.2 mg/dL    GFR, Estimated >60 >60 mL/min    Comment: (NOTE) Calculated using the CKD-EPI Creatinine Equation (2021)    Anion gap 7 5 - 15    Comment: Performed at [facility], [address] [place], [place] [address]  SARS Coronavirus 2 by RT PCR (hospital order, performed in [facility] hospital lab) *cepheid single result test* Anterior Nasal Swab     Status: None   Collection Time: 03/25/22  8:38 PM   Specimen: Anterior Nasal Swab  Result Value Ref Range   SARS Coronavirus 2 by RT PCR NEGATIVE NEGATIVE    Comment: (NOTE) SARS-CoV-2 target nucleic acids are NOT DETECTED.  The SARS-CoV-2 RNA is generally detectable in upper and lower respiratory specimens during the acute phase of infection. The lowest concentration of SARS-CoV-2 viral copies this assay can detect is 250 copies / mL. A negative result does not preclude SARS-CoV-2 infection and should not be used as the sole basis for treatment or other patient management decisions.  A negative result may occur with improper specimen collection / handling, submission of specimen other than nasopharyngeal swab, presence of viral mutation(s) within the areas targeted by this assay, and inadequate number of viral copies (<250 copies / mL). A negative result must be combined with clinical observations, patient history, and epidemiological information.  Fact Sheet for Patients:   https://www..info/  Fact Sheet for Healthcare Providers: https://hall.com/  This test is not yet approved or  cleared by the [place] FDA and has been authorized for detection and/or diagnosis of SARS-CoV-2 by FDA under an Emergency Use Authorization (EUA).  This EUA will remain in effect (meaning this test can be used) for the duration of the COVID-19 declaration under Section 564(b)(1) of the Act, 21 U.S.C. section 360bbb-3(b)(1), unless the authorization is terminated or revoked sooner.  Performed at  [facility], [address] [place], [place] [address]     In Ed pt received  Meds ordered this encounter  Medications   lactated ringers bolus 1,000 mL   carbidopa-levodopa (SINEMET IR) 25-100 MG per tablet immediate release 2 tablet    Unresulted Labs (From admission, onward)     Start     Ordered   03/25/22 2308  CK  Once,   URGENT        03/25/22 2308   03/25/22 1852  Urinalysis, Routine w reflex microscopic  Once,   URGENT        03/25/22 1852             Admission Imaging : DG Hip Unilat W or Wo Pelvis 2-3 Views Right  Result Date: 03/25/2022 CLINICAL DATA:  Status post fall. EXAM: DG HIP (WITH OR WITHOUT PELVIS) 2-3V RIGHT COMPARISON:  None Available. FINDINGS: There is no evidence of hip fracture or dislocation. Moderate to marked severity degenerative changes are seen in the form of joint space narrowing and acetabular sclerosis. Marked severity vascular calcification is seen. IMPRESSION: Moderate to marked severity degenerative changes without evidence of acute fracture or dislocation. Electronically Signed   By: Virgina Norfolk M.D.   On: 03/25/2022 20:06   CT HEAD WO CONTRAST  Result Date: 03/25/2022 CLINICAL DATA:  Head trauma, minor (Age >= 65y)  EXAM: CT HEAD WITHOUT CONTRAST TECHNIQUE: Contiguous axial images were obtained from the base of the skull through the vertex without intravenous contrast. RADIATION DOSE REDUCTION: This exam was performed according to the departmental dose-optimization program which includes automated exposure control, adjustment of the mA and/or kV according to patient size and/or use of iterative reconstruction technique. COMPARISON:  CT head 12/22/2020. FINDINGS: Brain: No evidence of acute infarction, hemorrhage, hydrocephalus, extra-axial collection or mass lesion/mass effect. Similar remote left frontal infarct and chronic microvascular ischemic disease. Similar generalized atrophy. Similar dural mineralization. Vascular:  No hyperdense vessel.  Calcific atherosclerosis. Skull: No acute fracture. Sinuses/Orbits: Clear sinuses.  No acute orbital findings. Other: No mastoid effusions. IMPRESSION: No evidence of acute intracranial abnormality. Electronically Signed   By: Margaretha Sheffield M.D.   On: 03/25/2022 19:34      Physical Examination: Vitals:   03/25/22 1848 03/25/22 1852 03/25/22 2355  BP: (!) 145/101  117/69  Pulse: 67  75  Temp:  98 F (36.7 C)   Resp: 18  16  SpO2: 100%  96%  TempSrc:  Oral    Physical Exam Vitals and nursing note reviewed.  Constitutional:      General: He is not in acute distress.    Appearance: He is not ill-appearing, toxic-appearing or diaphoretic.  HENT:     Head: Normocephalic and atraumatic.     Right Ear: Hearing and external ear normal.     Left Ear: Hearing and external ear normal.     Nose: Nose normal. No nasal deformity.     Mouth/Throat:     Lips: Pink.     Mouth: Mucous membranes are moist.     Tongue: No lesions.     Pharynx: Oropharynx is clear.  Eyes:     Extraocular Movements: Extraocular movements intact.     Pupils: Pupils are equal, round, and reactive to light.  Neck:     Vascular: No carotid bruit.  Cardiovascular:     Rate and Rhythm: Normal rate. Rhythm irregular.     Pulses: Normal pulses.     Heart sounds: Murmur heard.  Pulmonary:     Effort: Pulmonary effort is normal.     Breath sounds: Normal breath sounds.  Abdominal:     General: Bowel sounds are normal. There is no distension.     Palpations: Abdomen is soft. There is no mass.     Tenderness: There is no abdominal tenderness. There is no guarding.     Hernia: No hernia is present.  Musculoskeletal:     Right lower leg: No edema.     Left lower leg: No edema.  Skin:    General: Skin is warm.  Neurological:     General: No focal deficit present.     Mental Status: He is alert and oriented to person, place, and time.     Cranial Nerves: Cranial nerves 2-12 are intact.      Motor: Motor function is intact.  Psychiatric:        Attention and Perception: Attention normal.        Mood and Affect: Mood normal.        Speech: Speech normal.        Behavior: Behavior normal. Behavior is cooperative.        Cognition and Memory: Cognition normal.      Assessment and Plan: * Falls Suspect orthostatic hypotension> Vitals:   03/25/22 1848  BP: (!) 145/101  Fall precaution. Aspiration precautions. PT Consult prior to  D/C.  CHF (congestive heart failure) ([place]) Patient is being followed by cardiology with Dr. Clayborn Bigness.  Patient has history of heart disease congestive heart failure last echocardiogram shows reduced ejection fraction systolic congestive heart failure. Strict I's and O's.   Atrial fibrillation ([place]) We will continue patient on his Eliquis Coreg.   Thrombocytopenia ([place])    Latest Ref Rng & Units 03/25/2022    6:55 PM 05/18/2021    6:26 AM 05/17/2021    2:03 PM  CBC  WBC 4.0 - 10.5 K/uL 4.8  4.4  4.5   Hemoglobin 13.0 - 17.0 g/dL 14.0  13.4  14.0   Hematocrit 39.0 - 52.0 % 41.6  38.3  39.9   Platelets 150 - 400 K/uL 142  157  163   We will follow platelet count. Recheck CBC in AM.   Hypertension Vitals:   03/25/22 1848 03/25/22 1852  BP: (!) 145/101   Pulse: 67   Temp:  98 F (36.7 C)  Resp: 18   SpO2: 100%   TempSrc:  Oral  Orthostatic vital.    CAD (coronary artery disease) Stable, will continue patient on his Coreg, losartan, atorvastatin, Eliquis.  BPH with obstruction/lower urinary tract symptoms Patient continued on Flomax. We will check orthostatics if positive consider discontinuing or midodrine and compression stockings.     DVT prophylaxis:  scd's   Code Status:  Full    Family Communication:   McCaollum,Athenia (Significant other) [phone]  Disposition Plan:  Home    Consults called:  None   Admission status: Inpatient    Unit/ Expected LOS: Med tele.    Para Skeans MD [facility]  6 PM- 2 AM. Please contact me via secure Chat 6 PM-2 AM. [phone] ( Pager ) To contact the [facility] Attending or Consulting provider [place] or covering provider during after hours [place], for this patient.   Check the care team in [facility] and look for a) attending/consulting [facility] provider listed and b) the [facility] team listed Log into [email or web address] and use [place]'s universal password to access. If you do not have the password, please contact the hospital operator. Locate the [facility] provider you are looking for under [facility] and page to a number that you can be directly reached. If you still have difficulty reaching the provider, please page the [facility] (Director on Call) for the Hospitalists listed on amion for assistance. [email or web address] 03/25/2022, 11:58 PM

## 2022-03-25 NOTE — Assessment & Plan Note (Addendum)
Stable, will continue patient on his Coreg, losartan, atorvastatin, Eliquis.

## 2022-03-25 NOTE — Assessment & Plan Note (Signed)
Patient is being followed by cardiology with Dr. Juliann Pares.  Patient has history of heart disease congestive heart failure last echocardiogram shows reduced ejection fraction systolic congestive heart failure. Strict I's and O's.

## 2022-03-25 NOTE — ED Triage Notes (Signed)
Pt arrives via EMS from home- pt states he got dizzy and fell- pt state he did hit his head, but did not lose consciousness- pt having pain in his R hip- pt has parkinsons and uses a walker- pt states he has been feeling weak recently

## 2022-03-25 NOTE — Assessment & Plan Note (Signed)
    Latest Ref Rng & Units 03/25/2022    6:55 PM 05/18/2021    6:26 AM 05/17/2021    2:03 PM  CBC  WBC 4.0 - 10.5 K/uL 4.8  4.4  4.5   Hemoglobin 13.0 - 17.0 g/dL 96.7  89.3  81.0   Hematocrit 39.0 - 52.0 % 41.6  38.3  39.9   Platelets 150 - 400 K/uL 142  157  163   We will follow platelet count. Recheck CBC in AM.

## 2022-03-25 NOTE — ED Provider Notes (Signed)
[facility] Provider Note    Event Date/Time   First MD Initiated Contact with Patient 03/25/22 1847     (approximate)   History   Chief Complaint: Fall   HPI  Scott Cortez is a 88 y.o. male with a history of chronic pancreatitis, atrial fibrillation, Parkinson's disease, CHF who comes the ED complaining of lightheadedness and falling when walking at home with his walker.  Reports that he has been feeling very low energy for the past week or so.  He reports that he is eating well, and that his diet consists of eating 4 vegetables a day and otherwise only cereal.  Reports that he fell onto his right hip and does have pain there.  Also hit the right side of his head on the floor.  No LOC.  No neck pain.     Physical Exam   Triage Vital Signs: ED Triage Vitals  Enc Vitals Group     BP 03/25/22 1848 (!) 145/101     Pulse Rate 03/25/22 1848 67     Resp 03/25/22 1848 18     Temp 03/25/22 1852 98 F (36.7 C)     Temp Source 03/25/22 1852 Oral     SpO2 03/25/22 1848 100 %     Weight --      Height --      Head Circumference --      Peak Flow --      Pain Score --      Pain Loc --      Pain Edu? --      Excl. in GC? --     Most recent vital signs: Vitals:   03/25/22 1848 03/25/22 1852  BP: (!) 145/101   Pulse: 67   Resp: 18   Temp:  98 F (36.7 C)  SpO2: 100%     General: Awake, no distress.  CV:  Good peripheral perfusion.  Irregular rhythm.  Normal rate.  Symmetric distal pulses Resp:  Normal effort.  Clear to auscultation bilaterally Abd:  No distention.  Soft with suprapubic tenderness Other:  Mild right hip tenderness.  No limb shortening.  Intact range of motion.   ED Results / Procedures / Treatments   Labs (all labs ordered are listed, but only abnormal results are displayed) Labs Reviewed  CBC - Abnormal; Notable for the following components:      Result Value   Platelets 142 (*)    All other components within  normal limits  COMPREHENSIVE METABOLIC PANEL - Abnormal; Notable for the following components:   Glucose, Bld 105 (*)    Total Bilirubin 2.4 (*)    All other components within normal limits  SARS CORONAVIRUS 2 BY RT PCR  URINALYSIS, ROUTINE W REFLEX MICROSCOPIC     EKG Interpreted by me Atrial fibrillation, rate of 93.  Normal axis, normal intervals.  Normal QRS ST segments and T waves.   RADIOLOGY CT head interpreted by me, negative for intracranial hemorrhage.  Radiology report reviewed.  X-ray right hip and pelvis interpreted by me, negative for fracture.  Radiology report reviewed.   PROCEDURES:  Procedures   MEDICATIONS ORDERED IN ED: Medications  carbidopa-levodopa (SINEMET IR) 25-100 MG per tablet immediate release 2 tablet (has no administration in time range)  lactated ringers bolus 1,000 mL (1,000 mLs Intravenous New Bag/Given 03/25/22 2015)     IMPRESSION / MDM / ASSESSMENT AND PLAN / ED COURSE  I reviewed the triage vital signs and  the nursing notes.                              Differential diagnosis includes, but is not limited to, intracranial hemorrhage, hydrocephalus, intracranial mass, electrolyte normality, dehydration, anemia, UTI, hip fracture  Patient's presentation is most consistent with acute presentation with potential threat to life or bodily function.  Patient presents with lightheadedness, generalized weakness with a fall at home.  Will obtain CT head and right hip x-ray, check labs.  We will give IV fluids for hydration.   ----------------------------------------- 10:25 PM on 03/25/2022 ----------------------------------------- Labs and imaging unremarkable.  Vital stable.  However, patient remains severely symptomatic, with a precipitous functional decline.  I suspect this is due to decompensation of his Parkinson's disease.  I will give him his dose of Sinemet.  Attempted to obtain orthostatics and ambulate the patient, but he is unable  to maintain himself upright in bed even with assistance.  Case discussed with hospitalist for further evaluation.  He is seeing Dr. Sherryll Burger of [facility] clinic neurology for his Parkinson's disease.      FINAL CLINICAL IMPRESSION(S) / ED DIAGNOSES   Final diagnoses:  Parkinson's disease, unspecified whether dyskinesia present, unspecified whether manifestations fluctuate  Generalized weakness     Rx / DC Orders   ED Discharge Orders     None        Note:  This document was prepared using Dragon voice recognition software and may include unintentional dictation errors.   Sharman Cheek, MD 03/25/22 2226

## 2022-03-25 NOTE — Assessment & Plan Note (Addendum)
Suspect orthostatic hypotension> Vitals:   03/25/22 1848  BP: (!) 145/101  Fall precaution. Aspiration precautions. PT Consult prior to D/C.

## 2022-03-25 NOTE — Assessment & Plan Note (Deleted)
Vitals:   03/25/22 1848 03/25/22 1852  BP: (!) 145/101   Pulse: 67   Temp:  98 F (36.7 C)  Resp: 18   SpO2: 100%   TempSrc:  Oral  Orthostatic vital.

## 2022-03-25 NOTE — Assessment & Plan Note (Signed)
Patient continued on Flomax. We will check orthostatics if positive consider discontinuing or midodrine and compression stockings.

## 2022-03-25 NOTE — Assessment & Plan Note (Signed)
We will continue patient on his Eliquis Coreg.

## 2022-03-25 NOTE — ED Notes (Signed)
Pt unable to stand without maximum assistance, pt only able to stand for approx 2 seconds before falling straight back to the bed. Unable to ambulate at this time. MD notified

## 2022-03-26 ENCOUNTER — Encounter: Payer: Self-pay | Admitting: Internal Medicine

## 2022-03-26 DIAGNOSIS — K219 Gastro-esophageal reflux disease without esophagitis: Secondary | ICD-10-CM | POA: Diagnosis present

## 2022-03-26 DIAGNOSIS — W19XXXD Unspecified fall, subsequent encounter: Secondary | ICD-10-CM | POA: Diagnosis not present

## 2022-03-26 DIAGNOSIS — K861 Other chronic pancreatitis: Secondary | ICD-10-CM | POA: Diagnosis present

## 2022-03-26 DIAGNOSIS — I10 Essential (primary) hypertension: Secondary | ICD-10-CM

## 2022-03-26 DIAGNOSIS — R42 Dizziness and giddiness: Secondary | ICD-10-CM | POA: Diagnosis present

## 2022-03-26 DIAGNOSIS — I2510 Atherosclerotic heart disease of native coronary artery without angina pectoris: Secondary | ICD-10-CM | POA: Diagnosis present

## 2022-03-26 DIAGNOSIS — R5381 Other malaise: Secondary | ICD-10-CM | POA: Diagnosis present

## 2022-03-26 DIAGNOSIS — Z7901 Long term (current) use of anticoagulants: Secondary | ICD-10-CM | POA: Diagnosis not present

## 2022-03-26 DIAGNOSIS — Z66 Do not resuscitate: Secondary | ICD-10-CM | POA: Diagnosis present

## 2022-03-26 DIAGNOSIS — W19XXXA Unspecified fall, initial encounter: Secondary | ICD-10-CM | POA: Diagnosis present

## 2022-03-26 DIAGNOSIS — N401 Enlarged prostate with lower urinary tract symptoms: Secondary | ICD-10-CM | POA: Diagnosis present

## 2022-03-26 DIAGNOSIS — T502X5A Adverse effect of carbonic-anhydrase inhibitors, benzothiadiazides and other diuretics, initial encounter: Secondary | ICD-10-CM | POA: Diagnosis present

## 2022-03-26 DIAGNOSIS — E785 Hyperlipidemia, unspecified: Secondary | ICD-10-CM | POA: Diagnosis present

## 2022-03-26 DIAGNOSIS — G629 Polyneuropathy, unspecified: Secondary | ICD-10-CM | POA: Diagnosis present

## 2022-03-26 DIAGNOSIS — I4891 Unspecified atrial fibrillation: Secondary | ICD-10-CM | POA: Diagnosis not present

## 2022-03-26 DIAGNOSIS — I5032 Chronic diastolic (congestive) heart failure: Secondary | ICD-10-CM | POA: Diagnosis present

## 2022-03-26 DIAGNOSIS — I700 Atherosclerosis of aorta: Secondary | ICD-10-CM | POA: Diagnosis present

## 2022-03-26 DIAGNOSIS — I480 Paroxysmal atrial fibrillation: Secondary | ICD-10-CM | POA: Diagnosis present

## 2022-03-26 DIAGNOSIS — D696 Thrombocytopenia, unspecified: Secondary | ICD-10-CM | POA: Diagnosis present

## 2022-03-26 DIAGNOSIS — G20A1 Parkinson's disease without dyskinesia, without mention of fluctuations: Secondary | ICD-10-CM | POA: Diagnosis not present

## 2022-03-26 DIAGNOSIS — G2581 Restless legs syndrome: Secondary | ICD-10-CM | POA: Diagnosis present

## 2022-03-26 DIAGNOSIS — I714 Abdominal aortic aneurysm, without rupture, unspecified: Secondary | ICD-10-CM | POA: Diagnosis present

## 2022-03-26 DIAGNOSIS — Z87891 Personal history of nicotine dependence: Secondary | ICD-10-CM | POA: Diagnosis not present

## 2022-03-26 DIAGNOSIS — Z887 Allergy status to serum and vaccine status: Secondary | ICD-10-CM | POA: Diagnosis not present

## 2022-03-26 DIAGNOSIS — I110 Hypertensive heart disease with heart failure: Secondary | ICD-10-CM | POA: Diagnosis present

## 2022-03-26 DIAGNOSIS — F32A Depression, unspecified: Secondary | ICD-10-CM | POA: Diagnosis present

## 2022-03-26 DIAGNOSIS — Z1152 Encounter for screening for COVID-19: Secondary | ICD-10-CM | POA: Diagnosis not present

## 2022-03-26 DIAGNOSIS — Z79899 Other long term (current) drug therapy: Secondary | ICD-10-CM | POA: Diagnosis not present

## 2022-03-26 DIAGNOSIS — N138 Other obstructive and reflux uropathy: Secondary | ICD-10-CM | POA: Diagnosis present

## 2022-03-26 DIAGNOSIS — Y92009 Unspecified place in unspecified non-institutional (private) residence as the place of occurrence of the external cause: Secondary | ICD-10-CM | POA: Diagnosis not present

## 2022-03-26 DIAGNOSIS — I252 Old myocardial infarction: Secondary | ICD-10-CM | POA: Diagnosis not present

## 2022-03-26 LAB — COMPREHENSIVE METABOLIC PANEL WITH GFR
ALT: <5 U/L (ref 0–44)
AST: 21 U/L (ref 15–41)
Albumin: 3.4 g/dL — ABNORMAL LOW (ref 3.5–5.0)
Alkaline Phosphatase: 74 U/L (ref 38–126)
Anion gap: 6 (ref 5–15)
BUN: 18 mg/dL (ref 8–23)
CO2: 27 mmol/L (ref 22–32)
Calcium: 9 mg/dL (ref 8.9–10.3)
Chloride: 102 mmol/L (ref 98–111)
Creatinine, Ser: 0.71 mg/dL (ref 0.61–1.24)
GFR, Estimated: >60 mL/min
Glucose, Bld: 95 mg/dL (ref 70–99)
Potassium: 3.9 mmol/L (ref 3.5–5.1)
Sodium: 135 mmol/L (ref 135–145)
Total Bilirubin: 2.7 mg/dL — ABNORMAL HIGH (ref 0.3–1.2)
Total Protein: 5.9 g/dL — ABNORMAL LOW (ref 6.5–8.1)

## 2022-03-26 LAB — URINALYSIS, ROUTINE W REFLEX MICROSCOPIC
Bilirubin Urine: NEGATIVE
Glucose, UA: NEGATIVE mg/dL
Hgb urine dipstick: NEGATIVE
Ketones, ur: 5 mg/dL — AB
Leukocytes,Ua: NEGATIVE
Nitrite: NEGATIVE
Protein, ur: NEGATIVE mg/dL
Specific Gravity, Urine: 1.011 (ref 1.005–1.030)
pH: 7 (ref 5.0–8.0)

## 2022-03-26 LAB — CBC
HCT: 35.8 % — ABNORMAL LOW (ref 39.0–52.0)
Hemoglobin: 12.2 g/dL — ABNORMAL LOW (ref 13.0–17.0)
MCH: 32 pg (ref 26.0–34.0)
MCHC: 34.1 g/dL (ref 30.0–36.0)
MCV: 94 fL (ref 80.0–100.0)
Platelets: 140 K/uL — ABNORMAL LOW (ref 150–400)
RBC: 3.81 MIL/uL — ABNORMAL LOW (ref 4.22–5.81)
RDW: 12.7 % (ref 11.5–15.5)
WBC: 4.9 K/uL (ref 4.0–10.5)
nRBC: 0 % (ref 0.0–0.2)

## 2022-03-26 LAB — CK: Total CK: 77 U/L (ref 49–397)

## 2022-03-26 MED ORDER — CARBIDOPA-LEVODOPA ER 50-200 MG PO TBCR
1.0000 | EXTENDED_RELEASE_TABLET | Freq: Every day | ORAL | Status: DC
  Administered 2022-03-26 – 2022-03-29 (×4): 1 via ORAL
  Filled 2022-03-26 (×6): qty 1

## 2022-03-26 MED ORDER — ROPINIROLE HCL 0.25 MG PO TABS
0.2500 mg | ORAL_TABLET | Freq: Every day | ORAL | Status: DC
  Administered 2022-03-26 – 2022-03-29 (×4): 0.25 mg via ORAL
  Filled 2022-03-26 (×6): qty 1

## 2022-03-26 MED ORDER — SODIUM CHLORIDE 0.9 % IV SOLN: INTRAVENOUS | Status: AC

## 2022-03-26 MED ORDER — ACETAMINOPHEN 500 MG PO TABS: 1000.0000 mg | ORAL_TABLET | Freq: Three times a day (TID) | ORAL | Status: DC | PRN

## 2022-03-26 MED ORDER — CARVEDILOL 6.25 MG PO TABS
6.2500 mg | ORAL_TABLET | Freq: Two times a day (BID) | ORAL | Status: DC
  Administered 2022-03-26 – 2022-03-30 (×6): 6.25 mg via ORAL
  Filled 2022-03-26 (×7): qty 1

## 2022-03-26 MED ORDER — TAMSULOSIN HCL 0.4 MG PO CAPS
0.4000 mg | ORAL_CAPSULE | Freq: Every day | ORAL | Status: DC
  Administered 2022-03-26 – 2022-03-30 (×4): 0.4 mg via ORAL
  Filled 2022-03-26 (×4): qty 1

## 2022-03-26 MED ORDER — LOSARTAN POTASSIUM 25 MG PO TABS
25.0000 mg | ORAL_TABLET | Freq: Every day | ORAL | Status: DC
  Administered 2022-03-26 – 2022-03-29 (×3): 25 mg via ORAL
  Filled 2022-03-26 (×4): qty 1

## 2022-03-26 MED ORDER — CARBIDOPA-LEVODOPA 25-100 MG PO TABS: 2.0000 | ORAL_TABLET | Freq: Four times a day (QID) | ORAL | Status: DC

## 2022-03-26 MED ORDER — OXYCODONE-ACETAMINOPHEN 5-325 MG PO TABS: 1.0000 | ORAL_TABLET | Freq: Three times a day (TID) | ORAL | Status: DC | PRN

## 2022-03-26 MED ORDER — ATORVASTATIN CALCIUM 20 MG PO TABS
20.0000 mg | ORAL_TABLET | Freq: Every day | ORAL | Status: DC
  Administered 2022-03-26 – 2022-03-30 (×4): 20 mg via ORAL
  Filled 2022-03-26 (×4): qty 1

## 2022-03-26 MED ORDER — ACETAMINOPHEN 325 MG PO TABS: 650.0000 mg | ORAL_TABLET | Freq: Four times a day (QID) | ORAL | Status: DC | PRN

## 2022-03-26 MED ORDER — APIXABAN 5 MG PO TABS
5.0000 mg | ORAL_TABLET | Freq: Two times a day (BID) | ORAL | Status: DC
  Administered 2022-03-26 – 2022-03-30 (×7): 5 mg via ORAL
  Filled 2022-03-26 (×7): qty 1

## 2022-03-26 MED ORDER — SODIUM CHLORIDE 0.9% FLUSH
3.0000 mL | Freq: Two times a day (BID) | INTRAVENOUS | Status: DC
  Administered 2022-03-26 – 2022-03-28 (×4): 3 mL via INTRAVENOUS

## 2022-03-26 MED ORDER — CARVEDILOL 6.25 MG PO TABS
12.5000 mg | ORAL_TABLET | Freq: Two times a day (BID) | ORAL | Status: DC
  Administered 2022-03-26: 6.25 mg via ORAL
  Filled 2022-03-26: qty 2

## 2022-03-26 MED ORDER — ACETAMINOPHEN 650 MG RE SUPP: 650.0000 mg | Freq: Four times a day (QID) | RECTAL | Status: DC | PRN

## 2022-03-26 NOTE — Assessment & Plan Note (Signed)
-   Some history of transient thrombocytopenia - Continue trending for now

## 2022-03-26 NOTE — ED Notes (Signed)
Pt yelling out stating he needs the urinal. Pt stating he wants to attempt to use the urinal on his own

## 2022-03-26 NOTE — ED Notes (Addendum)
Consulting civil engineer and this RN at bedside to assist pt with urinal

## 2022-03-26 NOTE — Care Management Obs Status (Signed)
MEDICARE OBSERVATION STATUS NOTIFICATION   Patient Details  Name: Scott Cortez MRN: [id number] Date of Birth: [birth year]   Medicare Observation Status Notification Given:  Yes    Allayne Butcher, RN 03/26/2022, 4:16 PM

## 2022-03-26 NOTE — ED Notes (Signed)
Admission MD at bedside.  

## 2022-03-26 NOTE — ED Notes (Signed)
Pt assisted with 2 staff to use urinal. Pt able to void at this time.

## 2022-03-26 NOTE — TOC Initial Note (Signed)
Transition of Care [facility]) - Initial/Assessment Note    Patient Details  Name: Scott Cortez MRN: [id number] Date of Birth: [birth year]  Transition of Care [facility]) CM/SW Contact:    Shelbie Hutching, RN Phone Number: 03/26/2022, 4:59 PM  Clinical Narrative:                 Patient placed under observation after falling at home yesterday.  Patient has a history of Parkinson's.  RNCM met with patient at the bedside.  He lives alone but he has people that come out 4 times per week, he says they are with [employer] and he doesn't have to pay for them, they come out for about 2 hours at a time and help him clean.  His girlfriend, Duanne Moron will drive him to get groceries, he walks with a walker, he reports that he can get to the bathroom to have a bowel movement when needed but that he usually urinates in pint jars and then empties them and washes them.  He does not drive.   He says that he would go to a nursing facility if he absolutely has to.  He did not really want home health he says that with the elder care people coming out he had enough company.    TOC will cont to follow, current PT and OT recommendation is for SNF.  He is observation and Medicare requires a 3 night inpatient stay.    Expected Discharge Plan: [place] Barriers to Discharge: Continued Medical Work up   Patient Goals and CMS Choice Patient states their goals for this hospitalization and ongoing recovery are:: patient wants to be able to get home and at least get to 80% of where he was before he fell yesterday      Expected Discharge Plan and Services Expected Discharge Plan: [place]   Discharge Planning Services: CM Consult   Living arrangements for the past 2 months: Single Family Home                                      Prior Living Arrangements/Services Living arrangements for the past 2 months: Single Family Home Lives with:: Self Patient language  and need for interpreter reviewed:: Yes Do you feel safe going back to the place where you live?: Yes      Need for Family Participation in Patient Care: Yes (Comment) Care giver support system in place?: Yes (comment) Current home services: DME (walker) Criminal Activity/Legal Involvement Pertinent to Current Situation/Hospitalization: No - Comment as needed  Activities of Daily Living Home Assistive Devices/Equipment: Eyeglasses ADL Screening (condition at time of admission) Patient's cognitive ability adequate to safely complete daily activities?: Yes Is the patient deaf or have difficulty hearing?: No Does the patient have difficulty seeing, even when wearing glasses/contacts?: No Does the patient have difficulty concentrating, remembering, or making decisions?: Yes Patient able to express need for assistance with ADLs?: Yes Does the patient have difficulty dressing or bathing?: Yes Independently performs ADLs?: No Communication: Independent Dressing (OT): Needs assistance Is this a change from baseline?: Change from baseline, expected to last <3days Grooming: Needs assistance Is this a change from baseline?: Change from baseline, expected to last <3 days Feeding: Independent Bathing: Needs assistance Is this a change from baseline?: Change from baseline, expected to last <3 days Toileting: Needs assistance Is this a change from baseline?:  Change from baseline, expected to last >3days In/Out Bed: Dependent Is this a change from baseline?: Change from baseline, expected to last >3 days Walks in Home: Dependent Is this a change from baseline?: Change from baseline, expected to last >3 days Does the patient have difficulty walking or climbing stairs?: Yes Weakness of Legs: Both Weakness of Arms/Hands: Both  Permission Sought/Granted Permission sought to share information with : Case Manager, Family Supports Permission granted to share information with : Yes, Verbal Permission  Granted  Share Information with NAME: Cipriano Mile     Permission granted to share info w Relationship: significant other  Permission granted to share info w Contact Information: [phone]  Emotional Assessment Appearance:: Appears stated age Attitude/Demeanor/Rapport: Engaged Affect (typically observed): Accepting Orientation: : Oriented to Self, Oriented to Place, Oriented to  Time, Oriented to Situation Alcohol / Substance Use: Not Applicable Psych Involvement: No (comment)  Admission diagnosis:  Fall [W19.XXXA] Patient Active Problem List   Diagnosis Date Noted   Fall 03/26/2022   CAD (coronary artery disease) 03/25/2022   Hypertension 03/25/2022   Falls 03/25/2022   Thrombocytopenia ([place]) 03/25/2022   Atrial fibrillation ([place]) 03/25/2022   Chronic diastolic (congestive) heart failure ([place]) 03/25/2022   Right groin pain 05/17/2021   Near syncope 03/07/2019   Nocturia 06/26/2016   Lower abdominal pain 09/04/2015   Erectile dysfunction 08/30/2014   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   Incomplete emptying of bladder 01/26/2014   Increased frequency of urination 01/26/2014   Slowing of urinary stream 01/26/2014   PCP:  [facility], [facility]:   [place], [place] [address] Phone: [phone] Fax: [place], [place] [address] [place] [address] Phone: [phone] Fax: [id number] [address], [place] [address] [place] [address] Phone: [phone] Fax: [phone]     Social Determinants of Health (SDOH) Interventions    Readmission Risk Interventions     No data to display

## 2022-03-26 NOTE — Evaluation (Signed)
Physical Therapy Evaluation Patient Details Name: Scott Cortez MRN: [id number] DOB: 1934/03/23 Today's Date: 03/26/2022  History of Present Illness  Pt is an 88 yo male with PMH Parkinson's disease, A-fib, BPH, CHF, CAD, depression, GERD, HTN, HLD, osteoarthritis, RLS.  He presented to the hospital after feeling dizzy and falling at home. MD assessment includes falls, HTN, chronic CHF, thrombocytopenia, and A-fib.   Clinical Impression  Pt was pleasant and motivated to participate during the session and put forth good effort throughout. Pt required extensive time and effort to perform all functional tasks along with cuing for sequencing.  Pt required extra time to process commands and requested quiet time to process each command.  Pt required physical assistance with transfers and gait to prevent LOB but reported no adverse symptoms with change of position with orthostatic vitals negative: supine 125/81 HR 82, sitting 121/82 HR 85, standing 139/97 HR 102.  Pt is at a very high risk for falls and further functional decline and would not be safe to return to his prior living situation at this time.  Pt will benefit from PT services in a SNF setting upon discharge to safely address deficits listed in patient problem list for decreased caregiver assistance and eventual return to PLOF.         Recommendations for follow up therapy are one component of a multi-disciplinary discharge planning process, led by the attending physician.  Recommendations may be updated based on patient status, additional functional criteria and insurance authorization.  Follow Up Recommendations Skilled nursing-short term rehab (<3 hours/day) Can patient physically be transported by private vehicle: No    Assistance Recommended at Discharge Frequent or constant Supervision/Assistance  Patient can return home with the following  A lot of help with walking and/or transfers;A lot of help with  bathing/dressing/bathroom;Assistance with cooking/housework;Direct supervision/assist for medications management;Assist for transportation;Help with stairs or ramp for entrance    Equipment Recommendations None recommended by PT  Recommendations for Other Services       Functional Status Assessment Patient has had a recent decline in their functional status and/or demonstrates limited ability to make significant improvements in function in a reasonable and predictable amount of time     Precautions / Restrictions Precautions Precautions: Fall Restrictions Weight Bearing Restrictions: No      Mobility  Bed Mobility Overal bed mobility: Needs Assistance Bed Mobility: Supine to Sit, Sit to Supine     Supine to sit: Supervision Sit to supine: Supervision   General bed mobility comments: Extensive time, effort, and use of bed rail along with multiple attempts to perform sup to/from sit    Transfers Overall transfer level: Needs assistance Equipment used: Rolling walker (2 wheels) Transfers: Sit to/from Stand Sit to Stand: Min assist           General transfer comment: Min verbal cues for sequencing with occasional min A for stability in standing    Ambulation/Gait Ambulation/Gait assistance: Min assist Gait Distance (Feet): 2 Feet Assistive device: Rolling walker (2 wheels) Gait Pattern/deviations: Step-through pattern, Decreased step length - right, Decreased step length - left, Trunk flexed, Shuffle Gait velocity: decreased     General Gait Details: Very slow, effortful cadence with min A for stability and mod to max verbal cues for safe use of the RW  Stairs            Wheelchair Mobility    Modified Rankin (Stroke Patients Only)       Balance Overall balance assessment: History of Falls,  Needs assistance   Sitting balance-Leahy Scale: Fair     Standing balance support: Bilateral upper extremity supported, During functional activity, Reliant on  assistive device for balance Standing balance-Leahy Scale: Poor                               Pertinent Vitals/Pain Pain Assessment Pain Assessment: 0-10 Pain Score: 5  Pain Location: R hip Pain Descriptors / Indicators: Sore Pain Intervention(s): Premedicated before session, Monitored during session, Repositioned    Home Living Family/patient expects to be discharged to:: Private residence Living Arrangements: Alone Available Help at Discharge: Family;Friend(s);Available PRN/intermittently Type of Home: House Home Access: Stairs to enter Entrance Stairs-Rails: Right Entrance Stairs-Number of Steps: 5   Home Layout: One level Home Equipment: Agricultural consultant (2 wheels)      Prior Function Prior Level of Function : Independent/Modified Independent             Mobility Comments: Mod Ind amb household distances only with a RW, 3 falls secondary to LOB in last 6 months, uses facility w/c for MD apts and a facility electric scooter/cart at the grocery store ADLs Comments: Defer to OT eval     Hand Dominance        Extremity/Trunk Assessment   Upper Extremity Assessment Upper Extremity Assessment: Generalized weakness    Lower Extremity Assessment Lower Extremity Assessment: Generalized weakness       Communication   Communication: HOH;Other (comment) (Needs significant time and quiet to process after a command is given)  Cognition Arousal/Alertness: Awake/alert Behavior During Therapy: WFL for tasks assessed/performed Overall Cognitive Status: Within Functional Limits for tasks assessed                                 General Comments: Extra time and cuing to follow commands        General Comments      Exercises Other Exercises Other Exercises: Static standing with BUE and single UE support for improved activity tolerance and balance training Other Exercises: Bed mobility training with multiple sup to/from sits Other Exercises:  Anterior weight shifting activities in sitting to promote improved transfer performance   Assessment/Plan    PT Assessment Patient needs continued PT services  PT Problem List Decreased strength;Decreased activity tolerance;Decreased balance;Decreased mobility;Decreased knowledge of use of DME;Pain       PT Treatment Interventions DME instruction;Gait training;Stair training;Functional mobility training;Patient/family education;Therapeutic activities;Therapeutic exercise;Balance training    PT Goals (Current goals can be found in the Care Plan section)  Acute Rehab PT Goals Patient Stated Goal: To get stronger PT Goal Formulation: With patient Time For Goal Achievement: 04/08/22 Potential to Achieve Goals: Fair    Frequency Min 2X/week     Co-evaluation               AM-PAC PT "6 Clicks" Mobility  Outcome Measure Help needed turning from your back to your side while in a flat bed without using bedrails?: A Little Help needed moving from lying on your back to sitting on the side of a flat bed without using bedrails?: A Little Help needed moving to and from a bed to a chair (including a wheelchair)?: A Little Help needed standing up from a chair using your arms (e.g., wheelchair or bedside chair)?: A Little Help needed to walk in hospital room?: Total Help needed climbing 3-5 steps with a railing? :  Total 6 Click Score: 14    End of Session Equipment Utilized During Treatment: Gait belt Activity Tolerance: Patient tolerated treatment well Patient left: in bed;with call bell/phone within reach;with bed alarm set Nurse Communication: Mobility status PT Visit Diagnosis: Unsteadiness on feet (R26.81);History of falling (Z91.81);Difficulty in walking, not elsewhere classified (R26.2);Muscle weakness (generalized) (M62.81);Pain Pain - Right/Left: Right Pain - part of body: Hip    Time: 6283-1517 PT Time Calculation (min) (ACUTE ONLY): 39 min   Charges:   PT  Evaluation $PT Eval Moderate Complexity: 1 Mod PT Treatments $Therapeutic Activity: 8-22 mins       D. Elly Modena PT, DPT 03/26/22, 2:48 PM

## 2022-03-26 NOTE — Progress Notes (Addendum)
Progress Note    Scott Cortez   [id number]  DOB: 1933/09/24  DOA: 03/25/2022     0 PCP: [facility]  Initial CC: fall at home  Hospital Course: Scott Cortez is an 88 yo male with PMH Parkinson's disease, A-fib, BPH, CHF, CAD, depression, GERD, HTN, HLD, osteoarthritis, RLS.  Scott Cortez presented to the hospital after feeling dizzy and falling at home.  Scott Cortez states this is a rare occasion for him and Scott Cortez does not usually fall. Scott Cortez did not have any recent changes to his medications prior to hospitalization. Scott Cortez was admitted for PT evaluation and further observation.  Interval History:  Seen in the ER this morning.  Resting in bed comfortably.  Denies any dizziness.  Assessment and Plan: * Falls - Differential includes medication side effects, autonomic dysfunction due to Parkinson's, orthostatic hypotension - Follow-up orthostatic checks - Continue fluids - Follow-up PT eval  Hypertension - Continue current regimen -Follow-up orthostatics  Chronic diastolic (congestive) heart failure ([place]) - Last echo 03/03/2019: EF 60 to 65%, impaired relaxation consistent with diastolic dysfunction -No S/S exacerbation   Atrial fibrillation ([facility]) - continue coreg and eliquis  Thrombocytopenia ([place]) - Some history of transient thrombocytopenia - Continue trending for now  CAD (coronary artery disease) - Continue Coreg, losartan, atorvastatin, Eliquis.  BPH with obstruction/lower urinary tract symptoms - Continue Flomax   Old records reviewed in assessment of this patient  Antimicrobials:   DVT prophylaxis:   apixaban (ELIQUIS) tablet 5 mg   Code Status:   Code Status: DNR  Mobility Assessment (last 72 hours)     Mobility Assessment     Row Name 03/26/22 0800           Does patient have an order for bedrest or is patient medically unstable No - Continue assessment       What is the highest level of mobility based on the progressive mobility  assessment? Level 3 (Stands with assist) - Balance while standing  and cannot march in place                Barriers to discharge:  Disposition Plan:  Pending PT eval Status is: Obs  Objective: Blood pressure 136/63, pulse 79, temperature 98 F (36.7 C), temperature source Oral, resp. rate 19, SpO2 97 %.  Examination:  Physical Exam Constitutional:      General: Scott Cortez is not in acute distress.    Appearance: Normal appearance.  HENT:     Head: Normocephalic and atraumatic.     Mouth/Throat:     Mouth: Mucous membranes are moist.  Eyes:     Extraocular Movements: Extraocular movements intact.  Cardiovascular:     Rate and Rhythm: Normal rate. Rhythm irregular.     Heart sounds: Normal heart sounds.  Pulmonary:     Effort: Pulmonary effort is normal. No respiratory distress.     Breath sounds: Normal breath sounds. No wheezing.  Abdominal:     General: Bowel sounds are normal. There is no distension.     Palpations: Abdomen is soft.     Tenderness: There is no abdominal tenderness.  Musculoskeletal:        General: Normal range of motion.     Cervical back: Normal range of motion and neck supple.  Skin:    General: Skin is warm and dry.  Neurological:     General: No focal deficit present.     Mental Status: Scott Cortez is alert.  Psychiatric:  Mood and Affect: Mood normal.        Behavior: Behavior normal.      Consultants:    Procedures:    Data Reviewed: Results for orders placed or performed during the hospital encounter of 03/25/22 (from the past 24 hour(s))  CBC     Status: Abnormal   Collection Time: 03/25/22  6:55 PM  Result Value Ref Range   WBC 4.8 4.0 - 10.5 K/uL   RBC 4.36 4.22 - 5.81 MIL/uL   Hemoglobin 14.0 13.0 - 17.0 g/dL   HCT 41.6 39.0 - 52.0 %   MCV 95.4 80.0 - 100.0 fL   MCH 32.1 26.0 - 34.0 pg   MCHC 33.7 30.0 - 36.0 g/dL   RDW 12.5 11.5 - 15.5 %   Platelets 142 (L) 150 - 400 K/uL   nRBC 0.0 0.0 - 0.2 %  Comprehensive metabolic panel      Status: Abnormal   Collection Time: 03/25/22  6:55 PM  Result Value Ref Range   Sodium 137 135 - 145 mmol/L   Potassium 4.2 3.5 - 5.1 mmol/L   Chloride 100 98 - 111 mmol/L   CO2 30 22 - 32 mmol/L   Glucose, Bld 105 (H) 70 - 99 mg/dL   BUN 22 8 - 23 mg/dL   Creatinine, Ser 0.90 0.61 - 1.24 mg/dL   Calcium 9.4 8.9 - 10.3 mg/dL   Total Protein 7.0 6.5 - 8.1 g/dL   Albumin 4.1 3.5 - 5.0 g/dL   AST 23 15 - 41 U/L   ALT 7 0 - 44 U/L   Alkaline Phosphatase 87 38 - 126 U/L   Total Bilirubin 2.4 (H) 0.3 - 1.2 mg/dL   GFR, Estimated >60 >60 mL/min   Anion gap 7 5 - 15  SARS Coronavirus 2 by RT PCR (hospital order, performed in [place] hospital lab) *cepheid single result test* Anterior Nasal Swab     Status: None   Collection Time: 03/25/22  8:38 PM   Specimen: Anterior Nasal Swab  Result Value Ref Range   SARS Coronavirus 2 by RT PCR NEGATIVE NEGATIVE  Urinalysis, Routine w reflex microscopic     Status: Abnormal   Collection Time: 03/25/22 11:55 PM  Result Value Ref Range   Color, Urine YELLOW (A) YELLOW   APPearance CLEAR (A) CLEAR   Specific Gravity, Urine 1.011 1.005 - 1.030   pH 7.0 5.0 - 8.0   Glucose, UA NEGATIVE NEGATIVE mg/dL   Hgb urine dipstick NEGATIVE NEGATIVE   Bilirubin Urine NEGATIVE NEGATIVE   Ketones, ur 5 (A) NEGATIVE mg/dL   Protein, ur NEGATIVE NEGATIVE mg/dL   Nitrite NEGATIVE NEGATIVE   Leukocytes,Ua NEGATIVE NEGATIVE  CK     Status: None   Collection Time: 03/26/22  5:29 AM  Result Value Ref Range   Total CK 77 49 - 397 U/L  Comprehensive metabolic panel     Status: Abnormal   Collection Time: 03/26/22  5:29 AM  Result Value Ref Range   Sodium 135 135 - 145 mmol/L   Potassium 3.9 3.5 - 5.1 mmol/L   Chloride 102 98 - 111 mmol/L   CO2 27 22 - 32 mmol/L   Glucose, Bld 95 70 - 99 mg/dL   BUN 18 8 - 23 mg/dL   Creatinine, Ser 0.71 0.61 - 1.24 mg/dL   Calcium 9.0 8.9 - 10.3 mg/dL   Total Protein 5.9 (L) 6.5 - 8.1 g/dL   Albumin 3.4 (L) 3.5 - 5.0  g/dL   AST 21 15 - 41 U/L   ALT <5 0 - 44 U/L   Alkaline Phosphatase 74 38 - 126 U/L   Total Bilirubin 2.7 (H) 0.3 - 1.2 mg/dL   GFR, Estimated >30 >16 mL/min   Anion gap 6 5 - 15  CBC     Status: Abnormal   Collection Time: 03/26/22  5:29 AM  Result Value Ref Range   WBC 4.9 4.0 - 10.5 K/uL   RBC 3.81 (L) 4.22 - 5.81 MIL/uL   Hemoglobin 12.2 (L) 13.0 - 17.0 g/dL   HCT 01.0 (L) 93.2 - 35.5 %   MCV 94.0 80.0 - 100.0 fL   MCH 32.0 26.0 - 34.0 pg   MCHC 34.1 30.0 - 36.0 g/dL   RDW 73.2 20.2 - 54.2 %   Platelets 140 (L) 150 - 400 K/uL   nRBC 0.0 0.0 - 0.2 %    I have Reviewed nursing notes, Vitals, and Lab results since pt's last encounter. Pertinent lab results : see above I have ordered test including BMP, CBC, Mg I have reviewed the last note from staff over past 24 hours I have discussed pt's care plan and test results with nursing staff, case manager  Time spent: Greater than 50% of the 55 minute visit was spent in counseling/coordination of care for the patient as laid out in the A&P.    LOS: 0 days   Lewie Chamber, MD [facility] 03/26/2022, 1:43 PM

## 2022-03-26 NOTE — ED Notes (Signed)
Pt assisted by this RN and Ginny, Medic to use urinal. Pt unable to urinate at this time. Pt placed back in bed and sat in upright position for breakfast tray.

## 2022-03-26 NOTE — Hospital Course (Signed)
Scott Cortez is an 88 yo male with PMH Parkinson's disease, A-fib, BPH, CHF, CAD, depression, GERD, HTN, HLD, osteoarthritis, RLS.  He presented to the hospital after feeling dizzy and falling at home.  He states this is a rare occasion for him and he does not usually fall. He did not have any recent changes to his medications prior to hospitalization. He was admitted for PT evaluation and further observation.

## 2022-03-26 NOTE — Evaluation (Signed)
Occupational Therapy Evaluation Patient Details Name: Scott Cortez MRN: [id number] DOB: 1933/11/21 Today's Date: 03/26/2022   History of Present Illness Pt is an 88 yo male with PMH Parkinson's disease, A-fib, BPH, CHF, CAD, depression, GERD, HTN, HLD, osteoarthritis, RLS.  He presented to the hospital after feeling dizzy and falling at home. MD assessment includes falls, HTN, chronic CHF, thrombocytopenia, and A-fib.   Clinical Impression   Patient seen for OT evaluation, significant other present. Pt presenting with decreased independence in self care, balance, functional mobility/transfers, endurance, and safety awareness. Pt reports living at home alone and being Mod I with use of RW PTA. Pt currently functioning at supervision-Min A for supine <> sit, Min A to stand from EOB, and Min A to take lateral steps toward [facility] using RW. Pt experiencing tachycardia with HR in the 140s with activity. Negative for orthostatics during session with BP 136/63. Pt endorsing 5/10 pain in R hip. Pt will benefit from acute OT to increase overall independence in the areas of ADLs and functional mobility in order to safely discharge to next venue of care. Upon hospital discharge, recommend STR to maximize pt safety and return to PLOF.      Recommendations for follow up therapy are one component of a multi-disciplinary discharge planning process, led by the attending physician.  Recommendations may be updated based on patient status, additional functional criteria and insurance authorization.   Follow Up Recommendations  Skilled nursing-short term rehab (<3 hours/day)     Assistance Recommended at Discharge Frequent or constant Supervision/Assistance  Patient can return home with the following A lot of help with bathing/dressing/bathroom;A lot of help with walking and/or transfers;Assistance with cooking/housework;Direct supervision/assist for medications management;Assist for transportation;Help with  stairs or ramp for entrance    Functional Status Assessment  Patient has had a recent decline in their functional status and demonstrates the ability to make significant improvements in function in a reasonable and predictable amount of time.  Equipment Recommendations  Other (comment) (defer to next venue of care)    Recommendations for Other Services       Precautions / Restrictions Precautions Precautions: Fall Restrictions Weight Bearing Restrictions: No      Mobility Bed Mobility Overal bed mobility: Needs Assistance Bed Mobility: Supine to Sit, Sit to Supine     Supine to sit: Min assist Sit to supine: Supervision   General bed mobility comments: Extensive time, effort, and use of bed rail along with multiple attempts to perform supine <> sit    Transfers Overall transfer level: Needs assistance Equipment used: Rolling walker (2 wheels) Transfers: Sit to/from Stand Sit to Stand: Min assist           General transfer comment: STS from EOB (3x)      Balance Overall balance assessment: History of Falls, Needs assistance Sitting-balance support: Feet supported, Bilateral upper extremity supported Sitting balance-Leahy Scale: Fair     Standing balance support: Bilateral upper extremity supported, During functional activity, Reliant on assistive device for balance Standing balance-Leahy Scale: Poor                             ADL either performed or assessed with clinical judgement   ADL Overall ADL's : Needs assistance/impaired  Functional mobility during ADLs: Minimal assistance;Rolling walker (2 wheels) (assist for RW managment, difficulty lifting B feet off of floor, attempting to slide feet to take lateral steps toward [facility])       Vision Baseline Vision/History: 1 Wears glasses Patient Visual Report: No change from baseline       Perception     Praxis      Pertinent Vitals/Pain  Pain Assessment Pain Assessment: 0-10 Pain Score: 5  Pain Location: R hip Pain Descriptors / Indicators: Sore Pain Intervention(s): Premedicated before session, Monitored during session, Repositioned     Hand Dominance     Extremity/Trunk Assessment Upper Extremity Assessment Upper Extremity Assessment: Generalized weakness   Lower Extremity Assessment Lower Extremity Assessment: Generalized weakness       Communication Communication Communication: HOH   Cognition Arousal/Alertness: Awake/alert Behavior During Therapy: WFL for tasks assessed/performed Overall Cognitive Status: Within Functional Limits for tasks assessed                                 General Comments: Has certain way of doing things, did not want therapist talking during mobility in order for him to focus (needs significant time and quiet to process after a command is given), increased time and VC to follow multi step commands     General Comments       Exercises Other Exercises Other Exercises: OT provided education re: role of OT, OT POC, post acute recs, sitting up for all meals, EOB/OOB mobility with assistance, home/fall safety.     Shoulder Instructions      Home Living Family/patient expects to be discharged to:: Private residence Living Arrangements: Alone Available Help at Discharge: Family;Friend(s);Available PRN/intermittently Type of Home: House Home Access: Stairs to enter [employer] of Steps: 5 Entrance Stairs-Rails: Right Home Layout: One level     Bathroom Shower/Tub: Producer, television/film/video: Handicapped height     Home Equipment: Agricultural consultant (2 wheels)          Prior Functioning/Environment Prior Level of Function : Independent/Modified Independent             Mobility Comments: Mod Ind amb household distances only with a RW, 3 falls secondary to LOB in last 6 months, uses facility w/c for MD apts and a facility electric  scooter/cart at the grocery store ADLs Comments: Pt does not drive. Pt orders food and pt's girlfriends picks it up for him. Pt has a very specific way of doing things at home.        OT Problem List: Decreased strength;Decreased activity tolerance;Impaired balance (sitting and/or standing);Pain;Decreased knowledge of use of DME or AE;Decreased safety awareness      OT Treatment/Interventions: Self-care/ADL training;Therapeutic exercise;Therapeutic activities;DME and/or AE instruction;Patient/family education;Balance training    OT Goals(Current goals can be found in the care plan section) Acute Rehab OT Goals Patient Stated Goal: to be able to walk, get stronger OT Goal Formulation: With patient Time For Goal Achievement: 04/09/22 Potential to Achieve Goals: Fair   OT Frequency: Min 2X/week    Co-evaluation              AM-PAC OT "6 Clicks" Daily Activity     Outcome Measure Help from another person eating meals?: None Help from another person taking care of personal grooming?: A Little Help from another person toileting, which includes using toliet, bedpan, or urinal?: A Lot Help from another person bathing (including washing,  rinsing, drying)?: A Lot Help from another person to put on and taking off regular upper body clothing?: A Little Help from another person to put on and taking off regular lower body clothing?: A Lot 6 Click Score: 16   End of Session Equipment Utilized During Treatment: Rolling walker (2 wheels);Gait belt Nurse Communication: Mobility status;Other (comment) (tachycardia)  Activity Tolerance: Patient tolerated treatment well;Patient limited by fatigue Patient left: in bed;with call bell/phone within reach;with family/visitor present  OT Visit Diagnosis: Unsteadiness on feet (R26.81);History of falling (Z91.81);Muscle weakness (generalized) (M62.81);Pain Pain - Right/Left: Right Pain - part of body: Hip                Time: 0947-0962 OT Time  Calculation (min): 28 min Charges:  OT General Charges $OT Visit: 1 Visit OT Evaluation $OT Eval Moderate Complexity: 1 Mod  [facility] MS, OTR/L ascom [phone]  03/26/22, 3:26 PM

## 2022-03-26 NOTE — Assessment & Plan Note (Signed)
-   Continue current regimen -Negative orthostatic blood pressure

## 2022-03-27 DIAGNOSIS — R42 Dizziness and giddiness: Secondary | ICD-10-CM

## 2022-03-27 DIAGNOSIS — G20A1 Parkinson's disease without dyskinesia, without mention of fluctuations: Secondary | ICD-10-CM | POA: Insufficient documentation

## 2022-03-27 DIAGNOSIS — W19XXXA Unspecified fall, initial encounter: Secondary | ICD-10-CM | POA: Diagnosis not present

## 2022-03-27 DIAGNOSIS — I4891 Unspecified atrial fibrillation: Secondary | ICD-10-CM | POA: Diagnosis not present

## 2022-03-27 NOTE — Progress Notes (Addendum)
Progress Note    Scott Cortez   ZOX:[id number]  DOB: Oct 28, 1933  DOA: 03/25/2022     1 PCP: [facility]  Initial CC: fall at home  Hospital Course: Scott Cortez is an 88 yo male with PMH Parkinson's disease, A-fib, BPH, CHF, CAD, depression, GERD, HTN, HLD, osteoarthritis, RLS.  He presented to the hospital after feeling dizzy and falling at home.  He states this is a rare occasion for him and he does not usually fall. He did not have any recent changes to his medications prior to hospitalization. He was admitted for PT evaluation and further observation.  Interval History:  No events overnight.  He is describing feeling dizzy this morning especially when sitting up in bed or was attempting to go to the bathroom earlier.  Appears to be somewhat positional especially if orthostatics were negative yesterday. He is not against going to skilled nursing or considering something long-term but he is still a little apprehensive giving up his independence from being at home.  Assessment and Plan: * Falls - Differential includes medication side effects, autonomic dysfunction due to Parkinson's, orthostatic hypotension - negative orthostatic check on 11/13 with PT - PT recommending SNF -Long talk with patient.  There is some concern that he may not be able to continue living independently at home with the resources currently in place.  He plans to try and work further with physical therapy over the next 1 to 2 days and then further decide on SNF versus home  Dizziness - Wide differential.  Negative orthostatic blood pressure check on 03/26/2022.  Etiologies considered possibly from parkinson's vs medication side effects vs BPPV given he describes some positional changes precipitating  - will ask PT to eval Dix-Hallpike - continue supportive care for now - may need to repeat orthostatics if BP fluctuates further   Hypertension - Continue current regimen -Negative  orthostatic blood pressure  Parkinson disease - continue sinemet and requip  Chronic diastolic (congestive) heart failure (HCC) - Last echo 03/03/2019: EF 60 to 65%, impaired relaxation consistent with diastolic dysfunction -No S/S exacerbation   Atrial fibrillation (HCC) - continue coreg and eliquis  Thrombocytopenia (HCC) - Some history of transient thrombocytopenia - Continue trending for now  CAD (coronary artery disease) - Continue Coreg, losartan, atorvastatin, Eliquis.  BPH with obstruction/lower urinary tract symptoms - Continue Flomax   Old records reviewed in assessment of this patient  Antimicrobials:   DVT prophylaxis:   apixaban (ELIQUIS) tablet 5 mg   Code Status:   Code Status: DNR  Mobility Assessment (last 72 hours)     Mobility Assessment     Row Name 03/26/22 2230 03/26/22 1509 03/26/22 1443 03/26/22 0800     Does patient have an order for bedrest or is patient medically unstable No - Continue assessment -- -- No - Continue assessment    What is the highest level of mobility based on the progressive mobility assessment? Level 2 (Chairfast) - Balance while sitting on edge of bed and cannot stand Level 3 (Stands with assist) - Balance while standing  and cannot march in place Level 3 (Stands with assist) - Balance while standing  and cannot march in place Level 3 (Stands with assist) - Balance while standing  and cannot march in place    Is the above level different from baseline mobility prior to current illness? Yes - Recommend PT order -- -- --  Barriers to discharge:  Disposition Plan: SNF vs Home (declining HH) Status is: Inpt  Objective: Blood pressure 127/72, pulse 83, temperature 97.8 F (36.6 C), resp. rate 17, height 5\' 8"  (1.727 m), weight 73.8 kg, SpO2 97 %.  Examination:  Physical Exam Constitutional:      General: He is not in acute distress.    Appearance: Normal appearance.  HENT:     Head: Normocephalic and  atraumatic.     Mouth/Throat:     Mouth: Mucous membranes are moist.  Eyes:     Extraocular Movements: Extraocular movements intact.  Cardiovascular:     Rate and Rhythm: Normal rate. Rhythm irregular.     Heart sounds: Normal heart sounds.  Pulmonary:     Effort: Pulmonary effort is normal. No respiratory distress.     Breath sounds: Normal breath sounds. No wheezing.  Abdominal:     General: Bowel sounds are normal. There is no distension.     Palpations: Abdomen is soft.     Tenderness: There is no abdominal tenderness.  Musculoskeletal:        General: Normal range of motion.     Cervical back: Normal range of motion and neck supple.  Skin:    General: Skin is warm and dry.  Neurological:     General: No focal deficit present.     Mental Status: He is alert.  Psychiatric:        Mood and Affect: Mood normal.        Behavior: Behavior normal.      Consultants:    Procedures:    Data Reviewed: No results found for this or any previous visit (from the past 24 hour(s)).   I have Reviewed nursing notes, Vitals, and Lab results since pt's last encounter. Pertinent lab results : see above I have reviewed the last note from staff over past 24 hours I have discussed pt's care plan and test results with nursing staff, case manager     LOS: 1 day   , MD [facility] 03/27/2022, 12:09 PM

## 2022-03-27 NOTE — Progress Notes (Signed)
Physical Therapy Treatment Patient Details Name: Scott Cortez MRN: [id number] DOB: 06-26-1933 Today's Date: 03/27/2022   History of Present Illness Pt is an 88 yo male with PMH Parkinson's disease, A-fib, BPH, CHF, CAD, depression, GERD, HTN, HLD, osteoarthritis, RLS.  He presented to the hospital after feeling dizzy and falling at home. MD assessment includes falls, HTN, chronic CHF, thrombocytopenia, and A-fib.    PT Comments    Pt awake and alert with eyes open and tracking well but very slow to respond to verbal cues/commands at onset of session.  With extensive cuing pt able to state his name but offered little to no additional verbal communication for the first half of the session.  With bed mobility training and supine therex pt began to communicate verbally but significantly less so than during the prior session 03/26/22, nsg and MD notified. Pt able to participate with below therex but required extensive verbal and tactile cuing to do so. Transfer/gait training as well as MD requested assessment for possible BPPV deferred at this time secondary to pt's level of alertness and poor ability to follow commands.  Pt will benefit from PT services in a SNF setting upon discharge to safely address deficits listed in patient problem list for decreased caregiver assistance and eventual return to PLOF.   Recommendations for follow up therapy are one component of a multi-disciplinary discharge planning process, led by the attending physician.  Recommendations may be updated based on patient status, additional functional criteria and insurance authorization.  Follow Up Recommendations  Skilled nursing-short term rehab (<3 hours/day) Can patient physically be transported by private vehicle: No   Assistance Recommended at Discharge Frequent or constant Supervision/Assistance  Patient can return home with the following A lot of help with walking and/or transfers;A lot of help with  bathing/dressing/bathroom;Assistance with cooking/housework;Direct supervision/assist for medications management;Assist for transportation;Help with stairs or ramp for entrance   Equipment Recommendations  None recommended by PT    Recommendations for Other Services       Precautions / Restrictions Precautions Precautions: Fall Restrictions Weight Bearing Restrictions: No     Mobility  Bed Mobility Overal bed mobility: Needs Assistance Bed Mobility: Rolling Rolling: Min assist         General bed mobility comments: Max multi-modal cuing for sequencing and min A with rolling left/right    Transfers                   General transfer comment: deferred secondary to cognitive status    Ambulation/Gait                   Stairs             Wheelchair Mobility    Modified Rankin (Stroke Patients Only)       Balance                                            Cognition Arousal/Alertness: Awake/alert Behavior During Therapy: Flat affect Overall Cognitive Status: Impaired/Different from baseline Area of Impairment: Orientation, Attention, Following commands                 Orientation Level: Person Current Attention Level: Alternating   Following Commands: Follows one step commands inconsistently                Exercises Total Joint Exercises Ankle Circles/Pumps: AROM, AAROM,  Strengthening, Both, 5 reps, 10 reps Quad Sets: Strengthening, Both, 5 reps, 10 reps Heel Slides: AAROM, Strengthening, Both, 10 reps Hip ABduction/ADduction: AAROM, Strengthening, Both, 10 reps Straight Leg Raises: AAROM, Both, Strengthening, 10 reps    General Comments        Pertinent Vitals/Pain Pain Assessment Pain Assessment: No/denies pain    Home Living                          Prior Function            PT Goals (current goals can now be found in the care plan section) Progress towards PT goals: PT to  reassess next treatment    Frequency    Min 2X/week      PT Plan Current plan remains appropriate    Co-evaluation              AM-PAC PT "6 Clicks" Mobility   Outcome Measure  Help needed turning from your back to your side while in a flat bed without using bedrails?: A Little Help needed moving from lying on your back to sitting on the side of a flat bed without using bedrails?: A Lot Help needed moving to and from a bed to a chair (including a wheelchair)?: A Lot Help needed standing up from a chair using your arms (e.g., wheelchair or bedside chair)?: A Lot Help needed to walk in hospital room?: Total Help needed climbing 3-5 steps with a railing? : Total 6 Click Score: 11    End of Session   Activity Tolerance: Patient limited by lethargy Patient left: in bed;with call bell/phone within reach;with bed alarm set;with family/visitor present Nurse Communication: Mobility status; nsg and MD notified of decline in level of alertness  PT Visit Diagnosis: Unsteadiness on feet (R26.81);History of falling (Z91.81);Difficulty in walking, not elsewhere classified (R26.2);Muscle weakness (generalized) (M62.81);Pain Pain - Right/Left: Right Pain - part of body: Hip     Time: 6063-0160 PT Time Calculation (min) (ACUTE ONLY): 29 min  Charges:  $Therapeutic Exercise: 8-22 mins $Therapeutic Activity: 8-22 mins                    D. Scott Jerome Otter PT, DPT 03/27/22, 5:25 PM

## 2022-03-27 NOTE — Plan of Care (Signed)
  Problem: Clinical Measurements: Goal: Will remain free from infection Outcome: Progressing   Problem: Pain Managment: Goal: General experience of comfort will improve Outcome: Progressing   Problem: Safety: Goal: Ability to remain free from injury will improve Outcome: Progressing   Problem: Skin Integrity: Goal: Risk for impaired skin integrity will decrease Outcome: Progressing   

## 2022-03-27 NOTE — Assessment & Plan Note (Signed)
-   Wide differential.  Negative orthostatic blood pressure check on 03/26/2022.  Etiologies considered possibly from parkinson's vs medication side effects vs BPPV given he describes some positional changes precipitating  - will ask PT to eval Dix-Hallpike - continue supportive care for now - may need to repeat orthostatics if BP fluctuates further

## 2022-03-27 NOTE — Assessment & Plan Note (Signed)
-   continue sinemet and requip

## 2022-03-28 DIAGNOSIS — G20A1 Parkinson's disease without dyskinesia, without mention of fluctuations: Secondary | ICD-10-CM

## 2022-03-28 DIAGNOSIS — I10 Essential (primary) hypertension: Secondary | ICD-10-CM | POA: Diagnosis not present

## 2022-03-28 DIAGNOSIS — R5381 Other malaise: Secondary | ICD-10-CM

## 2022-03-28 DIAGNOSIS — I5032 Chronic diastolic (congestive) heart failure: Secondary | ICD-10-CM

## 2022-03-28 DIAGNOSIS — W19XXXD Unspecified fall, subsequent encounter: Secondary | ICD-10-CM

## 2022-03-28 DIAGNOSIS — R42 Dizziness and giddiness: Secondary | ICD-10-CM | POA: Diagnosis not present

## 2022-03-28 MED ORDER — ADULT MULTIVITAMIN W/MINERALS CH
1.0000 | ORAL_TABLET | Freq: Every day | ORAL | Status: DC
  Administered 2022-03-29 – 2022-03-30 (×2): 1 via ORAL
  Filled 2022-03-28 (×2): qty 1

## 2022-03-28 MED ORDER — ENSURE ENLIVE PO LIQD
237.0000 mL | Freq: Three times a day (TID) | ORAL | Status: DC
  Administered 2022-03-28 – 2022-03-30 (×7): 237 mL via ORAL

## 2022-03-28 MED ORDER — PANTOPRAZOLE SODIUM 40 MG PO TBEC
40.0000 mg | DELAYED_RELEASE_TABLET | Freq: Every day | ORAL | Status: DC
  Administered 2022-03-28 – 2022-03-30 (×3): 40 mg via ORAL
  Filled 2022-03-28 (×3): qty 1

## 2022-03-28 NOTE — Progress Notes (Signed)
[facility]                                                                               Scott Cortez, is a 88 y.o. male, DOB - [birth year], ZDG:[id number] Admit date - 03/25/2022    Outpatient Primary MD for the patient is [facility]  LOS - 2  days    Brief summary   Scott Cortez is an 88 yo male with PMH Parkinson's disease, A-fib, BPH, CHF, CAD, depression, GERD, HTN, HLD, osteoarthritis, RLS.  He presented to the hospital after feeling dizzy and falling at home.  He states this is a rare occasion for him and he does not usually fall. He did not have any recent changes to his medications prior to hospitalization. He was admitted for PT evaluation and further observation.   Assessment & Plan    Assessment and Plan: * Falls - Differential includes medication side effects, autonomic dysfunction due to Parkinson's, orthostatic hypotension - negative orthostatic check on 11/13 with PT - PT recommending SNF - patient is agreeable.   Dizziness - Wide differential.  Negative orthostatic blood pressure check on 03/26/2022.  Etiologies considered possibly from parkinson's vs medication side effects vs BPPV given he describes some positional changes precipitating  - will ask PT to eval Dix-Hallpike - no dizziness today.   Hypertension Well controlled.   Parkinson disease - continue sinemet and requip  Chronic diastolic (congestive) heart failure (HCC) - Last echo 03/03/2019: EF 60 to 65%, impaired relaxation consistent with diastolic dysfunction -No S/S exacerbation   Atrial fibrillation (HCC) - continue coreg and eliquis  Thrombocytopenia (HCC) - Some history of transient thrombocytopenia - Continue trending for now  CAD (coronary artery disease) - Continue Coreg, losartan, atorvastatin, Eliquis.  BPH with obstruction/lower urinary tract symptoms - Continue Flomax  Cough and phlegm in the morning.  SLP eval   PPI for possible GERD.       Estimated body mass index is 24.1 kg/m as calculated from the following:   Height as of this encounter: 5\' 8"  (1.727 m).   Weight as of this encounter: 71.9 kg.  Code Status: DNR DVT Prophylaxis:   apixaban (ELIQUIS) tablet 5 mg   Level of Care: Level of care: Telemetry Medical Family Communication: none at bedside.   Disposition Plan:     Remains inpatient appropriate: SNF placement.   Procedures:  None.   Consultants:   None.   Antimicrobials:   Anti-infectives (From admission, onward)    None        Medications  Scheduled Meds:  apixaban  5 mg Oral BID   atorvastatin  20 mg Oral Daily   carbidopa-levodopa  1 tablet Oral QHS   carbidopa-levodopa  2 tablet Oral QID   carvedilol  6.25 mg Oral BID WC   losartan  25 mg Oral Q1500   pantoprazole  40 mg Oral Daily   rOPINIRole  0.25 mg Oral QHS   sodium chloride flush  3 mL Intravenous Q12H   tamsulosin  0.4 mg Oral Daily   Continuous Infusions: PRN Meds:.acetaminophen **OR** acetaminophen, oxyCODONE-acetaminophen    Subjective:   Scott Cortez was seen and examined  today.  Appears comfortable, some phlegm in the morning.    Objective:   Vitals:   03/27/22 1943 03/27/22 2324 03/28/22 0532 03/28/22 0827  BP: (!) 124/91 97/71 (!) 99/59 124/74  Pulse: 91 (!) 52 (!) 55 75  Resp: 18 18 20 18   Temp: 97.9 F (36.6 C) 97.9 F (36.6 C) 98.4 F (36.9 C) 98 F (36.7 C)  TempSrc: Oral Oral Oral Oral  SpO2: 97% 96%  96%  Weight:   71.9 kg   Height:        Intake/Output Summary (Last 24 hours) at 03/28/2022 1053 Last data filed at 03/27/2022 1943 Gross per 24 hour  Intake 240 ml  Output 800 ml  Net -560 ml   Filed Weights   03/26/22 1400 03/26/22 2217 03/28/22 0532  Weight: 70.5 kg 73.8 kg 71.9 kg     Exam General: Alert and oriented x 3, NAD Cardiovascular: S1 S2 auscultated, no murmurs, RRR Respiratory: Clear to auscultation bilaterally, no wheezing, rales or rhonchi Gastrointestinal:  Soft, nontender, nondistended, + bowel sounds Ext: no pedal edema bilaterally Neuro: AAOx3, Cr N's II- XII. Strength 5/5 upper and lower extremities bilaterally Skin: No rashes Psych: Normal affect and demeanor, alert and oriented x3    Data Reviewed:  I have personally reviewed following labs and imaging studies   CBC Lab Results  Component Value Date   WBC 4.9 03/26/2022   RBC 3.81 (L) 03/26/2022   HGB 12.2 (L) 03/26/2022   HCT 35.8 (L) 03/26/2022   MCV 94.0 03/26/2022   MCH 32.0 03/26/2022   PLT 140 (L) 03/26/2022   MCHC 34.1 03/26/2022   RDW 12.7 03/26/2022   LYMPHSABS 0.8 05/17/2021   MONOABS 0.5 05/17/2021   EOSABS 0.1 05/17/2021   BASOSABS 0.0 05/17/2021     Last metabolic panel Lab Results  Component Value Date   NA 135 03/26/2022   K 3.9 03/26/2022   CL 102 03/26/2022   CO2 27 03/26/2022   BUN 18 03/26/2022   CREATININE 0.71 03/26/2022   GLUCOSE 95 03/26/2022   GFRNONAA >60 03/26/2022   GFRAA >60 12/18/2019   CALCIUM 9.0 03/26/2022   PROT 5.9 (L) 03/26/2022   ALBUMIN 3.4 (L) 03/26/2022   LABGLOB 2.5 03/10/2019   AGRATIO 1.4 03/10/2019   BILITOT 2.7 (H) 03/26/2022   ALKPHOS 74 03/26/2022   AST 21 03/26/2022   ALT <5 03/26/2022   ANIONGAP 6 03/26/2022    CBG (last 3)  No results for input(s): "GLUCAP" in the last 72 hours.    Coagulation Profile: No results for input(s): "INR", "PROTIME" in the last 168 hours.   Radiology Studies: No results found.     03/28/2022 M.D. [facility] 03/28/2022, 10:53 AM  Available via Epic secure chat 7am-7pm After 7 pm, please refer to night coverage provider listed on amion.

## 2022-03-28 NOTE — Progress Notes (Signed)
Nutrition Brief Note  RD seen per request of SLP.   Per discussion with SLP, pt follows a very regimented diet and is frustrated over lack of food preferences. She is requesting RD assistance with food preferences.   Spoke with pt at bedside, who reports he follows a vegetarian diet at home. Pt consumes fish, cheese, egg, and dairy products. Pt shares he usually consumes cereal at breakfast (cocoa puffs of special K) and was frustrated that he does not like the cereal options here at the hospital. Reviewed menu with pt; personally ordered lunch, dinner, and breakfast for pt. Also alerted patient Microbiologist. Pt suspect he will discharge home tomorrow.   Current diet order is regular, patient is consuming approximately 100% of meals at this time. Labs and medications reviewed.   No nutrition interventions warranted at this time. If nutrition issues arise, please consult RD.   Levada Schilling, RD, LDN, CDCES Registered Dietitian II Certified Diabetes Care and Education Specialist Please refer to [facility] for RD and/or RD on-call/weekend/after hours pager

## 2022-03-28 NOTE — Progress Notes (Signed)
Physical Therapy Treatment Patient Details Name: Scott Cortez MRN: [id number] DOB: 1933/11/27 Today's Date: 03/28/2022   History of Present Illness Pt is an 88 yo male with PMH Parkinson's disease, A-fib, BPH, CHF, CAD, depression, GERD, HTN, HLD, osteoarthritis, RLS.  He presented to the hospital after feeling dizzy and falling at home. MD assessment includes falls, HTN, chronic CHF, thrombocytopenia, and A-fib.    PT Comments    Pt was pleasant and motivated to participate during the session and put forth good effort throughout. Of note pt much more alert and conversational compared to prior session.  Per MD request pt assessed for possible BPPV this session.  Pt taken through dix hallpike maneuver to both the left and right with both negative for eliciting symptoms or nystagmus.  Pt required min physical assistance with transfers and gait per below and remains appropriate for PT services in a SNF setting upon discharge to safely address deficits listed in patient problem list for decreased caregiver assistance and eventual return to PLOF.     Recommendations for follow up therapy are one component of a multi-disciplinary discharge planning process, led by the attending physician.  Recommendations may be updated based on patient status, additional functional criteria and insurance authorization.  Follow Up Recommendations  Skilled nursing-short term rehab (<3 hours/day) Can patient physically be transported by private vehicle: No   Assistance Recommended at Discharge Frequent or constant Supervision/Assistance  Patient can return home with the following A lot of help with walking and/or transfers;A lot of help with bathing/dressing/bathroom;Assistance with cooking/housework;Direct supervision/assist for medications management;Assist for transportation;Help with stairs or ramp for entrance   Equipment Recommendations       Recommendations for Other Services       Precautions  / Restrictions Precautions Precautions: Fall Restrictions Weight Bearing Restrictions: No     Mobility  Bed Mobility Overal bed mobility: Needs Assistance Bed Mobility: Supine to Sit, Sit to Supine     Supine to sit: Min assist Sit to supine: Min assist   General bed mobility comments: increased time, effort, use of rail, and min A for BLE and trunk control required    Transfers Overall transfer level: Needs assistance Equipment used: Rolling walker (2 wheels) Transfers: Sit to/from Stand Sit to Stand: Min assist, Min guard           General transfer comment: Min verbal cues for foot and hand positioning and min A to come to full upright standing    Ambulation/Gait Ambulation/Gait assistance: Min assist Gait Distance (Feet): 2 Feet Assistive device: Rolling walker (2 wheels) Gait Pattern/deviations: Decreased step length - right, Decreased step length - left, Trunk flexed, Shuffle, Step-to pattern Gait velocity: decreased     General Gait Details: Very slow, effortful cadence with min A for stability and mod to max verbal cues for safe use of the RW   Stairs             Wheelchair Mobility    Modified Rankin (Stroke Patients Only)       Balance Overall balance assessment: History of Falls, Needs assistance   Sitting balance-Leahy Scale: Good     Standing balance support: Bilateral upper extremity supported, During functional activity Standing balance-Leahy Scale: Poor                              Cognition Arousal/Alertness: Awake/alert Behavior During Therapy: WFL for tasks assessed/performed Overall Cognitive Status: Within Functional Limits for tasks  assessed                                          Exercises Other Exercises Other Exercises: dix hallpike maneuver to R and L    General Comments General comments (skin integrity, edema, etc.): negative for orthostatics during session, no c/o dizziness, SpO2  93% on RA, HR 70s-80s in supine      Pertinent Vitals/Pain Pain Assessment Pain Assessment: No/denies pain    Home Living                          Prior Function            PT Goals (current goals can now be found in the care plan section) Progress towards PT goals: PT to reassess next treatment    Frequency    Min 2X/week      PT Plan Current plan remains appropriate    Co-evaluation              AM-PAC PT "6 Clicks" Mobility   Outcome Measure  Help needed turning from your back to your side while in a flat bed without using bedrails?: A Little Help needed moving from lying on your back to sitting on the side of a flat bed without using bedrails?: A Little Help needed moving to and from a bed to a chair (including a wheelchair)?: A Little Help needed standing up from a chair using your arms (e.g., wheelchair or bedside chair)?: A Little Help needed to walk in hospital room?: Total Help needed climbing 3-5 steps with a railing? : Total 6 Click Score: 14    End of Session Equipment Utilized During Treatment: Gait belt Activity Tolerance: Patient tolerated treatment well Patient left: in bed;with call bell/phone within reach;with bed alarm set Nurse Communication: Mobility status PT Visit Diagnosis: Unsteadiness on feet (R26.81);History of falling (Z91.81);Difficulty in walking, not elsewhere classified (R26.2);Muscle weakness (generalized) (M62.81);Pain Pain - Right/Left: Right Pain - part of body: Hip     Time: 9021-1155 PT Time Calculation (min) (ACUTE ONLY): 30 min  Charges:  $Therapeutic Activity: 23-37 mins                     D. Scott Mattye Verdone PT, DPT 03/28/22, 3:20 PM

## 2022-03-28 NOTE — Evaluation (Signed)
Clinical/Bedside Swallow Evaluation Patient Details  Name: Scott Cortez MRN: [id number] Date of Birth: 06/30/1933  Today's Date: 03/28/2022 Time: SLP Start Time (ACUTE ONLY): 0830 SLP Stop Time (ACUTE ONLY): 0930 SLP Time Calculation (min) (ACUTE ONLY): 60 min  Past Medical History:  Past Medical History:  Diagnosis Date   AAA (abdominal aortic aneurysm) (HCC)    a.) CTA 12/31/2015: measured 3.1 cm. b.) CTA 03/07/2019: measured 3.5 cm. c.) CT abdomen 05/05/2021: measured  2.8 cm. d.) CTA 05/17/2021: measured 3.2 cm   Acute MI, inferior wall (HCC) 11/19/2003   a.) LHC 11/22/2003: EF 49%; 20% pRCA, 90% mRCA, 20% LM, 20% pLCx, 20% mLAD, 20% D1; unable to perform PCI --> transferred to [facility] and stent (unknown type) was placed to the RCA.   Aortic atherosclerosis (HCC)    Atrial fibrillation (HCC)    a.) CHA2DS2-VASc = 5 (age x 2, CHF, HTN, previous MI/aortic plaque). b.) rate/hythm maintained on oral carvedilol; chronically anticoagulated using apixaban.   Bladder neck obstruction    BPH (benign prostatic hyperplasia)    Bradycardia    Cardiac murmur    Cardiomyopathy (HCC)    CHF (congestive heart failure) (HCC)    a.) TTE 01/05/2014: EF 50%; mild LA enlargement; triv AR//PR, mild MR/TR; G1DD. b.) TTE 03/08/2019: EF 60-65%; RV mildly enlarged. c.) TTE 09/26/2020: EF 30%; mild LVH; mild LA enlargement; mild MR/TR/PR.   Chronic pancreatitis (HCC)    Coronary artery disease    a.) inferior wall MI 11/19/2003; LHC 11/22/2003: EF 49%; 20% pRCA, 90% mRCA, 20% LM, 20% pLCx, 20% mLAD, 20% D1; unable to perform PCI --> transferred to [facility] and stent (unknown type) was placed to the RCA. b.) MI 2007/2008 (date unknown); PCI placing a second stent (unknown type) to RCA. c.) LHC 02/02/2009: 50% RPLS; intervention deferred opting for med mgmt.   Current use of long term anticoagulation    a.) apixaban   Dental bridge present    upper - bilateral   Depression    Frequent falls    GERD  (gastroesophageal reflux disease)    Glaucoma    Hyperlipidemia    Hypertension    Iliac artery aneurysm, left (HCC)    a.)  CTA 05/17/2021: measured 1.7 cm; previously 1.5 cm in 2015.   MI (myocardial infarction) (HCC) 05/02/2007   a.) inferoposterolateral MI 05/02/2007 - PCI placing a stent (unknown type) to the RCA   Neuropathy of both feet    Osteoarthritis    Parkinson's disease    RLS (restless legs syndrome)    a.) takes ropinirole   Past Surgical History:  Past Surgical History:  Procedure Laterality Date   CATARACT EXTRACTION W/PHACO Left 02/02/2019   Procedure: CATARACT EXTRACTION PHACO AND INTRAOCULAR LENS PLACEMENT (IOC) LEFT ISTENT INJ AND ISTENT 01:09.1        11.3%          8.47;  Surgeon: Nevada Crane, MD;  Location: [facility];  Service: Ophthalmology;  Laterality: Left;   CATARACT EXTRACTION W/PHACO Right 02/23/2019   Procedure: CATARACT EXTRACTION PHACO AND INTRAOCULAR LENS PLACEMENT (IOC) RIGHT ISTENT INJ 01:09.1  14.1%  9.78;  Surgeon: Nevada Crane, MD;  Location: [facility];  Service: Ophthalmology;  Laterality: Right;   COLONOSCOPY N/A 10/15/2014   Procedure: COLONOSCOPY;  Surgeon: Scot Jun, MD;  Location: [facility] ENDOSCOPY;  Service: Endoscopy;  Laterality: N/A;   CORONARY ANGIOPLASTY WITH STENT PLACEMENT Left 11/16/2003   PCI and stent placement to the RCA (unknown  type)   CORONARY ANGIOPLASTY WITH STENT PLACEMENT Left 05/02/2007   PCI with stenting of the RCA (unknown type)   ESOPHAGOGASTRODUODENOSCOPY (EGD) WITH PROPOFOL N/A 10/15/2014   Procedure: ESOPHAGOGASTRODUODENOSCOPY (EGD) WITH PROPOFOL;  Surgeon: Scot Junobert T Elliott, MD;  [facility];  Service: Endoscopy;  Laterality: N/A;   INSERTION OF ANTERIOR SEGMENT AQUEOUS DRAINAGE DEVICE (ISTENT) Left 02/02/2019   Procedure: INSERTION OF ANTERIOR SEGMENT AQUEOUS DRAINAGE DEVICE (ISTENT);  Surgeon: Nevada CraneKing, Bradley Mark, MD;  [facility];  Service:  Ophthalmology;  Laterality: Left;   LEFT HEART CATH AND CORONARY ANGIOGRAPHY Left 02/02/2009   Procedure: LEFT HEART CATH AND CORONARY ANGIOGRAPHY; Location: [facility]; Surgeon: Rudean Hittwyane Callwood, MD   SAVORY DILATION N/A 10/15/2014   Procedure: Gaspar BiddingSAVORY DILATION;  Surgeon: Scot Junobert T Elliott, MD;  [facility];  Service: Endoscopy;  Laterality: N/A;   XI ROBOTIC ASSISTED INGUINAL HERNIA REPAIR WITH MESH  07/27/2021   Procedure: XI ROBOTIC ASSISTED INGUINAL HERNIA REPAIR WITH MESH;  Surgeon: Sung AmabileSakai, Isami, DO;  Location: [facility] ORS;  Service: General;;   HPI:  Pt is an 88 yo male with PMH Parkinson's disease, A-fib, BPH, CHF, CAD, depression, GERD, HTN, HLD, osteoarthritis, RLS.  He presented to the hospital after feeling dizzy and falling at home - noted frequent falls in chart history. MD assessment includes falls, HTN, chronic CHF, thrombocytopenia, and A-fib.  Head CT: No evidence of acute intracranial abnormality.  Last CXR was in 3/23 indicating emyphsema.  Pt indicated a Baseline of MOD Phlegm in the mornings and w/ meals; in absence of oral intake also.    Assessment / Plan / Recommendation  Clinical Impression    Pt seen for BSE this morning. Pt A/O x4; engaged easily and was talkative. Followed instructions w/ cue. Pt endorsed specific diet preferences. Dietician consulted to address further.   On RA; afebrile, WBC not elevated. Noted pt does not have a CXR this admit; Lungs are CTA per MD notes. Last CXR was in 07/2021 which revealed emphysema.     OF NOTE: Pt endorse s/s of REFLUX at home and specifically describes MOD amounts of Phlegm he expectorates at various times of the day -- in absence of oral intake also. He is not on a PPI at baseline.  Pt does have baseline of Parkinson's Dis and his medication regimen is carefully monitored. Pt follows w/ his Neurologist.    Pt appears to present w/ inconsistent s/s of pharyngeal phase dysphagia w/ suspected Neuromuscular impact secondary to  Parkinson's Dis at baseline; also noted is the s/s of Reflux and MOD amounts of Phlegm daily. These issue can impact impact Timing of pharyngeal swallow initiation thus increase risk for aspiration/aspiration pneumonia. Pt denies any Pulmonary decline nor tx for respiratory issues. He has not had any CXRs since 07/2021 nor tx for pneumonia per chart notes. No oral phase deficits noted.  Pt appears at reduced risk for aspiration when following general aspiration precautions and monitoring drinking of thin liquids.  It is recommended that pt/MD address potential Reflux s/s and the increased expectoration of Phlegm. ANY Dysmotility or Regurgitation of Reflux material can increase risk for aspiration of the Reflux material during Retrograde flow thus impact Pulmonary status.     Pt sat upright in bed w/ min support then consumed trials of ice chips, thin and Nectar liquids Via Cup, purees, and soft solid foods. Intermittent Coughing w/ thins occurred but this was inconsistent; ~5x w/ ~5-6 ozs during oral intake overall. Most often pt demonstrated a delayed  throat clear/cough. No other immediate, overt clinical s/s of aspiration noted w/ the trials given; clear vocal quality b/t trials, no decline in pulmonary status, no multiple swallows noted post initial pharyngeal swallow, no decline in O2 sats(96%). Oral phase appeared [facility] for bolus management, mastication of boluses, and timely A-P transfer/clearing of material.  OM exam was [facility] for oral clearing; lingual/labial movements. No unilateral weakness. Speech clear.  OF NOTE: aspiration precautions utilized w/ thin liquids (single, small sips) but this did not appear to impact the occurrence of the throat clearing/cough. Pt stated he was a "fast" drinker, so when allowed to do so, he consumed ~3 ozs of liquids at one time w/ no overt coughing.  He was given 4 ozs of Nectar liquids and reported it was "easy" to swallow; no overt coughing occurred w/ the Nectar  liquids.   After discussion w/ MD and pt, recommend continue a Regular diet (moistened foods) w/ thin liquids via Cup. General aspiration precautions. REFLUX precautions strongly recommended to lessen chance for Regurgitation -- HOB elevated at night when sleeping. Reduce distractions during drinking/eating; drink more slowly w/ focus on the swallowing.   MD has initiated a PPI for pt -- this was discussed w/ pt also.  ALL Pills in PUREE for safer swallowing.  STRONGLY RECOMMEND MONITORING OF PT'S PULMONARY STATUS W/ PT'S PCP AT HOME AND RE-ADDRESS PHARYNGEAL SWALLOWING OF THIN LIQUIDS IF DECLINE IN STATUS AND INCREASED COUGHING OCCURRING. No change in diet at this time in setting of NO pulmonary decline ongoing. MD agreed.   Recommend pt f/u w/ GI for assessment/management of Reflux and tx as indicated. Discussion and handouts given on aspiration precautions and REFLUX, impact of REFLUX on swallowing, PPI. MD to reconsult ST services if any new needs while admitted. NSG updated. Pt appreciative of Education information. SLP Visit Diagnosis: Dysphagia, pharyngeal phase (R13.13);Dysphagia, pharyngoesophageal phase (R13.14) (suspect Reflux; baseline Parkinson's Dis.)    Aspiration Risk  Mild aspiration risk;Risk for inadequate nutrition/hydration    Diet Recommendation   continue a Regular diet (moistened foods) w/ thin liquids via Cup. General aspiration precautions. REFLUX precautions strongly recommended to lessen chance for Regurgitation -- HOB elevated at night when sleeping. Reduce distractions during drinking/eating; drink more slowly w/ focus on the swallowing. NO Straws. Medication Administration: Whole meds with puree (for safer swallowing)    Other  Recommendations Recommended Consults: Consider GI evaluation (PPI; Dietician f/u) Oral Care Recommendations: Oral care BID;Oral care before and after PO;Patient independent with oral care (setup) Other Recommendations:  (not at this time)     Recommendations for follow up therapy are one component of a multi-disciplinary discharge planning process, led by the attending physician.  Recommendations may be updated based on patient status, additional functional criteria and insurance authorization.  Follow up Recommendations Follow physician's recommendations for discharge plan and follow up therapies      Assistance Recommended at Discharge  Support w/ tray setup and positioning as needed.  Functional Status Assessment Patient has had a recent decline in their functional status and/or demonstrates limited ability to make significant improvements in function in a reasonable and predictable amount of time  Frequency and Duration min 1 x/week  1 week       Prognosis Prognosis for Safe Diet Advancement: Fair (-Good) Barriers to Reach Goals: Time post onset;Severity of deficits Barriers/Prognosis Comment: suspect Reflux; baseline Parkinson's Dis.      Swallow Study   General Date of Onset: 03/25/22 HPI: Pt is an 88 yo male with  PMH Parkinson's disease, A-fib, BPH, CHF, CAD, depression, GERD, HTN, HLD, osteoarthritis, RLS.  He presented to the hospital after feeling dizzy and falling at home - not typical for pt per his report. MD assessment includes falls, HTN, chronic CHF, thrombocytopenia, and A-fib.  Head CT: No evidence of acute intracranial abnormality.  Last CXR was in 3/23 indicating emyphsema.  Pt indicated a Baseline of MOD Phlegm in the mornings and w/ meals; in absence of oral intake also. Type of Study: Bedside Swallow Evaluation Previous Swallow Assessment: none Diet Prior to this Study: Regular;Thin liquids Temperature Spikes Noted: No (wbc 4.9) Respiratory Status: Room air History of Recent Intubation: No Behavior/Cognition: Alert;Cooperative;Pleasant mood Oral Cavity Assessment: Within Functional Limits Oral Care Completed by SLP: Yes Oral Cavity - Dentition: Adequate natural dentition Vision: Functional for  self-feeding Self-Feeding Abilities: Needs set up;Able to feed self Patient Positioning: Upright in bed (helped to position himself upright) Baseline Vocal Quality: Low vocal intensity (slight-min - baseline Parkinson's Dis.) Volitional Cough: Strong Volitional Swallow: Able to elicit    Oral/Motor/Sensory Function Overall Oral Motor/Sensory Function: Within functional limits (grossly -- slight lingual tremor during volitional movements)   Ice Chips Ice chips: Within functional limits Presentation: Spoon (fed; 2 trials)   Thin Liquid Thin Liquid: Impaired Presentation: Cup;Self Fed (~5-6 ozs total) Oral Phase Impairments:  (none) Pharyngeal  Phase Impairments: Cough - Delayed;Throat Clearing - Delayed (x5) Other Comments: occurred somewhat delayed all but 1 trial/episode; occurred despite single, small sips    Nectar Thick Nectar Thick Liquid: Within functional limits Presentation: Cup;Self Fed (4 ozs)   Honey Thick Honey Thick Liquid: Not tested   Puree Puree: Within functional limits Presentation: Self Fed;Spoon (4 ozs)   Solid     Solid: Within functional limits Presentation: Self Fed (8+ trials) Other Comments: moistened        Jerilynn Som, MS, CCC-SLP Speech Language Pathologist Rehab Services; [facility] [place] [phone] (ascom) Genova Kiner 03/28/2022,3:47 PM

## 2022-03-28 NOTE — Progress Notes (Signed)
Occupational Therapy Treatment Patient Details Name: Scott Cortez MRN: [id number] DOB: Nov 01, 1933 Today's Date: 03/28/2022   History of present illness Pt is an 88 yo male with PMH Parkinson's disease, A-fib, BPH, CHF, CAD, depression, GERD, HTN, HLD, osteoarthritis, RLS.  He presented to the hospital after feeling dizzy and falling at home. MD assessment includes falls, HTN, chronic CHF, thrombocytopenia, and A-fib.   OT comments  Upon entering session, pt sitting up in bed and agreeable to OT. Tx session targeted improving tolerance for functional mobility in the setting of ADL tasks. Pt completed bed mobility with supervision, stood from EOB with CGA-Min A, and took several steps toward recliner with Min guard using RW. Pt required increased time and verbal cues for sequencing during all activities this date. Pt left sitting in recliner with all needs in reach. Pt is making progress toward goal completion. D/C recommendation remains appropriate. OT will continue to follow acutely.    Recommendations for follow up therapy are one component of a multi-disciplinary discharge planning process, led by the attending physician.  Recommendations may be updated based on patient status, additional functional criteria and insurance authorization.    Follow Up Recommendations  Skilled nursing-short term rehab (<3 hours/day)     Assistance Recommended at Discharge Frequent or constant Supervision/Assistance  Patient can return home with the following  A lot of help with bathing/dressing/bathroom;A lot of help with walking and/or transfers;Assistance with cooking/housework;Direct supervision/assist for medications management;Assist for transportation;Help with stairs or ramp for entrance   Equipment Recommendations  Other (comment) (defer to next venue of care)    Recommendations for Other Services      Precautions / Restrictions Precautions Precautions: Fall Restrictions Weight Bearing  Restrictions: No       Mobility Bed Mobility Overal bed mobility: Needs Assistance Bed Mobility: Supine to Sit       Sit to supine: Supervision, HOB elevated   General bed mobility comments: increased time    Transfers Overall transfer level: Needs assistance Equipment used: Rolling walker (2 wheels) Transfers: Sit to/from Stand Sit to Stand: Min assist, Min guard           General transfer comment: STS from EOB and recliner     Balance Overall balance assessment: History of Falls, Needs assistance Sitting-balance support: Feet supported, Bilateral upper extremity supported Sitting balance-Leahy Scale: Good     Standing balance support: Bilateral upper extremity supported, During functional activity Standing balance-Leahy Scale: Fair                             ADL either performed or assessed with clinical judgement   ADL Overall ADL's : Needs assistance/impaired Eating/Feeding: Set up;Sitting                                   Functional mobility during ADLs: Min guard;Rolling walker (2 wheels);Cueing for sequencing;Cueing for safety      Extremity/Trunk Assessment Upper Extremity Assessment Upper Extremity Assessment: Generalized weakness   Lower Extremity Assessment Lower Extremity Assessment: Generalized weakness        Vision Baseline Vision/History: 1 Wears glasses Patient Visual Report: No change from baseline     Perception     Praxis      Cognition Arousal/Alertness: Awake/alert Behavior During Therapy: WFL for tasks assessed/performed Overall Cognitive Status: Impaired/Different from baseline Area of Impairment: Problem solving  Problem Solving: Slow processing, Requires verbal cues General Comments: A&Ox4, increased time for processing and completing all activities this date, required step by step VC at times to know what to expect with mobility        Exercises  General Exercises - Upper Extremity Chair Push Up: AROM, Strengthening, Both, 5 reps, Standing    Shoulder Instructions       General Comments negative for orthostatics during session, no c/o dizziness, SpO2 93% on RA, HR 70s-80s in supine    Pertinent Vitals/ Pain       Pain Assessment Pain Assessment: No/denies pain  Home Living                                          Prior Functioning/Environment              Frequency  Min 2X/week        Progress Toward Goals  OT Goals(current goals can now be found in the care plan section)  Progress towards OT goals: Progressing toward goals  Acute Rehab OT Goals Patient Stated Goal: to be able to walk, get stronger OT Goal Formulation: With patient Time For Goal Achievement: 04/09/22 Potential to Achieve Goals: Fair  Plan Discharge plan remains appropriate;Frequency remains appropriate    Co-evaluation                 AM-PAC OT "6 Clicks" Daily Activity     Outcome Measure   Help from another person eating meals?: None Help from another person taking care of personal grooming?: A Little Help from another person toileting, which includes using toliet, bedpan, or urinal?: A Lot Help from another person bathing (including washing, rinsing, drying)?: A Lot Help from another person to put on and taking off regular upper body clothing?: A Little Help from another person to put on and taking off regular lower body clothing?: A Lot 6 Click Score: 16    End of Session Equipment Utilized During Treatment: Rolling walker (2 wheels);Gait belt  OT Visit Diagnosis: Unsteadiness on feet (R26.81);History of falling (Z91.81);Muscle weakness (generalized) (M62.81);Pain Pain - Right/Left: Right Pain - part of body: Hip   Activity Tolerance Patient tolerated treatment well   Patient Left in chair;with call bell/phone within reach;with chair alarm set   Nurse Communication Mobility status         Time: 6754-4920 OT Time Calculation (min): 39 min  Charges: OT General Charges $OT Visit: 1 Visit OT Treatments $Self Care/Home Management : 8-22 mins $Therapeutic Exercise: 8-22 mins  [facility] MS, OTR/L ascom [phone]  03/28/22, 1:53 PM

## 2022-03-28 NOTE — TOC Progression Note (Signed)
Transition of Care [facility]) - Progression Note    Patient Details  Name: Scott Cortez MRN: [id number] Date of Birth: 1933-06-28  [facility] CM/SW Contact  Tempie Hoist, [place] Phone Number: 03/28/2022, 4:50 PM  Clinical Narrative:     TOC is working on on SNF workup for the patient.    Barriers to Discharge: Continued Medical Work up  Expected Discharge Plan and Services    Discharge Planning Services: CM Consult   Living arrangements for the past 2 months: Single Family Home                                       Social Determinants of Health (SDOH) Interventions    Readmission Risk Interventions     No data to display

## 2022-03-29 DIAGNOSIS — R42 Dizziness and giddiness: Secondary | ICD-10-CM | POA: Diagnosis not present

## 2022-03-29 DIAGNOSIS — I10 Essential (primary) hypertension: Secondary | ICD-10-CM | POA: Diagnosis not present

## 2022-03-29 DIAGNOSIS — G20A1 Parkinson's disease without dyskinesia, without mention of fluctuations: Secondary | ICD-10-CM | POA: Diagnosis not present

## 2022-03-29 DIAGNOSIS — W19XXXD Unspecified fall, subsequent encounter: Secondary | ICD-10-CM | POA: Diagnosis not present

## 2022-03-29 NOTE — Progress Notes (Signed)
[facility]                                                                               Scott Cortez, is a 88 y.o. male, DOB - 04-30-1934, XTK:[id number] Admit date - 03/25/2022    Outpatient Primary MD for the patient is [facility]  LOS - 3  days    Brief summary   Scott Cortez is an 88 yo male with PMH Parkinson's disease, A-fib, BPH, CHF, CAD, depression, GERD, HTN, HLD, osteoarthritis, RLS.  He presented to the hospital after feeling dizzy and falling at home.  He states this is a rare occasion for him and he does not usually fall. He did not have any recent changes to his medications prior to hospitalization. He was admitted for PT evaluation and further observation. PT eval recommending SNF. Pt medically stable for discharge.   Assessment & Plan    Assessment and Plan: * Falls - Differential includes medication side effects, autonomic dysfunction due to Parkinson's, orthostatic hypotension - negative orthostatic check on 11/13 with PT - PT recommending SNF - patient is agreeable.   Dizziness - Wide differential.  Negative orthostatic blood pressure check on 03/26/2022.  Etiologies considered possibly from parkinson's vs medication side effects vs BPPV given he describes some positional changes precipitating  - Therapy eval for Dix-Hallpike- negative.  - he currently denies any dizziness.   Hypertension BP parameters are optimal.   Parkinson disease - continue sinemet and requip  Chronic diastolic (congestive) heart failure (HCC) - Last echo 03/03/2019: EF 60 to 65%, impaired relaxation consistent with diastolic dysfunction -No S/S exacerbation   Atrial fibrillation (HCC) - rate controlled.  - continue coreg and eliquis  Thrombocytopenia (HCC) - Some history of transient thrombocytopenia - Continue trending for now  CAD (coronary artery disease) - Continue Coreg, losartan, atorvastatin, Eliquis.  BPH with obstruction/lower  urinary tract symptoms - Continue Flomax  Cough and phlegm in the morning.  SLP eval   PPI for possible GERD.      Estimated body mass index is 23.6 kg/m as calculated from the following:   Height as of this encounter: 5\' 8"  (1.727 m).   Weight as of this encounter: 70.4 kg.  Code Status: DNR DVT Prophylaxis:   apixaban (ELIQUIS) tablet 5 mg   Level of Care: Level of care: Telemetry Medical Family Communication: none at bedside.   Disposition Plan:     Remains inpatient appropriate: SNF placement.   Procedures:  None.   Consultants:   None.   Antimicrobials:   Anti-infectives (From admission, onward)    None        Medications  Scheduled Meds:  apixaban  5 mg Oral BID   atorvastatin  20 mg Oral Daily   carbidopa-levodopa  1 tablet Oral QHS   carbidopa-levodopa  2 tablet Oral QID   carvedilol  6.25 mg Oral BID WC   feeding supplement  237 mL Oral TID BM   losartan  25 mg Oral Q1500   multivitamin with minerals  1 tablet Oral Daily   pantoprazole  40 mg Oral Daily   rOPINIRole  0.25 mg Oral QHS  tamsulosin  0.4 mg Oral Daily   Continuous Infusions: PRN Meds:.acetaminophen **OR** acetaminophen, oxyCODONE-acetaminophen    Subjective:   Scott Cortez was seen and examined today.  Reports he has been cold all day.    Objective:   Vitals:   03/28/22 1950 03/29/22 0434 03/29/22 0500 03/29/22 0812  BP: 94/62 94/66  115/78  Pulse: 83 73  71  Resp: 18 17  17   Temp: (!) 97.5 F (36.4 C) 97.8 F (36.6 C)  (!) 97.5 F (36.4 C)  TempSrc: Oral Oral  Oral  SpO2: 96% 97%  99%  Weight:   70.4 kg   Height:        Intake/Output Summary (Last 24 hours) at 03/29/2022 1253 Last data filed at 03/29/2022 1111 Gross per 24 hour  Intake 523 ml  Output 1700 ml  Net -1177 ml    Filed Weights   03/26/22 2217 03/28/22 0532 03/29/22 0500  Weight: 73.8 kg 71.9 kg 70.4 kg     Exam General exam: Appears calm and comfortable  Respiratory system: Clear to  auscultation. Respiratory effort normal. Cardiovascular system: S1 & S2 heard, RRR. No JVD,  Gastrointestinal system: Abdomen is nondistended, soft and nontender.  Central nervous system: Alert and oriented. No focal neurological deficits. Extremities: Symmetric 5 x 5 power. Skin: No rashes, lesions or ulcers Psychiatry:Mood & affect appropriate.    Data Reviewed:  I have personally reviewed following labs and imaging studies   CBC Lab Results  Component Value Date   WBC 4.9 03/26/2022   RBC 3.81 (L) 03/26/2022   HGB 12.2 (L) 03/26/2022   HCT 35.8 (L) 03/26/2022   MCV 94.0 03/26/2022   MCH 32.0 03/26/2022   PLT 140 (L) 03/26/2022   MCHC 34.1 03/26/2022   RDW 12.7 03/26/2022   LYMPHSABS 0.8 05/17/2021   MONOABS 0.5 05/17/2021   EOSABS 0.1 05/17/2021   BASOSABS 0.0 [id number]     Last metabolic panel Lab Results  Component Value Date   NA 135 03/26/2022   K 3.9 03/26/2022   CL 102 03/26/2022   CO2 27 03/26/2022   BUN 18 03/26/2022   CREATININE 0.71 03/26/2022   GLUCOSE 95 03/26/2022   GFRNONAA >60 03/26/2022   GFRAA >60 12/18/2019   CALCIUM 9.0 03/26/2022   PROT 5.9 (L) 03/26/2022   ALBUMIN 3.4 (L) 03/26/2022   LABGLOB 2.5 03/10/2019   AGRATIO 1.4 03/10/2019   BILITOT 2.7 (H) 03/26/2022   ALKPHOS 74 03/26/2022   AST 21 03/26/2022   ALT <5 03/26/2022   ANIONGAP 6 03/26/2022    CBG (last 3)  No results for input(s): "GLUCAP" in the last 72 hours.    Coagulation Profile: No results for input(s): "INR", "PROTIME" in the last 168 hours.   Radiology Studies: No results found.     Scott Cortez M.D. [facility] 03/29/2022, 12:53 PM  Available via Epic secure chat 7am-7pm After 7 pm, please refer to night coverage provider listed on amion.

## 2022-03-29 NOTE — NC FL2 (Signed)
[place] MEDICAID FL2 LEVEL OF CARE SCREENING TOOL     IDENTIFICATION  Patient Name: Scott Cortez Birthdate: January 06, 1934 Sex: male Admission Date (Current Location): 03/25/2022  [facility] and [place] Number:  Chiropodist and Address:  [facility], [address] [place], [place] [address]      Provider Number: [id number]  Attending Physician Name and Address:  Kathlen Mody, MD  Relative Name and Phone Number:  [name]- significant other- [phone]    Current Level of Care: Hospital Recommended Level of Care: Skilled Nursing Facility Prior Approval Number:    Date Approved/Denied: 03/27/22 PASRR Number: pending level II PASSR  Discharge Plan: SNF    Current Diagnoses: Patient Active Problem List   Diagnosis Date Noted   Dizziness 03/27/2022   Parkinson disease 03/27/2022   Fall 03/26/2022   Physical deconditioning 03/26/2022   CAD (coronary artery disease) 03/25/2022   Hypertension 03/25/2022   Falls 03/25/2022   Thrombocytopenia (HCC) 03/25/2022   Atrial fibrillation (HCC) 03/25/2022   Chronic diastolic (congestive) heart failure (HCC) 03/25/2022   Right groin pain 05/17/2021   Near syncope 03/07/2019   Nocturia 06/26/2016   Lower abdominal pain 09/04/2015   Erectile dysfunction 08/30/2014   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   Incomplete emptying of bladder 01/26/2014   Increased frequency of urination 01/26/2014   Slowing of urinary stream 01/26/2014    Orientation RESPIRATION BLADDER Height & Weight     Self, Time, Situation, Place  Normal External catheter Weight: 155 lb 3.3 oz (70.4 kg) Height:  5\' 8"  (172.7 cm)  BEHAVIORAL SYMPTOMS/MOOD NEUROLOGICAL BOWEL NUTRITION STATUS   (none)  (Anxiety, AOx2) Continent Diet (Regular)  AMBULATORY STATUS COMMUNICATION OF NEEDS Skin   Limited Assist Verbally Normal (appropriate, dry, flakey, intact, non-tenting)                        Personal Care Assistance Level of Assistance  Bathing, Feeding, Dressing Bathing Assistance: Maximum assistance Feeding assistance: Limited assistance (pharyngeal phase dysphagia) Dressing Assistance: Maximum assistance     Functional Limitations Info  Speech, Hearing, Sight Sight Info: Adequate Hearing Info: Adequate Speech Info: Adequate    SPECIAL CARE FACTORS FREQUENCY  PT (By licensed PT), OT (By licensed OT)     PT Frequency: 5x a week OT Frequency: 5x a week            Contractures Contractures Info: Not present    Additional Factors Info  Code Status Code Status Info: DNR             Current Medications (03/29/2022):  This is the current hospital active medication list Current Facility-Administered Medications  Medication Dose Route Frequency Provider Last Rate Last Admin   acetaminophen (TYLENOL) tablet 650 mg  650 mg Oral Q6H PRN 03/31/2022, MD       Or   acetaminophen (TYLENOL) suppository 650 mg  650 mg Rectal Q6H PRN Gertha Calkin, MD       apixaban Gertha Calkin) tablet 5 mg  5 mg Oral BID Everlene Balls, MD   5 mg at 03/29/22 0806   atorvastatin (LIPITOR) tablet 20 mg  20 mg Oral Daily 03/31/22, MD   20 mg at 03/29/22 0806   carbidopa-levodopa (SINEMET CR) 50-200 MG per tablet controlled release 1 tablet  1 tablet Oral QHS 03/31/22, MD   1 tablet at 03/28/22 2140   carbidopa-levodopa (SINEMET IR) 25-100 MG per tablet  immediate release 2 tablet  2 tablet Oral QID Gertha Calkin, MD   2 tablet at 03/29/22 1434   carvedilol (COREG) tablet 6.25 mg  6.25 mg Oral BID WC Lewie Chamber, MD   6.25 mg at 03/29/22 0813   feeding supplement (ENSURE ENLIVE / ENSURE PLUS) liquid 237 mL  237 mL Oral TID BM Kathlen Mody, MD   237 mL at 03/29/22 1434   losartan (COZAAR) tablet 25 mg  25 mg Oral Q1500 Gertha Calkin, MD   25 mg at 03/29/22 1434   multivitamin with minerals tablet 1 tablet  1 tablet Oral Daily Kathlen Mody, MD   1 tablet at 03/29/22  0807   oxyCODONE-acetaminophen (PERCOCET/ROXICET) 5-325 MG per tablet 1 tablet  1 tablet Oral Q8H PRN Gertha Calkin, MD       pantoprazole (PROTONIX) EC tablet 40 mg  40 mg Oral Daily Kathlen Mody, MD   40 mg at 03/29/22 0806   rOPINIRole (REQUIP) tablet 0.25 mg  0.25 mg Oral QHS Irena Cords V, MD   0.25 mg at 03/28/22 2140   tamsulosin (FLOMAX) capsule 0.4 mg  0.4 mg Oral Daily Gertha Calkin, MD   0.4 mg at 03/29/22 6270     Discharge Medications: Please see discharge summary for a list of discharge medications.  Relevant Imaging Results:  Relevant Lab Results:   Additional Information SSN: [id number]  Tempie Hoist, LCSWA

## 2022-03-29 NOTE — Progress Notes (Signed)
PT Cancellation Note  Patient Details Name: Scott Cortez MRN: [id number] DOB: Aug 26, 1933   Cancelled Treatment:    Reason Eval/Treat Not Completed: Patient declined, no reason specified Patient initially stating he is not allowed to get out of bed, reports he is cold and has been cold since he has been here. Friend arrived with clothes that I offered to help him get on, he reports he would like a bath first. NT notified. Heat turned up. Will re-attempt later, after bath as time allows.    Kazzandra Desaulniers 03/29/2022, 11:35 AM

## 2022-03-29 NOTE — Progress Notes (Addendum)
Physical Therapy Treatment Patient Details Name: Scott Cortez MRN: [id number] DOB: [birth year] Today's Date: 03/29/2022   History of Present Illness Pt is an 88 yo male with PMH Parkinson's disease, A-fib, BPH, CHF, CAD, depression, GERD, HTN, HLD, osteoarthritis, RLS.  He presented to the hospital after feeling dizzy and falling at home. MD assessment includes falls, HTN, chronic CHF, thrombocytopenia, and A-fib.    PT Comments    Patient received in bed, he is agreeable to PT session. Patient requires increased time for all mobility due to slow motor planning due to Parkinson's. Initially requiring min A for bed mobility and transfers.Once he gets going he really just needs supervision and B UE support on some stable base. Able to transfer bed><recliner and performed standing exercises. He will continue to benefit from skilled PT while here to improve functional independence and safety. He has the most difficulty with ambulation due to inability to really move feet well.  SNF continues to be appropriate discharge plan. Very good effort and motivation.       Recommendations for follow up therapy are one component of a multi-disciplinary discharge planning process, led by the attending physician.  Recommendations may be updated based on patient status, additional functional criteria and insurance authorization.  Follow Up Recommendations  Skilled nursing-short term rehab (<3 hours/day) Can patient physically be transported by private vehicle: No   Assistance Recommended at Discharge Intermittent Supervision/Assistance  Patient can return home with the following A lot of help with bathing/dressing/bathroom;A lot of help with walking and/or transfers   Equipment Recommendations  None recommended by PT    Recommendations for Other Services       Precautions / Restrictions Precautions Precautions: Fall Restrictions Weight Bearing Restrictions: No     Mobility  Bed  Mobility Overal bed mobility: Needs Assistance Bed Mobility: Supine to Sit, Sit to Supine     Supine to sit: Min guard Sit to supine: Min guard   General bed mobility comments: increased time, effort, use of rail, and min guard for BLE    Transfers Overall transfer level: Needs assistance Equipment used: Rolling walker (2 wheels) Transfers: Sit to/from Stand Sit to Stand: Min assist, Supervision           General transfer comment: Min A initially, progressing to supervision with increased reps and time    Ambulation/Gait Ambulation/Gait assistance: Supervision Gait Distance (Feet): 4 Feet Assistive device: Rolling walker (2 wheels) Gait Pattern/deviations: Step-to pattern, Decreased step length - right, Decreased step length - left, Shuffle, Trunk flexed Gait velocity: decreased     General Gait Details: Very slow, effortful cadence with up to min A for stability   Stairs             Wheelchair Mobility    Modified Rankin (Stroke Patients Only)       Balance Overall balance assessment: History of Falls, Needs assistance Sitting-balance support: Feet supported Sitting balance-Leahy Scale: Normal     Standing balance support: Bilateral upper extremity supported, During functional activity, Reliant on assistive device for balance Standing balance-Leahy Scale: Fair Standing balance comment: good safety awareness, able to stand and perform stand to sit without UE support briefly. Otherwise, requires B UE support for transfers and gait.                            Cognition Arousal/Alertness: Awake/alert Behavior During Therapy: WFL for tasks assessed/performed Overall Cognitive Status: Within Functional Limits for tasks assessed  Following Commands: Follows one step commands with increased time     Problem Solving: Slow processing, Requires verbal cues, Decreased initiation General Comments: A&Ox4, increased  time for processing and completing all activities this date, required step by step VC at times to know what to expect with mobility        Exercises Other Exercises Other Exercises: Performed around 50 squats and mini squats with B UE support. Standing marching x 20 reps, side steps at edge of bed Close supervision for standing exercises.    General Comments        Pertinent Vitals/Pain Pain Assessment Pain Assessment: No/denies pain    Home Living                          Prior Function            PT Goals (current goals can now be found in the care plan section) Acute Rehab PT Goals Patient Stated Goal: To get stronger PT Goal Formulation: With patient Time For Goal Achievement: 04/08/22 Potential to Achieve Goals: Good Progress towards PT goals: Progressing toward goals    Frequency    Min 2X/week      PT Plan Current plan remains appropriate    Co-evaluation              AM-PAC PT "6 Clicks" Mobility   Outcome Measure  Help needed turning from your back to your side while in a flat bed without using bedrails?: A Little Help needed moving from lying on your back to sitting on the side of a flat bed without using bedrails?: A Little Help needed moving to and from a bed to a chair (including a wheelchair)?: A Little Help needed standing up from a chair using your arms (e.g., wheelchair or bedside chair)?: A Little Help needed to walk in hospital room?: A Lot Help needed climbing 3-5 steps with a railing? : Total 6 Click Score: 15    End of Session Equipment Utilized During Treatment: Gait belt Activity Tolerance: Patient tolerated treatment well Patient left: in bed;with call bell/phone within reach;with bed alarm set;with nursing/sitter in room Nurse Communication: Mobility status PT Visit Diagnosis: Unsteadiness on feet (R26.81);History of falling (Z91.81);Difficulty in walking, not elsewhere classified (R26.2);Muscle weakness  (generalized) (M62.81);Pain     Time: 1522-1600 PT Time Calculation (min) (ACUTE ONLY): 38 min  Charges:  $Gait Training: 8-22 mins $Therapeutic Exercise: 23-37 mins                     Saurav Crumble, PT, GCS 03/29/22,4:15 PM

## 2022-03-30 DIAGNOSIS — I5032 Chronic diastolic (congestive) heart failure: Secondary | ICD-10-CM | POA: Diagnosis not present

## 2022-03-30 DIAGNOSIS — W19XXXD Unspecified fall, subsequent encounter: Secondary | ICD-10-CM | POA: Diagnosis not present

## 2022-03-30 DIAGNOSIS — I10 Essential (primary) hypertension: Secondary | ICD-10-CM | POA: Diagnosis not present

## 2022-03-30 DIAGNOSIS — G20A1 Parkinson's disease without dyskinesia, without mention of fluctuations: Secondary | ICD-10-CM | POA: Diagnosis not present

## 2022-03-30 MED ORDER — ADULT MULTIVITAMIN W/MINERALS CH: 1.0000 | ORAL_TABLET | Freq: Every day | ORAL | Status: AC

## 2022-03-30 MED ORDER — PANTOPRAZOLE SODIUM 40 MG PO TBEC: 40.0000 mg | DELAYED_RELEASE_TABLET | Freq: Every day | ORAL | 1 refills | Status: DC

## 2022-03-30 MED ORDER — CARVEDILOL 6.25 MG PO TABS: 6.2500 mg | ORAL_TABLET | Freq: Two times a day (BID) | ORAL | 1 refills | Status: DC

## 2022-03-30 MED ORDER — ENSURE ENLIVE PO LIQD: 237.0000 mL | Freq: Three times a day (TID) | ORAL | 12 refills | Status: AC

## 2022-03-30 NOTE — Discharge Summary (Signed)
Physician Discharge Summary   Patient: Scott Cortez MRN: [id number] DOB: Oct 22, 1933  Admit date:     03/25/2022  Discharge date: 03/30/22  Discharge Physician: Kathlen Mody   PCP: [facility]   Recommendations at discharge:  Please follow up with PCp in one week.  Discharge Diagnoses: Principal Problem:   Falls Active Problems:   Dizziness   Hypertension   BPH with obstruction/lower urinary tract symptoms   CAD (coronary artery disease)   Thrombocytopenia (HCC)   Atrial fibrillation (HCC)   Chronic diastolic (congestive) heart failure (HCC)   Fall   Physical deconditioning   Parkinson disease    Hospital Course:  [name] is an 88 yo male with PMH Parkinson's disease, A-fib, BPH, CHF, CAD, depression, GERD, HTN, HLD, osteoarthritis, RLS.  He presented to the hospital after feeling dizzy and falling at home.  He states this is a rare occasion for him and he does not usually fall. He did not have any recent changes to his medications prior to hospitalization. He was admitted for PT evaluation and further observation. PT eval recommending SNF. Pt medically stable for discharge.    Assessment and Plan:    Falls - Differential includes medication side effects, autonomic dysfunction due to Parkinson's, orthostatic hypotension - negative orthostatic check on 11/13 with PT - PT recommending SNF but he refused SNF and wants to go home.    Dizziness - Wide differential.  Negative orthostatic blood pressure check on 03/26/2022.  Etiologies considered possibly from parkinson's vs medication side effects vs BPPV given he describes some positional changes precipitating  - Therapy eval for Dix-Hallpike- negative.  - he currently denies any dizziness.    Hypertension BP parameters are optimal.    Parkinson disease - continue sinemet and requip   Chronic diastolic (congestive) heart failure (HCC) - Last echo 03/03/2019: EF 60 to 65%, impaired  relaxation consistent with diastolic dysfunction -No S/S exacerbation     Atrial fibrillation (HCC) - rate controlled.  - continue coreg and eliquis   Thrombocytopenia (HCC) - Some history of transient thrombocytopenia - Continue trending for now   CAD (coronary artery disease) - Continue Coreg, losartan, atorvastatin, Eliquis.   BPH with obstruction/lower urinary tract symptoms - Continue Flomax   Cough and phlegm in the morning.  SLP eval   PPI for possible GERD.     Consultants: none.  Procedures performed: none.   Disposition: Home Diet recommendation:  Discharge Diet Orders (From admission, onward)     Start     Ordered   03/30/22 0000  Diet - low sodium heart healthy        03/30/22 1045           Regular diet DISCHARGE MEDICATION: Allergies as of 03/30/2022       Reactions   Pneumococcal Vaccines Swelling        Medication List     STOP taking these medications    oxyCODONE-acetaminophen 5-325 MG tablet Commonly known as: Percocet       TAKE these medications    acetaminophen 500 MG tablet Commonly known as: TYLENOL Take 1,000 mg by mouth every 8 (eight) hours as needed for mild pain.   apixaban 5 MG Tabs tablet Commonly known as: ELIQUIS Take 1 tablet (5 mg total) by mouth 2 (two) times daily.   atorvastatin 20 MG tablet Commonly known as: LIPITOR Take 20 mg by mouth daily.   carbidopa-levodopa 50-200 MG tablet Commonly known as: SINEMET CR Take 1  tablet by mouth at bedtime.   carbidopa-levodopa 25-100 MG tablet Commonly known as: SINEMET IR Take 2 tablets by mouth 4 (four) times daily. At 0800, 1100, 1400, 1700   carvedilol 6.25 MG tablet Commonly known as: COREG Take 1 tablet (6.25 mg total) by mouth 2 (two) times daily with a meal. What changed:  medication strength how much to take   feeding supplement Liqd Take 237 mLs by mouth 3 (three) times daily between meals.   furosemide 20 MG tablet Commonly known as:  LASIX Take 20 mg by mouth daily.   losartan 25 MG tablet Commonly known as: COZAAR Take 25 mg by mouth daily in the afternoon. At 1400   multivitamin with minerals Tabs tablet Take 1 tablet by mouth daily. Start taking on: March 31, 2022   pantoprazole 40 MG tablet Commonly known as: PROTONIX Take 1 tablet (40 mg total) by mouth daily. Start taking on: March 31, 2022   polyethylene glycol 17 g packet Commonly known as: MIRALAX / GLYCOLAX Take 17 g by mouth daily as needed for mild constipation.   rOPINIRole 0.25 MG tablet Commonly known as: REQUIP Take 0.25 mg by mouth at bedtime.   tamsulosin 0.4 MG Caps capsule Commonly known as: FLOMAX Take 1 capsule (0.4 mg total) by mouth daily. What changed:  when to take this additional instructions        Discharge Exam: Filed Weights   03/28/22 0532 03/29/22 0500 03/30/22 0500  Weight: 71.9 kg 70.4 kg 69.5 kg   General exam: Appears calm and comfortable  Respiratory system: Clear to auscultation. Respiratory effort normal. Cardiovascular system: S1 & S2 heard, RRR. No JVD, murmurs, rubs, gallops or clicks. No pedal edema. Gastrointestinal system: Abdomen is nondistended, soft and nontender. No organomegaly or masses felt. Normal bowel sounds heard. Central nervous system: Alert and oriented. No focal neurological deficits. Extremities: Symmetric 5 x 5 power. Skin: No rashes, lesions or ulcers Psychiatry: Judgement and insight appear normal. Mood & affect appropriate.    Condition at discharge: fair  The results of significant diagnostics from this hospitalization (including imaging, microbiology, ancillary and laboratory) are listed below for reference.   Imaging Studies: DG Hip Unilat W or Wo Pelvis 2-3 Views Right  Result Date: 03/25/2022 CLINICAL DATA:  Status post fall. EXAM: DG HIP (WITH OR WITHOUT PELVIS) 2-3V RIGHT COMPARISON:  None Available. FINDINGS: There is no evidence of hip fracture or  dislocation. Moderate to marked severity degenerative changes are seen in the form of joint space narrowing and acetabular sclerosis. Marked severity vascular calcification is seen. IMPRESSION: Moderate to marked severity degenerative changes without evidence of acute fracture or dislocation. Electronically Signed   By: Aram Candela M.D.   On: 03/25/2022 20:06   CT HEAD WO CONTRAST  Result Date: 03/25/2022 CLINICAL DATA:  Head trauma, minor (Age >= 65y) EXAM: CT HEAD WITHOUT CONTRAST TECHNIQUE: Contiguous axial images were obtained from the base of the skull through the vertex without intravenous contrast. RADIATION DOSE REDUCTION: This exam was performed according to the departmental dose-optimization program which includes automated exposure control, adjustment of the mA and/or kV according to patient size and/or use of iterative reconstruction technique. COMPARISON:  CT head 12/22/2020. FINDINGS: Brain: No evidence of acute infarction, hemorrhage, hydrocephalus, extra-axial collection or mass lesion/mass effect. Similar remote left frontal infarct and chronic microvascular ischemic disease. Similar generalized atrophy. Similar dural mineralization. Vascular: No hyperdense vessel.  Calcific atherosclerosis. Skull: No acute fracture. Sinuses/Orbits: Clear sinuses.  No acute orbital findings.  Other: No mastoid effusions. IMPRESSION: No evidence of acute intracranial abnormality. Electronically Signed   By: Feliberto Harts M.D.   On: 03/25/2022 19:34    Microbiology: Results for orders placed or performed during the hospital encounter of 03/25/22  SARS Coronavirus 2 by RT PCR (hospital order, performed in [facility] hospital lab) *cepheid single result test* Anterior Nasal Swab     Status: None   Collection Time: 03/25/22  8:38 PM   Specimen: Anterior Nasal Swab  Result Value Ref Range Status   SARS Coronavirus 2 by RT PCR NEGATIVE NEGATIVE Final    Comment: (NOTE) SARS-CoV-2 target nucleic  acids are NOT DETECTED.  The SARS-CoV-2 RNA is generally detectable in upper and lower respiratory specimens during the acute phase of infection. The lowest concentration of SARS-CoV-2 viral copies this assay can detect is 250 copies / mL. A negative result does not preclude SARS-CoV-2 infection and should not be used as the sole basis for treatment or other patient management decisions.  A negative result may occur with improper specimen collection / handling, submission of specimen other than nasopharyngeal swab, presence of viral mutation(s) within the areas targeted by this assay, and inadequate number of viral copies (<250 copies / mL). A negative result must be combined with clinical observations, patient history, and epidemiological information.  Fact Sheet for Patients:   RoadLapTop.co.za  Fact Sheet for Healthcare Providers: http://kim-miller.com/  This test is not yet approved or  cleared by the [place] FDA and has been authorized for detection and/or diagnosis of SARS-CoV-2 by FDA under an Emergency Use Authorization (EUA).  This EUA will remain in effect (meaning this test can be used) for the duration of the COVID-19 declaration under Section 564(b)(1) of the Act, 21 U.S.C. section 360bbb-3(b)(1), unless the authorization is terminated or revoked sooner.  Performed at [facility], [address] [place], [place] [address]     Labs: CBC: Recent Labs  Lab 03/25/22 1855 03/26/22 0529  WBC 4.8 4.9  HGB 14.0 12.2*  HCT 41.6 35.8*  MCV 95.4 94.0  PLT 142* 140*   Basic Metabolic Panel: Recent Labs  Lab 03/25/22 1855 03/26/22 0529  NA 137 135  K 4.2 3.9  CL 100 102  CO2 30 27  GLUCOSE 105* 95  BUN 22 18  CREATININE 0.90 0.71  CALCIUM 9.4 9.0   Liver Function Tests: Recent Labs  Lab 03/25/22 1855 03/26/22 0529  AST 23 21  ALT 7 <5  ALKPHOS 87 74  BILITOT 2.4* 2.7*  PROT 7.0 5.9*   ALBUMIN 4.1 3.4*   CBG: No results for input(s): "GLUCAP" in the last 168 hours.  Discharge time spent: 42 minutes.   Signed: Kathlen Mody, MD [facility] 03/30/2022

## 2022-03-30 NOTE — TOC Progression Note (Addendum)
Transition of Care [facility]) - Progression Note    Patient Details  Name: Scott Cortez MRN: [id number] Date of Birth: 11-Sep-1933  Transition of Care [facility]) CM/SW Contact  Tempie Hoist, [place] Phone Number: 03/30/2022, 11:44 AM  Clinical Narrative:     TOC spoke to the patient over the phone. He is agreeable to [facility], HHPT, OT, SW and home health aide. He still lives at [address] [place], [place] [address]. His doctor is at [facility].  1252: The patient will have services from [facility].  Expected Discharge Plan: Home w Home Health Services Barriers to Discharge: Continued Medical Work up  Expected Discharge Plan and Services Expected Discharge Plan: Home w Home Health Services   Discharge Planning Services: CM Consult   Living arrangements for the past 2 months: Single Family Home Expected Discharge Date: 03/30/22                                     Social Determinants of Health (SDOH) Interventions    Readmission Risk Interventions     No data to display

## 2022-03-30 NOTE — TOC Progression Note (Signed)
Transition of Care [facility]) - Progression Note    Patient Details  Name: Scott Cortez MRN: [id number] Date of Birth: [birth year]  Transition of Care [facility]) CM/SW Contact  Tempie Hoist, [place] Phone Number: 03/30/2022, 11:16 AM  Clinical Narrative:     TOC spoke to is arranging home health in [place]. The patient sees a PCP at [facility]. He does not currently have home health but does have a cleaner.  Expected Discharge Plan: Home w Home Health Services Barriers to Discharge: Continued Medical Work up  Expected Discharge Plan and Services Expected Discharge Plan: Home w Home Health Services   Discharge Planning Services: CM Consult   Living arrangements for the past 2 months: Single Family Home                                       Social Determinants of Health (SDOH) Interventions    Readmission Risk Interventions     No data to display

## 2022-03-30 NOTE — Care Management Important Message (Signed)
Important Message  Patient Details  Name: Scott Cortez MRN: [id number] Date of Birth: 08-10-1933   Medicare Important Message Given:  Yes  I reviewed with the patient and his significant other and they are in agreement with the discharge. I thanked them for their time and wished him a speedy recovery.   [name] A Syanne Looney 03/30/2022, 12:12 PM

## 2022-03-30 NOTE — Progress Notes (Signed)
Occupational Therapy Treatment Patient Details Name: Scott Cortez MRN: [id number] DOB: March 11, 1934 Today's Date: 03/30/2022   History of present illness Pt is an 88 yo male with PMH Parkinson's disease, A-fib, BPH, CHF, CAD, depression, GERD, HTN, HLD, osteoarthritis, RLS.  He presented to the hospital after feeling dizzy and falling at home. MD assessment includes falls, HTN, chronic CHF, thrombocytopenia, and A-fib.   OT comments  Scott Cortez was seen for OT treatment on this date. Upon arrival to room pt seated EOB, attempting to eat breakfast, eager to sit in chair. Pt requires MIN A + RW bed>chair t/f, assist to stabilize RW and cues to sequence mobility. MIN A for opening meal packages, left with all needs in reach. Pt making good progress toward goals, will continue to follow POC. Discharge recommendation remains appropriate.     Recommendations for follow up therapy are one component of a multi-disciplinary discharge planning process, led by the attending physician.  Recommendations may be updated based on patient status, additional functional criteria and insurance authorization.    Follow Up Recommendations  Skilled nursing-short term rehab (<3 hours/day)     Assistance Recommended at Discharge Frequent or constant Supervision/Assistance  Patient can return home with the following  A lot of help with bathing/dressing/bathroom;A lot of help with walking and/or transfers;Assistance with cooking/housework;Direct supervision/assist for medications management;Assist for transportation;Help with stairs or ramp for entrance   Equipment Recommendations  BSC/3in1    Recommendations for Other Services      Precautions / Restrictions Precautions Precautions: Fall Restrictions Weight Bearing Restrictions: No       Mobility Bed Mobility               General bed mobility comments: seated EOB on arrival    Transfers Overall transfer level: Needs assistance Equipment  used: Rolling walker (2 wheels) Transfers: Sit to/from Stand, Bed to chair/wheelchair/BSC Sit to Stand: Min assist     Step pivot transfers: Min assist     General transfer comment: assist to stabilize RW and step by step cues     Balance Overall balance assessment: History of Falls, Needs assistance Sitting-balance support: Feet supported Sitting balance-Leahy Scale: Normal     Standing balance support: Bilateral upper extremity supported, Reliant on assistive device for balance Standing balance-Leahy Scale: Fair                             ADL either performed or assessed with clinical judgement   ADL Overall ADL's : Needs assistance/impaired                                       General ADL Comments: MIN A + RW for simulated BSC t/f. MIN A for opening meal packages.    Extremity/Trunk Assessment Upper Extremity Assessment Upper Extremity Assessment: Generalized weakness   Lower Extremity Assessment Lower Extremity Assessment: Generalized weakness         Cognition Arousal/Alertness: Awake/alert Behavior During Therapy: WFL for tasks assessed/performed Overall Cognitive Status: Within Functional Limits for tasks assessed Area of Impairment: Problem solving                             Problem Solving: Slow processing, Requires verbal cues, Decreased initiation General Comments: A&Ox4, step by step VC to initiate mobility  Pertinent Vitals/ Pain       Pain Assessment Pain Assessment: No/denies pain   Frequency  Min 2X/week        Progress Toward Goals  OT Goals(current goals can now be found in the care plan section)  Progress towards OT goals: Progressing toward goals  Acute Rehab OT Goals Patient Stated Goal: to go home OT Goal Formulation: With patient Time For Goal Achievement: 04/09/22 Potential to Achieve Goals: Fair ADL Goals Pt Will Perform Grooming: with  supervision;standing Pt Will Perform Lower Body Dressing: with min assist;sit to/from stand Pt Will Transfer to Toilet: with supervision;ambulating;bedside commode Pt Will Perform Toileting - Clothing Manipulation and hygiene: with min assist;sit to/from stand  Plan Discharge plan remains appropriate;Frequency remains appropriate    Co-evaluation                 AM-PAC OT "6 Clicks" Daily Activity     Outcome Measure   Help from another person eating meals?: None Help from another person taking care of personal grooming?: A Little Help from another person toileting, which includes using toliet, bedpan, or urinal?: A Little Help from another person bathing (including washing, rinsing, drying)?: A Lot Help from another person to put on and taking off regular upper body clothing?: A Little Help from another person to put on and taking off regular lower body clothing?: A Lot 6 Click Score: 17    End of Session    OT Visit Diagnosis: Unsteadiness on feet (R26.81);History of falling (Z91.81);Muscle weakness (generalized) (M62.81);Pain Pain - Right/Left: Right Pain - part of body: Hip   Activity Tolerance Patient tolerated treatment well   Patient Left in chair;with call bell/phone within reach;with chair alarm set   Nurse Communication          Time: 5462-7035 OT Time Calculation (min): 15 min  Charges: OT General Charges $OT Visit: 1 Visit OT Treatments $Self Care/Home Management : 8-22 mins  Kathie Dike, M.S. OTR/L  03/30/22, 10:08 AM  ascom [phone]

## 2022-03-30 NOTE — Progress Notes (Signed)
Speech Language Pathology Treatment: Dysphagia  Patient Details Name: Scott Cortez MRN: [id number] DOB: [birth year] Today's Date: 03/30/2022 Time: 1030-1105 SLP Time Calculation (min) (ACUTE ONLY): 35 min  Assessment / Plan / Recommendation Clinical Impression  Pt seen for ongoing toleration of diet this morning. Pt A/O; engaged easily and was talkative. Followed instructions w/ cue.    On RA; afebrile, WBC not elevated. Noted pt does not have a CXR this admit; Lungs are CTA per MD notes. Last CXR was in 07/2021 which revealed emphysema.     OF NOTE: Pt endorse s/s of REFLUX at home and specifically describes MOD amounts of Phlegm he expectorates at various times of the day -- in absence of oral intake also. He is now on a PPI per MD this admit.  Pt does have baseline of Parkinson's Dis and his medication regimen is carefully monitored. Pt follows w/ his Neurologist.    Pt appears to present w/ inconsistent s/s of pharyngeal phase dysphagia w/ suspected Neuromuscular impact secondary to Parkinson's Dis at baseline; also noted is the s/s of Reflux and MOD amounts of Phlegm daily. These issues can impact impact Timing of pharyngeal swallow initiation thus increase risk for aspiration/aspiration pneumonia. Pt denies any Pulmonary decline nor tx for respiratory issues. He has not had any CXRs since 07/2021 nor tx for pneumonia per chart notes. No oral phase deficits noted.  Pt appears at reduced risk for aspiration when following general aspiration precautions and monitoring drinking of thin liquids.  It is recommended that pt/MD address potential Reflux s/s and the increased expectoration of Phlegm. ANY Dysmotility or Regurgitation of Reflux material can increase risk for aspiration of the Reflux material during Retrograde flow thus impact Pulmonary status.     Pt sat upright in bed w/ min+ support then consumed trials of thin liquids Via Cup w/ no immediate, overt clinical s/s of aspiration  noted w/ the trials given; clear vocal quality b/t trials, no decline in pulmonary status, no multiple swallows noted post initial pharyngeal swallow, no decline in O2 sats(97%). A mild throat clear noted post trials taken x1. Oral phase appeared [facility] for bolus management and timely A-P transfer/clearing of material.    OF NOTE: aspiration precautions were educated on and utilized w/ thin liquids (single, small sips slowly). Pt stated he was a "fast" drinker and agreed he needed to "slow down some" when drinking to lessen risk for aspiration.    Recommend continue a Regular diet (moistened, cut foods) w/ thin liquids via Cup. General aspiration precautions. REFLUX precautions strongly recommended to lessen chance for Regurgitation -- HOB elevated at night when sleeping. Reduce distractions during drinking/eating; drink more slowly w/ focus on the swallowing.   MD has initiated a PPI for pt -- this was discussed w/ pt also.  ALL Pills in PUREE for safer swallowing.   STRONGLY RECOMMEND MONITORING OF PT'S PULMONARY STATUS W/ PT'S PCP AT HOME AND RE-ADDRESS PHARYNGEAL SWALLOWING OF THIN LIQUIDS IF DECLINE IN PULMONARY STATUS AND INCREASED COUGHING OCCUR.   Recommend pt f/u w/ GI for assessment/management of Reflux and tx as indicated.    HPI HPI: Pt is an 88 yo male with PMH Parkinson's disease, A-fib, BPH, CHF, CAD, depression, GERD, HTN, HLD, osteoarthritis, RLS.  He presented to the hospital after feeling dizzy and falling at home - not typical for pt per his report. MD assessment includes falls, HTN, chronic CHF, thrombocytopenia, and A-fib.  Head CT: No evidence of acute intracranial abnormality.  Last CXR  was in 3/23 indicating emyphsema.  Pt indicated a Baseline of MOD Phlegm in the mornings and w/ meals; in absence of oral intake also.      SLP Plan  All goals met      Recommendations for follow up therapy are one component of a multi-disciplinary discharge planning process, led by the  attending physician.  Recommendations may be updated based on patient status, additional functional criteria and insurance authorization.    Recommendations  Diet recommendations: Regular;Thin liquid (cut foods; moistened) Liquids provided via: Cup;No straw Medication Administration: Whole meds with puree Supervision: Patient able to self feed;Intermittent supervision to cue for compensatory strategies Compensations: Minimize environmental distractions;Slow rate;Small sips/bites Postural Changes and/or Swallow Maneuvers: Out of bed for meals;Seated upright 90 degrees;Upright 30-60 min after meal                General recommendations:  (Dietician f/u; Palliative Care f/u for [place]) Oral Care Recommendations: Oral care BID;Oral care before and after PO;Patient independent with oral care Follow Up Recommendations: Follow physician's recommendations for discharge plan and follow up therapies Assistance recommended at discharge: PRN (monitoring of follow through w/ precautions) SLP Visit Diagnosis: Dysphagia, pharyngeal phase (R13.13);Dysphagia, pharyngoesophageal phase (R13.14) (suspect Reflux; baseline Parkinson's Dis.) Plan: All goals met            Orinda Kenner, MS, CCC-SLP Speech Language Pathologist Rehab Services; [place] [phone] (ascom) Alva Broxson  03/30/2022, 3:52 PM

## 2022-03-30 NOTE — TOC Transition Note (Addendum)
Transition of Care [facility]) - CM/SW Discharge Note   Patient Details  Name: Chistopher Mangino MRN: [id number] Date of Birth: [birth year]  Transition of Care [facility]) CM/SW Contact:  Tempie Hoist, LCSWA Phone Number: 03/30/2022, 12:56 PM   Clinical Narrative:     TOC arranged services with [facility] PT, OT, RN, SW and aide.  Final next level of care: Home w Home Health Services Barriers to Discharge: Barriers Resolved   Patient Goals and CMS Choice Patient states their goals for this hospitalization and ongoing recovery are:: patient wants to be able to get home and at least get to 80% of where he was before he fell yesterday      Discharge Placement                  Name of family member notified: [place] Patient and family notified of of transfer: 03/30/22  Discharge Plan and Services   Discharge Planning Services: CM Consult                        [facility] Agency: [facility] Date [facility] Agency Contacted: 03/30/22   Representative spoke with at [facility] Agency: Feliberto Gottron  Social Determinants of Health (SDOH) Interventions     Readmission Risk Interventions     No data to display

## 2022-06-30 ENCOUNTER — Encounter: Payer: Self-pay | Admitting: Emergency Medicine

## 2022-06-30 ENCOUNTER — Other Ambulatory Visit: Payer: Self-pay

## 2022-06-30 ENCOUNTER — Emergency Department
Admission: EM | Admit: 2022-06-30 | Discharge: 2022-06-30 | Disposition: A | Discharge status: Home / Self Care | Payer: Medicare Other | Attending: Emergency Medicine | Admitting: Emergency Medicine

## 2022-06-30 ENCOUNTER — Emergency Department: Payer: Medicare Other

## 2022-06-30 DIAGNOSIS — I2510 Atherosclerotic heart disease of native coronary artery without angina pectoris: Secondary | ICD-10-CM | POA: Diagnosis not present

## 2022-06-30 DIAGNOSIS — I509 Heart failure, unspecified: Secondary | ICD-10-CM | POA: Diagnosis not present

## 2022-06-30 DIAGNOSIS — R109 Unspecified abdominal pain: Secondary | ICD-10-CM | POA: Diagnosis present

## 2022-06-30 DIAGNOSIS — I10 Essential (primary) hypertension: Secondary | ICD-10-CM | POA: Insufficient documentation

## 2022-06-30 DIAGNOSIS — R079 Chest pain, unspecified: Secondary | ICD-10-CM | POA: Insufficient documentation

## 2022-06-30 LAB — URINALYSIS, ROUTINE W REFLEX MICROSCOPIC
Bilirubin Urine: NEGATIVE
Glucose, UA: NEGATIVE mg/dL
Hgb urine dipstick: NEGATIVE
Ketones, ur: NEGATIVE mg/dL
Leukocytes,Ua: NEGATIVE
Nitrite: NEGATIVE
Protein, ur: NEGATIVE mg/dL
Specific Gravity, Urine: 1.028 (ref 1.005–1.030)
pH: 7 (ref 5.0–8.0)

## 2022-06-30 LAB — TROPONIN I (HIGH SENSITIVITY)
Troponin I (High Sensitivity): 15 ng/L (ref <18)
Troponin I (High Sensitivity): 10 ng/L (ref <18)

## 2022-06-30 LAB — COMPREHENSIVE METABOLIC PANEL
ALT: 7 U/L (ref 0–44)
AST: 22 U/L (ref 15–41)
Albumin: 3.8 g/dL (ref 3.5–5.0)
Alkaline Phosphatase: 80 U/L (ref 38–126)
Anion gap: 6 (ref 5–15)
BUN: 20 mg/dL (ref 8–23)
CO2: 29 mmol/L (ref 22–32)
Calcium: 9.3 mg/dL (ref 8.9–10.3)
Chloride: 102 mmol/L (ref 98–111)
Creatinine, Ser: 0.77 mg/dL (ref 0.61–1.24)
GFR, Estimated: >60 mL/min (ref >60)
Glucose, Bld: 100 mg/dL — ABNORMAL HIGH (ref 70–99)
Potassium: 4.2 mmol/L (ref 3.5–5.1)
Sodium: 137 mmol/L (ref 135–145)
Total Bilirubin: 1.9 mg/dL — ABNORMAL HIGH (ref 0.3–1.2)
Total Protein: 6.4 g/dL — ABNORMAL LOW (ref 6.5–8.1)

## 2022-06-30 LAB — CBC WITH DIFFERENTIAL/PLATELET
Abs Immature Granulocytes: 0.01 10*3/uL (ref 0.00–0.07)
Basophils Absolute: 0 10*3/uL (ref 0.0–0.1)
Basophils Relative: 1 %
Eosinophils Absolute: 0.3 10*3/uL (ref 0.0–0.5)
Eosinophils Relative: 6 %
HCT: 40 % (ref 39.0–52.0)
Hemoglobin: 13.3 g/dL (ref 13.0–17.0)
Immature Granulocytes: 0 %
Lymphocytes Relative: 19 %
Lymphs Abs: 0.9 10*3/uL (ref 0.7–4.0)
MCH: 31.9 pg (ref 26.0–34.0)
MCHC: 33.3 g/dL (ref 30.0–36.0)
MCV: 95.9 fL (ref 80.0–100.0)
Monocytes Absolute: 0.7 10*3/uL (ref 0.1–1.0)
Monocytes Relative: 14 %
Neutro Abs: 3 10*3/uL (ref 1.7–7.7)
Neutrophils Relative %: 60 %
Platelets: 144 10*3/uL — ABNORMAL LOW (ref 150–400)
RBC: 4.17 MIL/uL — ABNORMAL LOW (ref 4.22–5.81)
RDW: 12.6 % (ref 11.5–15.5)
WBC: 5 10*3/uL (ref 4.0–10.5)
nRBC: 0 % (ref 0.0–0.2)

## 2022-06-30 LAB — LIPASE, BLOOD: Lipase: 34 U/L (ref 11–51)

## 2022-06-30 MED ORDER — IOHEXOL 350 MG/ML SOLN
100.0000 mL | Freq: Once | INTRAVENOUS | Status: AC | PRN
  Administered 2022-06-30: 100 mL via INTRAVENOUS

## 2022-06-30 MED ORDER — SODIUM CHLORIDE 0.9 % IV BOLUS (SEPSIS)
500.0000 mL | Freq: Once | INTRAVENOUS | Status: AC
  Administered 2022-06-30: 500 mL via INTRAVENOUS

## 2022-06-30 NOTE — ED Notes (Signed)
Pt verbalized understanding of discharge paperwork and follow-up care. Pt called significant other to pick him up and will be here in hour. Pt wheeled to waiting room with belongings.

## 2022-06-30 NOTE — ED Provider Notes (Signed)
-----------------------------------------   8:47 AM on 06/30/2022 -----------------------------------------  I took over care of this patient from Dr. Leonides Schanz.  Repeat troponin and urinalysis are both negative.  The patient has not had any recurrence of pain and he is asymptomatic at this time.  He is stable for discharge.  I counseled him on the results of the workup.  He will follow-up with his regular doctor.  Return precautions given, and he expresses understanding.   Arta Silence, MD 06/30/22 [phone]

## 2022-06-30 NOTE — ED Triage Notes (Signed)
  Patient BIB EMS for L sided flank pain.  Patient states pain has resolved but was sudden in onset earlier in the night and he was concerned.  Denies any dysuria.  No pain at this time.

## 2022-06-30 NOTE — ED Provider Notes (Signed)
[facility] Provider Note    Event Date/Time   First MD Initiated Contact with Patient 06/30/22 [phone]     (approximate)   History   Flank Pain   HPI  Scott Cortez is a 89 y.o. male with history of CAD, A-fib on Eliquis, cardiomyopathy, hypertension, hyperlipidemia, Parkinson's, chronic pancreatitis who presents to the emergency department complaints of left flank pain started tonight at rest.  He called 911.  Symptoms have now resolved.  No fevers, cough, rash, chest pain.  States he always feels short of breath but this is not changed for him.  No history of kidney stones.  No recent falls.  No dysuria or hematuria.  No nausea, vomiting or diarrhea.   History provided by patient, EMS.    Past Medical History:  Diagnosis Date   AAA (abdominal aortic aneurysm) ([place])    a.) CTA 12/31/2015: measured 3.1 cm. b.) CTA 03/07/2019: measured 3.5 cm. c.) CT abdomen 05/05/2021: measured  2.8 cm. d.) CTA 05/17/2021: measured 3.2 cm   Acute MI, inferior wall ([place]) 11/19/2003   a.) LHC 11/22/2003: EF 49%; 20% pRCA, 90% mRCA, 20% LM, 20% pLCx, 20% mLAD, 20% D1; unable to perform PCI --> transferred to [facility] and stent (unknown type) was placed to the RCA.   Aortic atherosclerosis (HCC)    Atrial fibrillation (HCC)    a.) CHA2DS2-VASc = 5 (age x 2, CHF, HTN, previous MI/aortic plaque). b.) rate/hythm maintained on oral carvedilol; chronically anticoagulated using apixaban.   Bladder neck obstruction    BPH (benign prostatic hyperplasia)    Bradycardia    Cardiac murmur    Cardiomyopathy (HCC)    CHF (congestive heart failure) ([place])    a.) TTE 01/05/2014: EF 50%; mild LA enlargement; triv AR//PR, mild MR/TR; G1DD. b.) TTE 03/08/2019: EF 60-65%; RV mildly enlarged. c.) TTE 09/26/2020: EF 30%; mild LVH; mild LA enlargement; mild MR/TR/PR.   Chronic pancreatitis (HCC)    Coronary artery disease    a.) inferior wall MI 11/19/2003; LHC 11/22/2003: EF 49%; 20% pRCA,  90% mRCA, 20% LM, 20% pLCx, 20% mLAD, 20% D1; unable to perform PCI --> transferred to [facility] and stent (unknown type) was placed to the RCA. b.) MI 2007/2008 (date unknown); PCI placing a second stent (unknown type) to RCA. c.) LHC 02/02/2009: 50% RPLS; intervention deferred opting for med mgmt.   Current use of long term anticoagulation    a.) apixaban   Dental bridge present    upper - bilateral   Depression    Frequent falls    GERD (gastroesophageal reflux disease)    Glaucoma    Hyperlipidemia    Hypertension    Iliac artery aneurysm, left ([place])    a.)  CTA 05/17/2021: measured 1.7 cm; previously 1.5 cm in 2015.   MI (myocardial infarction) ([place]) 05/02/2007   a.) inferoposterolateral MI 05/02/2007 - PCI placing a stent (unknown type) to the RCA   Neuropathy of both feet    Osteoarthritis    Parkinson's disease    RLS (restless legs syndrome)    a.) takes ropinirole    Past Surgical History:  Procedure Laterality Date   CATARACT EXTRACTION W/PHACO Left 02/02/2019   Procedure: CATARACT EXTRACTION PHACO AND INTRAOCULAR LENS PLACEMENT (IOC) LEFT ISTENT INJ AND ISTENT 01:09.1        11.3%          8.47;  Surgeon: [name], MD;  Location: [place];  Service: Ophthalmology;  Laterality: Left;  CATARACT EXTRACTION W/PHACO Right 02/23/2019   Procedure: CATARACT EXTRACTION PHACO AND INTRAOCULAR LENS PLACEMENT (IOC) RIGHT ISTENT INJ 01:09.1  14.1%  9.78;  Surgeon: [name], MD;  Location: [place];  Service: Ophthalmology;  Laterality: Right;   COLONOSCOPY N/A 10/15/2014   Procedure: COLONOSCOPY;  Surgeon: [name], MD;  Location: [facility];  Service: Endoscopy;  Laterality: N/A;   CORONARY ANGIOPLASTY WITH STENT PLACEMENT Left 11/16/2003   PCI and stent placement to the RCA (unknown type)   CORONARY ANGIOPLASTY WITH STENT PLACEMENT Left 05/02/2007   PCI with stenting of the RCA (unknown type)   ESOPHAGOGASTRODUODENOSCOPY (EGD) WITH  PROPOFOL N/A 10/15/2014   Procedure: ESOPHAGOGASTRODUODENOSCOPY (EGD) WITH PROPOFOL;  Surgeon: [name], MD;  Location: [place];  Service: Endoscopy;  Laterality: N/A;   INSERTION OF ANTERIOR SEGMENT AQUEOUS DRAINAGE DEVICE (ISTENT) Left 02/02/2019   Procedure: INSERTION OF ANTERIOR SEGMENT AQUEOUS DRAINAGE DEVICE (ISTENT);  Surgeon: [name], MD;  Location: [place];  Service: Ophthalmology;  Laterality: Left;   LEFT HEART CATH AND CORONARY ANGIOGRAPHY Left 02/02/2009   Procedure: LEFT HEART CATH AND CORONARY ANGIOGRAPHY; Location: [place]; Surgeon: Katrine Coho, MD   SAVORY DILATION N/A 10/15/2014   Procedure: Azzie Almas DILATION;  Surgeon: [name], MD;  Location: [facility];  Service: Endoscopy;  Laterality: N/A;   XI ROBOTIC ASSISTED INGUINAL HERNIA REPAIR WITH MESH  07/27/2021   Procedure: XI ROBOTIC ASSISTED INGUINAL HERNIA REPAIR WITH MESH;  Surgeon: Benjamine Sprague, DO;  Location: [facility];  Service: General;;    MEDICATIONS:  Prior to Admission medications   Medication Sig Start Date End Date Taking? Authorizing Provider  acetaminophen (TYLENOL) 500 MG tablet Take 1,000 mg by mouth every 8 (eight) hours as needed for mild pain.    [provider]  apixaban (ELIQUIS) 5 MG TABS tablet Take 1 tablet (5 mg total) by mouth 2 (two) times daily. 03/12/19   Max Sane, MD  atorvastatin (LIPITOR) 20 MG tablet Take 20 mg by mouth daily.    [provider]  carbidopa-levodopa (SINEMET CR) 50-200 MG tablet Take 1 tablet by mouth at bedtime.    [provider]  carbidopa-levodopa (SINEMET IR) 25-100 MG tablet Take 2 tablets by mouth 4 (four) times daily. At 0800, 1100, 1400, 1700    [provider]  carvedilol (COREG) 6.25 MG tablet Take 1 tablet (6.25 mg total) by mouth 2 (two) times daily with a meal. 03/30/22   Hosie Poisson, MD  feeding supplement (ENSURE ENLIVE / ENSURE PLUS) LIQD Take 237 mLs by mouth 3 (three) times  daily between meals. 03/30/22   Hosie Poisson, MD  furosemide (LASIX) 20 MG tablet Take 20 mg by mouth daily.    [provider]  losartan (COZAAR) 25 MG tablet Take 25 mg by mouth daily in the afternoon. At 1400    [provider]  Multiple Vitamin (MULTIVITAMIN WITH MINERALS) TABS tablet Take 1 tablet by mouth daily. 03/31/22   Hosie Poisson, MD  pantoprazole (PROTONIX) 40 MG tablet Take 1 tablet (40 mg total) by mouth daily. 03/31/22   Hosie Poisson, MD  polyethylene glycol (MIRALAX / GLYCOLAX) 17 g packet Take 17 g by mouth daily as needed for mild constipation.    [provider]  rOPINIRole (REQUIP) 0.25 MG tablet Take 0.25 mg by mouth at bedtime.    [provider]  tamsulosin (FLOMAX) 0.4 MG CAPS capsule Take 1 capsule (0.4 mg total) by mouth daily. Patient taking differently:  Take 0.4 mg by mouth daily with supper. At 1700 09/12/20   Abbie Sons, MD    Physical Exam   Triage Vital Signs: ED Triage Vitals  Enc Vitals Group     BP 06/30/22 0551 (!) 136/96     Pulse Rate 06/30/22 0551 76     Resp 06/30/22 0551 18     Temp 06/30/22 0551 98.2 F (36.8 C)     Temp Source 06/30/22 0551 Oral     SpO2 06/30/22 0551 100 %     Weight 06/30/22 0548 153 lb (69.4 kg)     Height 06/30/22 0548 5' 8"$  (1.727 m)     Head Circumference --      Peak Flow --      Pain Score 06/30/22 0548 0     Pain Loc --      Pain Edu? --      Excl. in [place]? --     Most recent vital signs: Vitals:   06/30/22 0551  BP: (!) 136/96  Pulse: 76  Resp: 18  Temp: 98.2 F (36.8 C)  SpO2: 100%    CONSTITUTIONAL: Alert, responds appropriately to questions. Well-appearing; well-nourished, elderly, hard of hearing HEAD: Normocephalic, atraumatic EYES: Conjunctivae clear, pupils appear equal, sclera nonicteric ENT: normal nose; moist mucous membranes NECK: Supple, normal ROM CARD: Irregular and rate controlled; S1 and S2 appreciated, no tenderness over the left chest wall  and no rash, soft tissue swelling, ecchymosis, crepitus RESP: Normal chest excursion without splinting or tachypnea; breath sounds clear and equal bilaterally; no wheezes, no rhonchi, no rales, no hypoxia or respiratory distress, speaking full sentences ABD/GI: Non-distended; soft, non-tender, no rebound, no guarding, no peritoneal signs BACK: The back appears normal, no midline spinal tenderness or step-off or deformity EXT: Normal ROM in all joints; no deformity noted, no edema SKIN: Normal color for age and race; warm; no rash on exposed skin NEURO: Moves all extremities equally, normal speech PSYCH: The patient's mood and manner are appropriate.   ED Results / Procedures / Treatments   LABS: (all labs ordered are listed, but only abnormal results are displayed) Labs Reviewed  CBC WITH DIFFERENTIAL/PLATELET - Abnormal; Notable for the following components:      Result Value   RBC 4.17 (*)    Platelets 144 (*)    All other components within normal limits  COMPREHENSIVE METABOLIC PANEL - Abnormal; Notable for the following components:   Glucose, Bld 100 (*)    Total Protein 6.4 (*)    Total Bilirubin 1.9 (*)    All other components within normal limits  LIPASE, BLOOD  URINALYSIS, ROUTINE W REFLEX MICROSCOPIC  TROPONIN I (HIGH SENSITIVITY)     EKG:  EKG Interpretation  Date/Time:  Saturday June 30 2022 05:52:26 EST Ventricular Rate:  77 PR Interval:    QRS Duration: 115 QT Interval:  388 QTC Calculation: [id number] R Axis:   67 Text Interpretation: Atrial fibrillation Incomplete right bundle branch block Confirmed by Pryor Curia [phone]) on 06/30/2022 5:54:10 AM         RADIOLOGY: My personal review and interpretation of imaging: CT shows no PE, pneumonia, pyelonephritis, kidney stone, diverticulitis.  I have personally reviewed all radiology reports.   CT Angio Chest PE W and/or Wo Contrast  Result Date: 06/30/2022 CLINICAL DATA:  Abdominal/flank pain with stone  suspected. Pain is currently resolved. EXAM: CT ANGIOGRAPHY CHEST CT ABDOMEN AND PELVIS WITH CONTRAST TECHNIQUE: Multidetector CT imaging of the chest was performed using the  standard protocol during bolus administration of intravenous contrast. Multiplanar CT image reconstructions and MIPs were obtained to evaluate the vascular anatomy. Multidetector CT imaging of the abdomen and pelvis was performed using the standard protocol during bolus administration of intravenous contrast. RADIATION DOSE REDUCTION: This exam was performed according to the departmental dose-optimization program which includes automated exposure control, adjustment of the mA and/or kV according to patient size and/or use of iterative reconstruction technique. CONTRAST:  130m OMNIPAQUE IOHEXOL 350 MG/ML SOLN COMPARISON:  05/17/2021 abdominal CTA FINDINGS: CTA CHEST FINDINGS Cardiovascular: Satisfactory opacification of the pulmonary arteries to the segmental level. No evidence of pulmonary embolism. No pericardial effusion. Mild cardiomegaly. Atheromatous calcification of the aorta and coronaries. The aorta is not opacified due to timing. Mediastinum/Nodes: Negative for adenopathy or mass. Gas-filled diverticulum at the right thoracic inlet measuring 17 mm. Lungs/Pleura: Generalized airway thickening. Hazy density in the subpleural lungs attributed to atelectasis. There is patchy superimposed paraseptal emphysema greatest at the apices. Musculoskeletal: No acute finding. Review of the MIP images confirms the above findings. CT ABDOMEN and PELVIS FINDINGS Hepatobiliary: No focal liver abnormality.No evidence of biliary obstruction or stone. Pancreas: Unremarkable. Spleen: Unremarkable. Adrenals/Urinary Tract: Negative adrenals. No hydronephrosis or stone. Unremarkable bladder. Stomach/Bowel:  No obstruction. No visible bowel inflammation Vascular/Lymphatic: Extensive atheromatous plaque affecting the aorta and branch vessels. Chronic focal  dissection involving the infrarenal aorta with 3.2 cm maximal diameter. Chronic inferior pointing excavation of the right common iliac artery measuring 9 mm in diameter, consistent with penetrating atheromatous ulcer, long-standing. Reproductive:No acute finding. Other: No ascites or pneumoperitoneum. Musculoskeletal: Generalized lumbar degeneration. Chronic T12 inferior endplate fracture. Review of the MIP images confirms the above findings. IMPRESSION: Chest CTA: No acute finding.  Negative for pulmonary embolism. Abdominal CT: 1. No acute or interval finding. 2. Advanced atherosclerosis with infrarenal abdominal aortic aneurysm and chronic dissection. Recommend follow-up every 3 years. This recommendation follows ACR consensus guidelines: White Paper of the ACR Incidental Findings Committee II on Vascular Findings. J Am Coll Radiol 2013; 10:789-794. Electronically Signed   By: JJorje GuildM.D.   On: 06/30/2022 06:57   CT ABDOMEN PELVIS W CONTRAST  Result Date: 06/30/2022 CLINICAL DATA:  Abdominal/flank pain with stone suspected. Pain is currently resolved. EXAM: CT ANGIOGRAPHY CHEST CT ABDOMEN AND PELVIS WITH CONTRAST TECHNIQUE: Multidetector CT imaging of the chest was performed using the standard protocol during bolus administration of intravenous contrast. Multiplanar CT image reconstructions and MIPs were obtained to evaluate the vascular anatomy. Multidetector CT imaging of the abdomen and pelvis was performed using the standard protocol during bolus administration of intravenous contrast. RADIATION DOSE REDUCTION: This exam was performed according to the departmental dose-optimization program which includes automated exposure control, adjustment of the mA and/or kV according to patient size and/or use of iterative reconstruction technique. CONTRAST:  1041mOMNIPAQUE IOHEXOL 350 MG/ML SOLN COMPARISON:  05/17/2021 abdominal CTA FINDINGS: CTA CHEST FINDINGS Cardiovascular: Satisfactory opacification of  the pulmonary arteries to the segmental level. No evidence of pulmonary embolism. No pericardial effusion. Mild cardiomegaly. Atheromatous calcification of the aorta and coronaries. The aorta is not opacified due to timing. Mediastinum/Nodes: Negative for adenopathy or mass. Gas-filled diverticulum at the right thoracic inlet measuring 17 mm. Lungs/Pleura: Generalized airway thickening. Hazy density in the subpleural lungs attributed to atelectasis. There is patchy superimposed paraseptal emphysema greatest at the apices. Musculoskeletal: No acute finding. Review of the MIP images confirms the above findings. CT ABDOMEN and PELVIS FINDINGS Hepatobiliary: No focal liver abnormality.No evidence of biliary obstruction  or stone. Pancreas: Unremarkable. Spleen: Unremarkable. Adrenals/Urinary Tract: Negative adrenals. No hydronephrosis or stone. Unremarkable bladder. Stomach/Bowel:  No obstruction. No visible bowel inflammation Vascular/Lymphatic: Extensive atheromatous plaque affecting the aorta and branch vessels. Chronic focal dissection involving the infrarenal aorta with 3.2 cm maximal diameter. Chronic inferior pointing excavation of the right common iliac artery measuring 9 mm in diameter, consistent with penetrating atheromatous ulcer, long-standing. Reproductive:No acute finding. Other: No ascites or pneumoperitoneum. Musculoskeletal: Generalized lumbar degeneration. Chronic T12 inferior endplate fracture. Review of the MIP images confirms the above findings. IMPRESSION: Chest CTA: No acute finding.  Negative for pulmonary embolism. Abdominal CT: 1. No acute or interval finding. 2. Advanced atherosclerosis with infrarenal abdominal aortic aneurysm and chronic dissection. Recommend follow-up every 3 years. This recommendation follows ACR consensus guidelines: White Paper of the ACR Incidental Findings Committee II on Vascular Findings. J Am Coll Radiol 2013; 10:789-794. Electronically Signed   By: Jorje Guild  M.D.   On: 06/30/2022 06:57     PROCEDURES:  Critical Care performed: No     .1-3 Lead EKG Interpretation  Performed by: Saralynn Langhorst, Delice Bison, DO Authorized by: Maxxwell Edgett, Delice Bison, DO     Interpretation: abnormal     ECG rate:  76   ECG rate assessment: normal     Rhythm: atrial fibrillation     Ectopy: none     Conduction: normal       IMPRESSION / MDM / ASSESSMENT AND PLAN / ED COURSE  I reviewed the triage vital signs and the nursing notes.    Patient here with left flank pain that came on at rest that has now resolved.  The patient is on the cardiac monitor to evaluate for evidence of arrhythmia and/or significant heart rate changes.   DIFFERENTIAL DIAGNOSIS (includes but not limited to):   ACS, PE, kidney stone, UTI, pyelonephritis, pneumonia, rib fracture, muscle spasm, developing shingles, pancreatitis   Patient's presentation is most consistent with acute presentation with potential threat to life or bodily function.   PLAN: Will obtain CBC, CMP, lipase, urine, troponin x 2, CTA of the chest and CT of the abdomen pelvis given patient's age even though pain has resolved.  Will give IV fluids and continue to closely monitor.  EKG shows no new ischemic change or arrhythmia compared to previous.   MEDICATIONS GIVEN IN ED: Medications  sodium chloride 0.9 % bolus 500 mL (500 mLs Intravenous New Bag/Given 06/30/22 0558)  iohexol (OMNIPAQUE) 350 MG/ML injection 100 mL (100 mLs Intravenous Contrast Given 06/30/22 [id number])     ED COURSE: Patient CT scans reviewed and interpreted by myself and the radiologist and show no acute abnormality.  First troponin negative.  No leukocytosis.  Normal creatinine, LFTs other than minimal total bilirubin elevation at 1.9 which appears chronic.  Normal lipase.  Repeat troponin, urinalysis pending.  Signed out to oncoming EDP.  Anticipate discharge if patient repeat blood work unremarkable and pain is still gone.    CONSULTS: Pending further  workup.   OUTSIDE RECORDS REVIEWED: Reviewed last cardiology note on 02/23/2022.       FINAL CLINICAL IMPRESSION(S) / ED DIAGNOSES   Final diagnoses:  Left-sided chest pain  Left flank pain     Rx / DC Orders   ED Discharge Orders     None        Note:  This document was prepared using Dragon voice recognition software and may include unintentional dictation errors.   Tynisha Ogan, Delice Bison, DO 06/30/22 [phone]

## 2023-01-02 ENCOUNTER — Emergency Department
Admission: EM | Admit: 2023-01-02 | Discharge: 2023-01-02 | Disposition: A | Discharge status: Home / Self Care | Payer: Medicare Other | Attending: Emergency Medicine | Admitting: Emergency Medicine

## 2023-01-02 ENCOUNTER — Emergency Department: Payer: Medicare Other

## 2023-01-02 ENCOUNTER — Other Ambulatory Visit: Payer: Self-pay

## 2023-01-02 DIAGNOSIS — M545 Low back pain, unspecified: Secondary | ICD-10-CM | POA: Insufficient documentation

## 2023-01-02 DIAGNOSIS — S40021A Contusion of right upper arm, initial encounter: Secondary | ICD-10-CM | POA: Insufficient documentation

## 2023-01-02 DIAGNOSIS — F0390 Unspecified dementia without behavioral disturbance: Secondary | ICD-10-CM | POA: Insufficient documentation

## 2023-01-02 DIAGNOSIS — G20C Parkinsonism, unspecified: Secondary | ICD-10-CM | POA: Diagnosis not present

## 2023-01-02 DIAGNOSIS — I110 Hypertensive heart disease with heart failure: Secondary | ICD-10-CM | POA: Insufficient documentation

## 2023-01-02 DIAGNOSIS — I2510 Atherosclerotic heart disease of native coronary artery without angina pectoris: Secondary | ICD-10-CM | POA: Diagnosis not present

## 2023-01-02 DIAGNOSIS — W19XXXA Unspecified fall, initial encounter: Secondary | ICD-10-CM | POA: Diagnosis not present

## 2023-01-02 DIAGNOSIS — S4991XA Unspecified injury of right shoulder and upper arm, initial encounter: Secondary | ICD-10-CM | POA: Diagnosis present

## 2023-01-02 DIAGNOSIS — S40022A Contusion of left upper arm, initial encounter: Secondary | ICD-10-CM | POA: Diagnosis not present

## 2023-01-02 DIAGNOSIS — I509 Heart failure, unspecified: Secondary | ICD-10-CM | POA: Insufficient documentation

## 2023-01-02 NOTE — ED Triage Notes (Signed)
 Refer to First Nurse Note

## 2023-01-02 NOTE — ED Notes (Signed)
Patient's significant other would like for EMS to call the pt's niece to open door to pt's house. Niece contact info: Winona Legato [phone]

## 2023-01-02 NOTE — ED Provider Notes (Signed)
[facility] Emergency Department Provider Note     Event Date/Time   First MD Initiated Contact with Patient 01/02/23 1820     (approximate)   History   Fall   HPI  Marus Woolley is a 89 y.o. male with a history of dementia, CHF, Parkinson's, HTN, CAD on anticoagulation therapy, presents to the ED via EMS from home.  Patient had an unwitnessed mechanical fall earlier today.  He reports tripping while walking to the bathroom.  Denies any outright head injury or LOC but does endorse taking blood thinners.  He does endorse some bruising to his upper arms and elbows related to his blood thinner use.  Patient would also endorse some mild low back pain but cannot pinpoint whether the pain is acute or chronic in nature.  Physical Exam   Triage Vital Signs: ED Triage Vitals  Encounter Vitals Group     BP 01/02/23 1749 (!) 141/107     Systolic BP Percentile --      Diastolic BP Percentile --      Pulse Rate 01/02/23 1749 (!) 102     Resp 01/02/23 1749 18     Temp 01/02/23 1749 98.1 F (36.7 C)     Temp Source 01/02/23 1749 Oral     SpO2 01/02/23 1749 98 %     Weight 01/02/23 1748 148 lb (67.1 kg)     Height 01/02/23 1748 5\' 10"  (1.778 m)     Head Circumference --      Peak Flow --      Pain Score 01/02/23 1750 0     Pain Loc --      Pain Education --      Exclude from Growth Chart --     Most recent vital signs: Vitals:   01/02/23 1749  BP: (!) 141/107  Pulse: (!) 102  Resp: 18  Temp: 98.1 F (36.7 C)  SpO2: 98%    General Awake, no distress. NAD HEENT NCAT. PERRL. EOMI. No rhinorrhea. Mucous membranes are moist.  CV:  Good peripheral perfusion. RRR RESP:  Normal effort. CTA ABD:  No distention.  MSK:  Normal active range of motion of all extremities. SKIN:  Old ecchymosis and bruising noted to the upper extremities bilaterally.   ED Results / Procedures / Treatments   Labs (all labs ordered are listed, but only abnormal  results are displayed) Labs Reviewed - No data to display   EKG   RADIOLOGY  I personally viewed and evaluated these images as part of my medical decision making, as well as reviewing the written report by the radiologist.  ED Provider Interpretation: No acute findings  CT HEAD WO CONTRAST ( )  Result Date: 01/02/2023 CLINICAL DATA:  Head trauma, minor (Age >= 65y), Unwitnessed fall, dementia EXAM: CT HEAD WITHOUT CONTRAST TECHNIQUE: Contiguous axial images were obtained from the base of the skull through the vertex without intravenous contrast. RADIATION DOSE REDUCTION: This exam was performed according to the departmental dose-optimization program which includes automated exposure control, adjustment of the mA and/or kV according to patient size and/or use of iterative reconstruction technique. COMPARISON:  03/25/2022 FINDINGS: Brain: Moderate global parenchymal volume loss, stable since prior examination. Moderate periventricular white matter changes are present, likely reflecting the sequela of small vessel ischemia. Stable mild ventriculomegaly, commensurate with the degree of parenchymal volume loss and in keeping with ex vacuo dilation related to central atrophy. Stable cortical encephalomalacia within the left frontal lobe in keeping  with remote infarct. No acute intracranial hemorrhage or infarct. No abnormal mass effect or midline shift. No abnormal intra or extra-axial mass lesion or fluid collection. Cerebellum unremarkable. Vascular: No hyperdense vessel or unexpected calcification. Skull: Normal. Negative for fracture or focal lesion. Sinuses/Orbits: No acute finding. Other: Mastoid air cells and middle ear cavities are clear. IMPRESSION: 1. No acute intracranial hemorrhage or calvarial fracture. 2. Stable senescent change. 3. Stable remote left frontal lobe infarct. Electronically Signed   By: Helyn Numbers M.D.   On: 01/02/2023 20:02   CT Lumbar Spine Wo Contrast  Result Date:  01/02/2023 CLINICAL DATA:  Unwitnessed fall. History of dementia. Abrasions to elbows. EXAM: CT LUMBAR SPINE WITHOUT CONTRAST TECHNIQUE: Multidetector CT imaging of the lumbar spine was performed without intravenous contrast administration. Multiplanar CT image reconstructions were also generated. RADIATION DOSE REDUCTION: This exam was performed according to the departmental dose-optimization program which includes automated exposure control, adjustment of the mA and/or kV according to patient size and/or use of iterative reconstruction technique. COMPARISON:  CT abdomen and pelvis 06/30/2022 FINDINGS: Segmentation: 5 lumbar type vertebrae. Alignment: No evidence of traumatic malalignment. Vertebrae: Demineralization. No acute fracture. Superior endplate compression of L2 is unchanged from June 30, 2022. Unchanged inferior endplate compression and Schmorl's node of T12. Paraspinal and other soft tissues: Advanced aortic atherosclerotic calcification. 3.4 cm infrarenal abdominal aortic aneurysm, slightly increased from 06/30/2022 when it measured 3.2 cm. Disc levels: Multilevel spondylosis, disc space height loss, degenerative endplate changes greatest at L5-S1 where it is advanced. Moderate multilevel facet arthropathy. Spinal canal narrowing is greatest at L3-L4. Moderate to advanced neural foraminal narrowing on the on the right at L3-L4 and bilaterally at L4-L5. IMPRESSION: 1. No acute fracture in the lumbar spine. 2. 3.4 cm infrarenal abdominal aortic aneurysm increased from February when it measured 3.2 cm. Recommend follow-up ultrasound every 3 years. This recommendation follows ACR consensus guidelines: White Paper of the ACR Incidental Findings Committee II on Vascular Findings. J Am Coll Radiol 2013; 10:789-794. Electronically Signed   By: Minerva Fester M.D.   On: 01/02/2023 19:51     PROCEDURES:  Critical Care performed: No  Procedures   MEDICATIONS ORDERED IN ED: Medications - No data to  display   IMPRESSION / MDM / ASSESSMENT AND PLAN / ED COURSE  I reviewed the triage vital signs and the nursing notes.                              Differential diagnosis includes, but is not limited to, SDH, skull fracture, lumbar compression fracture, lumbar strain, contusions  Patient's presentation is most consistent with acute complicated illness / injury requiring diagnostic workup.  Geriatric patient to the ED for evaluation of injury sustained following mechanical fall.  Patient presents in no acute distress endorsing a fall today, he does endorse blood thinner use but denies any LOC.  He would endorse some low back pain.  Patient is evaluated for his complaints in ED, found to have reassuring exam and workup overall.  Patient's diagnosis is consistent with mechanical fall without evidence of serious closed head injury or subdural hemorrhage.  No CT evidence of any acute fracture or dislocation of the lumbar spine.  Patient will be discharged home with traction to take his home medications. Patient is to follow up with primary provider as needed or otherwise directed. Patient is given ED precautions to return to the ED for any worsening or new symptoms.  FINAL CLINICAL IMPRESSION(S) / ED DIAGNOSES   Final diagnoses:  Fall in home, initial encounter     Rx / DC Orders   ED Discharge Orders     None        Note:  This document was prepared using Dragon voice recognition software and may include unintentional dictation errors.    Lissa Hoard, PA-C 01/02/23 2044    Jene Every, MD 01/02/23 2126

## 2023-01-02 NOTE — Discharge Instructions (Addendum)
Scott Cortez has a normal exam including CT scans of his head and low back.  No evidence of a serious injury related to his fall.

## 2023-01-02 NOTE — ED Notes (Signed)
See triage notes. Patient brought in by [facility] due to an unwitnessed fall. Denies LOC. Patient does take blood thinners.

## 2023-01-02 NOTE — ED Notes (Signed)
First Nurse Note: Patient to ED via [facility] from home after an unwitnessed fall. Pt states he tripped while walking to the bathroom. Denies LOC but does take blood thinners. Hx of dementia. Only states of abrasions to bilateral elbows.

## 2023-01-18 ENCOUNTER — Other Ambulatory Visit: Payer: Self-pay

## 2023-01-18 ENCOUNTER — Emergency Department: Payer: Medicare Other

## 2023-01-18 ENCOUNTER — Inpatient Hospital Stay
Admission: EM | Admit: 2023-01-18 | Discharge: 2023-01-29 | DRG: 178 | Disposition: A | Discharge status: Skilled Nursing Facility | Payer: Medicare Other | Attending: Hospitalist | Admitting: Hospitalist

## 2023-01-18 DIAGNOSIS — F0280 Dementia in other diseases classified elsewhere without behavioral disturbance: Secondary | ICD-10-CM | POA: Diagnosis present

## 2023-01-18 DIAGNOSIS — N401 Enlarged prostate with lower urinary tract symptoms: Secondary | ICD-10-CM | POA: Diagnosis present

## 2023-01-18 DIAGNOSIS — Z955 Presence of coronary angioplasty implant and graft: Secondary | ICD-10-CM

## 2023-01-18 DIAGNOSIS — E876 Hypokalemia: Secondary | ICD-10-CM | POA: Diagnosis present

## 2023-01-18 DIAGNOSIS — B952 Enterococcus as the cause of diseases classified elsewhere: Secondary | ICD-10-CM | POA: Diagnosis present

## 2023-01-18 DIAGNOSIS — D6959 Other secondary thrombocytopenia: Secondary | ICD-10-CM | POA: Diagnosis present

## 2023-01-18 DIAGNOSIS — Z23 Encounter for immunization: Secondary | ICD-10-CM | POA: Diagnosis present

## 2023-01-18 DIAGNOSIS — K861 Other chronic pancreatitis: Secondary | ICD-10-CM | POA: Diagnosis present

## 2023-01-18 DIAGNOSIS — N138 Other obstructive and reflux uropathy: Secondary | ICD-10-CM | POA: Diagnosis present

## 2023-01-18 DIAGNOSIS — I429 Cardiomyopathy, unspecified: Secondary | ICD-10-CM | POA: Diagnosis present

## 2023-01-18 DIAGNOSIS — E785 Hyperlipidemia, unspecified: Secondary | ICD-10-CM | POA: Diagnosis present

## 2023-01-18 DIAGNOSIS — I4891 Unspecified atrial fibrillation: Secondary | ICD-10-CM | POA: Diagnosis present

## 2023-01-18 DIAGNOSIS — I2510 Atherosclerotic heart disease of native coronary artery without angina pectoris: Secondary | ICD-10-CM | POA: Diagnosis present

## 2023-01-18 DIAGNOSIS — R531 Weakness: Secondary | ICD-10-CM | POA: Diagnosis not present

## 2023-01-18 DIAGNOSIS — N390 Urinary tract infection, site not specified: Secondary | ICD-10-CM | POA: Diagnosis present

## 2023-01-18 DIAGNOSIS — I10 Essential (primary) hypertension: Secondary | ICD-10-CM | POA: Diagnosis present

## 2023-01-18 DIAGNOSIS — Z751 Person awaiting admission to adequate facility elsewhere: Secondary | ICD-10-CM

## 2023-01-18 DIAGNOSIS — Z87891 Personal history of nicotine dependence: Secondary | ICD-10-CM

## 2023-01-18 DIAGNOSIS — G2581 Restless legs syndrome: Secondary | ICD-10-CM | POA: Diagnosis present

## 2023-01-18 DIAGNOSIS — K219 Gastro-esophageal reflux disease without esophagitis: Secondary | ICD-10-CM | POA: Diagnosis present

## 2023-01-18 DIAGNOSIS — U071 COVID-19: Secondary | ICD-10-CM | POA: Diagnosis present

## 2023-01-18 DIAGNOSIS — I5032 Chronic diastolic (congestive) heart failure: Secondary | ICD-10-CM | POA: Diagnosis present

## 2023-01-18 DIAGNOSIS — Z7901 Long term (current) use of anticoagulants: Secondary | ICD-10-CM

## 2023-01-18 DIAGNOSIS — I110 Hypertensive heart disease with heart failure: Secondary | ICD-10-CM | POA: Diagnosis present

## 2023-01-18 DIAGNOSIS — Z887 Allergy status to serum and vaccine status: Secondary | ICD-10-CM

## 2023-01-18 DIAGNOSIS — F05 Delirium due to known physiological condition: Secondary | ICD-10-CM | POA: Diagnosis not present

## 2023-01-18 DIAGNOSIS — E871 Hypo-osmolality and hyponatremia: Secondary | ICD-10-CM | POA: Diagnosis present

## 2023-01-18 DIAGNOSIS — Z79899 Other long term (current) drug therapy: Secondary | ICD-10-CM

## 2023-01-18 DIAGNOSIS — G20A1 Parkinson's disease without dyskinesia, without mention of fluctuations: Secondary | ICD-10-CM | POA: Diagnosis present

## 2023-01-18 DIAGNOSIS — B961 Klebsiella pneumoniae [K. pneumoniae] as the cause of diseases classified elsewhere: Secondary | ICD-10-CM | POA: Diagnosis present

## 2023-01-18 DIAGNOSIS — Z66 Do not resuscitate: Secondary | ICD-10-CM | POA: Diagnosis present

## 2023-01-18 DIAGNOSIS — I252 Old myocardial infarction: Secondary | ICD-10-CM

## 2023-01-18 DIAGNOSIS — Z602 Problems related to living alone: Secondary | ICD-10-CM | POA: Diagnosis present

## 2023-01-18 LAB — COMPREHENSIVE METABOLIC PANEL
ALT: 7 U/L (ref 0–44)
AST: 33 U/L (ref 15–41)
Albumin: 3.6 g/dL (ref 3.5–5.0)
Alkaline Phosphatase: 87 U/L (ref 38–126)
Anion gap: 7 (ref 5–15)
BUN: 23 mg/dL (ref 8–23)
CO2: 27 mmol/L (ref 22–32)
Calcium: 8.6 mg/dL — ABNORMAL LOW (ref 8.9–10.3)
Chloride: 100 mmol/L (ref 98–111)
Creatinine, Ser: 0.64 mg/dL (ref 0.61–1.24)
GFR, Estimated: >60 mL/min (ref >60)
Glucose, Bld: 101 mg/dL — ABNORMAL HIGH (ref 70–99)
Potassium: 3.4 mmol/L — ABNORMAL LOW (ref 3.5–5.1)
Sodium: 134 mmol/L — ABNORMAL LOW (ref 135–145)
Total Bilirubin: 1.7 mg/dL — ABNORMAL HIGH (ref 0.3–1.2)
Total Protein: 6.4 g/dL — ABNORMAL LOW (ref 6.5–8.1)

## 2023-01-18 LAB — URINALYSIS, ROUTINE W REFLEX MICROSCOPIC
Bacteria, UA: NONE SEEN
Bilirubin Urine: NEGATIVE
Glucose, UA: NEGATIVE mg/dL
Ketones, ur: 5 mg/dL — AB
Leukocytes,Ua: NEGATIVE
Nitrite: NEGATIVE
Protein, ur: NEGATIVE mg/dL
Specific Gravity, Urine: 1.014 (ref 1.005–1.030)
Squamous Epithelial / HPF: NONE SEEN /HPF (ref 0–5)
WBC, UA: NONE SEEN WBC/hpf (ref 0–5)
pH: 5 (ref 5.0–8.0)

## 2023-01-18 LAB — SARS CORONAVIRUS 2 BY RT PCR: SARS Coronavirus 2 by RT PCR: POSITIVE — AB

## 2023-01-18 LAB — CBC WITH DIFFERENTIAL/PLATELET
Abs Immature Granulocytes: 0.02 10*3/uL (ref 0.00–0.07)
Basophils Absolute: 0 10*3/uL (ref 0.0–0.1)
Basophils Relative: 0 %
Eosinophils Absolute: 0 10*3/uL (ref 0.0–0.5)
Eosinophils Relative: 0 %
HCT: 39.5 % (ref 39.0–52.0)
Hemoglobin: 13.7 g/dL (ref 13.0–17.0)
Immature Granulocytes: 1 %
Lymphocytes Relative: 18 %
Lymphs Abs: 0.7 10*3/uL (ref 0.7–4.0)
MCH: 32.1 pg (ref 26.0–34.0)
MCHC: 34.7 g/dL (ref 30.0–36.0)
MCV: 92.5 fL (ref 80.0–100.0)
Monocytes Absolute: 0.6 10*3/uL (ref 0.1–1.0)
Monocytes Relative: 16 %
Neutro Abs: 2.4 10*3/uL (ref 1.7–7.7)
Neutrophils Relative %: 65 %
Platelets: 147 10*3/uL — ABNORMAL LOW (ref 150–400)
RBC: 4.27 MIL/uL (ref 4.22–5.81)
RDW: 12.8 % (ref 11.5–15.5)
WBC: 3.7 10*3/uL — ABNORMAL LOW (ref 4.0–10.5)
nRBC: 0 % (ref 0.0–0.2)

## 2023-01-18 LAB — LACTIC ACID, PLASMA
Lactic Acid, Venous: 1.2 mmol/L (ref 0.5–1.9)
Lactic Acid, Venous: 1.4 mmol/L (ref 0.5–1.9)

## 2023-01-18 LAB — TROPONIN I (HIGH SENSITIVITY)
Troponin I (High Sensitivity): 12 ng/L (ref <18)
Troponin I (High Sensitivity): 11 ng/L (ref <18)

## 2023-01-18 LAB — BRAIN NATRIURETIC PEPTIDE: B Natriuretic Peptide: 170.3 pg/mL — ABNORMAL HIGH (ref 0.0–100.0)

## 2023-01-18 MED ORDER — APIXABAN 5 MG PO TABS
5.0000 mg | ORAL_TABLET | Freq: Two times a day (BID) | ORAL | Status: DC
  Administered 2023-01-18 – 2023-01-29 (×22): 5 mg via ORAL
  Filled 2023-01-18 (×22): qty 1

## 2023-01-18 MED ORDER — PANTOPRAZOLE SODIUM 40 MG PO TBEC
40.0000 mg | DELAYED_RELEASE_TABLET | Freq: Every day | ORAL | Status: DC
  Administered 2023-01-19 – 2023-01-29 (×11): 40 mg via ORAL
  Filled 2023-01-18 (×11): qty 1

## 2023-01-18 MED ORDER — ACETAMINOPHEN 650 MG RE SUPP: 650.0000 mg | Freq: Four times a day (QID) | RECTAL | Status: DC | PRN

## 2023-01-18 MED ORDER — ONDANSETRON HCL 4 MG/2ML IJ SOLN: 4.0000 mg | Freq: Four times a day (QID) | INTRAMUSCULAR | Status: DC | PRN

## 2023-01-18 MED ORDER — ATORVASTATIN CALCIUM 20 MG PO TABS
20.0000 mg | ORAL_TABLET | Freq: Every day | ORAL | Status: DC
  Administered 2023-01-19 – 2023-01-28 (×10): 20 mg via ORAL
  Filled 2023-01-18 (×11): qty 1

## 2023-01-18 MED ORDER — ZINC SULFATE 220 (50 ZN) MG PO CAPS
220.0000 mg | ORAL_CAPSULE | Freq: Every day | ORAL | Status: DC
  Administered 2023-01-19 – 2023-01-29 (×11): 220 mg via ORAL
  Filled 2023-01-18 (×11): qty 1

## 2023-01-18 MED ORDER — SODIUM CHLORIDE 0.9 % IV BOLUS
1000.0000 mL | Freq: Once | INTRAVENOUS | Status: AC
  Administered 2023-01-18: 1000 mL via INTRAVENOUS

## 2023-01-18 MED ORDER — ACETAMINOPHEN 325 MG PO TABS
650.0000 mg | ORAL_TABLET | Freq: Four times a day (QID) | ORAL | Status: DC | PRN
  Administered 2023-01-19 – 2023-01-29 (×10): 650 mg via ORAL
  Filled 2023-01-18 (×10): qty 2

## 2023-01-18 MED ORDER — DEXAMETHASONE 6 MG PO TABS
6.0000 mg | ORAL_TABLET | Freq: Every day | ORAL | Status: DC
  Administered 2023-01-18 – 2023-01-19 (×2): 6 mg via ORAL
  Filled 2023-01-18 (×2): qty 1

## 2023-01-18 MED ORDER — ADULT MULTIVITAMIN W/MINERALS CH
1.0000 | ORAL_TABLET | Freq: Every day | ORAL | Status: DC
  Administered 2023-01-19 – 2023-01-29 (×11): 1 via ORAL
  Filled 2023-01-18 (×11): qty 1

## 2023-01-18 MED ORDER — INFLUENZA VAC A&B SURF ANT ADJ 0.5 ML IM SUSY
0.5000 mL | PREFILLED_SYRINGE | INTRAMUSCULAR | Status: AC
  Administered 2023-01-19: 0.5 mL via INTRAMUSCULAR
  Filled 2023-01-18: qty 0.5

## 2023-01-18 MED ORDER — CARVEDILOL 3.125 MG PO TABS
6.2500 mg | ORAL_TABLET | Freq: Two times a day (BID) | ORAL | Status: DC
  Administered 2023-01-19 – 2023-01-26 (×15): 6.25 mg via ORAL
  Filled 2023-01-18 (×17): qty 2

## 2023-01-18 MED ORDER — SODIUM CHLORIDE 0.45 % IV SOLN: INTRAVENOUS | Status: DC

## 2023-01-18 MED ORDER — ONDANSETRON HCL 4 MG PO TABS: 4.0000 mg | ORAL_TABLET | Freq: Four times a day (QID) | ORAL | Status: DC | PRN

## 2023-01-18 NOTE — ED Notes (Signed)
Pt gave verbal consent to provide updates to daughter, Caprice Kluver, phone number [phone]. This RN called and provided update to daughter.

## 2023-01-18 NOTE — ED Triage Notes (Signed)
Pt to ED via [facility] from home for c/o weakness. APS called wanting pt evaluated. Pt lives alone. Hx dementia, Parkinson's, on Eliquis.

## 2023-01-18 NOTE — ED Provider Notes (Signed)
[facility] Provider Note    Event Date/Time   First MD Initiated Contact with Patient 01/18/23 1559     (approximate)   History   Weakness   HPI  Scott Cortez is a 89 y.o. male here with generalized weakness.  History somewhat limited due to patient's history of dementia although he provides relatively straightforward history to me.  He states that he has been feeling generally weak for the last several weeks.  He lives alone and has been concerned about his ability to take care of himself.  Over the last few days, he is felt more acutely weak and has had difficulty taking care of the stuff that he normally needs to do at home.  He has had decreased appetite.  He said some mild lightheadedness with standing and ambulation.  No pain.     Physical Exam   Triage Vital Signs: ED Triage Vitals  Encounter Vitals Group     BP 01/18/23 1600 (!) 88/66     Systolic BP Percentile --      Diastolic BP Percentile --      Pulse Rate 01/18/23 1600 (!) 45     Resp 01/18/23 1600 17     Temp 01/18/23 1607 (!) 97.5 F (36.4 C)     Temp Source 01/18/23 1607 Oral     SpO2 01/18/23 1557 97 %     Weight --      Height --      Head Circumference --      Peak Flow --      Pain Score 01/18/23 1600 0     Pain Loc --      Pain Education --      Exclude from Growth Chart --     Most recent vital signs: Vitals:   01/18/23 1645 01/18/23 1730  BP: (!) 85/67 110/84  Pulse:  77  Resp: (!) 22 (!) 21  Temp:    SpO2: 97% 98%     General: Awake, no distress.  CV:  Good peripheral perfusion.  Resp:  Normal work of breathing.  Lungs clear to auscultation bilaterally. Abd:  No distention.  No tenderness. Other:  No lower extremity edema.  Dry mucous membranes.   ED Results / Procedures / Treatments   Labs (all labs ordered are listed, but only abnormal results are displayed) Labs Reviewed  SARS CORONAVIRUS 2 BY RT PCR - Abnormal; Notable for the  following components:      Result Value   SARS Coronavirus 2 by RT PCR POSITIVE (*)    All other components within normal limits  CBC WITH DIFFERENTIAL/PLATELET - Abnormal; Notable for the following components:   WBC 3.7 (*)    Platelets 147 (*)    All other components within normal limits  COMPREHENSIVE METABOLIC PANEL - Abnormal; Notable for the following components:   Sodium 134 (*)    Potassium 3.4 (*)    Glucose, Bld 101 (*)    Calcium 8.6 (*)    Total Protein 6.4 (*)    Total Bilirubin 1.7 (*)    All other components within normal limits  BRAIN NATRIURETIC PEPTIDE - Abnormal; Notable for the following components:   B Natriuretic Peptide 170.3 (*)    All other components within normal limits  LACTIC ACID, PLASMA  LACTIC ACID, PLASMA  URINALYSIS, ROUTINE W REFLEX MICROSCOPIC  TROPONIN I (HIGH SENSITIVITY)  TROPONIN I (HIGH SENSITIVITY)     EKG Atrial fibrillation, ventricular rate 64.  QRS 125, QTc 449.  No acute ST elevations or depressions.  No acute evidence of acute ischemia or.   RADIOLOGY Chest x-ray: No active disease   I also independently reviewed and agree with radiologist interpretations.   PROCEDURES:  Critical Care performed: No   MEDICATIONS ORDERED IN ED: Medications  sodium chloride 0.9 % bolus 1,000 mL (1,000 mLs Intravenous New Bag/Given 01/18/23 1651)     IMPRESSION / MDM / ASSESSMENT AND PLAN / ED COURSE  I reviewed the triage vital signs and the nursing notes.                              Differential diagnosis includes, but is not limited to, generalized weakness due to dehydration, anemia, occult infection such as UTI or pneumonia, ACS  Patient's presentation is most consistent with acute presentation with potential threat to life or bodily function.  The patient is on the cardiac monitor to evaluate for evidence of arrhythmia and/or significant heart rate changes   89 year old male here with generalized weakness.  Patient mildly  dehydrated on exam.  History of dementia but is able to provide history on my assessment.  Mild leukopenia noted.  CMP with mild hypokalemia and slightly elevated bilirubin.  BNP is 170.  EKG nonischemic.  Chest x-ray is clear.  Patient is COVID-positive.  He was persistently hypotensive in the 70s to 80s systolic on arrival.  This is improved with IV fluids.  Will admit for generalized weakness and hypotension in the setting of COVID-19.  May ultimately need SNF placement based on initial report from family and the fact that he lives alone.   FINAL CLINICAL IMPRESSION(S) / ED DIAGNOSES   Final diagnoses:  Generalized weakness  COVID-19     Rx / DC Orders   ED Discharge Orders     None        Note:  This document was prepared using Dragon voice recognition software and may include unintentional dictation errors.   Shaune Pollack, MD 01/18/23 [phone]

## 2023-01-18 NOTE — H&P (Signed)
History and Physical    Patient: Scott Cortez ZOX:[id number] DOB: 12-03-1933 DOA: 01/18/2023 DOS: the patient was seen and examined on 01/18/2023 PCP: [facility]  Patient coming from: Home  Chief Complaint:  Chief Complaint  Patient presents with   Weakness   HPI: Scott Cortez is a 89 y.o. male with medical history significant of coronary artery disease, AAA, BPH, bradycardia, diastolic dysfunction CHF, chronic pancreatitis, depression, GERD, essential hypertension, hyperlipidemia, Parkinson's disease, restless leg syndrome who was brought in from home with significant weakness.  Patient apparently lives alone with his history of dementia and Parkinson's.  He is on chronic anticoagulation with Eliquis.  APS apparently wanted the patient evaluated.  In the ER he was evaluated.  He is a DNR.  He has been weak for several weeks.  Patient has also has decreased appetite lightheadedness and problem with ambulation.  Workup in the ER showed COVID-19 positive.  Patient also has hyponatremia and hypokalemia.  Some thrombocytopenia.  Urinalysis negative.  Chest x-ray showed no acute cardiopulmonary disease.  Patient being admitted with generalized weakness due to COVID-19 infection.  Review of Systems: As mentioned in the history of present illness. All other systems reviewed and are negative. Past Medical History:  Diagnosis Date   AAA (abdominal aortic aneurysm) (HCC)    a.) CTA 12/31/2015: measured 3.1 cm. b.) CTA 03/07/2019: measured 3.5 cm. c.) CT abdomen 05/05/2021: measured  2.8 cm. d.) CTA 05/17/2021: measured 3.2 cm   Acute MI, inferior wall (HCC) 11/19/2003   a.) LHC 11/22/2003: EF 49%; 20% pRCA, 90% mRCA, 20% LM, 20% pLCx, 20% mLAD, 20% D1; unable to perform PCI --> transferred to [facility] and stent (unknown type) was placed to the RCA.   Aortic atherosclerosis (HCC)    Atrial fibrillation (HCC)    a.) CHA2DS2-VASc = 5 (age x 2, CHF, HTN, previous MI/aortic  plaque). b.) rate/hythm maintained on oral carvedilol; chronically anticoagulated using apixaban.   Bladder neck obstruction    BPH (benign prostatic hyperplasia)    Bradycardia    Cardiac murmur    Cardiomyopathy (HCC)    CHF (congestive heart failure) (HCC)    a.) TTE 01/05/2014: EF 50%; mild LA enlargement; triv AR//PR, mild MR/TR; G1DD. b.) TTE 03/08/2019: EF 60-65%; RV mildly enlarged. c.) TTE 09/26/2020: EF 30%; mild LVH; mild LA enlargement; mild MR/TR/PR.   Chronic pancreatitis (HCC)    Coronary artery disease    a.) inferior wall MI 11/19/2003; LHC 11/22/2003: EF 49%; 20% pRCA, 90% mRCA, 20% LM, 20% pLCx, 20% mLAD, 20% D1; unable to perform PCI --> transferred to [facility] and stent (unknown type) was placed to the RCA. b.) MI 2007/2008 (date unknown); PCI placing a second stent (unknown type) to RCA. c.) LHC 02/02/2009: 50% RPLS; intervention deferred opting for med mgmt.   Current use of long term anticoagulation    a.) apixaban   Dental bridge present    upper - bilateral   Depression    Frequent falls    GERD (gastroesophageal reflux disease)    Glaucoma    Hyperlipidemia    Hypertension    Iliac artery aneurysm, left (HCC)    a.)  CTA 05/17/2021: measured 1.7 cm; previously 1.5 cm in 2015.   MI (myocardial infarction) (HCC) 05/02/2007   a.) inferoposterolateral MI 05/02/2007 - PCI placing a stent (unknown type) to the RCA   Neuropathy of both feet    Osteoarthritis    Parkinson's disease    RLS (restless legs syndrome)  a.) takes ropinirole   Past Surgical History:  Procedure Laterality Date   CATARACT EXTRACTION W/PHACO Left 02/02/2019   Procedure: CATARACT EXTRACTION PHACO AND INTRAOCULAR LENS PLACEMENT (IOC) LEFT ISTENT INJ AND ISTENT 01:09.1        11.3%          8.47;  Surgeon: Nevada Crane, MD;  Location: [facility];  Service: Ophthalmology;  Laterality: Left;   CATARACT EXTRACTION W/PHACO Right 02/23/2019   Procedure: CATARACT EXTRACTION PHACO AND  INTRAOCULAR LENS PLACEMENT (IOC) RIGHT ISTENT INJ 01:09.1  14.1%  9.78;  Surgeon: Nevada Crane, MD;  Location: [facility];  Service: Ophthalmology;  Laterality: Right;   COLONOSCOPY N/A 10/15/2014   Procedure: COLONOSCOPY;  Surgeon: Scot Jun, MD;  Location: [facility] ENDOSCOPY;  Service: Endoscopy;  Laterality: N/A;   CORONARY ANGIOPLASTY WITH STENT PLACEMENT Left 11/16/2003   PCI and stent placement to the RCA (unknown type)   CORONARY ANGIOPLASTY WITH STENT PLACEMENT Left 05/02/2007   PCI with stenting of the RCA (unknown type)   ESOPHAGOGASTRODUODENOSCOPY (EGD) WITH PROPOFOL N/A 10/15/2014   Procedure: ESOPHAGOGASTRODUODENOSCOPY (EGD) WITH PROPOFOL;  Surgeon: Scot Jun, MD;  Location: [facility] END[facility]COPY;  Service: Endoscopy;  Laterality: N/A;   INSERTION OF ANTERIOR SEGMENT AQUEOUS DRAINAGE DEVICE (ISTENT) Left 02/02/2019   Procedure: INSERTION OF ANTERIOR SEGMENT AQUEOUS DRAINAGE DEVICE (ISTENT);  Surgeon: Nevada Crane, MD;  Location: [facility];  Service: Ophthalmology;  Laterality: Left;   LEFT HEART CATH AND CORONARY ANGIOGRAPHY Left 02/02/2009   Procedure: LEFT HEART CATH AND CORONARY ANGIOGRAPHY; Location: [facility]; Surgeon: Rudean Hitt, MD   SAVORY DILATION N/A 10/15/2014   Procedure: Gaspar Bidding DILATION;  Surgeon: Scot Jun, MD;  Location: [facility] ENDOSCOPY;  Service: Endoscopy;  Laterality: N/A;   XI ROBOTIC ASSISTED INGUINAL HERNIA REPAIR WITH MESH  07/27/2021   Procedure: XI ROBOTIC ASSISTED INGUINAL HERNIA REPAIR WITH MESH;  Surgeon: Sung Amabile, DO;  Location: [facility] ORS;  Service: General;;   Social History:  reports that he quit smoking about 49 years ago. His smoking use included cigarettes. He has never used smokeless tobacco. He reports that he does not currently use drugs after having used the following drugs: Marijuana. He reports that he does not drink alcohol.  Allergies  Allergen Reactions   Pneumococcal Vaccines Swelling    No  family history on file.  Prior to Admission medications   Medication Sig Start Date End Date Taking? Authorizing Provider  acetaminophen (TYLENOL) 500 MG tablet Take 1,000 mg by mouth every 8 (eight) hours as needed for mild pain.    [provider]  apixaban (ELIQUIS) 5 MG TABS tablet Take 1 tablet (5 mg total) by mouth 2 (two) times daily. 03/12/19   Delfino Lovett, MD  atorvastatin (LIPITOR) 20 MG tablet Take 20 mg by mouth daily.    [provider]  carbidopa-levodopa (SINEMET CR) 50-200 MG tablet Take 1 tablet by mouth at bedtime.    [provider]  carbidopa-levodopa (SINEMET IR) 25-100 MG tablet Take 2 tablets by mouth 4 (four) times daily. At 0800, 1100, 1400, 1700    [provider]  carvedilol (COREG) 6.25 MG tablet Take 1 tablet (6.25 mg total) by mouth 2 (two) times daily with a meal. 03/30/22   Kathlen Mody, MD  feeding supplement (ENSURE ENLIVE / ENSURE PLUS) LIQD Take 237 mLs by mouth 3 (three) times daily between meals. 03/30/22   Kathlen Mody, MD  furosemide (LASIX) 20 MG tablet Take 20 mg by  mouth daily.    [provider]  losartan (COZAAR) 25 MG tablet Take 25 mg by mouth daily in the afternoon. At 1400    [provider]  Multiple Vitamin (MULTIVITAMIN WITH MINERALS) TABS tablet Take 1 tablet by mouth daily. 03/31/22   Kathlen Mody, MD  pantoprazole (PROTONIX) 40 MG tablet Take 1 tablet (40 mg total) by mouth daily. 03/31/22   Kathlen Mody, MD  polyethylene glycol (MIRALAX / GLYCOLAX) 17 g packet Take 17 g by mouth daily as needed for mild constipation.    [provider]  rOPINIRole (REQUIP) 0.25 MG tablet Take 0.25 mg by mouth at bedtime.    [provider]  tamsulosin (FLOMAX) 0.4 MG CAPS capsule Take 1 capsule (0.4 mg total) by mouth daily. Patient taking differently: Take 0.4 mg by mouth daily with supper. At 1700 09/12/20   [name], MD    Physical Exam: Vitals:   01/18/23 1645 01/18/23  1730 01/18/23 1947 01/18/23 2023  BP: (!) 85/67 110/84 122/76 121/77  Pulse:  77 86 60  Resp: (!) 22 (!) 21 19 18   Temp:    98 F (36.7 C)  TempSrc:    Oral  SpO2: 97% 98% 97% 100%   Constitutional: Chronically ill looking no distress, NAD, calm, comfortable Eyes: PERRL, lids and conjunctivae normal ENMT: Mucous membranes are moist. Posterior pharynx clear of any exudate or lesions.Normal dentition.  Neck: normal, supple, no masses, no thyromegaly Respiratory: clear to auscultation bilaterally, no wheezing, no crackles. Normal respiratory effort. No accessory muscle use.  Cardiovascular: Regular rate and rhythm, no murmurs / rubs / gallops. No extremity edema. 2+ pedal pulses. No carotid bruits.  Abdomen: no tenderness, no masses palpated. No hepatosplenomegaly. Bowel sounds positive.  Musculoskeletal: Good range of motion, no joint swelling or tenderness, Skin: no rashes, lesions, ulcers. No induration Neurologic: CN 2-12 grossly intact. Sensation intact, DTR normal. Strength 5/5 in all 4.  Psychiatric: Normal judgment and insight. Alert and oriented x 3. Normal mood  Data Reviewed:  Urinalysis negative.  Sodium 134, potassium 3.4, glucose 101 calcium 8.6.  Total protein 6.4 total bilirubin 1.7.  Platelets 147 and white count 3.7.  Chest x-ray showed no acute findings.  COVID-19 positive  Assessment and Plan:  #1 generalized weakness: Multifactorial.  Most likely the dose secondary to acute COVID-19 infection.  Patient will be admitted.  Hydrate.  I will give him some dexamethasone due to complaint of diarrhea.  No respiratory symptoms.  Continue with other supportive care.  PT and OT consult.  #2 COVID-19 infection: Supportive care only.  And hydration.  #3 thrombocytopenia: Secondary to COVID-19 infection.  Continue to monitor.  #4 atrial fibrillation: Continue Eliquis.  Continue to monitor.  #5 Parkinson disease: Continue home regimen  #6 hypokalemia: Continue to  replete.  #7 BPH: Continue treatment.  #8 chronic diastolic heart failure: Gentle hydration as a result of heart failure.  Otherwise compensated.  #9 coronary artery disease: Stable.  Continue to monitor    Advance Care Planning:   Code Status: Do not attempt resuscitation (DNR) PRE-ARREST INTERVENTIONS DESIRED   Consults: None  Family Communication: No family at bedside  Severity of Illness: The appropriate patient status for this patient is INPATIENT. Inpatient status is judged to be reasonable and necessary in order to provide the required intensity of service to ensure the patient's safety. The patient's presenting symptoms, physical exam findings, and initial radiographic and laboratory data in the context of their chronic comorbidities is  felt to place them at high risk for further clinical deterioration. Furthermore, it is not anticipated that the patient will be medically stable for discharge from the hospital within 2 midnights of admission.   * I certify that at the point of admission it is my clinical judgment that the patient will require inpatient hospital care spanning beyond 2 midnights from the point of admission due to high intensity of service, high risk for further deterioration and high frequency of surveillance required.*  AuthorLonia Blood, MD 01/18/2023 8:40 PM  For on call review [email or web address].

## 2023-01-18 NOTE — ED Notes (Signed)
EMS called by Crystal [phone]) with APS.  Juventino Slovak [phone]), to pick up patient when ready for discharge.

## 2023-01-19 DIAGNOSIS — G20A1 Parkinson's disease without dyskinesia, without mention of fluctuations: Secondary | ICD-10-CM | POA: Diagnosis not present

## 2023-01-19 DIAGNOSIS — R531 Weakness: Secondary | ICD-10-CM | POA: Diagnosis not present

## 2023-01-19 DIAGNOSIS — U071 COVID-19: Secondary | ICD-10-CM | POA: Diagnosis not present

## 2023-01-19 DIAGNOSIS — I10 Essential (primary) hypertension: Secondary | ICD-10-CM | POA: Diagnosis not present

## 2023-01-19 LAB — CBC
HCT: 39.8 % (ref 39.0–52.0)
Hemoglobin: 13.5 g/dL (ref 13.0–17.0)
MCH: 31.9 pg (ref 26.0–34.0)
MCHC: 33.9 g/dL (ref 30.0–36.0)
MCV: 94.1 fL (ref 80.0–100.0)
Platelets: 142 10*3/uL — ABNORMAL LOW (ref 150–400)
RBC: 4.23 MIL/uL (ref 4.22–5.81)
RDW: 12.6 % (ref 11.5–15.5)
WBC: 2.3 10*3/uL — ABNORMAL LOW (ref 4.0–10.5)
nRBC: 0 % (ref 0.0–0.2)

## 2023-01-19 LAB — COMPREHENSIVE METABOLIC PANEL
ALT: 21 U/L (ref 0–44)
AST: 32 U/L (ref 15–41)
Albumin: 3.2 g/dL — ABNORMAL LOW (ref 3.5–5.0)
Alkaline Phosphatase: 90 U/L (ref 38–126)
Anion gap: 8 (ref 5–15)
BUN: 20 mg/dL (ref 8–23)
CO2: 26 mmol/L (ref 22–32)
Calcium: 8.5 mg/dL — ABNORMAL LOW (ref 8.9–10.3)
Chloride: 100 mmol/L (ref 98–111)
Creatinine, Ser: 0.63 mg/dL (ref 0.61–1.24)
GFR, Estimated: >60 mL/min (ref >60)
Glucose, Bld: 119 mg/dL — ABNORMAL HIGH (ref 70–99)
Potassium: 3.8 mmol/L (ref 3.5–5.1)
Sodium: 134 mmol/L — ABNORMAL LOW (ref 135–145)
Total Bilirubin: 1.6 mg/dL — ABNORMAL HIGH (ref 0.3–1.2)
Total Protein: 5.8 g/dL — ABNORMAL LOW (ref 6.5–8.1)

## 2023-01-19 LAB — C-REACTIVE PROTEIN: CRP: 2.3 mg/dL — ABNORMAL HIGH (ref <1.0)

## 2023-01-19 MED ORDER — ENSURE ENLIVE PO LIQD
237.0000 mL | Freq: Three times a day (TID) | ORAL | Status: DC
  Administered 2023-01-19 – 2023-01-29 (×26): 237 mL via ORAL

## 2023-01-19 MED ORDER — ADULT MULTIVITAMIN W/MINERALS CH: 1.0000 | ORAL_TABLET | Freq: Every day | ORAL | Status: DC

## 2023-01-19 MED ORDER — TAMSULOSIN HCL 0.4 MG PO CAPS
0.4000 mg | ORAL_CAPSULE | Freq: Every day | ORAL | Status: DC
  Administered 2023-01-19 – 2023-01-28 (×10): 0.4 mg via ORAL
  Filled 2023-01-19 (×10): qty 1

## 2023-01-19 MED ORDER — ROPINIROLE HCL 0.25 MG PO TABS
0.2500 mg | ORAL_TABLET | Freq: Every day | ORAL | Status: DC
  Administered 2023-01-19 – 2023-01-28 (×10): 0.25 mg via ORAL
  Filled 2023-01-19 (×10): qty 1

## 2023-01-19 MED ORDER — CARBIDOPA-LEVODOPA 25-100 MG PO TABS
2.0000 | ORAL_TABLET | Freq: Four times a day (QID) | ORAL | Status: DC
  Administered 2023-01-19 – 2023-01-29 (×38): 2 via ORAL
  Filled 2023-01-19 (×37): qty 2

## 2023-01-19 NOTE — Assessment & Plan Note (Signed)
No chest pain. -Continue home statin and carvedilol

## 2023-01-19 NOTE — TOC Initial Note (Signed)
Transition of Care [facility]) - Initial/Assessment Note    Patient Details  Name: Scott Cortez MRN: [id number] Date of Birth: 09/05/1933  Transition of Care [facility]) CM/SW Contact:    Kemper Durie, RN Phone Number: 01/19/2023, 4:24 PM  Clinical Narrative:                  Patient admitted from home alone, but has friends and family that are supportive.  Daughter Selena Batten lives in [place], but remains supportive and will be in town on Monday.  PCP is at [facility], uses [facility] for medications.  Has walker and BSC.  Daughter aware of recommendations for SNF, agrees but would like for patient to make final decision.  Attempted to speak with patient directly, not available.  TOC will continue to follow for discharge planning.   Expected Discharge Plan: Skilled Nursing Facility Barriers to Discharge: Continued Medical Work up   Patient Goals and CMS Choice Patient states their goals for this hospitalization and ongoing recovery are:: SNF for short term rehab then home          Expected Discharge Plan and Services     Post Acute Care Choice: Skilled Nursing Facility Living arrangements for the past 2 months: Single Family Home                                      Prior Living Arrangements/Services Living arrangements for the past 2 months: Single Family Home Lives with:: Self              Current home services: DME    Activities of Daily Living Home Assistive Devices/Equipment: Environmental consultant (specify type) ADL Screening (condition at time of admission) Patient's cognitive ability adequate to safely complete daily activities?: Yes Is the patient deaf or have difficulty hearing?: No Does the patient have difficulty seeing, even when wearing glasses/contacts?: No Does the patient have difficulty concentrating, remembering, or making decisions?: Yes Patient able to express need for assistance with ADLs?: Yes Does the patient have difficulty dressing or bathing?:  No Independently performs ADLs?: Yes (appropriate for developmental age) Does the patient have difficulty walking or climbing stairs?: Yes Weakness of Legs: Both Weakness of Arms/Hands: None  Permission Sought/Granted                  Emotional Assessment              Admission diagnosis:  General weakness [R53.1] Generalized weakness [R53.1] COVID-19 [U07.1] Patient Active Problem List   Diagnosis Date Noted   General weakness 01/18/2023   COVID-19 virus infection 01/18/2023   Hypokalemia 01/18/2023   Dizziness 03/27/2022   Parkinson disease 03/27/2022   Fall 03/26/2022   Physical deconditioning 03/26/2022   CAD (coronary artery disease) 03/25/2022   Hypertension 03/25/2022   Falls 03/25/2022   Thrombocytopenia (HCC) 03/25/2022   Atrial fibrillation (HCC) 03/25/2022   Chronic diastolic (congestive) heart failure (HCC) 03/25/2022   Right groin pain 05/17/2021   Near syncope 03/07/2019   Nocturia 06/26/2016   Lower abdominal pain 09/04/2015   Erectile dysfunction 08/30/2014   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:   [facility] - [place], [place] - [address] [address] [place] [address]  Phone: [phone] Fax: [phone]  [facility] - [place], [place] - [address] [place] [address] Phone: [phone] Fax: [phone]  [id number] [place], [place] - [address] AT [facility] [address] [place] [address] Phone: [phone] Fax: [phone]     Social Determinants of Health (SDOH) Social History: SDOH Screenings   Food Insecurity: No Food Insecurity (01/18/2023)  Housing: Low Risk  (01/18/2023)  Transportation Needs: Unmet Transportation  Needs (01/18/2023)  Utilities: Not At Risk (01/18/2023)  Tobacco Use: Medium Risk (08/20/2022)   Received from [facility], [facility]   SDOH Interventions:     Readmission Risk Interventions     No data to display

## 2023-01-19 NOTE — Evaluation (Signed)
Occupational Therapy Evaluation Patient Details Name: Scott Cortez MRN: [id number] DOB: 05/27/1933 Today's Date: 01/19/2023   History of Present Illness Patient is a 89 year old male admitted from home with significant weakness, falls. History of dementia, Parkinson's. COVID +   Clinical Impression   Scott Cortez presents this day with generalized weakness, limited endurance, imbalance, cognitive impairment, and confusion. He is unable to provide a clear history of his living situation and PLOF, but indicates he lives alone, uses a RW, and perhaps has occasional help within his home, although cannot say what his helper(s) does for him or how often they come by. Pt is aware he has Covid and asks for a mask, but then attempts repeatedly to put it on his forehead. He requires frequent cueing to attend to task and needs Min-Mod A for bed mobility, transfers, and ambulation. He reports mild tingling/burning in b/l feet. Recommend ongoing OT while pt is hospitalized. Given pt's level of confusion + need for physical assistance, anticipate that he will need an increased level of support and supervision post discharge.     If plan is discharge home, recommend the following: A little help with walking and/or transfers;A little help with bathing/dressing/bathroom;Assistance with cooking/housework;Direct supervision/assist for financial management;Supervision due to cognitive status;Assist for transportation;Direct supervision/assist for medications management;Help with stairs or ramp for entrance    Functional Status Assessment  Patient has had a recent decline in their functional status and demonstrates the ability to make significant improvements in function in a reasonable and predictable amount of time.  Equipment Recommendations  None recommended by OT    Recommendations for Other Services       Precautions / Restrictions Precautions Precautions: Fall Restrictions Weight Bearing  Restrictions: No      Mobility Bed Mobility Overal bed mobility: Needs Assistance Bed Mobility: Supine to Sit, Sit to Supine     Supine to sit: Mod assist Sit to supine: Mod assist   General bed mobility comments: multimodal cueing for attention to task, pt becomes distracted easily    Transfers Overall transfer level: Needs assistance Equipment used: Rolling walker (2 wheels) Transfers: Sit to/from Stand Sit to Stand: Min assist, From elevated surface           General transfer comment: increased time and effort, multimodal cueing for task initiation and sequencing and for safe use of RW      Balance Overall balance assessment: Needs assistance, History of Falls Sitting-balance support: Feet supported Sitting balance-Leahy Scale: Fair       Standing balance-Leahy Scale: Poor                             ADL either performed or assessed with clinical judgement   ADL Overall ADL's : Needs assistance/impaired Eating/Feeding: Modified independent   Grooming: Wash/dry hands;Wash/dry face;Sitting;Minimal assistance Grooming Details (indicate cue type and reason): Requires ongoing cueing for attention to task                                     Vision         Perception         Praxis         Pertinent Vitals/Pain Pain Assessment Pain Assessment: Faces Faces Pain Scale: Hurts a little bit Pain Location: b/l toes Pain Descriptors / Indicators: Burning Pain Intervention(s): Repositioned, Limited activity within patient's tolerance  Extremity/Trunk Assessment Upper Extremity Assessment Upper Extremity Assessment: Generalized weakness   Lower Extremity Assessment Lower Extremity Assessment: Generalized weakness (Pt reports he has neuropathy and that his toes are "burning" at present.)       Communication Communication Communication: Difficulty following commands/understanding Following commands: Follows one step  commands inconsistently   Cognition Arousal: Alert Behavior During Therapy: WFL for tasks assessed/performed Overall Cognitive Status: History of cognitive impairments - at baseline                                 General Comments: Pt oriented to self, date; not oriented to place, situation. Asked where he is currently, he states he is "at an apt complex that has a pharmacy built in." When given a mask, he repeatedly tries to put it on his forehead.     General Comments       Exercises     Shoulder Instructions      Home Living Family/patient expects to be discharged to:: Private residence Living Arrangements: Alone Available Help at Discharge: Family;Friend(s);Personal care attendant Type of Home: House Home Access: Stairs to enter Entrance Stairs-Number of Steps: 5 Entrance Stairs-Rails: Right Home Layout: One level     Bathroom Shower/Tub: Producer, television/film/video: Handicapped height     Home Equipment: Agricultural consultant (2 wheels)   Additional Comments: patient reports he has someone come in the home to clean 3 days per week (but then reports this person helped him get out of bed and helped when he fell). has a significant other but that person does not come by daily      Prior Functioning/Environment Prior Level of Function : Needs assist             Mobility Comments: patient reports using rolling walker for household ambulation. he can "walk 30 steps" without using a device. history of falls ADLs Comments: Pt reports he is able to perform ADLs himself, although unclear how accurate his description is        OT Problem List: Decreased strength;Decreased activity tolerance;Impaired balance (sitting and/or standing);Decreased safety awareness;Decreased cognition;Decreased knowledge of use of DME or AE      OT Treatment/Interventions: Self-care/ADL training;Therapeutic exercise;Patient/family education;Balance training;Therapeutic  activities;DME and/or AE instruction;Cognitive remediation/compensation    OT Goals(Current goals can be found in the care plan section) Acute Rehab OT Goals Patient Stated Goal: to be able to make his own decision about where he wants to go next OT Goal Formulation: With patient Time For Goal Achievement: 02/02/23 Potential to Achieve Goals: Good ADL Goals Pt Will Perform Grooming: standing;with supervision Pt Will Perform Lower Body Bathing: sitting/lateral leans;with supervision Pt Will Transfer to Toilet: with min assist;ambulating;stand pivot transfer;regular height toilet  OT Frequency: Min 1X/week    Co-evaluation              AM-PAC OT "6 Clicks" Daily Activity     Outcome Measure Help from another person eating meals?: None Help from another person taking care of personal grooming?: A Little Help from another person toileting, which includes using toliet, bedpan, or urinal?: A Lot Help from another person bathing (including washing, rinsing, drying)?: A Lot Help from another person to put on and taking off regular upper body clothing?: A Little Help from another person to put on and taking off regular lower body clothing?: A Lot 6 Click Score: 16   End of Session Equipment Utilized During  Treatment: Rolling walker (2 wheels)  Activity Tolerance: Patient tolerated treatment well Patient left: in bed;with call bell/phone within reach;with bed alarm set  OT Visit Diagnosis: Unsteadiness on feet (R26.81);Other abnormalities of gait and mobility (R26.89);Muscle weakness (generalized) (M62.81);History of falling (Z91.81);Other symptoms and signs involving cognitive function                Time: 1535-1550 OT Time Calculation (min): 15 min Charges:  OT General Charges $OT Visit: 1 Visit OT Evaluation $OT Eval Low Complexity: 1 Low Latina Craver, PhD, MS, OTR/L 01/19/23, 4:08 PM

## 2023-01-19 NOTE — Evaluation (Signed)
Physical Therapy Evaluation Patient Details Name: Scott Cortez MRN: [id number] DOB: 1934-04-07 Today's Date: 01/19/2023  History of Present Illness  Patient is a 89 year old male admitted from home with significant weakness, falls. History of dementia, Parkinson's. COVID +  Clinical Impression  Patient is agreeable to PT. He reports he lives alone and has a history of falls. He is ambulatory with rolling walker at baseline. He reports having someone come 3 days a week to clean his home and has a significant other that checks on him as well. He has 5 steps to enter his home.  Today the patient required physical assistance for bed mobility and transfers. He ambulated a short distance with steadying assistance using rolling walker. Standing and supine therapeutic exercises performed for strengthening. Patient seems to have decreased awareness of need for physical assistance at times. As the patient is requiring physical assistance with mobility and lives alone, anticipated patient could benefit from rehab <3 hours per day after this hospital stay. Recommend PT follow up to maximize independence and decrease caregiver burden.       If plan is discharge home, recommend the following: A little help with walking and/or transfers;A little help with bathing/dressing/bathroom;Assistance with cooking/housework;Assist for transportation;Help with stairs or ramp for entrance   Can travel by private vehicle   Yes    Equipment Recommendations None recommended by PT  Recommendations for Other Services       Functional Status Assessment Patient has had a recent decline in their functional status and demonstrates the ability to make significant improvements in function in a reasonable and predictable amount of time.     Precautions / Restrictions Precautions Precautions: Fall Restrictions Weight Bearing Restrictions: No      Mobility  Bed Mobility Overal bed mobility: Needs Assistance Bed  Mobility: Supine to Sit, Sit to Supine     Supine to sit: Min assist Sit to supine: Mod assist   General bed mobility comments: verbal cues for technique and sequencing    Transfers Overall transfer level: Needs assistance Equipment used: Rolling walker (2 wheels) Transfers: Sit to/from Stand Sit to Stand: Min assist           General transfer comment: lifting assistance required for standing. verbal cues for hand placement. increased time and effort required with all mobility    Ambulation/Gait Ambulation/Gait assistance: Min assist Gait Distance (Feet): 5 Feet Assistive device: Rolling walker (2 wheels) Gait Pattern/deviations: Wide base of support, Step-through pattern Gait velocity: decreased     General Gait Details: verbal cues for technique, steadying assistance required  Stairs            Wheelchair Mobility     Tilt Bed    Modified Rankin (Stroke Patients Only)       Balance Overall balance assessment: Needs assistance, History of Falls Sitting-balance support: Feet supported Sitting balance-Leahy Scale: Fair     Standing balance support: Bilateral upper extremity supported, Reliant on assistive device for balance Standing balance-Leahy Scale: Poor Standing balance comment: external support required to maintain standing balance with increased independence with increased standing time and increasing base of support                             Pertinent Vitals/Pain Pain Assessment Pain Assessment: No/denies pain    Home Living Family/patient expects to be discharged to:: Private residence Living Arrangements: Alone Available Help at Discharge: Family;Friend(s);Personal care attendant Type of Home: House  Home Access: Stairs to enter Entrance Stairs-Rails: Right Entrance Stairs-Number of Steps: 5   Home Layout: One level Home Equipment: Agricultural consultant (2 wheels) Additional Comments: patient reports he has someone come in the  home to clean 3 days per week (but then reports this person helped him get out of bed and helped when he fell). has a significant other but that person does not come by daily    Prior Function Prior Level of Function : Independent/Modified Independent;History of Falls (last six months);Patient poor historian/Family not available             Mobility Comments: patient reports using rolling walker for household ambulation. he can "walk 30 steps" without using a device. history of falls ADLs Comments: Mod I using shower chair for bathing per patient report     Extremity/Trunk Assessment   Upper Extremity Assessment Upper Extremity Assessment: Generalized weakness    Lower Extremity Assessment Lower Extremity Assessment: Generalized weakness       Communication   Communication Communication: No apparent difficulties  Cognition Arousal: Alert Behavior During Therapy: WFL for tasks assessed/performed Overall Cognitive Status: History of cognitive impairments - at baseline                                 General Comments: patient like to do things a certian way and does not accept recommendations easily.  increased time required to complete all tasks. cues for safety and patient tends to overestimate his abilities. verbose        General Comments      Exercises General Exercises - Lower Extremity Straight Leg Raises: AROM, Strengthening, Right, 15 reps, Supine Hip Flexion/Marching: Strengthening, 15 reps, AROM, Both, Standing Mini-Sqauts: AROM, Strengthening, Other reps (comment), Standing (25 reps while holding on to the counter, CGA- SBA for safety) Other Exercises Other Exercises: safety cues provided with exercise. patient demonstrating exercises he has performed with past home health PT   Assessment/Plan    PT Assessment Patient needs continued PT services  PT Problem List Decreased strength;Decreased activity tolerance;Decreased balance;Decreased  coordination;Decreased safety awareness;Decreased cognition       PT Treatment Interventions DME instruction;Gait training;Stair training;Functional mobility training;Therapeutic exercise;Therapeutic activities;Neuromuscular re-education;Balance training;Cognitive remediation;Patient/family education    PT Goals (Current goals can be found in the Care Plan section)  Acute Rehab PT Goals Patient Stated Goal: to be able to walk PT Goal Formulation: With patient Time For Goal Achievement: 02/02/23 Potential to Achieve Goals: Good    Frequency Min 1X/week     Co-evaluation               AM-PAC PT "6 Clicks" Mobility  Outcome Measure Help needed turning from your back to your side while in a flat bed without using bedrails?: A Little Help needed moving from lying on your back to sitting on the side of a flat bed without using bedrails?: A Little Help needed moving to and from a bed to a chair (including a wheelchair)?: A Little Help needed standing up from a chair using your arms (e.g., wheelchair or bedside chair)?: A Little Help needed to walk in hospital room?: A Lot Help needed climbing 3-5 steps with a railing? : A Lot 6 Click Score: 16    End of Session   Activity Tolerance: Patient tolerated treatment well Patient left: in bed;with call bell/phone within reach;with bed alarm set;with nursing/sitter in room Nurse Communication: Mobility status PT Visit Diagnosis:  Muscle weakness (generalized) (M62.81);Unsteadiness on feet (R26.81)    Time: 4098-1191 PT Time Calculation (min) (ACUTE ONLY): 45 min   Charges:   PT Evaluation $PT Eval Low Complexity: 1 Low PT Treatments $Therapeutic Exercise: 8-22 mins $Therapeutic Activity: 8-22 mins PT General Charges $$ ACUTE PT VISIT: 1 Visit         Donna Bernard, PT, MPT   [name] 01/19/2023, 2:32 PM

## 2023-01-19 NOTE — Assessment & Plan Note (Signed)
Supportive care for now.

## 2023-01-19 NOTE — Assessment & Plan Note (Signed)
Likely multifactorial with his advanced Parkinson's and recent COVID infection.  Only had 1 day of diarrhea which has been improved.  No respiratory symptoms. -Supportive care -PT/OT evaluation

## 2023-01-19 NOTE — Assessment & Plan Note (Signed)
-

## 2023-01-19 NOTE — Plan of Care (Signed)
  Problem: Health Behavior/Discharge Planning: Goal: Ability to manage health-related needs will improve Outcome: Progressing   Problem: Clinical Measurements: Goal: Ability to maintain clinical measurements within normal limits will improve Outcome: Progressing Goal: Diagnostic test results will improve Outcome: Progressing Goal: Respiratory complications will improve Outcome: Progressing Goal: Cardiovascular complication will be avoided Outcome: Progressing   Problem: Activity: Goal: Risk for activity intolerance will decrease Outcome: Progressing   Problem: Nutrition: Goal: Adequate nutrition will be maintained Outcome: Progressing   Problem: Elimination: Goal: Will not experience complications related to bowel motility Outcome: Progressing Goal: Will not experience complications related to urinary retention Outcome: Progressing

## 2023-01-19 NOTE — Assessment & Plan Note (Signed)
Blood pressure currently within goal. -Home losartan and Lasix is being held due to initial softer blood pressure -Continue to monitor and resume home meds as needed

## 2023-01-19 NOTE — Progress Notes (Signed)
  Progress Note   Patient: Scott Cortez ZOX:[id number] DOB: [birth year] DOA: 01/18/2023     1 DOS: the patient was seen and examined on 01/19/2023   Brief hospital course: Taken from H&P.   Bodhi Siew is a 89 y.o. male with medical history significant of coronary artery disease, AAA, BPH, bradycardia, diastolic dysfunction CHF, chronic pancreatitis, depression, GERD, essential hypertension, hyperlipidemia, Parkinson's disease, restless leg syndrome who was brought in from home with significant weakness.  Patient apparently lives alone with his history of dementia and Parkinson's. He is on chronic anticoagulation with Eliquis. APS apparently wanted the patient evaluated.   Vital stable on presentation, COVID-19 PCR came back positive.  Labs with hyponatremia , hypokalemia and mild thrombocytopenia. Chest x-ray negative for any acute abnormality.  9/7: Vital stable, on room air, labs with some leukopenia secondary to COVID infection, sodium 134, hypokalemia resolved, T. bili at 1.6, mildly elevated chronically. Pending PT and OT evaluation.   Assessment and Plan: * General weakness Likely multifactorial with his advanced Parkinson's and recent COVID infection.  Only had 1 day of diarrhea which has been improved.  No respiratory symptoms. -Supportive care -PT/OT evaluation  COVID-19 virus infection Supportive care for now.  Parkinson disease -Continue home Sinemet  Hypertension Blood pressure currently within goal. -Home losartan and Lasix is being held due to initial softer blood pressure -Continue to monitor and resume home meds as needed  Chronic diastolic (congestive) heart failure (HCC) Clinically appears well compensated.  BNP at 170 -Monitor volume status  CAD (coronary artery disease) No chest pain. -Continue home statin and carvedilol  Atrial fibrillation (HCC) -Continue home Eliquis and carvedilol  Hypokalemia Resolved with repletion. -Continue  to monitor and replete as needed  BPH with obstruction/lower urinary tract symptoms -Continue home Flomax   Subjective: Patient was seen and examined today.  Denies any shortness of breath or pain.  Physical Exam: Vitals:   01/18/23 1947 01/18/23 2023 01/19/23 0022 01/19/23 0805  BP: 122/76 121/77 123/80 120/88  Pulse: 86 60 75 (!) 107  Resp: 19 18 16 14   Temp:  98 F (36.7 C) 98.2 F (36.8 C) 97.9 F (36.6 C)  TempSrc:  Oral    SpO2: 97% 100% 99% 98%   General.  Frail elderly man, in no acute distress. Pulmonary.  Lungs clear bilaterally, normal respiratory effort. CV.  Regular rate and rhythm, no JVD, rub or murmur. Abdomen.  Soft, nontender, nondistended, BS positive. CNS.  Alert and oriented x 2.  No focal neurologic deficit. Extremities.  No edema, no cyanosis, pulses intact and symmetrical. Psychiatry.  Appears to have some cognitive impairment  Data Reviewed: Prior data reviewed.  Family Communication: Talked with daughter on phone.  Disposition: Status is: Inpatient Remains inpatient appropriate because: Severity of illness  Planned Discharge Destination:  To be determined  DVT prophylaxis.  Eliquis Time spent: 45 minutes  This record has been created using Conservation officer, historic buildings. Errors have been sought and corrected,but may not always be located. Such creation errors do not reflect on the standard of care.   Author: Arnetha Courser, MD 01/19/2023 1:23 PM  For on call review [email or web address].

## 2023-01-19 NOTE — Hospital Course (Addendum)
Taken from H&P.   Scott Cortez is a 89 y.o. male with medical history significant of coronary artery disease, AAA, BPH, bradycardia, diastolic dysfunction CHF, chronic pancreatitis, depression, GERD, essential hypertension, hyperlipidemia, Parkinson's disease, restless leg syndrome who was brought in from home with significant weakness.  Patient apparently lives alone with his history of dementia and Parkinson's. He is on chronic anticoagulation with Eliquis. APS apparently wanted the patient evaluated.   Vital stable on presentation, COVID-19 PCR came back positive.  Labs with hyponatremia , hypokalemia and mild thrombocytopenia. Chest x-ray negative for any acute abnormality.  9/7: Vital stable, on room air, labs with some leukopenia secondary to COVID infection, sodium 134, hypokalemia resolved, T. bili at 1.6, mildly elevated chronically. Pending PT and OT evaluation.  9/8: PT and OT recommending SNF.  Medically stable now  9/9: Remained hemodynamically stable.  Unfortunately will not be able to go to rehab until 01/29/2023 as he need to complete 10-day quarantine.  9/10: Remained stable-going to SNF on 9/17

## 2023-01-19 NOTE — Assessment & Plan Note (Signed)
Resolved with repletion. -Continue to monitor and replete as needed

## 2023-01-19 NOTE — Assessment & Plan Note (Addendum)
Clinically appears well compensated.  BNP at 170 -Monitor volume status

## 2023-01-20 DIAGNOSIS — N401 Enlarged prostate with lower urinary tract symptoms: Secondary | ICD-10-CM

## 2023-01-20 DIAGNOSIS — I10 Essential (primary) hypertension: Secondary | ICD-10-CM | POA: Diagnosis not present

## 2023-01-20 DIAGNOSIS — N138 Other obstructive and reflux uropathy: Secondary | ICD-10-CM

## 2023-01-20 DIAGNOSIS — G20A1 Parkinson's disease without dyskinesia, without mention of fluctuations: Secondary | ICD-10-CM

## 2023-01-20 DIAGNOSIS — I5032 Chronic diastolic (congestive) heart failure: Secondary | ICD-10-CM

## 2023-01-20 DIAGNOSIS — U071 COVID-19: Secondary | ICD-10-CM | POA: Diagnosis not present

## 2023-01-20 DIAGNOSIS — R531 Weakness: Secondary | ICD-10-CM

## 2023-01-20 NOTE — Assessment & Plan Note (Signed)
Likely multifactorial with his advanced Parkinson's and recent COVID infection.  Only had 1 day of diarrhea which has been improved.  No respiratory symptoms. -Supportive care -PT/OT evaluation-recommending SNF -TOC consult

## 2023-01-20 NOTE — Plan of Care (Signed)

## 2023-01-20 NOTE — Plan of Care (Signed)
  Problem: Clinical Measurements: Goal: Ability to maintain clinical measurements within normal limits will improve Outcome: Progressing Goal: Diagnostic test results will improve Outcome: Progressing Goal: Respiratory complications will improve Outcome: Progressing Goal: Cardiovascular complication will be avoided Outcome: Progressing   Problem: Activity: Goal: Risk for activity intolerance will decrease Outcome: Progressing   Problem: Nutrition: Goal: Adequate nutrition will be maintained Outcome: Progressing   Problem: Coping: Goal: Level of anxiety will decrease Outcome: Progressing   Problem: Elimination: Goal: Will not experience complications related to bowel motility Outcome: Progressing Goal: Will not experience complications related to urinary retention Outcome: Progressing   Problem: Safety: Goal: Ability to remain free from injury will improve Outcome: Progressing

## 2023-01-20 NOTE — TOC Progression Note (Signed)
Transition of Care [facility]) - Progression Note    Patient Details  Name: Derone Moustafa MRN: [id number] Date of Birth: 04/03/1934  [facility]) CM/SW Contact  Susa Simmonds, [place] Phone Number: 01/20/2023, 2:07 PM  Clinical Narrative: CSW spoke with patient who stated he is 50/50 in regards to going to a SNF. Patient stated his daughter will at the hospital tomorrow and he would like to speak with her first before making a decision. CSW contacted patients daughter Cala Bradford [phone]. Cala Bradford confirmed she would be at the hospital tomorrow to discuss SNF options with her father. CSW will send off SNF referrals in case patient agrees to SNF.     Expected Discharge Plan: Skilled Nursing Facility Barriers to Discharge: Continued Medical Work up  Expected Discharge Plan and Services     Post Acute Care Choice: Skilled Nursing Facility Living arrangements for the past 2 months: Single Family Home                                       Social Determinants of Health (SDOH) Interventions SDOH Screenings   Food Insecurity: No Food Insecurity (01/18/2023)  Housing: Low Risk  (01/18/2023)  Transportation Needs: Unmet Transportation Needs (01/18/2023)  Utilities: Not At Risk (01/18/2023)  Tobacco Use: Medium Risk (08/20/2022)   Received from [facility], [facility]    Readmission Risk Interventions     No data to display

## 2023-01-20 NOTE — Assessment & Plan Note (Signed)
Blood pressure currently within goal. -Home losartan and Lasix is being held due to initial softer blood pressure -Continue to monitor and resume home meds as needed

## 2023-01-20 NOTE — [place] FL2 (Signed)
MEDICAID FL2 LEVEL OF CARE FORM     IDENTIFICATION  Patient Name: Scott Cortez Birthdate: 1934-02-06 Sex: male Admission Date (Current Location): 01/18/2023  [place] and [place] Number:  Chiropodist and Address:  [facility], [address], [place], [place] [address]      Provider Number: [id number]  Attending Physician Name and Address:  Arnetha Courser, MD  Relative Name and Phone Number:  Caprice Kluver (Daughter)  [phone]    Current Level of Care: Hospital Recommended Level of Care: Skilled Nursing Facility Prior Approval Number:    Date Approved/Denied:   PASRR Number: [id number]  Discharge Plan: SNF    Current Diagnoses: Patient Active Problem List   Diagnosis Date Noted   General weakness 01/18/2023   COVID-19 virus infection 01/18/2023   Hypokalemia 01/18/2023   Dizziness 03/27/2022   Parkinson disease 03/27/2022   Fall 03/26/2022   Physical deconditioning 03/26/2022   CAD (coronary artery disease) 03/25/2022   Hypertension 03/25/2022   Falls 03/25/2022   Thrombocytopenia (HCC) 03/25/2022   Atrial fibrillation (HCC) 03/25/2022   Chronic diastolic (congestive) heart failure (HCC) 03/25/2022   Right groin pain 05/17/2021   Near syncope 03/07/2019   Nocturia 06/26/2016   Lower abdominal pain 09/04/2015   Erectile dysfunction 08/30/2014   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   Incomplete emptying of bladder 01/26/2014   Increased frequency of urination 01/26/2014   Slowing of urinary stream 01/26/2014    Orientation RESPIRATION BLADDER Height & Weight     Self, Time, Place  Normal Continent Weight: 143 lb 4.8 oz (65 kg) Height:     BEHAVIORAL SYMPTOMS/MOOD NEUROLOGICAL BOWEL NUTRITION STATUS      Continent Diet (Heart Healthy)  AMBULATORY STATUS COMMUNICATION OF NEEDS Skin   Limited Assist Verbally Normal                       Personal Care Assistance Level  of Assistance  Bathing, Feeding, Dressing Bathing Assistance: Limited assistance Feeding assistance: Independent Dressing Assistance: Limited assistance     Functional Limitations Info  Sight, Hearing, Speech Sight Info: Adequate Hearing Info: Adequate Speech Info: Adequate    SPECIAL CARE FACTORS FREQUENCY  PT (By licensed PT), OT (By licensed OT)     PT Frequency: 5X Weekly OT Frequency: 5X Weekly            Contractures Contractures Info: Not present    Additional Factors Info  Code Status, Allergies Code Status Info: DNR Allergies Info: Pneumococcal Vaccines           Current Medications (01/20/2023):  This is the current hospital active medication list Current Facility-Administered Medications  Medication Dose Route Frequency Provider Last Rate Last Admin   acetaminophen (TYLENOL) tablet 650 mg  650 mg Oral Q6H PRN Rometta Emery, MD   650 mg at 01/20/23 1027   Or   acetaminophen (TYLENOL) suppository 650 mg  650 mg Rectal Q6H PRN Rometta Emery, MD       apixaban (ELIQUIS) tablet 5 mg  5 mg Oral BID Earlie Lou L, MD   5 mg at 01/20/23 1028   atorvastatin (LIPITOR) tablet 20 mg  20 mg Oral Daily Earlie Lou L, MD   20 mg at 01/19/23 0901   carbidopa-levodopa (SINEMET IR) 25-100 MG per tablet immediate release 2 tablet  2 tablet Oral QID Arnetha Courser, MD   2 tablet at 01/20/23 1538   carvedilol (COREG) tablet 6.25 mg  6.25 mg Oral BID WC Earlie Lou L, MD   6.25 mg at 01/20/23 1027   feeding supplement (ENSURE ENLIVE / ENSURE PLUS) liquid 237 mL  237 mL Oral TID BM Arnetha Courser, MD   237 mL at 01/20/23 1430   multivitamin with minerals tablet 1 tablet  1 tablet Oral Daily Rometta Emery, MD   1 tablet at 01/20/23 1027   ondansetron (ZOFRAN) tablet 4 mg  4 mg Oral Q6H PRN Rometta Emery, MD       Or   ondansetron (ZOFRAN) injection 4 mg  4 mg Intravenous Q6H PRN Rometta Emery, MD       pantoprazole (PROTONIX) EC tablet 40 mg  40 mg Oral  Daily Earlie Lou L, MD   40 mg at 01/20/23 1027   rOPINIRole (REQUIP) tablet 0.25 mg  0.25 mg Oral QHS Amin, Tilman Neat, MD   0.25 mg at 01/19/23 2200   tamsulosin (FLOMAX) capsule 0.4 mg  0.4 mg Oral Daily Arnetha Courser, MD   0.4 mg at 01/19/23 1711   zinc sulfate capsule 220 mg  220 mg Oral Daily Rometta Emery, MD   220 mg at 01/20/23 1027     Discharge Medications: Please see discharge summary for a list of discharge medications.  Relevant Imaging Results:  Relevant Lab Results:   Additional Information SSN: [id number], COVID POSITIVE AS OF 01/18/23  Susa Simmonds, LCSWA

## 2023-01-20 NOTE — Progress Notes (Signed)
  Progress Note   Patient: Scott Cortez ZOX:[id number] DOB: July 25, 1933 DOA: 01/18/2023     2 DOS: the patient was seen and examined on 01/20/2023   Brief hospital course: Taken from H&P.   Scott Cortez is a 89 y.o. male with medical history significant of coronary artery disease, AAA, BPH, bradycardia, diastolic dysfunction CHF, chronic pancreatitis, depression, GERD, essential hypertension, hyperlipidemia, Parkinson's disease, restless leg syndrome who was brought in from home with significant weakness.  Patient apparently lives alone with his history of dementia and Parkinson's. He is on chronic anticoagulation with Eliquis. APS apparently wanted the patient evaluated.   Vital stable on presentation, COVID-19 PCR came back positive.  Labs with hyponatremia , hypokalemia and mild thrombocytopenia. Chest x-ray negative for any acute abnormality.  9/7: Vital stable, on room air, labs with some leukopenia secondary to COVID infection, sodium 134, hypokalemia resolved, T. bili at 1.6, mildly elevated chronically. Pending PT and OT evaluation.  9/8: PT and OT recommending SNF.  Medically stable now   Assessment and Plan: * General weakness Likely multifactorial with his advanced Parkinson's and recent COVID infection.  Only had 1 day of diarrhea which has been improved.  No respiratory symptoms. -Supportive care -PT/OT evaluation-recommending SNF -TOC consult  COVID-19 virus infection Supportive care for now.  Parkinson disease -Continue home Sinemet  Hypertension Blood pressure currently within goal. -Home losartan and Lasix is being held due to initial softer blood pressure -Continue to monitor and resume home meds as needed  Chronic diastolic (congestive) heart failure (HCC) Clinically appears well compensated.  BNP at 170 -Monitor volume status  CAD (coronary artery disease) No chest pain. -Continue home statin and carvedilol  Atrial fibrillation  (HCC) -Continue home Eliquis and carvedilol  Hypokalemia Resolved with repletion. -Continue to monitor and replete as needed  BPH with obstruction/lower urinary tract symptoms -Continue home Flomax   Subjective: Patient was seen and examined today.  No new concern.  He is okay to go to rehab to get stronger.  Physical Exam: Vitals:   01/19/23 0805 01/19/23 1542 01/19/23 2009 01/20/23 0950  BP: 120/88 130/89 (!) 130/91 114/79  Pulse: (!) 107 79 83 64  Resp: 14 14 20 18   Temp: 97.9 F (36.6 C) 97.7 F (36.5 C) 97.9 F (36.6 C) 97.9 F (36.6 C)  TempSrc:      SpO2: 98% 99% 96% 100%   General.  Frail elderly man, in no acute distress. Pulmonary.  Lungs clear bilaterally, normal respiratory effort. CV.  Regular rate and rhythm, no JVD, rub or murmur. Abdomen.  Soft, nontender, nondistended, BS positive. CNS.  Alert and oriented .  No focal neurologic deficit. Extremities.  No edema, no cyanosis, pulses intact and symmetrical. Psychiatry.  Appears to have some cognitive impairment.  Data Reviewed: Prior data reviewed.  Family Communication: Talked with daughter on phone.  Disposition: Status is: Inpatient Remains inpatient appropriate because: Severity of illness  Planned Discharge Destination: SNF  DVT prophylaxis.  Eliquis Time spent: 44 minutes  This record has been created using Conservation officer, historic buildings. Errors have been sought and corrected,but may not always be located. Such creation errors do not reflect on the standard of care.   Author: Arnetha Courser, MD 01/20/2023 1:24 PM  For on call review [email or web address].

## 2023-01-21 DIAGNOSIS — U071 COVID-19: Secondary | ICD-10-CM | POA: Diagnosis not present

## 2023-01-21 DIAGNOSIS — R531 Weakness: Secondary | ICD-10-CM | POA: Diagnosis not present

## 2023-01-21 DIAGNOSIS — G20A1 Parkinson's disease without dyskinesia, without mention of fluctuations: Secondary | ICD-10-CM | POA: Diagnosis not present

## 2023-01-21 DIAGNOSIS — I10 Essential (primary) hypertension: Secondary | ICD-10-CM | POA: Diagnosis not present

## 2023-01-21 NOTE — Progress Notes (Signed)
Physical Therapy Treatment Patient Details Name: Scott Cortez MRN: [id number] DOB: [birth year] Today's Date: 01/21/2023   History of Present Illness Patient is a 89 year old male admitted from home with significant weakness, falls. History of dementia, Parkinson's. COVID +    PT Comments  Patient is agreeable to PT. He continues to require physical assistance with mobility. Patient has a posterior loss of balance with sitting and standing. Increased time and effort required with all mobility. Standing tolerance is limited for progression of ambulation. Anticipated patient could benefit from rehabilitation after this hospital stay <3 hours per day given that patient lives alone. PT will continue to follow to maximize independence and facilitate return to prior level of function.     If plan is discharge home, recommend the following: A little help with walking and/or transfers;A little help with bathing/dressing/bathroom;Assistance with cooking/housework;Assist for transportation;Help with stairs or ramp for entrance   Can travel by private vehicle     Yes  Equipment Recommendations  None recommended by PT    Recommendations for Other Services       Precautions / Restrictions Precautions Precautions: Fall Restrictions Weight Bearing Restrictions: No     Mobility  Bed Mobility               General bed mobility comments: patient seated on edge of bed on arrival to room with nurse and tech present    Transfers Overall transfer level: Needs assistance Equipment used: Rolling walker (2 wheels) Transfers: Bed to chair/wheelchair/BSC Sit to Stand: Mod assist, From elevated surface           General transfer comment: patient has posterior lean in sitting. faciliation for anterior weight shifting and cues for hand placement. lift off assistance required to stand.    Ambulation/Gait             Pre-gait activities: emphasis on anterior weight shifting,  increasing base of support, and weight shifting for improved static standing balance. standing activity tolerance limited by fatigue and generalized weakness     Stairs             Wheelchair Mobility     Tilt Bed    Modified Rankin (Stroke Patients Only)       Balance Overall balance assessment: Needs assistance, History of Falls Sitting-balance support: Feet supported Sitting balance-Leahy Scale: Fair     Standing balance support: Bilateral upper extremity supported, Reliant on assistive device for balance Standing balance-Leahy Scale: Poor Standing balance comment: moderat assistance, external support required to maintain standing balance with increased independence with increased standing time and increasing base of support                            Cognition Arousal: Alert Behavior During Therapy: WFL for tasks assessed/performed Overall Cognitive Status: No family/caregiver present to determine baseline cognitive functioning                                 General Comments: patient is cooperative but continues to prefer his own way to complete tasks with limited regard to therapist recommendation for safety and increased independence with mobility. he states it was a mistake to come to the hospital and he would have been fine at home with a mask in his own environment.        Exercises      General Comments  Pertinent Vitals/Pain Pain Assessment Pain Assessment: No/denies pain    Home Living                          Prior Function            PT Goals (current goals can now be found in the care plan section) Acute Rehab PT Goals Patient Stated Goal: to be able to walk PT Goal Formulation: With patient Time For Goal Achievement: 02/02/23 Potential to Achieve Goals: Good Progress towards PT goals: Progressing toward goals    Frequency    Min 1X/week      PT Plan      Co-evaluation               AM-PAC PT "6 Clicks" Mobility   Outcome Measure  Help needed turning from your back to your side while in a flat bed without using bedrails?: A Little Help needed moving from lying on your back to sitting on the side of a flat bed without using bedrails?: A Little Help needed moving to and from a bed to a chair (including a wheelchair)?: A Lot Help needed standing up from a chair using your arms (e.g., wheelchair or bedside chair)?: A Little Help needed to walk in hospital room?: A Little Help needed climbing 3-5 steps with a railing? : A Lot 6 Click Score: 16    End of Session   Activity Tolerance: Patient tolerated treatment well Patient left: in chair;with call bell/phone within reach;with chair alarm set (set-up with breakfast tray) Nurse Communication: Mobility status PT Visit Diagnosis: Muscle weakness (generalized) (M62.81);Unsteadiness on feet (R26.81)     Time: 1610-9604 PT Time Calculation (min) (ACUTE ONLY): 21 min  Charges:    $Therapeutic Activity: 8-22 mins PT General Charges $$ ACUTE PT VISIT: 1 Visit                    Donna Bernard, PT, MPT    [name] 01/21/2023, 10:36 AM

## 2023-01-21 NOTE — TOC Progression Note (Signed)
Transition of Care [facility]) - Progression Note    Patient Details  Name: Scott Cortez MRN: [id number] Date of Birth: [birth year]  [facility]) CM/SW Contact  Marlowe Sax, RN Phone Number: 01/21/2023, 3:55 PM  Clinical Narrative:    I reached out to multiple facilities to see what the Covid quarantine time is, none have any private rooms available at this time and he wuill need a 10 day quarantine, had long conversation with the daughter and the son in law I explained that he will continue to get PT while here and that he can not DC until 9/19 to go to [facility], I explained I will look when the time is closer to see who has a bed offer They are agreeable    Expected Discharge Plan: Skilled Nursing Facility Barriers to Discharge: Continued Medical Work up  Expected Discharge Plan and Services     Post Acute Care Choice: Skilled Nursing Facility Living arrangements for the past 2 months: Single Family Home                                       Social Determinants of Health (SDOH) Interventions SDOH Screenings   Food Insecurity: No Food Insecurity (01/18/2023)  Housing: Low Risk  (01/18/2023)  Transportation Needs: Unmet Transportation Needs (01/18/2023)  Utilities: Not At Risk (01/18/2023)  Tobacco Use: Medium Risk (08/20/2022)   Received from [facility], [facility]    Readmission Risk Interventions     No data to display

## 2023-01-21 NOTE — Progress Notes (Signed)
   01/21/23 1500  Spiritual Encounters  Type of Visit Initial  Care provided to: Pt and family  Referral source Nurse (RN/NT/LPN)  Reason for visit Advance directives  OnCall Visit No   Chaplain discussed paperwork with family. Patient asks process to be completed tomorrow. Paperwork left in the room for follow up with oncoming morning Chaplain.

## 2023-01-21 NOTE — Care Management Important Message (Signed)
Important Message  Patient Details  Name: Scott Cortez MRN: [id number] Date of Birth: 1933-10-19   Medicare Important Message Given:  N/A - LOS <3 / Initial given by admissions     [name] 01/21/2023, 10:46 AM

## 2023-01-21 NOTE — Plan of Care (Signed)
  Problem: Activity: Goal: Risk for activity intolerance will decrease Outcome: Progressing   Problem: Safety: Goal: Ability to remain free from injury will improve Outcome: Progressing   Problem: Skin Integrity: Goal: Risk for impaired skin integrity will decrease Outcome: Progressing   Problem: Health Behavior/Discharge Planning: Goal: Ability to manage health-related needs will improve Outcome: Progressing   Problem: Nutrition: Goal: Adequate nutrition will be maintained Outcome: Progressing   Problem: Coping: Goal: Level of anxiety will decrease Outcome: Progressing

## 2023-01-21 NOTE — Progress Notes (Signed)
  Progress Note   Patient: Scott Cortez UJW:[id number] DOB: [birth year] DOA: 01/18/2023     3 DOS: the patient was seen and examined on 01/21/2023   Brief hospital course: Taken from H&P.   Scott Cortez is a 89 y.o. male with medical history significant of coronary artery disease, AAA, BPH, bradycardia, diastolic dysfunction CHF, chronic pancreatitis, depression, GERD, essential hypertension, hyperlipidemia, Parkinson's disease, restless leg syndrome who was brought in from home with significant weakness.  Patient apparently lives alone with his history of dementia and Parkinson's. He is on chronic anticoagulation with Eliquis. APS apparently wanted the patient evaluated.   Vital stable on presentation, COVID-19 PCR came back positive.  Labs with hyponatremia , hypokalemia and mild thrombocytopenia. Chest x-ray negative for any acute abnormality.  9/7: Vital stable, on room air, labs with some leukopenia secondary to COVID infection, sodium 134, hypokalemia resolved, T. bili at 1.6, mildly elevated chronically. Pending PT and OT evaluation.  9/8: PT and OT recommending SNF.  Medically stable now  9/9: Remained hemodynamically stable.  Unfortunately will not be able to go to rehab until 01/29/2023 as he need to complete 10-day quarantine.   Assessment and Plan: * General weakness Likely multifactorial with his advanced Parkinson's and recent COVID infection.  Only had 1 day of diarrhea which has been improved.  No respiratory symptoms. -Supportive care -PT/OT evaluation-recommending SNF -TOC consult  COVID-19 virus infection Supportive care for now.  Parkinson disease -Continue home Sinemet  Hypertension Blood pressure currently within goal. -Home losartan and Lasix is being held due to initial softer blood pressure -Continue to monitor and resume home meds as needed  Chronic diastolic (congestive) heart failure (HCC) Clinically appears well compensated.  BNP at  170 -Monitor volume status  CAD (coronary artery disease) No chest pain. -Continue home statin and carvedilol  Atrial fibrillation (HCC) -Continue home Eliquis and carvedilol  Hypokalemia Resolved with repletion. -Continue to monitor and replete as needed  BPH with obstruction/lower urinary tract symptoms -Continue home Flomax   Subjective: Patient was sitting comfortably in chair when seen today.  Having some cough.  No other complaints.  Daughter at bedside  Physical Exam: Vitals:   01/20/23 0950 01/20/23 1348 01/20/23 2320 01/21/23 0759  BP: 114/79  112/76 99/68  Pulse: 64  65 68  Resp: 18  17 16   Temp: 97.9 F (36.6 C)  97.9 F (36.6 C) 98.2 F (36.8 C)  TempSrc:   Oral   SpO2: 100%  100% 94%  Weight:  65 kg     General.  Frail elderly man, in no acute distress. Pulmonary.  Lungs clear bilaterally, normal respiratory effort. CV.  Regular rate and rhythm, no JVD, rub or murmur. Abdomen.  Soft, nontender, nondistended, BS positive. CNS.  Alert and oriented .  No focal neurologic deficit. Extremities.  No edema, no cyanosis, pulses intact and symmetrical. Psychiatry.  Judgment and insight appears normal.   Data Reviewed: Prior data reviewed.  Family Communication: Discussed with daughter and son-in-law at bedside.  Disposition: Status is: Inpatient Remains inpatient appropriate because: Severity of illness  Planned Discharge Destination: SNF  DVT prophylaxis.  Eliquis Time spent: 42 minutes  This record has been created using Conservation officer, historic buildings. Errors have been sought and corrected,but may not always be located. Such creation errors do not reflect on the standard of care.   Author: Arnetha Courser, MD 01/21/2023 4:44 PM  For on call review [email or web address].

## 2023-01-21 NOTE — Plan of Care (Signed)

## 2023-01-22 DIAGNOSIS — I10 Essential (primary) hypertension: Secondary | ICD-10-CM | POA: Diagnosis not present

## 2023-01-22 DIAGNOSIS — U071 COVID-19: Secondary | ICD-10-CM | POA: Diagnosis not present

## 2023-01-22 DIAGNOSIS — G20A1 Parkinson's disease without dyskinesia, without mention of fluctuations: Secondary | ICD-10-CM | POA: Diagnosis not present

## 2023-01-22 DIAGNOSIS — R531 Weakness: Secondary | ICD-10-CM | POA: Diagnosis not present

## 2023-01-22 MED ORDER — BENZONATATE 100 MG PO CAPS
100.0000 mg | ORAL_CAPSULE | Freq: Three times a day (TID) | ORAL | Status: DC | PRN
  Administered 2023-01-22 – 2023-01-27 (×8): 100 mg via ORAL
  Filled 2023-01-22 (×8): qty 1

## 2023-01-22 NOTE — Progress Notes (Signed)
Progress Note   Patient: Scott Cortez HQI:[id number] DOB: 1934-04-08 DOA: 01/18/2023     4 DOS: the patient was seen and examined on 01/22/2023   Brief hospital course: Taken from H&P.   Kimmy Pipitone is a 89 y.o. male with medical history significant of coronary artery disease, AAA, BPH, bradycardia, diastolic dysfunction CHF, chronic pancreatitis, depression, GERD, essential hypertension, hyperlipidemia, Parkinson's disease, restless leg syndrome who was brought in from home with significant weakness.  Patient apparently lives alone with his history of dementia and Parkinson's. He is on chronic anticoagulation with Eliquis. APS apparently wanted the patient evaluated.   Vital stable on presentation, COVID-19 PCR came back positive.  Labs with hyponatremia , hypokalemia and mild thrombocytopenia. Chest x-ray negative for any acute abnormality.  9/7: Vital stable, on room air, labs with some leukopenia secondary to COVID infection, sodium 134, hypokalemia resolved, T. bili at 1.6, mildly elevated chronically. Pending PT and OT evaluation.  9/8: PT and OT recommending SNF.  Medically stable now  9/9: Remained hemodynamically stable.  Unfortunately will not be able to go to rehab until 01/29/2023 as he need to complete 10-day quarantine.  9/10: Remained stable-going to SNF on 9/17   Assessment and Plan: * General weakness Likely multifactorial with his advanced Parkinson's and recent COVID infection.  Only had 1 day of diarrhea which has been improved.  No respiratory symptoms. -Supportive care -PT/OT evaluation-recommending SNF -TOC consult  COVID-19 virus infection Supportive care for now.  Parkinson disease -Continue home Sinemet  Hypertension Blood pressure currently within goal. -Home losartan and Lasix is being held due to initial softer blood pressure -Continue to monitor and resume home meds as needed  Chronic diastolic (congestive) heart failure  (HCC) Clinically appears well compensated.  BNP at 170 -Monitor volume status  CAD (coronary artery disease) No chest pain. -Continue home statin and carvedilol  Atrial fibrillation (HCC) -Continue home Eliquis and carvedilol  Hypokalemia Resolved with repletion. -Continue to monitor and replete as needed  BPH with obstruction/lower urinary tract symptoms -Continue home Flomax   Subjective: Patient was seen and examined today.  Still having some cough.  He was not happy that he has to stay in the hospital that long.  Physical Exam: Vitals:   01/21/23 0759 01/21/23 1708 01/21/23 2345 01/22/23 0823  BP: 99/68 127/85 95/71 108/77  Pulse: 68 71 72 (!) 58  Resp: 16 18 18 18   Temp: 98.2 F (36.8 C) (!) 97.5 F (36.4 C) 97.8 F (36.6 C) (!) 97.5 F (36.4 C)  TempSrc:      SpO2: 94% 99% 98% 98%  Weight:       General.  Frail elderly man, in no acute distress. Pulmonary.  Lungs clear bilaterally, normal respiratory effort. CV.  Regular rate and rhythm, no JVD, rub or murmur. Abdomen.  Soft, nontender, nondistended, BS positive. CNS.  Alert and oriented .  No focal neurologic deficit. Extremities.  No edema, no cyanosis, pulses intact and symmetrical. Psychiatry.  Judgment and insight appears normal.   Data Reviewed: Prior data reviewed.  Family Communication: Discussed with daughter and son-in-law at bedside.  Disposition: Status is: Inpatient Remains inpatient appropriate because: Severity of illness  Planned Discharge Destination: SNF  DVT prophylaxis.  Eliquis Time spent: 40 minutes  This record has been created using Conservation officer, historic buildings. Errors have been sought and corrected,but may not always be located. Such creation errors do not reflect on the standard of care.   Author: Arnetha Courser, MD 01/22/2023  1:26 PM  For on call review [email or web address].

## 2023-01-22 NOTE — Progress Notes (Signed)
Mobility Specialist - Progress Note   01/22/23 1446  Mobility  Activity Transferred to/from [facility];Ambulated with assistance in room  Level of Assistance Moderate assist, patient does 50-74%  Assistive Device Front wheel walker  Distance Ambulated (ft) 4 ft  Activity Response Tolerated well  $Mobility charge 1 Mobility  Mobility Specialist Start Time (ACUTE ONLY) 1401  Mobility Specialist Stop Time (ACUTE ONLY) 1444  Mobility Specialist Time Calculation (min) (ACUTE ONLY) 43 min   Pt supine upon entry, utilizing RA. MS dons socks, Pt completed bed mob MinA for BLE, Mod-MaxA for trunk support-- post lean upon transfer to EOB, MaxA for support. Pt STS to RW MaxA upon standing with WBOS, vocal and tactile cueing required for hand placement. Pt amb to the [facility] requiring VC's for sequencing and manual movement of the RW, taking a series of short steps, unable to step through. Pt returned to bed, left supine with alarm set and needs within reach. RN notified.  Zetta Bills Mobility Specialist 01/22/23 2:52 PM

## 2023-01-22 NOTE — TOC Progression Note (Signed)
Transition of Care [facility]) - Progression Note    Patient Details  Name: Scott Cortez MRN: [id number] Date of Birth: 02-Dec-1933  Transition of Care [facility]) CM/SW Contact  Marlowe Sax, RN Phone Number: 01/22/2023, 12:52 PM  Clinical Narrative:    Met with the patient's daughter in the hall She stated that the patient will need his needs met quickly every time or he will not be willing to stay here until the 17th, I explained that there is a nurse going to his room, they are gathering supplies to do a linen bed change and to get him cleaned up, within a few min the nurse was entering the room    Expected Discharge Plan: Skilled Nursing Facility Barriers to Discharge: Continued Medical Work up  Expected Discharge Plan and Services     Post Acute Care Choice: Skilled Nursing Facility Living arrangements for the past 2 months: Single Family Home                                       Social Determinants of Health (SDOH) Interventions SDOH Screenings   Food Insecurity: No Food Insecurity (01/18/2023)  Housing: Low Risk  (01/18/2023)  Transportation Needs: Unmet Transportation Needs (01/18/2023)  Utilities: Not At Risk (01/18/2023)  Tobacco Use: Medium Risk (08/20/2022)   Received from [facility], [facility]    Readmission Risk Interventions     No data to display

## 2023-01-22 NOTE — Plan of Care (Signed)
  Problem: Nutrition: Goal: Adequate nutrition will be maintained Outcome: Progressing   Problem: Elimination: Goal: Will not experience complications related to bowel motility Outcome: Progressing Goal: Will not experience complications related to urinary retention Outcome: Progressing   

## 2023-01-22 NOTE — Plan of Care (Signed)

## 2023-01-22 NOTE — Progress Notes (Signed)
   01/22/23 1000  Spiritual Encounters  Type of Visit Initial  Care provided to: Pt and family  Referral source Patient request;Family  Reason for visit Advance directives  OnCall Visit Yes   Chaplain received page for ADR. Chaplain went and got witnesses for the patients ADR. Family already provided a notary. Paperwork was scanned and sent in.

## 2023-01-23 DIAGNOSIS — U071 COVID-19: Secondary | ICD-10-CM | POA: Diagnosis not present

## 2023-01-23 DIAGNOSIS — R531 Weakness: Secondary | ICD-10-CM | POA: Diagnosis not present

## 2023-01-23 DIAGNOSIS — I10 Essential (primary) hypertension: Secondary | ICD-10-CM | POA: Diagnosis not present

## 2023-01-23 DIAGNOSIS — G20A1 Parkinson's disease without dyskinesia, without mention of fluctuations: Secondary | ICD-10-CM | POA: Diagnosis not present

## 2023-01-23 NOTE — TOC Progression Note (Signed)
Transition of Care [facility]) - Progression Note    Patient Details  Name: Scott Cortez MRN: [id number] Date of Birth: April 26, 1934  Transition of Care [facility]) CM/SW Contact  Marlowe Sax, RN Phone Number: 01/23/2023, 12:09 PM  Clinical Narrative:    Met with the patient and he is agreeable to go to STR on Tuesday 9/17, he will continue to work with PT/OT here at the hospital   Expected Discharge Plan: Skilled Nursing Facility Barriers to Discharge: Continued Medical Work up  Expected Discharge Plan and Services     Post Acute Care Choice: Skilled Nursing Facility Living arrangements for the past 2 months: Single Family Home                                       Social Determinants of Health (SDOH) Interventions SDOH Screenings   Food Insecurity: No Food Insecurity (01/18/2023)  Housing: Low Risk  (01/18/2023)  Transportation Needs: Unmet Transportation Needs (01/18/2023)  Utilities: Not At Risk (01/18/2023)  Tobacco Use: Medium Risk (08/20/2022)   Received from [facility], [facility]    Readmission Risk Interventions     No data to display

## 2023-01-23 NOTE — Plan of Care (Signed)
  Problem: Activity: Goal: Risk for activity intolerance will decrease Outcome: Progressing   Problem: Nutrition: Goal: Adequate nutrition will be maintained Outcome: Progressing   Problem: Pain Managment: Goal: General experience of comfort will improve Outcome: Progressing   Problem: Safety: Goal: Ability to remain free from injury will improve Outcome: Progressing   Problem: Skin Integrity: Goal: Risk for impaired skin integrity will decrease Outcome: Progressing   

## 2023-01-23 NOTE — Progress Notes (Signed)
Progress Note   Patient: Scott Cortez ZOX:[id number] DOB: 03-15-1934 DOA: 01/18/2023     5 DOS: the patient was seen and examined on 01/23/2023   Brief hospital course: Taken from H&P.   Scott Cortez is a 89 y.o. male with medical history significant of coronary artery disease, AAA, BPH, bradycardia, diastolic dysfunction CHF, chronic pancreatitis, depression, GERD, essential hypertension, hyperlipidemia, Parkinson's disease, restless leg syndrome who was brought in from home with significant weakness.  Patient apparently lives alone with his history of dementia and Parkinson's. He is on chronic anticoagulation with Eliquis. APS apparently wanted the patient evaluated.   Vital stable on presentation, COVID-19 PCR came back positive.  Labs with hyponatremia , hypokalemia and mild thrombocytopenia. Chest x-ray negative for any acute abnormality.  9/7: Vital stable, on room air, labs with some leukopenia secondary to COVID infection, sodium 134, hypokalemia resolved, T. bili at 1.6, mildly elevated chronically. Pending PT and OT evaluation.  9/8: PT and OT recommending SNF.  Medically stable now  9/9: Remained hemodynamically stable.  Unfortunately will not be able to go to rehab until 01/29/2023 as he need to complete 10-day quarantine.  9/10: Remained stable-going to SNF on 9/17   Assessment and Plan: * General weakness Likely multifactorial with his advanced Parkinson's and recent COVID infection.  Only had 1 day of diarrhea which has been improved.  No respiratory symptoms. -Supportive care -PT/OT evaluation-recommending SNF -TOC consult  COVID-19 virus infection Supportive care for now.  Parkinson disease -Continue home Sinemet  Hypertension Blood pressure currently within goal. -Home losartan and Lasix is being held due to initial softer blood pressure -Continue to monitor and resume home meds as needed  Chronic diastolic (congestive) heart failure  (HCC) Clinically appears well compensated.  BNP at 170 -Monitor volume status  CAD (coronary artery disease) No chest pain. -Continue home statin and carvedilol  Atrial fibrillation (HCC) -Continue home Eliquis and carvedilol  Hypokalemia Resolved with repletion. -Continue to monitor and replete as needed  BPH with obstruction/lower urinary tract symptoms -Continue home Flomax   Subjective: Patient was working with PT when seen today.  No new concern.  Significant other in room and she want him to go locally so she can keep visiting him.  Physical Exam: Vitals:   01/22/23 0823 01/22/23 1738 01/22/23 2314 01/23/23 0902  BP: 108/77 99/73 96/73  112/72  Pulse: (!) 58 83 63 65  Resp: 18 18 18 18   Temp: (!) 97.5 F (36.4 C) (!) 97.5 F (36.4 C) 97.7 F (36.5 C)   TempSrc:   Oral   SpO2: 98% 97% 98% 97%  Weight:       General.  Frail elderly man, in no acute distress. Pulmonary.  Lungs clear bilaterally, normal respiratory effort. CV.  Regular rate and rhythm, no JVD, rub or murmur. Abdomen.  Soft, nontender, nondistended, BS positive. CNS.  Alert and oriented .  No focal neurologic deficit. Extremities.  No edema, no cyanosis, pulses intact and symmetrical. Psychiatry.  Judgment and insight appears normal.   Data Reviewed: Prior data reviewed.  Family Communication: Discussed with significant other at bedside.  Disposition: Status is: Inpatient Remains inpatient appropriate because: Severity of illness  Planned Discharge Destination: SNF  DVT prophylaxis.  Eliquis Time spent: 39 minutes  This record has been created using Conservation officer, historic buildings. Errors have been sought and corrected,but may not always be located. Such creation errors do not reflect on the standard of care.   Author: Arnetha Courser, MD 01/23/2023  1:22 PM  For on call review [email or web address].

## 2023-01-23 NOTE — Plan of Care (Signed)
  Problem: Coping: Goal: Level of anxiety will decrease Outcome: Progressing   Problem: Pain Managment: Goal: General experience of comfort will improve Outcome: Progressing   

## 2023-01-23 NOTE — Progress Notes (Signed)
Occupational Therapy Treatment Patient Details Name: Scott Cortez MRN: [id number] DOB: 1934/02/20 Today's Date: 01/23/2023   History of present illness Patient is a 89 year old male admitted from home with significant weakness, falls. History of dementia, Parkinson's. COVID +   OT comments  Upon entering the room, pt supine in bed and agreeable to OT intervention. Pt's friend in and out of room during session. Pt washing face with set up A. Pt needing redirection during session secondary to tangential speech. Pt also attempting to direct transfer and telling therapist where to stand, place hands, etc. Pt getting upset when therapist providing cues for safety. Pt needing mod A to stand from EOB and take steps to recliner chair with use of RW. Pt taking ~ 25 minutes to stand and transfer to recliner chair. Call bell and all needed items within reach with chair alarm activated and significant other present in room.       If plan is discharge home, recommend the following:  Assistance with cooking/housework;Direct supervision/assist for financial management;Supervision due to cognitive status;Assist for transportation;Direct supervision/assist for medications management;Help with stairs or ramp for entrance;A lot of help with walking and/or transfers;A lot of help with bathing/dressing/bathroom   Equipment Recommendations  Other (comment) (defer to next venue of care)       Precautions / Restrictions Precautions Precautions: Fall       Mobility Bed Mobility Overal bed mobility: Needs Assistance Bed Mobility: Supine to Sit     Supine to sit: Mod assist          Transfers Overall transfer level: Needs assistance Equipment used: Rolling walker (2 wheels) Transfers: Bed to chair/wheelchair/BSC, Sit to/from Stand Sit to Stand: Mod assist, From elevated surface     Step pivot transfers: Min assist, Mod assist           Balance Overall balance assessment: Needs  assistance, History of Falls Sitting-balance support: Feet supported Sitting balance-Leahy Scale: Fair     Standing balance support: Bilateral upper extremity supported, Reliant on assistive device for balance Standing balance-Leahy Scale: Poor                             ADL either performed or assessed with clinical judgement   ADL Overall ADL's : Needs assistance/impaired     Grooming: Set up;Supervision/safety;Sitting                   Toilet Transfer: Moderate assistance;Rolling walker (2 wheels) Toilet Transfer Details (indicate cue type and reason): simulated                Extremity/Trunk Assessment Upper Extremity Assessment Upper Extremity Assessment: Generalized weakness   Lower Extremity Assessment Lower Extremity Assessment: Generalized weakness        Vision Patient Visual Report: No change from baseline            Cognition Arousal: Alert Behavior During Therapy: WFL for tasks assessed/performed Overall Cognitive Status: History of cognitive impairments - at baseline                                                     Pertinent Vitals/ Pain       Pain Assessment Pain Assessment: No/denies pain         Frequency  Min  1X/week        Progress Toward Goals  OT Goals(current goals can now be found in the care plan section)  Progress towards OT goals: Progressing toward goals      AM-PAC OT "6 Clicks" Daily Activity     Outcome Measure   Help from another person eating meals?: None Help from another person taking care of personal grooming?: A Little Help from another person toileting, which includes using toliet, bedpan, or urinal?: A Lot Help from another person bathing (including washing, rinsing, drying)?: A Lot Help from another person to put on and taking off regular upper body clothing?: A Little Help from another person to put on and taking off regular lower body clothing?: A Lot 6  Click Score: 16    End of Session Equipment Utilized During Treatment: Rolling walker (2 wheels)  OT Visit Diagnosis: Unsteadiness on feet (R26.81);Other abnormalities of gait and mobility (R26.89);Muscle weakness (generalized) (M62.81);History of falling (Z91.81);Other symptoms and signs involving cognitive function   Activity Tolerance Patient tolerated treatment well   Patient Left with call bell/phone within reach;in chair;with chair alarm set   Nurse Communication Mobility status        Time: 1610-9604 OT Time Calculation (min): 36 min  Charges: OT General Charges $OT Visit: 1 Visit OT Treatments $Therapeutic Activity: 23-37 mins  Jackquline Denmark, MS, OTR/L , CBIS ascom [phone]  01/23/23, 1:53 PM

## 2023-01-24 DIAGNOSIS — G20A1 Parkinson's disease without dyskinesia, without mention of fluctuations: Secondary | ICD-10-CM | POA: Diagnosis not present

## 2023-01-24 DIAGNOSIS — I10 Essential (primary) hypertension: Secondary | ICD-10-CM | POA: Diagnosis not present

## 2023-01-24 DIAGNOSIS — R531 Weakness: Secondary | ICD-10-CM | POA: Diagnosis not present

## 2023-01-24 DIAGNOSIS — U071 COVID-19: Secondary | ICD-10-CM | POA: Diagnosis not present

## 2023-01-24 NOTE — Progress Notes (Signed)
Physical Therapy Treatment Patient Details Name: Scott Cortez MRN: [id number] DOB: 12/27/1933 Today's Date: 01/24/2023   History of Present Illness Patient is a 89 year old male admitted from home with significant weakness, falls. History of dementia, Parkinson's. COVID +    PT Comments  Pt received up in chair c/o fatigue and pain in coccyx. Several attempts to assist with sit to stand w/wo RW, pt unable to raise buttocks off seat. Pt completed chair to bed lateral scoot with increased time and rest breaks with MinA. Once EOB, pt required ModA to raise LE's up onto bed. Bridging x 5 to attain center alignment in bed. Significant increased assist needed for transfers this date. Will continue to see acutely per POC, recommendations remain appropriate.    If plan is discharge home, recommend the following: A lot of help with walking and/or transfers;A lot of help with bathing/dressing/bathroom;Assistance with cooking/housework;Assistance with feeding;Assist for transportation;Help with stairs or ramp for entrance   Can travel by private vehicle     No  Equipment Recommendations  None recommended by PT    Recommendations for Other Services       Precautions / Restrictions Precautions Precautions: Fall Restrictions Weight Bearing Restrictions: No     Mobility  Bed Mobility Overal bed mobility: Needs Assistance Bed Mobility: Sit to Supine       Sit to supine: Mod assist   General bed mobility comments: ModA for B LE's    Transfers Overall transfer level: Needs assistance Equipment used: None Transfers: Sit to/from Stand, Bed to chair/wheelchair/BSC Sit to Stand: Total assist          Lateral/Scoot Transfers: Mod assist General transfer comment: patient has posterior lean in sitting. faciliation for anterior weight shifting and cues for hand placement. Multiple attempts to stand w/wo RW, pt unable    Ambulation/Gait               General Gait Details:  Unable   Stairs             Wheelchair Mobility     Tilt Bed    Modified Rankin (Stroke Patients Only)       Balance Overall balance assessment: Needs assistance, History of Falls Sitting-balance support: Feet supported, Bilateral upper extremity supported Sitting balance-Leahy Scale: Fair Sitting balance - Comments: increased fatigue after sitting up in chair for several hours Postural control: Posterior lean   Standing balance-Leahy Scale: Zero Standing balance comment: Unable to stand from chair with MaxA                            Cognition Arousal: Alert Behavior During Therapy: [facility] for tasks assessed/performed Overall Cognitive Status: History of cognitive impairments - at baseline                                 General Comments: patient is cooperative but continues to prefer his own way to complete tasks with limited regard to therapist recommendation for safety and increased independence with mobility. he states it was a mistake to come to the hospital and he would have been fine at home with a mask in his own environment.        Exercises General Exercises - Lower Extremity Ankle Circles/Pumps: AROM, Both, 10 reps, Supine Heel Slides: AAROM, Both, 5 reps, Supine Other Exercises Other Exercises: safety cues provided with exercise. patient demonstrating exercises he  has performed with past home health PT Other Exercises: bridging x 5    General Comments General comments (skin integrity, edema, etc.): Vc's for safe transfers and technique. Pt educated on role of PT and importance of OOB activity      Pertinent Vitals/Pain Pain Assessment Pain Assessment: 0-10 Pain Score: 2  Pain Location: coccyx Pain Descriptors / Indicators: Burning, Aching Pain Intervention(s): Repositioned    Home Living                          Prior Function            PT Goals (current goals can now be found in the care plan section)  Acute Rehab PT Goals Patient Stated Goal: to be able to walk    Frequency    Min 1X/week      PT Plan      Co-evaluation              AM-PAC PT "6 Clicks" Mobility   Outcome Measure  Help needed turning from your back to your side while in a flat bed without using bedrails?: A Lot Help needed moving from lying on your back to sitting on the side of a flat bed without using bedrails?: A Lot Help needed moving to and from a bed to a chair (including a wheelchair)?: A Lot Help needed standing up from a chair using your arms (e.g., wheelchair or bedside chair)?: A Lot Help needed to walk in hospital room?: A Lot Help needed climbing 3-5 steps with a railing? : A Lot 6 Click Score: 12    End of Session   Activity Tolerance: Patient limited by fatigue Patient left: in bed;with call bell/phone within reach;with bed alarm set Nurse Communication: Mobility status PT Visit Diagnosis: Muscle weakness (generalized) (M62.81);Unsteadiness on feet (R26.81)     Time: 2956-2130 PT Time Calculation (min) (ACUTE ONLY): 31 min  Charges:    $Therapeutic Exercise: 8-22 mins $Therapeutic Activity: 8-22 mins PT General Charges $$ ACUTE PT VISIT: 1 Visit                    Scott Cortez, PTA  Scott Cortez 01/24/2023, 1:41 PM

## 2023-01-24 NOTE — TOC Progression Note (Signed)
Transition of Care [facility]) - Progression Note    Patient Details  Name: Scott Cortez MRN: [id number] Date of Birth: 1934-01-28  Transition of Care [facility] CM/SW Contact  Marlowe Sax, RN Phone Number: 01/24/2023, 10:29 AM  Clinical Narrative:    Met with the patient, he is very pleasant and agreeable to stay thru quarantine for Covid will do a bedsearch on 09/15 for DC on 09/17   Expected Discharge Plan: Skilled Nursing Facility Barriers to Discharge: Continued Medical Work up  Expected Discharge Plan and Services     Post Acute Care Choice: Skilled Nursing Facility Living arrangements for the past 2 months: Single Family Home                                       Social Determinants of Health (SDOH) Interventions SDOH Screenings   Food Insecurity: No Food Insecurity (01/18/2023)  Housing: Low Risk  (01/18/2023)  Transportation Needs: Unmet Transportation Needs (01/18/2023)  Utilities: Not At Risk (01/18/2023)  Tobacco Use: Medium Risk (08/20/2022)   Received from [facility], [facility]    Readmission Risk Interventions     No data to display

## 2023-01-24 NOTE — Plan of Care (Signed)
  Problem: Clinical Measurements: Goal: Will remain free from infection Outcome: Progressing   Problem: Nutrition: Goal: Adequate nutrition will be maintained Outcome: Progressing   Problem: Pain Managment: Goal: General experience of comfort will improve Outcome: Progressing   Problem: Skin Integrity: Goal: Risk for impaired skin integrity will decrease Outcome: Progressing   

## 2023-01-24 NOTE — Plan of Care (Signed)
  Problem: Pain Managment: Goal: General experience of comfort will improve Outcome: Progressing   Problem: Skin Integrity: Goal: Risk for impaired skin integrity will decrease Outcome: Progressing   

## 2023-01-24 NOTE — Progress Notes (Signed)
  Progress Note   Patient: Scott Cortez ZSW:[id number] DOB: Dec 06, 1933 DOA: 01/18/2023     6 DOS: the patient was seen and examined on 01/24/2023   Brief hospital course: Taken from H&P.   Scott Cortez is a 89 y.o. male with medical history significant of coronary artery disease, AAA, BPH, bradycardia, diastolic dysfunction CHF, chronic pancreatitis, depression, GERD, essential hypertension, hyperlipidemia, Parkinson's disease, restless leg syndrome who was brought in from home with significant weakness.  Patient apparently lives alone with his history of dementia and Parkinson's. He is on chronic anticoagulation with Eliquis. APS apparently wanted the patient evaluated.   Vital stable on presentation, COVID-19 PCR came back positive.  Labs with hyponatremia , hypokalemia and mild thrombocytopenia. Chest x-ray negative for any acute abnormality.  9/7: Vital stable, on room air, labs with some leukopenia secondary to COVID infection, sodium 134, hypokalemia resolved, T. bili at 1.6, mildly elevated chronically. Pending PT and OT evaluation.  9/8: PT and OT recommending SNF.  Medically stable now  9/9: Remained hemodynamically stable.  Unfortunately will not be able to go to rehab until 01/29/2023 as he need to complete 10-day quarantine.  9/10: Remained stable-going to SNF on 9/17   Assessment and Plan: * General weakness Likely multifactorial with his advanced Parkinson's and recent COVID infection.  Only had 1 day of diarrhea which has been improved.  No respiratory symptoms. -Supportive care -PT/OT evaluation-recommending SNF -TOC consult  COVID-19 virus infection Supportive care for now.  Parkinson disease -Continue home Sinemet  Hypertension Blood pressure currently within goal. -Home losartan and Lasix is being held due to initial softer blood pressure -Continue to monitor and resume home meds as needed  Chronic diastolic (congestive) heart failure  (HCC) Clinically appears well compensated.  BNP at 170 -Monitor volume status  CAD (coronary artery disease) No chest pain. -Continue home statin and carvedilol  Atrial fibrillation (HCC) -Continue home Eliquis and carvedilol  Hypokalemia Resolved with repletion. -Continue to monitor and replete as needed  BPH with obstruction/lower urinary tract symptoms -Continue home Flomax   Subjective: Patient was sitting comfortably in chair when seen today.  No new concern.  Physical Exam: Vitals:   01/23/23 1449 01/23/23 1733 01/24/23 0018 01/24/23 0731  BP: 99/63  (!) 101/58 101/69  Pulse: 74 67 72 74  Resp: 18  18 16   Temp: 97.8 F (36.6 C)  97.8 F (36.6 C) 98.2 F (36.8 C)  TempSrc:      SpO2: 99%  97% 98%  Weight:       General.  Frail elderly man, in no acute distress. Pulmonary.  Lungs clear bilaterally, normal respiratory effort. CV.  Regular rate and rhythm, no JVD, rub or murmur. Abdomen.  Soft, nontender, nondistended, BS positive. CNS.  Alert and oriented .  No focal neurologic deficit. Extremities.  No edema, no cyanosis, pulses intact and symmetrical. Psychiatry.  Judgment and insight appears normal.   Data Reviewed: Prior data reviewed.  Family Communication: No family at bedside today  Disposition: Status is: Inpatient Remains inpatient appropriate because: Severity of illness  Planned Discharge Destination: SNF  DVT prophylaxis.  Eliquis Time spent: 38 minutes  This record has been created using Conservation officer, historic buildings. Errors have been sought and corrected,but may not always be located. Such creation errors do not reflect on the standard of care.   Author: Arnetha Courser, MD 01/24/2023 3:28 PM  For on call review [email or web address].

## 2023-01-25 DIAGNOSIS — R531 Weakness: Secondary | ICD-10-CM | POA: Diagnosis not present

## 2023-01-25 LAB — URINALYSIS, COMPLETE (UACMP) WITH MICROSCOPIC
Bilirubin Urine: NEGATIVE
Glucose, UA: NEGATIVE mg/dL
Ketones, ur: 5 mg/dL — AB
Nitrite: NEGATIVE
Protein, ur: NEGATIVE mg/dL
Specific Gravity, Urine: 1.027 (ref 1.005–1.030)
WBC, UA: >50 WBC/hpf (ref 0–5)
pH: 6 (ref 5.0–8.0)

## 2023-01-25 LAB — BASIC METABOLIC PANEL
Anion gap: 7 (ref 5–15)
BUN: 23 mg/dL (ref 8–23)
CO2: 29 mmol/L (ref 22–32)
Calcium: 8.7 mg/dL — ABNORMAL LOW (ref 8.9–10.3)
Chloride: 97 mmol/L — ABNORMAL LOW (ref 98–111)
Creatinine, Ser: 0.61 mg/dL (ref 0.61–1.24)
GFR, Estimated: >60 mL/min (ref >60)
Glucose, Bld: 99 mg/dL (ref 70–99)
Potassium: 4.3 mmol/L (ref 3.5–5.1)
Sodium: 133 mmol/L — ABNORMAL LOW (ref 135–145)

## 2023-01-25 LAB — CBC
HCT: 41.5 % (ref 39.0–52.0)
Hemoglobin: 14.3 g/dL (ref 13.0–17.0)
MCH: 31.8 pg (ref 26.0–34.0)
MCHC: 34.5 g/dL (ref 30.0–36.0)
MCV: 92.4 fL (ref 80.0–100.0)
Platelets: 155 10*3/uL (ref 150–400)
RBC: 4.49 MIL/uL (ref 4.22–5.81)
RDW: 12.6 % (ref 11.5–15.5)
WBC: 4.3 10*3/uL (ref 4.0–10.5)
nRBC: 0 % (ref 0.0–0.2)

## 2023-01-25 NOTE — Plan of Care (Signed)

## 2023-01-25 NOTE — Discharge Instructions (Signed)
These agencies are NOT affiliated with [facility] in any way, These are all Private Pay, not covered by [employer].  The cost will vary depending on level of care needed.  This list is used as a Sports administrator and not a recommendation.  PCS (Personal Care Service) service and placement assistance agency [facility],  Candie Chroman  [phone] Does not require a Contract or Min number of hours  PCS (Personal Care Service) service and placement assistance agency [facility] , Valeda Malm,  [phone] Does have a Minimum number of hours required  [facility] (Personal Care Service) [facility], Mellody Drown [phone] Does have a Minimum number of hours required   Transportation Resources  Agency Name: [facility] Agency Address: [address] [place], [place] [address] Phone: [phone] Email: [email or web address] Website: [email or web address] Service(s) Offered: Housing services, self-sufficiency, congregate meal program, weatherization program, Field seismologist program, emergency food assistance,  housing counseling, home ownership program, wheels-towork program.  Agency Name: [facility] [employer] [phone]) Address: [address], [place], [place] [address] Phone: [phone] Website: [email or web address] Service(s) Offered: Transportation for [employer], subscription and demand response; Dial-a-Ride for citizens 9 years of age or older.  Agency Name: Department of Social Services Address: [address] [place], [place] [address] Phone: [phone] Service(s) Offered: Child support services; child welfare services; food stamps; Medicaid; work first family assistance; and aid with fuel,  rent, food and medicine, transportation assistance.  Agency Name: Disabled [employer] (DAV) Transportation  Network Phone: [phone] Service(s) Offered: Transports  veterans to the [facility] medical center. Call  forty-eight hours in advance and leave the name, telephone  number, date, and time of appointment. Veteran will be  contacted by the driver the day before the appointment to  arrange a pick up point   Transportation Resources  Agency Name: [facility] Agency Address: [address] [place], [place] [address] Phone: [phone] Email: [email or web address] Website: [email or web address] Service(s) Offered: Housing services, self-sufficiency, congregate meal program, weatherization program, Field seismologist program, emergency food assistance,  housing counseling, home ownership program, wheels-towork program.  Agency Name: [facility] [employer] [phone]) Address: [address] [place], [place] [address] Phone: [phone] Website: [email or web address] Service(s) Offered: Transportation for [employer], subscription and demand response; Dial-a-Ride for citizens 83 years of age or older.  Agency Name: Department of Social Services Address: [address] [place], [place] [address] Phone: [phone] Service(s) Offered: Child support services; child welfare services; food stamps; Medicaid; work first family assistance; and aid with fuel,  rent, food and medicine, transportation assistance.  Agency Name: Disabled [employer] (DAV) Transportation  Network Phone: [phone] Service(s) Offered: Transports veterans to the [facility] medical center. Call  forty-eight hours in advance and leave the name, telephone  number, date, and time of appointment. Veteran will be  contacted by the driver the day before the appointment to  arrange a pick up point    [employer] ACTA currently provides door to door services. ACTA connects with PART daily for services to [facility]. ACTA also performs contract services to [employer]  operates 27 vehicles, all but 3 mini-vans are equipped with lifts for special needs as well as the general public. ACTA drivers are each CDL certified and trained in First Aid and CPR. ACT[employer] was established in 2002 by [employer]. An independent Industrial/product designer. ACTA operates via Cytogeneticist with required local 10%  match funding from [place]. ACTA provides over 80,000 passenger trips each year, including [facility] and [employer] sites.  Call at least by 11 AM one business day prior to needing transportation  [employer].                      [place], [place] [address]     Office Hours: Monday-Friday  8 AM - 5 PM

## 2023-01-25 NOTE — TOC Progression Note (Signed)
Transition of Care [facility]) - Progression Note    Patient Details  Name: Scott Cortez MRN: [id number] Date of Birth: Aug 01, 1933  Transition of Care [facility]) CM/SW Contact  Marlowe Sax, RN Phone Number: 01/25/2023, 10:43 AM  Clinical Narrative:     Met with the patient to discuss Rehab bed offers, he stated he needed [place] to the bathroom first I went and asked the staff to assist, I called his daughter Cala Bradford and left a general VM asking for a call back  Expected Discharge Plan: Skilled Nursing Facility Barriers to Discharge: Continued Medical Work up  Expected Discharge Plan and Services     Post Acute Care Choice: Skilled Nursing Facility Living arrangements for the past 2 months: Single Family Home                                       Social Determinants of Health (SDOH) Interventions SDOH Screenings   Food Insecurity: No Food Insecurity (01/18/2023)  Housing: Low Risk  (01/18/2023)  Transportation Needs: Unmet Transportation Needs (01/18/2023)  Utilities: Not At Risk (01/18/2023)  Tobacco Use: Medium Risk (08/20/2022)   Received from [facility], [facility]    Readmission Risk Interventions     No data to display

## 2023-01-25 NOTE — TOC Progression Note (Addendum)
Transition of Care [facility]) - Progression Note    Patient Details  Name: Scott Cortez MRN: [id number] Date of Birth: 1934/02/14  Transition of Care [facility]) CM/SW Contact  Marlowe Sax, RN Phone Number: 01/25/2023, 1:21 PM  Clinical Narrative:    Called and Spoke with Cala Bradford the daughter and reviewed the bed offers, we reviewed bed offers and star ratings from medicare.gov She is going to speak to her dad again She stated that the patient is complaining of burning when urinating, she is asking for urinalysis to make sure he does not have a UTI I explained that transportation to the facility will be arranged to go to rehab but home from rehab will be up to the family to arrange I explained that PCS services will be an out of pocket expense if needed Home health would be set up if needed when discharging from facility, I explained that is PT, ot and aide  She stated that he does have help a few days a week doing light duty housework I added transportation resources and PCS resources to the AVS    Expected Discharge Plan: Skilled Nursing Facility Barriers to Discharge: Continued Medical Work up  Expected Discharge Plan and Services     Post Acute Care Choice: Skilled Nursing Facility Living arrangements for the past 2 months: Single Family Home                                       Social Determinants of Health (SDOH) Interventions SDOH Screenings   Food Insecurity: No Food Insecurity (01/18/2023)  Housing: Low Risk  (01/18/2023)  Transportation Needs: Unmet Transportation Needs (01/18/2023)  Utilities: Not At Risk (01/18/2023)  Tobacco Use: Medium Risk (08/20/2022)   Received from [facility], [facility]    Readmission Risk Interventions     No data to display

## 2023-01-25 NOTE — Plan of Care (Signed)

## 2023-01-25 NOTE — Progress Notes (Signed)
PROGRESS NOTE    Scott Cortez  NFA:[id number] DOB: 03-11-1934 DOA: 01/18/2023 [facility]: [facility]  146A/146A-AA  LOS: 7 days   Brief hospital course:   Assessment & Plan:  Scott Cortez is a 89 y.o. male with medical history significant of coronary artery disease, AAA, BPH, bradycardia, diastolic dysfunction CHF, chronic pancreatitis, depression, GERD, essential hypertension, hyperlipidemia, Parkinson's disease, restless leg syndrome who was brought in from home with significant weakness.  Patient apparently lives alone with his history of dementia and Parkinson's. He is on chronic anticoagulation with Eliquis. APS apparently wanted the patient evaluated.    Vital stable on presentation, COVID-19 PCR came back positive.  Labs with hyponatremia , hypokalemia and mild thrombocytopenia. Chest x-ray negative for any acute abnormality.   9/7: Vital stable, on room air, labs with some leukopenia secondary to COVID infection, sodium 134, hypokalemia resolved, T. bili at 1.6, mildly elevated chronically. Pending PT and OT evaluation.   9/8: PT and OT recommending SNF.  Medically stable now   9/9: Remained hemodynamically stable.  Unfortunately will not be able to go to rehab until 01/29/2023 as he need to complete 10-day quarantine.   9/10: Remained stable-going to SNF on 9/17   * General weakness Likely multifactorial with his advanced Parkinson's and recent COVID infection.  Only had 1 day of diarrhea which has been improved.  No respiratory symptoms. -PT/OT evaluation-recommending SNF   COVID-19 virus infection Supportive care for now.   Parkinson disease -Continue home Sinemet   Hypertension Blood pressure currently within goal. -Home losartan and Lasix is being held due to initial softer blood pressure --cont coreg   Chronic diastolic (congestive) heart failure (HCC) Clinically appears well compensated.  BNP at 170 --cont coreg   CAD  (coronary artery disease) No chest pain. -Continue home statin and carvedilol   Atrial fibrillation (HCC) -Continue home Eliquis and carvedilol   Hypokalemia -Continue to monitor and supplement as needed   BPH with obstruction/lower urinary tract symptoms -Continue home Flomax     DVT prophylaxis: IO:NGEXBMW Code Status: DNR  Family Communication: significant other updated at bedside today Level of care: Telemetry Medical Dispo:   The patient is from: home Anticipated d/c is to: SNF rehab Anticipated d/c date is: whenever bed available   Subjective and Interval History:  Pt reported burning in his legs and hands.  Reported to his daughter burning with urination that he said happened one time this morning.   Objective: Vitals:   01/24/23 2347 01/25/23 0111 01/25/23 0742 01/25/23 1537  BP: (!) 87/57 103/69 98/81 90/64   Pulse: 61 70 67 84  Resp: 18  16 18   Temp: 97.7 F (36.5 C)  98 F (36.7 C) 97.6 F (36.4 C)  TempSrc: Oral  Oral Oral  SpO2: 98%  97% 98%  Weight:        Intake/Output Summary (Last 24 hours) at 01/25/2023 1912 Last data filed at 01/25/2023 1000 Gross per 24 hour  Intake --  Output 700 ml  Net -700 ml   Filed Weights   01/20/23 1348  Weight: 65 kg    Examination:   Constitutional: NAD, AAOx3 HEENT: conjunctivae and lids normal, EOMI CV: No cyanosis.   RESP: normal respiratory effort, on RA Neuro: II - XII grossly intact.   Psych: Normal mood and affect.  Appropriate judgement and reason   Data Reviewed: I have personally reviewed labs and imaging studies  Time spent: 35 minutes  Darlin Priestly, MD [facility] If  7PM-7AM, please contact night-coverage 01/25/2023, 7:12 PM

## 2023-01-25 NOTE — Progress Notes (Signed)
Mobility Specialist - Progress Note   01/25/23 1013  Mobility  Activity Stood at bedside;Ambulated with assistance in room  Level of Assistance Minimal assist, patient does 75% or more  Assistive Device Front wheel walker  Distance Ambulated (ft) 6 ft  Range of Motion/Exercises Active;Right leg;Left leg  Activity Response Tolerated well  $Mobility charge 1 Mobility  Mobility Specialist Start Time (ACUTE ONLY) 0930  Mobility Specialist Stop Time (ACUTE ONLY) 1008  Mobility Specialist Time Calculation (min) (ACUTE ONLY) 38 min   Pt sitting in the recliner upon entry, utilizing RA. Pt agreeable to exercise and amb within the room this date. While seated Pt actively completed 31 Bilateral single leg marches, and 50 leg raises. Pt demonstrates poor safety awareness when STS to RW MinA +2-- placing both hands on the RW when standing, unsteady with WBOS and post lean. Pt amb 6 ft forwards MinA +2, required max multimodal cueing for weight shifting to advance BLE, RW navigation and hand placement when standing/sitting. Pt returned seated in the recliner, left with alarm set and needs within reach.  Zetta Bills Mobility Specialist 01/25/23 10:30 AM

## 2023-01-25 NOTE — TOC Progression Note (Signed)
Transition of Care [facility]) - Progression Note    Patient Details  Name: Scott Cortez MRN: [id number] Date of Birth: December 03, 1933  Transition [facility]) CM/SW Contact  Marlowe Sax, RN Phone Number: 01/25/2023, 3:38 PM  Clinical Narrative:     Spoke with the patient and his friend and daughter about choices for Rehab, he chose [facility] I notified Tiffany at [employer]   Expected Discharge Plan: Skilled Nursing Facility Barriers to Discharge: Continued Medical Work up  [facility] and Services     Post Acute Care Choice: Skilled Nursing Facility Living arrangements for the past 2 months: Single Family Home                                       Social Determinants of Health (SDOH) Interventions SDOH Screenings   Food Insecurity: No Food Insecurity (01/18/2023)  Housing: Low Risk  (01/18/2023)  Transportation Needs: Unmet Transportation Needs (01/18/2023)  Utilities: Not At Risk (01/18/2023)  Tobacco Use: Medium Risk (08/20/2022)   Received from [facility], [facility]    Readmission Risk Interventions     No data to display

## 2023-01-26 DIAGNOSIS — R531 Weakness: Secondary | ICD-10-CM | POA: Diagnosis not present

## 2023-01-26 NOTE — Progress Notes (Signed)
PROGRESS NOTE    Scott Cortez  BMW:[id number] DOB: 11/02/1933 DOA: 01/18/2023 [facility]  146A/146A-AA  LOS: 8 days   Brief hospital course:   Assessment & Plan:  Scott Cortez is a 89 y.o. male with medical history significant of coronary artery disease, AAA, BPH, bradycardia, diastolic dysfunction CHF, chronic pancreatitis, depression, GERD, essential hypertension, hyperlipidemia, Parkinson's disease, restless leg syndrome who was brought in from home with significant weakness.  Patient apparently lives alone with his history of dementia and Parkinson's. He is on chronic anticoagulation with Eliquis. APS apparently wanted the patient evaluated.    Vital stable on presentation, COVID-19 PCR came back positive.  Labs with hyponatremia , hypokalemia and mild thrombocytopenia. Chest x-ray negative for any acute abnormality.   9/7: Vital stable, on room air, labs with some leukopenia secondary to COVID infection, sodium 134, hypokalemia resolved, T. bili at 1.6, mildly elevated chronically. Pending PT and OT evaluation.   9/8: PT and OT recommending SNF.  Medically stable now   9/9: Remained hemodynamically stable.  Unfortunately will not be able to go to rehab until 01/29/2023 as he need to complete 10-day quarantine.   9/10: Remained stable-going to SNF on 9/17   * General weakness Likely multifactorial with his advanced Parkinson's and recent COVID infection.  Only had 1 day of diarrhea which has been improved.  No respiratory symptoms. -PT/OT evaluation-recommending SNF   COVID-19 virus infection Supportive care for now.   Parkinson disease -Continue home Sinemet   Hypertension Blood pressure currently within goal. -Home losartan and Lasix is being held due to initial softer blood pressure --cont coreg   Chronic diastolic (congestive) heart failure (HCC) Clinically appears well compensated.  BNP at 170 --cont coreg   CAD  (coronary artery disease) No chest pain. -Continue home statin and carvedilol   Atrial fibrillation (HCC) -Continue home Eliquis and carvedilol   Hypokalemia -Continue to monitor and supplement as needed   BPH with obstruction/lower urinary tract symptoms -Continue home Flomax     DVT prophylaxis: UV:[id number] Code Status: DNR  Family Communication:  Level of care: Telemetry Medical Dispo:   The patient is from: home Anticipated d/c is to: SNF rehab Anticipated d/c date is: whenever bed available   Subjective and Interval History:  No new event today.   Objective: Vitals:   01/26/23 0005 01/26/23 0806 01/26/23 1434 01/26/23 1643  BP: 102/66 (!) 95/54 115/78 106/70  Pulse: 78 68  75  Resp: 18 15 18 15   Temp: 97.9 F (36.6 C) 98.6 F (37 C)  98.1 F (36.7 C)  TempSrc: Oral     SpO2: 98% 97%  100%  Weight:        Intake/Output Summary (Last 24 hours) at 01/26/2023 1917 Last data filed at 01/26/2023 1859 Gross per 24 hour  Intake --  Output 300 ml  Net -300 ml   Filed Weights   01/20/23 1348  Weight: 65 kg    Examination:   Constitutional: NAD, alert HEENT: conjunctivae and lids normal, EOMI CV: No cyanosis.   RESP: normal respiratory effort, on RA Neuro: II - XII grossly intact.   Psych: Normal mood and affect.  Appropriate    Data Reviewed: I have personally reviewed labs and imaging studies  Time spent: 25 minutes  Darlin Priestly, MD [facility] If 7PM-7AM, please contact night-coverage 01/26/2023, 7:17 PM

## 2023-01-26 NOTE — Progress Notes (Signed)
Physical Therapy Treatment Patient Details Name: Scott Cortez MRN: [id number] DOB: 1933/09/26 Today's Date: 01/26/2023   History of Present Illness Patient is a 89 year old male admitted from home with significant weakness, falls. History of dementia, Parkinson's. COVID +    PT Comments  Pt was laying perpendicular in bed with BLEs over bed rail and head resting on bedside table. Author asked pt what he is doing and he responds with," What does it look like? Im trying to stand up." Pt has poor awareness of situation and extremely poor safety awareness. Max encouragement and vcs throughout session for improved technique,sequencing and safety for all task however pt only willing to perform task in his  own manor. Highly recommend +2 assistance for pt/staff safety. He demonstrates strength required to perform desired task but was unwilling to attempt in safe manor. Pt did stand by pulling up on sink countertop however wide BOS and poor overall posture (back and knees flexed). Eventually pt took a few, lateral, poor quality, steps towards HOB. Due to poor motor planning, pt unable to coordinate sitting and required total assist from author to return pt to seated position at EOB.  Cognition and behaviors greatly impact session progression. Will need continued therapy to maximize independence while decreasing caregiver burden.    If plan is discharge home, recommend the following: A lot of help with walking and/or transfers;A lot of help with bathing/dressing/bathroom;Assistance with cooking/housework;Assistance with feeding;Assist for transportation;Help with stairs or ramp for entrance     Equipment Recommendations  Other (comment) (defer to next level of care)       Precautions / Restrictions Precautions Precautions: Fall Restrictions Weight Bearing Restrictions: No     Mobility  Bed Mobility Overal bed mobility: Needs Assistance Bed Mobility: Supine to Sit, Sit to Supine  Supine to  sit: Max assist Sit to supine: Max assist  General bed mobility comments: pt was laying perpendicular in bed upon arrival. pt states he is tryig to stand up. BLEs over railing attempting to get OOB. He required max vcs to lower bed rail to achieve OOB.    Transfers Overall transfer level: Needs assistance Transfers: Sit to/from Stand Sit to Stand: Mod assist, Max assist   General transfer comment: pt was unwilling to stand in a traditional manor. he insist to pull up on sink to stand. Pt did stand EOB x ~ 10 minute but with poor safety, flexed knees and wide BOS. pt unwilling to follow commands." I'm only doing it my way. I know what I'm doing." pt constantly demands author to perform task that are unsafe. eventually author total assisted pt to sit in bed and then max assisted to return to supine.    Ambulation/Gait Ambulation/Gait assistance: Min assist Gait Distance (Feet): 2 Feet Assistive device: None (pt was holding onto sink countertop for support) Gait Pattern/deviations: Wide base of support, Step-through pattern Gait velocity: decreased  General Gait Details: pt did take a few lateral steps however pt has flexed spine and BLE knees flexed. Extremely high fall risk due to cognition and pt's unwillingness to follow simple commands.    Balance Overall balance assessment: Needs assistance, History of Falls Sitting-balance support: Feet supported, Bilateral upper extremity supported Sitting balance-Leahy Scale: Fair     Standing balance support: Bilateral upper extremity supported, During functional activity Standing balance-Leahy Scale: Poor Standing balance comment: pt cognition/poor safety awareness make him at high risk of falls. HIghly recommended pt use RW but he was unwilling." I'll do it  my way and thats it."      Cognition Arousal: Alert Behavior During Therapy: Anxious, Restless, Impulsive Overall Cognitive Status: History of cognitive impairments - at baseline       General Comments: pt will not follow commands or perform task in desired manor. dicatates session progression and remains extremely confused. High fall risk due to poor safety awareness and poor insight of his abilities               Pertinent Vitals/Pain Pain Assessment Pain Assessment: No/denies pain Pain Score: 0-No pain     PT Goals (current goals can now be found in the care plan section) Acute Rehab PT Goals Patient Stated Goal: none stated Progress towards PT goals: Not progressing toward goals - comment (pt unwillingness to follow simple commands)    Frequency    Min 1X/week       AM-PAC PT "6 Clicks" Mobility   Outcome Measure  Help needed turning from your back to your side while in a flat bed without using bedrails?: A Lot Help needed moving from lying on your back to sitting on the side of a flat bed without using bedrails?: A Lot Help needed moving to and from a bed to a chair (including a wheelchair)?: A Lot Help needed standing up from a chair using your arms (e.g., wheelchair or bedside chair)?: A Lot Help needed to walk in hospital room?: A Lot Help needed climbing 3-5 steps with a railing? : Total 6 Click Score: 11    End of Session Equipment Utilized During Treatment: Gait belt Activity Tolerance: Patient limited by fatigue Patient left: in bed;with call bell/phone within reach;with bed alarm set Nurse Communication: Mobility status PT Visit Diagnosis: Muscle weakness (generalized) (M62.81);Unsteadiness on feet (R26.81)     Time: 1610-9604 PT Time Calculation (min) (ACUTE ONLY): 24 min  Charges:    $Therapeutic Activity: 23-37 mins PT General Charges $$ ACUTE PT VISIT: 1 Visit                     Jetta Lout PTA 01/26/23, 2:47 PM

## 2023-01-26 NOTE — Plan of Care (Signed)

## 2023-01-26 NOTE — Progress Notes (Signed)
Attached purewick to patient, patient is uncooperative and does not want to spread legs threatening this nurse that he will throw his cup with ensure on me. Explained several times that purewick will be placed to prevent him from getting soaked with urine on bed thus making him cold. Of note, patient removed his right sleeve from his arm and his blankets while complaining that he is cold, attempted to put back his sleeves on his arm but he won't let go of the cup and kept threatening this nurse that he will throw it on me, covered his legs with blanket and adjusted thermostat.

## 2023-01-27 DIAGNOSIS — R531 Weakness: Secondary | ICD-10-CM | POA: Diagnosis not present

## 2023-01-27 MED ORDER — QUETIAPINE FUMARATE 25 MG PO TABS
50.0000 mg | ORAL_TABLET | Freq: Every day | ORAL | Status: DC
  Administered 2023-01-27 – 2023-01-28 (×2): 50 mg via ORAL
  Filled 2023-01-27 (×2): qty 2

## 2023-01-27 MED ORDER — CARVEDILOL 3.125 MG PO TABS
3.1250 mg | ORAL_TABLET | Freq: Two times a day (BID) | ORAL | Status: DC
  Filled 2023-01-27: qty 1

## 2023-01-27 NOTE — Plan of Care (Signed)
Problem: Clinical Measurements: Goal: Will remain free from infection Outcome: Progressing   Problem: Nutrition: Goal: Adequate nutrition will be maintained Outcome: Progressing   Problem: Coping: Goal: Level of anxiety will decrease Outcome: Progressing

## 2023-01-27 NOTE — Progress Notes (Signed)
Patient is confused, pulled out his purewick and tangled himself up with cords  of call bell and phone. Call bell put back on side, phone left on chair and cleaned patient.

## 2023-01-27 NOTE — Plan of Care (Signed)
Problem: Clinical Measurements: Goal: Will remain free from infection Outcome: Not Progressing   Problem: Coping: Goal: Level of anxiety will decrease Outcome: Not Progressing   Problem: Safety: Goal: Ability to remain free from injury will improve Outcome: Not Progressing

## 2023-01-27 NOTE — Progress Notes (Signed)
PROGRESS NOTE    Scott Cortez  PIR:[id number] DOB: [birth year] DOA: 01/18/2023 [facility]: [facility]  146A/146A-AA  LOS: 9 days   Brief hospital course:   Assessment & Plan:  Scott Cortez is a 89 y.o. male with medical history significant of coronary artery disease, AAA, BPH, bradycardia, diastolic dysfunction CHF, chronic pancreatitis, depression, GERD, essential hypertension, hyperlipidemia, Parkinson's disease, restless leg syndrome who was brought in from home with significant weakness.  Patient apparently lives alone with his history of dementia and Parkinson's. He is on chronic anticoagulation with Eliquis. APS apparently wanted the patient evaluated.    Vital stable on presentation, COVID-19 PCR came back positive.  Labs with hyponatremia , hypokalemia and mild thrombocytopenia. Chest x-ray negative for any acute abnormality.   9/7: Vital stable, on room air, labs with some leukopenia secondary to COVID infection, sodium 134, hypokalemia resolved, T. bili at 1.6, mildly elevated chronically. Pending PT and OT evaluation.   9/8: PT and OT recommending SNF.  Medically stable now   9/9: Remained hemodynamically stable.  Unfortunately will not be able to go to rehab until 01/29/2023 as he need to complete 10-day quarantine.   9/10: Remained stable-going to SNF on 9/17   * General weakness Likely multifactorial with his advanced Parkinson's and recent COVID infection.  Only had 1 day of diarrhea which has been improved.  No respiratory symptoms. -PT/OT evaluation-recommending SNF   COVID-19 virus infection Supportive care for now.   Parkinson disease -Continue home Sinemet   Hypertension Blood pressure currently low -Home losartan and Lasix is being held due to initial softer blood pressure --reduce coreg to 3.125 BID with hold parameter   Chronic diastolic (congestive) heart failure (HCC) Clinically appears well compensated.  BNP at  170 --reduce coreg to 3.125 BID with hold parameter   CAD (coronary artery disease) No chest pain. -Continue home statin and carvedilol   Atrial fibrillation (HCC) -Continue home Eliquis and carvedilol   Hypokalemia -Continue to monitor and supplement as needed   BPH with obstruction/lower urinary tract symptoms -Continue home [facility] delirium --start seroquel 50 mg nightly    DVT prophylaxis: [id number] Code Status: DNR  Family Communication:  Level of care: Telemetry Medical Dispo:   The patient is from: home Anticipated d/c is to: SNF rehab Anticipated d/c date is: whenever bed available   Subjective and Interval History:  Nursing reported pt becoming more confused, stayed up overnight and sleeping during the day today.     Objective: Vitals:   01/27/23 0230 01/27/23 0436 01/27/23 0732 01/27/23 1602  BP: 103/63  (!) 86/56 (!) 86/55  Pulse: 73  73 71  Resp: 18  16 16   Temp: 97.7 F (36.5 C)  98.1 F (36.7 C) 97.6 F (36.4 C)  TempSrc: Axillary     SpO2: 94%  100% 99%  Weight:  66.2 kg      Intake/Output Summary (Last 24 hours) at 01/27/2023 1715 Last data filed at 01/26/2023 2051 Gross per 24 hour  Intake 150 ml  Output 300 ml  Net -150 ml   Filed Weights   01/20/23 1348 01/27/23 0436  Weight: 65 kg 66.2 kg    Examination:   Constitutional: NAD, sleeping but arousable HEENT: conjunctivae and lids normal, EOMI CV: No cyanosis.   RESP: normal respiratory effort, on RA   Data Reviewed: I have personally reviewed labs and imaging studies  Time spent: 35 minutes  Darlin Priestly, MD [facility] If 7PM-7AM, please contact  night-coverage 01/27/2023, 5:15 PM

## 2023-01-28 DIAGNOSIS — R531 Weakness: Secondary | ICD-10-CM | POA: Diagnosis not present

## 2023-01-28 LAB — URINE CULTURE: Culture: 90000 — AB

## 2023-01-28 NOTE — Plan of Care (Signed)
Problem: Clinical Measurements: Goal: Diagnostic test results will improve Outcome: Progressing Goal: Respiratory complications will improve Outcome: Progressing   Problem: Nutrition: Goal: Adequate nutrition will be maintained Outcome: Progressing   Problem: Elimination: Goal: Will not experience complications related to bowel motility Outcome: Progressing Goal: Will not experience complications related to urinary retention Outcome: Progressing   Problem: Pain Managment: Goal: General experience of comfort will improve Outcome: Progressing

## 2023-01-28 NOTE — TOC Progression Note (Signed)
Transition of Care [facility]) - Progression Note    Patient Details  Name: Scott Cortez MRN: [id number] Date of Birth: [birth year]  [facility]) CM/SW Contact  Marlowe Sax, RN Phone Number: 01/28/2023, 1:42 PM  Clinical Narrative:    Reached out to Archie Patten at [facility] and left a secure Message asking about the patient returning today   Expected Discharge Plan: Skilled Nursing Facility Barriers to Discharge: Continued Medical Work up  Expected Discharge Plan and Services     Post Acute Care Choice: Skilled Nursing Facility Living arrangements for the past 2 months: Single Family Home                                       Social Determinants of Health (SDOH) Interventions SDOH Screenings   Food Insecurity: No Food Insecurity (01/18/2023)  Housing: Low Risk  (01/18/2023)  Transportation Needs: Unmet Transportation Needs (01/18/2023)  Utilities: Not At Risk (01/18/2023)  Tobacco Use: Medium Risk (08/20/2022)   Received from [facility], [facility]    Readmission Risk Interventions     No data to display

## 2023-01-28 NOTE — Progress Notes (Signed)
PROGRESS NOTE    Scott Cortez  UJW:[id number] DOB: 10/30/1933 DOA: 01/18/2023 [facility]  146A/146A-AA  LOS: 10 days   Brief hospital course:   Assessment & Plan:  Scott Cortez is a 89 y.o. male with medical history significant of coronary artery disease, AAA, BPH, bradycardia, diastolic dysfunction CHF, chronic pancreatitis, depression, GERD, essential hypertension, hyperlipidemia, Parkinson's disease, restless leg syndrome who was brought in from home with significant weakness.  Patient apparently lives alone with his history of dementia and Parkinson's. He is on chronic anticoagulation with Eliquis. APS apparently wanted the patient evaluated.    Vital stable on presentation, COVID-19 PCR came back positive.  Labs with hyponatremia , hypokalemia and mild thrombocytopenia. Chest x-ray negative for any acute abnormality.   9/7: Vital stable, on room air, labs with some leukopenia secondary to COVID infection, sodium 134, hypokalemia resolved, T. bili at 1.6, mildly elevated chronically. Pending PT and OT evaluation.   9/8: PT and OT recommending SNF.  Medically stable now   9/9: Remained hemodynamically stable.  Unfortunately will not be able to go to rehab until 01/29/2023 as he need to complete 10-day quarantine.   9/10: Remained stable-going to SNF on 9/17   * General weakness Likely multifactorial with his advanced Parkinson's and recent COVID infection.  Only had 1 day of diarrhea which has been improved.  No respiratory symptoms. -PT/OT evaluation-recommending SNF   COVID-19 virus infection Supportive care for now.   Parkinson disease -Continue home Sinemet   Hypertension Blood pressure currently low -Home losartan and Lasix is being held due to initial softer blood pressure --reduce coreg to 3.125 BID with hold parameter   Chronic diastolic (congestive) heart failure (HCC) Clinically appears well compensated.  BNP at  170 --cont reduced coreg 3.125 BID with hold parameter   CAD (coronary artery disease) No chest pain. -Continue home statin and carvedilol   Atrial fibrillation (HCC) -Continue home Eliquis and carvedilol   Hypokalemia -Continue to monitor and supplement as needed   BPH with obstruction/lower urinary tract symptoms -Continue home [facility] delirium --start seroquel 50 mg nightly    DVT prophylaxis: FA:OZHYQMV Code Status: DNR  Family Communication:  Level of care: Telemetry Medical Dispo:   The patient is from: home Anticipated d/c is to: SNF rehab Anticipated d/c date is: whenever bed available   Subjective and Interval History:  Pt remained somewhat confused.     Objective: Vitals:   01/28/23 0023 01/28/23 0027 01/28/23 0730 01/28/23 1552  BP:  100/73 100/76 97/74  Pulse: 94 84 81 79  Resp:  20 16 14   Temp:   97.6 F (36.4 C) 98.3 F (36.8 C)  TempSrc:      SpO2: 98% 98% 98% 99%  Weight:        Intake/Output Summary (Last 24 hours) at 01/28/2023 1921 Last data filed at 01/28/2023 1700 Gross per 24 hour  Intake --  Output 700 ml  Net -700 ml   Filed Weights   01/20/23 1348 01/27/23 0436  Weight: 65 kg 66.2 kg    Examination:   Constitutional: NAD, alert HEENT: conjunctivae and lids normal, EOMI CV: No cyanosis.   RESP: normal respiratory effort, on RA SKIN: warm, dry Neuro: II - XII grossly intact.     Data Reviewed: I have personally reviewed labs and imaging studies  Time spent: 25 minutes  Darlin Priestly, MD [facility] If 7PM-7AM, please contact night-coverage 01/28/2023, 7:21 PM

## 2023-01-28 NOTE — Progress Notes (Signed)
Mobility Specialist - Progress Note   01/28/23 1419  Mobility  Activity Dangled on edge of bed  Level of Assistance Standby assist, set-up cues, supervision of patient - no hands on  Assistive Device None  Range of Motion/Exercises Active;Right leg;Left leg  Activity Response Tolerated well  $Mobility charge 1 Mobility  Mobility Specialist Start Time (ACUTE ONLY) 1401  Mobility Specialist Stop Time (ACUTE ONLY) 1410  Mobility Specialist Time Calculation (min) (ACUTE ONLY) 9 min   Pt in fowler position upon entry, utilizing RA. Pt denied OOB amb/ transfer, however agreeable to bed level exercises. Pt actively completed therex: Bilat leg raises x10, knee bends x6, ankle circles and pumps. Pt completed bed mob ModI for BLE and HHA to bring trunk upright, right lateral lean upon sitting. Pt dangle EOB for ~2-3 mins before returning supine, requiring MinA to place BLE in bed. Pt left in simi fowler position with alarm set and tray in reach.   Zetta Bills Mobility Specialist 01/28/23 2:26 PM

## 2023-01-29 DIAGNOSIS — R531 Weakness: Secondary | ICD-10-CM | POA: Diagnosis not present

## 2023-01-29 MED ORDER — FOSFOMYCIN TROMETHAMINE 3 G PO PACK
3.0000 g | PACK | Freq: Once | ORAL | Status: AC
  Administered 2023-01-29: 3 g via ORAL
  Filled 2023-01-29: qty 3

## 2023-01-29 MED ORDER — CARVEDILOL 6.25 MG PO TABS: ORAL_TABLET | ORAL | Status: DC

## 2023-01-29 NOTE — Plan of Care (Signed)
Problem: Clinical Measurements: Goal: Respiratory complications will improve Outcome: Progressing Goal: Cardiovascular complication will be avoided Outcome: Progressing   Problem: Nutrition: Goal: Adequate nutrition will be maintained Outcome: Progressing   Problem: Coping: Goal: Level of anxiety will decrease Outcome: Progressing   Problem: Elimination: Goal: Will not experience complications related to bowel motility Outcome: Progressing Goal: Will not experience complications related to urinary retention Outcome: Progressing   Problem: Pain Managment: Goal: General experience of comfort will improve Outcome: Progressing

## 2023-01-29 NOTE — TOC Progression Note (Signed)
Transition of Care [facility]) - Progression Note    Patient Details  Name: Scott Cortez MRN: [id number] Date of Birth: 1934-02-05  [facility]) CM/SW Contact  Marlowe Sax, RN Phone Number: 01/29/2023, 9:42 AM  Clinical Narrative:     Patient is scheduled to DC to [facility] today, I reached out to Tiffany and am awaiting a response   Expected Discharge Plan: Skilled Nursing Facility Barriers to Discharge: Continued Medical Work up  Expected Discharge Plan and Services     Post Acute Care Choice: Skilled Nursing Facility Living arrangements for the past 2 months: Single Family Home                                       Social Determinants of Health (SDOH) Interventions SDOH Screenings   Food Insecurity: No Food Insecurity (01/18/2023)  Housing: Low Risk  (01/18/2023)  Transportation Needs: Unmet Transportation Needs (01/18/2023)  Utilities: Not At Risk (01/18/2023)  Tobacco Use: Medium Risk (08/20/2022)   Received from [facility], [facility]    Readmission Risk Interventions     No data to display

## 2023-01-29 NOTE — Discharge Summary (Signed)
Physician Discharge Summary   Scott Cortez  male DOB: [birth year]  QMV:[id number]  PCP: [facility]  Admit date: 01/18/2023 Discharge date: 01/29/2023  Admitted From: home Disposition:  SNF rehab CODE STATUS: DNR   Hospital Course:  For full details, please see H&P, progress notes, consult notes and ancillary notes.  Briefly,  Scott Cortez is a 89 y.o. male with medical history significant of coronary artery disease, AAA, diastolic dysfunction CHF, chronic pancreatitis, essential hypertension, dementia, Parkinson's disease, who was brought in from home with significant weakness.   Patient apparently lives alone with his history of dementia and Parkinson's.  APS apparently wanted the patient evaluated.    Vital stable on presentation, COVID-19 PCR came back positive.  Chest x-ray negative for any acute abnormality.   * General weakness Likely multifactorial with his advanced Parkinson's and recent COVID infection.  Only had 1 day of diarrhea which has been improved.  No respiratory symptoms. -PT/OT evaluation-recommending SNF, but discharge delayed due to 10-day isolation requirement.   COVID-19 virus infection Supportive care for now.   Parkinson disease -Continue home Sinemet   Hypertension, not currently active --Pt has coreg, lasix, losartan and midodrine on home med list. --Blood pressure has been low in 90's during hospitalization, not able to tolerate any home blood pressure medication. --home lasix, losartan and midodrine d/c'ed. --hold home coreg pending outpatient f/u.   Chronic diastolic (congestive) heart failure (HCC) Clinically appears well compensated.  BNP at 170 --hold home coreg pending outpatient f/u due to hypotension   CAD (coronary artery disease) No chest pain. -Continue home statin  --hold home coreg pending outpatient f/u due to hypotension   Atrial fibrillation (HCC) -Continue home Eliquis  --hold home  coreg pending outpatient f/u due to hypotension   Hypokalemia -Continue to monitor and supplement as needed   BPH with obstruction/lower urinary tract symptoms -Continue home [facility] delirium --received seroquel 50 mg nightly during hospitalization.  Possible UTI --daughter requested checking for UTI since pt complained of burning with urination, which pt only mentioned happened once in one morning. --urine cx with 90,000 COLONIES/mL KLEBSIELLA PNEUMONIAE  and 30,000 COLONIES/mL ENTEROCOCCUS FAECALIS  --pt was treated with fosfomycin 3g x1, which completed tx for both.   Unless noted above, medications under "STOP" list are ones pt was not taking PTA.  Discharge Diagnoses:  Principal Problem:   General weakness Active Problems:   COVID-19 virus infection   Hypertension   Parkinson disease   Chronic diastolic (congestive) heart failure (HCC)   CAD (coronary artery disease)   Atrial fibrillation (HCC)   Hypokalemia   BPH with obstruction/lower urinary tract symptoms   30 Day Unplanned Readmission Risk Score    Flowsheet Row ED to Hosp-Admission (Current) from 01/18/2023 in [facility] (1A)  30 Day Unplanned Readmission Risk Score (%) 18.24 Filed at 01/29/2023 0801       This score is the patient's risk of an unplanned readmission within 30 days of being discharged (0 -100%). The score is based on dignosis, age, lab data, medications, orders, and past utilization.   Low:  0-14.9   Medium: 15-21.9   High: 22-29.9   Extreme: 30 and above         Discharge Instructions:  Allergies as of 01/29/2023       Reactions   Pneumococcal Vaccines Swelling        Medication List     STOP taking these medications  furosemide 20 MG tablet Commonly known as: LASIX   losartan 25 MG tablet Commonly known as: COZAAR   midodrine 5 MG tablet Commonly known as: PROAMATINE   pantoprazole 40 MG tablet Commonly known as: PROTONIX        TAKE these medications    acetaminophen 500 MG tablet Commonly known as: TYLENOL Take 1,000 mg by mouth every 8 (eight) hours as needed for mild pain.   apixaban 5 MG Tabs tablet Commonly known as: ELIQUIS Take 1 tablet (5 mg total) by mouth 2 (two) times daily.   atorvastatin 20 MG tablet Commonly known as: LIPITOR Take 20 mg by mouth daily.   carbidopa-levodopa 50-200 MG tablet Commonly known as: SINEMET CR Take 1 tablet by mouth at bedtime.   carbidopa-levodopa 25-100 MG tablet Commonly known as: SINEMET IR Take 2 tablets by mouth 4 (four) times daily. At 0800, 1100, 1400, 1730   carvedilol 6.25 MG tablet Commonly known as: COREG Hold until outpatient followup due to low blood pressure. What changed:  how much to take how to take this when to take this additional instructions   feeding supplement Liqd Take 237 mLs by mouth 3 (three) times daily between meals.   multivitamin with minerals Tabs tablet Take 1 tablet by mouth daily.   polyethylene glycol 17 g packet Commonly known as: MIRALAX / GLYCOLAX Take 17 g by mouth daily as needed for mild constipation.   rOPINIRole 0.25 MG tablet Commonly known as: REQUIP Take 0.25 mg by mouth at bedtime.   tamsulosin 0.4 MG Caps capsule Commonly known as: FLOMAX Take 1 capsule (0.4 mg total) by mouth daily. What changed:  when to take this additional instructions         Contact information for follow-up providers     [facility] Follow up.   Specialty: General Practice Contact information: [address] [place] [address] [phone]              Contact information for after-discharge care     Destination     [facility] Preferred SNF .   Service: Skilled Nursing Contact information: [address] [place] [address] [phone]                      Allergies  Allergen Reactions   Pneumococcal Vaccines Swelling     The results of significant diagnostics from this hospitalization (including imaging, microbiology, ancillary and laboratory) are listed below for reference.   Consultations:   Procedures/Studies: DG Chest Portable 1 View  Result Date: 01/18/2023 CLINICAL DATA:  Weakness EXAM: PORTABLE CHEST 1 VIEW COMPARISON:  X-ray 07/27/2021.  CT angiogram 06/30/2022 FINDINGS: Calcified and tortuous aorta. Normal cardiopericardial silhouette. No consolidation, pneumothorax or effusion. No edema. Overlapping cardiac leads. Osteopenia and degenerative changes along the spine. IMPRESSION: No acute cardiopulmonary disease.  Calcified and tortuous aorta. Electronically Signed   By: Karen Kays M.D.   On: 01/18/2023 18:17   CT HEAD WO CONTRAST ( )  Result Date: 01/02/2023 CLINICAL DATA:  Head trauma, minor (Age >= 65y), Unwitnessed fall, dementia EXAM: CT HEAD WITHOUT CONTRAST TECHNIQUE: Contiguous axial images were obtained from the base of the skull through the vertex without intravenous contrast. RADIATION DOSE REDUCTION: This exam was performed according to the departmental dose-optimization program which includes automated exposure control, adjustment of the mA and/or kV according to patient size and/or use of iterative reconstruction technique. COMPARISON:  03/25/2022 FINDINGS: Brain: Moderate global parenchymal volume loss, stable since prior examination. Moderate periventricular white matter changes are present, likely reflecting the sequela of small vessel ischemia. Stable mild ventriculomegaly, commensurate with the degree of parenchymal volume loss and in keeping with ex vacuo dilation related to central atrophy. Stable cortical encephalomalacia within the left frontal lobe in keeping with remote infarct. No acute intracranial hemorrhage or infarct. No abnormal mass effect or midline shift. No abnormal intra or extra-axial mass lesion  or fluid collection. Cerebellum unremarkable. Vascular: No hyperdense vessel or unexpected calcification. Skull: Normal. Negative for fracture or focal lesion. Sinuses/Orbits: No acute finding. Other: Mastoid air cells and middle ear cavities are clear. IMPRESSION: 1. No acute intracranial hemorrhage or calvarial fracture. 2. Stable senescent change. 3. Stable remote left frontal lobe infarct. Electronically Signed   By: Helyn Numbers M.D.   On: 01/02/2023 20:02   CT Lumbar Spine Wo Contrast  Result Date: 01/02/2023 CLINICAL DATA:  Unwitnessed fall. History of dementia. Abrasions to elbows. EXAM: CT LUMBAR SPINE WITHOUT CONTRAST TECHNIQUE: Multidetector CT imaging of the lumbar spine was performed without intravenous contrast administration. Multiplanar CT image reconstructions were also generated. RADIATION DOSE REDUCTION: This exam was performed according to the departmental dose-optimization program which includes automated exposure control, adjustment of the mA and/or kV according to patient size and/or use of iterative reconstruction technique. COMPARISON:  CT abdomen and pelvis 06/30/2022 FINDINGS: Segmentation: 5 lumbar type vertebrae. Alignment: No evidence of traumatic malalignment. Vertebrae: Demineralization. No acute fracture. Superior endplate compression of L2 is unchanged from June 30, 2022. Unchanged inferior endplate compression and Schmorl's node of T12. Paraspinal and other soft tissues: Advanced aortic atherosclerotic calcification. 3.4 cm infrarenal abdominal aortic aneurysm, slightly increased from 06/30/2022 when it measured 3.2 cm. Disc levels: Multilevel spondylosis, disc space height loss, degenerative endplate changes greatest at L5-S1 where it is advanced. Moderate multilevel facet arthropathy. Spinal canal narrowing is greatest at L3-L4. Moderate to advanced neural foraminal narrowing on the on the right at L3-L4 and bilaterally at L4-L5. IMPRESSION: 1. No acute fracture in the  lumbar spine. 2. 3.4 cm infrarenal abdominal aortic aneurysm increased from February when it measured 3.2 cm. Recommend follow-up ultrasound every 3 years. This recommendation follows ACR consensus guidelines: White Paper of the ACR Incidental Findings Committee II on Vascular Findings. J Am Coll Radiol 2013; 10:789-794. Electronically Signed   By: Minerva Fester M.D.   On: 01/02/2023 19:51      Labs: BNP (last 3 results) Recent Labs    01/18/23 1602  BNP 170.3*   Basic Metabolic Panel: Recent Labs  Lab 01/25/23 0521  NA 133*  K 4.3  CL 97*  CO2 29  GLUCOSE 99  BUN 23  CREATININE 0.61  CALCIUM 8.7*   Liver Function Tests: No results for input(s): "AST", "ALT", "ALKPHOS", "BILITOT", "PROT", "ALBUMIN" in the last 168 hours. No results for input(s): "LIPASE", "AMYLASE" in the last 168 hours. No results for input(s): "AMMONIA" in the last 168 hours. CBC: Recent Labs  Lab 01/25/23 0521  WBC 4.3  HGB 14.3  HCT 41.5  MCV 92.4  PLT 155   Cardiac Enzymes: No results for input(s): "CKTOTAL", "CKMB", "CKMBINDEX", "TROPONINI" in the last 168 hours. BNP: Invalid input(s): "POCBNP" CBG: No results for input(s): "GLUCAP" in the last 168 hours. D-Dimer No results for input(s): "DDIMER" in the last 72 hours. Hgb A1c No results for input(s): "HGBA1C" in the last 72 hours. Lipid Profile No results for input(s): "CHOL", "HDL", "LDLCALC", "TRIG", "  CHOLHDL", "LDLDIRECT" in the last 72 hours. Thyroid function studies No results for input(s): "TSH", "T4TOTAL", "T3FREE", "THYROIDAB" in the last 72 hours.  Invalid input(s): "FREET3" Anemia work up No results for input(s): "VITAMINB12", "FOLATE", "FERRITIN", "TIBC", "IRON", "RETICCTPCT" in the last 72 hours. Urinalysis    Component Value Date/Time   COLORURINE AMBER (A) 01/25/2023 1534   APPEARANCEUR CLOUDY (A) 01/25/2023 1534   APPEARANCEUR Clear 10/24/2011 2127   LABSPEC 1.027 01/25/2023 1534   LABSPEC 1.003 10/24/2011 2127    PHURINE 6.0 01/25/2023 1534   GLUCOSEU NEGATIVE 01/25/2023 1534   GLUCOSEU Negative 10/24/2011 2127   HGBUR SMALL (A) 01/25/2023 1534   BILIRUBINUR NEGATIVE 01/25/2023 1534   BILIRUBINUR Negative 10/24/2011 2127   KETONESUR 5 (A) 01/25/2023 1534   PROTEINUR NEGATIVE 01/25/2023 1534   NITRITE NEGATIVE 01/25/2023 1534   LEUKOCYTESUR MODERATE (A) 01/25/2023 1534   LEUKOCYTESUR Negative 10/24/2011 2127   Sepsis Labs Recent Labs  Lab 01/25/23 0521  WBC 4.3   Microbiology Recent Results (from the past 240 hour(s))  Urine Culture (for pregnant, neutropenic or urologic patients or patients with an indwelling urinary catheter)     Status: Abnormal   Collection Time: 01/25/23  3:34 PM   Specimen: Urine, Clean Catch  Result Value Ref Range Status   Specimen Description   Final    URINE, CLEAN CATCH Performed at [facility], [address], [place], [place] [address]    Special Requests   Final    NONE Performed at [facility], [address], [place], [place] [address]    Culture (A)  Final    90,000 COLONIES/mL KLEBSIELLA PNEUMONIAE 30,000 COLONIES/mL ENTEROCOCCUS FAECALIS    Report Status 01/28/2023 FINAL  Final   Organism ID, Bacteria KLEBSIELLA PNEUMONIAE (A)  Final   Organism ID, Bacteria ENTEROCOCCUS FAECALIS (A)  Final      Susceptibility   Enterococcus faecalis - MIC*    AMPICILLIN <=2 SENSITIVE Sensitive     NITROFURANTOIN <=16 SENSITIVE Sensitive     VANCOMYCIN 1 SENSITIVE Sensitive     * 30,000 COLONIES/mL ENTEROCOCCUS FAECALIS   Klebsiella pneumoniae - MIC*    AMPICILLIN RESISTANT Resistant     CEFAZOLIN <=4 SENSITIVE Sensitive     CEFEPIME <=0.12 SENSITIVE Sensitive     CEFTRIAXONE <=0.25 SENSITIVE Sensitive     CIPROFLOXACIN <=0.25 SENSITIVE Sensitive     GENTAMICIN <=1 SENSITIVE Sensitive     IMIPENEM <=0.25 SENSITIVE Sensitive     NITROFURANTOIN 64 INTERMEDIATE Intermediate     TRIMETH/SULFA <=20 SENSITIVE Sensitive      AMPICILLIN/SULBACTAM 4 SENSITIVE Sensitive     PIP/TAZO <=4 SENSITIVE Sensitive     * 90,000 COLONIES/mL KLEBSIELLA PNEUMONIAE     Total time spend on discharging this patient, including the last patient exam, discussing the hospital stay, instructions for ongoing care as it relates to all pertinent caregivers, as well as preparing the medical discharge records, prescriptions, and/or referrals as applicable, is 40 minutes.    Darlin Priestly, MD  [facility] 01/29/2023, 11:10 AM

## 2023-01-29 NOTE — TOC Progression Note (Signed)
Transition of Care [facility]) - Progression Note    Patient Details  Name: Scott Cortez MRN: [id number] Date of Birth: 1934-03-30  Transition of Care [facility]) CM/SW Contact  Marlowe Sax, RN Phone Number: 01/29/2023, 10:00 AM  Clinical Narrative:    Received notification that the patient can go to [facility] to room 607   Expected Discharge Plan: Skilled Nursing Facility Barriers to Discharge: Continued Medical Work up  Expected Discharge Plan and Services     Post Acute Care Choice: Skilled Nursing Facility Living arrangements for the past 2 months: Single Family Home                                       Social Determinants of Health (SDOH) Interventions SDOH Screenings   Food Insecurity: No Food Insecurity (01/18/2023)  Housing: Low Risk  (01/18/2023)  Transportation Needs: Unmet Transportation Needs (01/18/2023)  Utilities: Not At Risk (01/18/2023)  Tobacco Use: Medium Risk (08/20/2022)   Received from [facility], [facility]    Readmission Risk Interventions     No data to display

## 2023-01-29 NOTE — Care Management Important Message (Signed)
Important Message  Patient Details  Name: Scott Cortez MRN: [id number] Date of Birth: Feb 02, 1934   Medicare Important Message Given:  Yes     [name] A Kendrix Orman 01/29/2023, 12:17 PM

## 2023-01-29 NOTE — TOC Progression Note (Signed)
Transition of Care [facility]) - Progression Note    Patient Details  Name: Scott Cortez MRN: [id number] Date of Birth: 04-06-1934  Transition of [facility]) CM/SW Contact  Marlowe Sax, RN Phone Number: 01/29/2023, 11:31 AM  Clinical Narrative:     Spoke with the daughter Cala Bradford I notified her that the patient will go to room 607 at [facility] to [facility] to arrange transport He is next on the list  Expected Discharge Plan: Skilled Nursing Facility Barriers to Discharge: Continued Medical Work up  Expected Discharge Plan and Services     Post Acute Care Choice: Skilled Nursing Facility Living arrangements for the past 2 months: Single Family Home Expected Discharge Date: 01/29/23                                     Social Determinants of Health (SDOH) Interventions SDOH Screenings   Food Insecurity: No Food Insecurity (01/18/2023)  Housing: Low Risk  (01/18/2023)  Transportation Needs: Unmet Transportation Needs (01/18/2023)  Utilities: Not At Risk (01/18/2023)  Tobacco Use: Medium Risk (08/20/2022)   Received from [facility], [facility]    Readmission Risk Interventions     No data to display

## 2023-01-29 NOTE — Progress Notes (Signed)
Nurse called over to [facility], report was given to Amy.

## 2023-03-11 ENCOUNTER — Emergency Department: Payer: Medicare Other

## 2023-03-11 ENCOUNTER — Other Ambulatory Visit: Payer: Self-pay

## 2023-03-11 ENCOUNTER — Inpatient Hospital Stay
Admission: EM | Admit: 2023-03-11 | Discharge: 2023-03-15 | DRG: 689 | Disposition: A | Discharge status: Skilled Nursing Facility | Payer: Medicare Other | Attending: Internal Medicine | Admitting: Internal Medicine

## 2023-03-11 DIAGNOSIS — N3000 Acute cystitis without hematuria: Principal | ICD-10-CM | POA: Diagnosis present

## 2023-03-11 DIAGNOSIS — G2581 Restless legs syndrome: Secondary | ICD-10-CM | POA: Diagnosis present

## 2023-03-11 DIAGNOSIS — B961 Klebsiella pneumoniae [K. pneumoniae] as the cause of diseases classified elsewhere: Secondary | ICD-10-CM | POA: Diagnosis present

## 2023-03-11 DIAGNOSIS — R296 Repeated falls: Secondary | ICD-10-CM

## 2023-03-11 DIAGNOSIS — Z20822 Contact with and (suspected) exposure to covid-19: Secondary | ICD-10-CM | POA: Diagnosis present

## 2023-03-11 DIAGNOSIS — N401 Enlarged prostate with lower urinary tract symptoms: Secondary | ICD-10-CM | POA: Diagnosis present

## 2023-03-11 DIAGNOSIS — Z8616 Personal history of COVID-19: Secondary | ICD-10-CM | POA: Diagnosis not present

## 2023-03-11 DIAGNOSIS — I429 Cardiomyopathy, unspecified: Secondary | ICD-10-CM | POA: Diagnosis present

## 2023-03-11 DIAGNOSIS — R54 Age-related physical debility: Secondary | ICD-10-CM

## 2023-03-11 DIAGNOSIS — G9341 Metabolic encephalopathy: Secondary | ICD-10-CM | POA: Diagnosis present

## 2023-03-11 DIAGNOSIS — S0990XA Unspecified injury of head, initial encounter: Principal | ICD-10-CM

## 2023-03-11 DIAGNOSIS — E43 Unspecified severe protein-calorie malnutrition: Secondary | ICD-10-CM | POA: Diagnosis present

## 2023-03-11 DIAGNOSIS — N138 Other obstructive and reflux uropathy: Secondary | ICD-10-CM | POA: Diagnosis present

## 2023-03-11 DIAGNOSIS — Z7901 Long term (current) use of anticoagulants: Secondary | ICD-10-CM

## 2023-03-11 DIAGNOSIS — I110 Hypertensive heart disease with heart failure: Secondary | ICD-10-CM | POA: Diagnosis present

## 2023-03-11 DIAGNOSIS — I4891 Unspecified atrial fibrillation: Secondary | ICD-10-CM | POA: Diagnosis present

## 2023-03-11 DIAGNOSIS — K219 Gastro-esophageal reflux disease without esophagitis: Secondary | ICD-10-CM | POA: Diagnosis present

## 2023-03-11 DIAGNOSIS — F0280 Dementia in other diseases classified elsewhere without behavioral disturbance: Secondary | ICD-10-CM | POA: Diagnosis present

## 2023-03-11 DIAGNOSIS — I5032 Chronic diastolic (congestive) heart failure: Secondary | ICD-10-CM | POA: Diagnosis present

## 2023-03-11 DIAGNOSIS — G912 (Idiopathic) normal pressure hydrocephalus: Secondary | ICD-10-CM | POA: Diagnosis present

## 2023-03-11 DIAGNOSIS — K861 Other chronic pancreatitis: Secondary | ICD-10-CM | POA: Diagnosis present

## 2023-03-11 DIAGNOSIS — E785 Hyperlipidemia, unspecified: Secondary | ICD-10-CM | POA: Diagnosis present

## 2023-03-11 DIAGNOSIS — I4510 Unspecified right bundle-branch block: Secondary | ICD-10-CM | POA: Diagnosis present

## 2023-03-11 DIAGNOSIS — Z87891 Personal history of nicotine dependence: Secondary | ICD-10-CM

## 2023-03-11 DIAGNOSIS — I714 Abdominal aortic aneurysm, without rupture, unspecified: Secondary | ICD-10-CM | POA: Diagnosis present

## 2023-03-11 DIAGNOSIS — G20A1 Parkinson's disease without dyskinesia, without mention of fluctuations: Secondary | ICD-10-CM | POA: Diagnosis present

## 2023-03-11 DIAGNOSIS — N390 Urinary tract infection, site not specified: Secondary | ICD-10-CM

## 2023-03-11 DIAGNOSIS — Z1611 Resistance to penicillins: Secondary | ICD-10-CM | POA: Diagnosis present

## 2023-03-11 DIAGNOSIS — Z6820 Body mass index (BMI) 20.0-20.9, adult: Secondary | ICD-10-CM

## 2023-03-11 DIAGNOSIS — I2510 Atherosclerotic heart disease of native coronary artery without angina pectoris: Secondary | ICD-10-CM | POA: Diagnosis present

## 2023-03-11 DIAGNOSIS — I10 Essential (primary) hypertension: Secondary | ICD-10-CM | POA: Diagnosis present

## 2023-03-11 DIAGNOSIS — Z887 Allergy status to serum and vaccine status: Secondary | ICD-10-CM

## 2023-03-11 DIAGNOSIS — R5381 Other malaise: Secondary | ICD-10-CM | POA: Diagnosis not present

## 2023-03-11 DIAGNOSIS — Z955 Presence of coronary angioplasty implant and graft: Secondary | ICD-10-CM

## 2023-03-11 DIAGNOSIS — U071 COVID-19: Secondary | ICD-10-CM

## 2023-03-11 DIAGNOSIS — I252 Old myocardial infarction: Secondary | ICD-10-CM

## 2023-03-11 DIAGNOSIS — Z751 Person awaiting admission to adequate facility elsewhere: Secondary | ICD-10-CM

## 2023-03-11 DIAGNOSIS — Z79899 Other long term (current) drug therapy: Secondary | ICD-10-CM

## 2023-03-11 LAB — CBC
HCT: 34.6 % — ABNORMAL LOW (ref 39.0–52.0)
Hemoglobin: 11.9 g/dL — ABNORMAL LOW (ref 13.0–17.0)
MCH: 32.2 pg (ref 26.0–34.0)
MCHC: 34.4 g/dL (ref 30.0–36.0)
MCV: 93.5 fL (ref 80.0–100.0)
Platelets: 129 10*3/uL — ABNORMAL LOW (ref 150–400)
RBC: 3.7 MIL/uL — ABNORMAL LOW (ref 4.22–5.81)
RDW: 13.9 % (ref 11.5–15.5)
WBC: 6.9 10*3/uL (ref 4.0–10.5)
nRBC: 0 % (ref 0.0–0.2)

## 2023-03-11 LAB — URINALYSIS, ROUTINE W REFLEX MICROSCOPIC
Bilirubin Urine: NEGATIVE
Glucose, UA: NEGATIVE mg/dL
Hgb urine dipstick: NEGATIVE
Ketones, ur: 5 mg/dL — AB
Nitrite: POSITIVE — AB
Protein, ur: NEGATIVE mg/dL
Specific Gravity, Urine: 1.023 (ref 1.005–1.030)
Squamous Epithelial / HPF: 0 /[HPF] (ref 0–5)
pH: 6 (ref 5.0–8.0)

## 2023-03-11 LAB — BASIC METABOLIC PANEL
Anion gap: 8 (ref 5–15)
BUN: 23 mg/dL (ref 8–23)
CO2: 26 mmol/L (ref 22–32)
Calcium: 8.8 mg/dL — ABNORMAL LOW (ref 8.9–10.3)
Chloride: 103 mmol/L (ref 98–111)
Creatinine, Ser: 0.68 mg/dL (ref 0.61–1.24)
GFR, Estimated: >60 mL/min (ref >60)
Glucose, Bld: 96 mg/dL (ref 70–99)
Potassium: 3.7 mmol/L (ref 3.5–5.1)
Sodium: 137 mmol/L (ref 135–145)

## 2023-03-11 LAB — RESP PANEL BY RT-PCR (RSV, FLU A&B, COVID)  RVPGX2
Influenza A by PCR: NEGATIVE
Influenza B by PCR: NEGATIVE
Resp Syncytial Virus by PCR: NEGATIVE
SARS Coronavirus 2 by RT PCR: POSITIVE — AB

## 2023-03-11 MED ORDER — TAMSULOSIN HCL 0.4 MG PO CAPS
0.4000 mg | ORAL_CAPSULE | Freq: Every day | ORAL | Status: DC
  Administered 2023-03-12 – 2023-03-15 (×3): 0.4 mg via ORAL
  Filled 2023-03-11 (×3): qty 1

## 2023-03-11 MED ORDER — CARBIDOPA-LEVODOPA ER 50-200 MG PO TBCR
1.0000 | EXTENDED_RELEASE_TABLET | Freq: Every day | ORAL | Status: DC
  Administered 2023-03-12 – 2023-03-14 (×4): 1 via ORAL
  Filled 2023-03-11 (×5): qty 1

## 2023-03-11 MED ORDER — CARVEDILOL 6.25 MG PO TABS
6.2500 mg | ORAL_TABLET | Freq: Two times a day (BID) | ORAL | Status: DC
  Administered 2023-03-12 – 2023-03-15 (×6): 6.25 mg via ORAL
  Filled 2023-03-11 (×6): qty 1

## 2023-03-11 MED ORDER — ONDANSETRON HCL 4 MG/2ML IJ SOLN: 4.0000 mg | Freq: Four times a day (QID) | INTRAMUSCULAR | Status: DC | PRN

## 2023-03-11 MED ORDER — ROPINIROLE HCL 0.25 MG PO TABS
0.2500 mg | ORAL_TABLET | Freq: Every day | ORAL | Status: DC
  Administered 2023-03-12 – 2023-03-14 (×4): 0.25 mg via ORAL
  Filled 2023-03-11 (×5): qty 1

## 2023-03-11 MED ORDER — ENSURE ENLIVE PO LIQD
237.0000 mL | Freq: Three times a day (TID) | ORAL | Status: DC
  Administered 2023-03-12 – 2023-03-15 (×11): 237 mL via ORAL

## 2023-03-11 MED ORDER — ATORVASTATIN CALCIUM 20 MG PO TABS
20.0000 mg | ORAL_TABLET | Freq: Every day | ORAL | Status: DC
  Administered 2023-03-12 – 2023-03-15 (×4): 20 mg via ORAL
  Filled 2023-03-11 (×4): qty 1

## 2023-03-11 MED ORDER — ACETAMINOPHEN 650 MG RE SUPP: 650.0000 mg | Freq: Four times a day (QID) | RECTAL | Status: DC | PRN

## 2023-03-11 MED ORDER — SODIUM CHLORIDE 0.9 % IV SOLN
1.0000 g | INTRAVENOUS | Status: DC
  Administered 2023-03-12 – 2023-03-13 (×2): 1 g via INTRAVENOUS
  Filled 2023-03-11 (×2): qty 10

## 2023-03-11 MED ORDER — CARBIDOPA-LEVODOPA 25-100 MG PO TABS
2.0000 | ORAL_TABLET | Freq: Four times a day (QID) | ORAL | Status: DC
  Administered 2023-03-12 – 2023-03-15 (×15): 2 via ORAL
  Filled 2023-03-11 (×15): qty 2

## 2023-03-11 MED ORDER — SODIUM CHLORIDE 0.9 % IV SOLN
2.0000 g | INTRAVENOUS | Status: DC
  Administered 2023-03-11: 2 g via INTRAVENOUS
  Filled 2023-03-11: qty 20

## 2023-03-11 MED ORDER — ACETAMINOPHEN 325 MG PO TABS: 650.0000 mg | ORAL_TABLET | Freq: Four times a day (QID) | ORAL | Status: DC | PRN

## 2023-03-11 MED ORDER — ONDANSETRON HCL 4 MG PO TABS: 4.0000 mg | ORAL_TABLET | Freq: Four times a day (QID) | ORAL | Status: DC | PRN

## 2023-03-11 MED ORDER — APIXABAN 5 MG PO TABS
5.0000 mg | ORAL_TABLET | Freq: Two times a day (BID) | ORAL | Status: DC
  Administered 2023-03-12 – 2023-03-15 (×8): 5 mg via ORAL
  Filled 2023-03-11 (×8): qty 1

## 2023-03-11 NOTE — ED Provider Notes (Signed)
[facility] Provider Note    Event Date/Time   First MD Initiated Contact with Patient 03/11/23 1942     (approximate)   History   Fall   HPI  Scott Cortez is a 89 year old male with history of CAD, CHF, HTN, dementia, Parkinson's presenting to the emergency department for evaluation of weakness.  Accompanied by his significant other who provides collateral history.  Patient recently discharged from [facility].  He is now living by himself at home and has had multiple falls since returning home.  Today, he fell and was unable to get himself off of the ground.  He additionally has become incontinent of urine and appears confused from his baseline though he does have a history of dementia.  No fevers or chills.  They do report that social services previously felt that patient was unsafe to live at home by himself, somewhat unclear circumstances under which patient was discharged home from [facility].  Patient currently without complaints, unsure if he hit his head when he fell.  Review DC summary from 01/29/2023.  At that time presented with generalized weakness thought to be related to COVID infection after which SNF was recommended.     Physical Exam   Triage Vital Signs: ED Triage Vitals [03/11/23 1548]  Encounter Vitals Group     BP 131/86     Systolic BP Percentile      Diastolic BP Percentile      Pulse Rate 97     Resp 18     Temp 98.1 F (36.7 C)     Temp Source Oral     SpO2 95 %     Weight      Height      Head Circumference      Peak Flow      Pain Score      Pain Loc      Pain Education      Exclude from Growth Chart     Most recent vital signs: Vitals:   03/11/23 2249 03/11/23 2316  BP: 113/77 112/80  Pulse: 62 76  Resp: 18 17  Temp: 97.7 F (36.5 C) 98.2 F (36.8 C)  SpO2: 97% 98%   Nursing notes and vital signs reviewed.  General: Adult male, laying in bed, awake interactive Head: Atraumatic Chest:  Symmetric chest rise, no tenderness to palpation.  Cardiac: Regular rhythm and rate.  Respiratory: Lungs clear to auscultation Abdomen: Soft, nondistended. No tenderness to palpation.  Pelvis: Stable in AP and lateral compression. No tenderness to palpation. MSK: No deformity to bilateral upper and lower extremity. Full range of motion to bilateral upper lower extremity with no pain. Neuro: Alert, oriented to self, mild confusion.  5 out of 5 strength in bilateral upper and lower extremities. Normal sensation to light touch in bilateral upper and lower extremity. Skin: No evidence of burns or lacerations.  ED Results / Procedures / Treatments   Labs (all labs ordered are listed, but only abnormal results are displayed) Labs Reviewed  RESP PANEL BY RT-PCR (RSV, FLU A&B, COVID)  RVPGX2 - Abnormal; Notable for the following components:      Result Value   SARS Coronavirus 2 by RT PCR POSITIVE (*)    All other components within normal limits  BASIC METABOLIC PANEL - Abnormal; Notable for the following components:   Calcium 8.8 (*)    All other components within normal limits  CBC - Abnormal; Notable for the following components:  RBC 3.70 (*)    Hemoglobin 11.9 (*)    HCT 34.6 (*)    Platelets 129 (*)    All other components within normal limits  URINALYSIS, ROUTINE W REFLEX MICROSCOPIC - Abnormal; Notable for the following components:   Color, Urine AMBER (*)    APPearance HAZY (*)    Ketones, ur 5 (*)    Nitrite POSITIVE (*)    Leukocytes,Ua TRACE (*)    Bacteria, UA MANY (*)    All other components within normal limits  URINE CULTURE  THYROID PANEL WITH TSH  RPR  VITAMIN B1  CBG MONITORING, ED     EKG EKG independently reviewed interpreted by myself (ER attending) demonstrates:  EKG demonstrates A-fib at a rate of 82, QRS 110, QTc 472, no acute ST changes  RADIOLOGY Imaging independently reviewed and interpreted by myself demonstrates:  CT head without acute bleed,  but does note disproportionate enlargement of the ventricles concerning for possible normal pressure hydrocephalus CT C-spine without acute fracture  PROCEDURES:  Critical Care performed: No  Procedures   MEDICATIONS ORDERED IN ED: Medications  atorvastatin (LIPITOR) tablet 20 mg (has no administration in time range)  carvedilol (COREG) tablet 6.25 mg (has no administration in time range)  tamsulosin (FLOMAX) capsule 0.4 mg (has no administration in time range)  carbidopa-levodopa (SINEMET CR) 50-200 MG per tablet controlled release 1 tablet (has no administration in time range)  apixaban (ELIQUIS) tablet 5 mg (has no administration in time range)  carbidopa-levodopa (SINEMET IR) 25-100 MG per tablet immediate release 2 tablet (has no administration in time range)  rOPINIRole (REQUIP) tablet 0.25 mg (has no administration in time range)  feeding supplement (ENSURE ENLIVE / ENSURE PLUS) liquid 237 mL (has no administration in time range)  cefTRIAXone (ROCEPHIN) 1 g in sodium chloride 0.9 % 100 mL IVPB (has no administration in time range)  acetaminophen (TYLENOL) tablet 650 mg (has no administration in time range)    Or  acetaminophen (TYLENOL) suppository 650 mg (has no administration in time range)  ondansetron (ZOFRAN) tablet 4 mg (has no administration in time range)    Or  ondansetron (ZOFRAN) injection 4 mg (has no administration in time range)     IMPRESSION / MDM / ASSESSMENT AND PLAN / ED COURSE  I reviewed the triage vital signs and the nursing notes.  Differential diagnosis includes, but is not limited to, anemia, electrolyte abnormality, UTI, intracranial bleed, spine fracture, COVID infection  Patient's presentation is most consistent with acute presentation with potential threat to life or bodily function.  89 year old male presenting with generalized weakness and confusion.  Vital signs stable on presentation.  Last in triage with mild anemia with hemoglobin of  11.9.  Urinalysis did return concerning for infection with positive nitrites, 21-50 white blood cells, many bacteria.  Does not meet SIRS criteria, antibiotics with Rocephin ordered.  COVID swab also returned positive.  CT scans without acute trauma, but does note enlarged ventricles concerning for possible normal pressure hydrocephalus.  Patient does seem to have gait instability and more recently incontinence, though unclear if this is partly related to his mobility.  Multiple possible etiologies for his weakness including COVID infection, UTI.  With his multiple falls and mild encephalopathy, do think he is appropriate for admission.  Will reach out to hospitalist team.  Case discussed with hospitalist team.  They will evaluate the patient for anticipated admission.     FINAL CLINICAL IMPRESSION(S) / ED DIAGNOSES   Final diagnoses:  Closed head injury, initial encounter  COVID-19  Acute cystitis without hematuria     Rx / DC Orders   ED Discharge Orders     None        Note:  This document was prepared using Dragon voice recognition software and may include unintentional dictation errors.   Trinna Post, MD 03/12/23 [phone]

## 2023-03-11 NOTE — Assessment & Plan Note (Signed)
Clinically euvolemic Continue carvedilol

## 2023-03-11 NOTE — Assessment & Plan Note (Signed)
Patient with acute confusion beyond baseline, mentation seems improving Possibly related to new UTI, recent COVID, underlying dementia NPH Delirium precautions, fall precautions Treat UTI

## 2023-03-11 NOTE — Assessment & Plan Note (Signed)
Continue carvedilol 

## 2023-03-11 NOTE — Assessment & Plan Note (Signed)
Continue carvedilol and apixaban ?

## 2023-03-11 NOTE — Assessment & Plan Note (Signed)
NPH on CT Parkinson disease/dementia Frailty and advanced age/physical deconditioning Unable to care for self PT and TOC consult Consider neurosurg consult for NPH seen on CT TSH and B12 and RPR ordered

## 2023-03-11 NOTE — H&P (Signed)
History and Physical    Patient: Scott Cortez MWN:[id number] DOB: 16-Aug-1933 DOA: 03/11/2023 DOS: the patient was seen and examined on 03/11/2023 PCP: [facility]  Patient coming from: Home  Chief Complaint:  Chief Complaint  Patient presents with   Fall    HPI: Scott Cortez is a 89 y.o. male with medical history significant for Being admitted for UTI, confusion,frequent falls following a recent discharge from rehab.  Patient has a history of coronary artery disease, AAA, diastolic dysfunction CHF, chronic pancreatitis, essential hypertension, dementia, Parkinson's disease, chronic physical deconditioning, and lived independently until a hospitalization from 9/6 to 01/29/23 for multifactorial weakness,COVID and UTI (<100,000 colonies Klebsiella and Enterococcus)  Patient previously had an APS evaluation prior to the hospitalization but was discharged home from the rehab facility.  Following his return home he went on to have multiple falls, most recently on the day of arrival when he was unable to get himself up off the ground.  He is also reportedly confused and has been incontinent of urine. ED course and data review: Vitals within normal limits. Labs: COVID remains positive from 01/18/2023 CBC and BMP unremarkable Urinalysis with positive nitrite and many bacteria EKG, personally viewed and interpreted showing A-fib at 84 with incomplete RBBB Head CT nonacute but consistent with NPH C-spine CT nonacute Patient started on ceftriaxone for UTI Hospitalist consulted for admission.   Review of Systems: {ROS_Text:26778}  Past Medical History:  Diagnosis Date   AAA (abdominal aortic aneurysm) (HCC)    a.) CTA 12/31/2015: measured 3.1 cm. b.) CTA 03/07/2019: measured 3.5 cm. c.) CT abdomen 05/05/2021: measured  2.8 cm. d.) CTA 05/17/2021: measured 3.2 cm   Acute MI, inferior wall (HCC) 11/19/2003   a.) LHC 11/22/2003: EF 49%; 20% pRCA, 90% mRCA, 20%  LM, 20% pLCx, 20% mLAD, 20% D1; unable to perform PCI --> transferred to [facility] and stent (unknown type) was placed to the RCA.   Aortic atherosclerosis (HCC)    Atrial fibrillation (HCC)    a.) CHA2DS2-VASc = 5 (age x 2, CHF, HTN, previous MI/aortic plaque). b.) rate/hythm maintained on oral carvedilol; chronically anticoagulated using apixaban.   Bladder neck obstruction    BPH (benign prostatic hyperplasia)    Bradycardia    Cardiac murmur    Cardiomyopathy (HCC)    CHF (congestive heart failure) (HCC)    a.) TTE 01/05/2014: EF 50%; mild LA enlargement; triv AR//PR, mild MR/TR; G1DD. b.) TTE 03/08/2019: EF 60-65%; RV mildly enlarged. c.) TTE 09/26/2020: EF 30%; mild LVH; mild LA enlargement; mild MR/TR/PR.   Chronic pancreatitis (HCC)    Coronary artery disease    a.) inferior wall MI 11/19/2003; LHC 11/22/2003: EF 49%; 20% pRCA, 90% mRCA, 20% LM, 20% pLCx, 20% mLAD, 20% D1; unable to perform PCI --> transferred to [facility] and stent (unknown type) was placed to the RCA. b.) MI 2007/2008 (date unknown); PCI placing a second stent (unknown type) to RCA. c.) LHC 02/02/2009: 50% RPLS; intervention deferred opting for med mgmt.   Current use of long term anticoagulation    a.) apixaban   Dental bridge present    upper - bilateral   Depression    Frequent falls    GERD (gastroesophageal reflux disease)    Glaucoma    Hyperlipidemia    Hypertension    Iliac artery aneurysm, left (HCC)    a.)  CTA 05/17/2021: measured 1.7 cm; previously 1.5 cm in 2015.   MI (myocardial infarction) (HCC) 05/02/2007   a.) inferoposterolateral  MI 05/02/2007 - PCI placing a stent (unknown type) to the RCA   Neuropathy of both feet    Osteoarthritis    Parkinson's disease (HCC)    RLS (restless legs syndrome)    a.) takes ropinirole   Past Surgical History:  Procedure Laterality Date   CATARACT EXTRACTION W/PHACO Left 02/02/2019   Procedure: CATARACT EXTRACTION PHACO AND INTRAOCULAR LENS PLACEMENT (IOC) LEFT  ISTENT INJ AND ISTENT 01:09.1        11.3%          8.47;  Surgeon: Nevada Crane, MD;  Location: [facility];  Service: Ophthalmology;  Laterality: Left;   CATARACT EXTRACTION W/PHACO Right 02/23/2019   Procedure: CATARACT EXTRACTION PHACO AND INTRAOCULAR LENS PLACEMENT (IOC) RIGHT ISTENT INJ 01:09.1  14.1%  9.78;  Surgeon: Nevada Crane, MD;  Location: [facility];  Service: Ophthalmology;  Laterality: Right;   COLONOSCOPY N/A 10/15/2014   Procedure: COLONOSCOPY;  Surgeon: Scot Jun, MD;  Location: [facility] END[facility]COPY;  Service: Endoscopy;  Laterality: N/A;   CORONARY ANGIOPLASTY WITH STENT PLACEMENT Left 11/16/2003   PCI and stent placement to the RCA (unknown type)   CORONARY ANGIOPLASTY WITH STENT PLACEMENT Left 05/02/2007   PCI with stenting of the RCA (unknown type)   ESOPHAGOGASTRODUODENOSCOPY (EGD) WITH PROPOFOL N/A 10/15/2014   Procedure: ESOPHAGOGASTRODUODENOSCOPY (EGD) WITH PROPOFOL;  Surgeon: Scot Jun, MD;  Location[facility] END[facility];  Service: Endoscopy;  Laterality: N/A;   INSERTION OF ANTERIOR SEGMENT AQUEOUS DRAINAGE DEVICE (ISTENT) Left 02/02/2019   Procedure: INSERTION OF ANTERIOR SEGMENT AQUEOUS DRAINAGE DEVICE (ISTENT);  Surgeon: Nevada Crane, MD;  Location: [facility];  Service: Ophthalmology;  Laterality: Left;   LEFT HEART CATH AND CORONARY ANGIOGRAPHY Left 02/02/2009   Procedure: LEFT HEART CATH AND CORONARY ANGIOGRAPHY; Location: [facility]; Surgeon: Rudean Hitt, MD   SAVORY DILATION N/A 10/15/2014   Procedure: Gaspar Bidding DILATION;  Surgeon: Scot Jun, MD;  Location: [facility] ENDOSCOPY;  Service: Endoscopy;  Laterality: N/A;   XI ROBOTIC ASSISTED INGUINAL HERNIA REPAIR WITH MESH  07/27/2021   Procedure: XI ROBOTIC ASSISTED INGUINAL HERNIA REPAIR WITH MESH;  Surgeon: Sung Amabile, DO;  Location: [facility] ORS;  Service: General;;   Social History:  reports that he quit smoking about 49 years ago. His smoking use included cigarettes.  He has never used smokeless tobacco. He reports that he does not currently use drugs after having used the following drugs: Marijuana. He reports that he does not drink alcohol.  Allergies  Allergen Reactions   Pneumococcal Vaccines Swelling    History reviewed. No pertinent family history.  Prior to Admission medications   Medication Sig Start Date End Date Taking? Authorizing Provider  acetaminophen (TYLENOL) 500 MG tablet Take 1,000 mg by mouth every 8 (eight) hours as needed for mild pain.    [provider]  apixaban (ELIQUIS) 5 MG TABS tablet Take 1 tablet (5 mg total) by mouth 2 (two) times daily. 03/12/19   Delfino Lovett, MD  atorvastatin (LIPITOR) 20 MG tablet Take 20 mg by mouth daily.    [provider]  carbidopa-levodopa (SINEMET CR) 50-200 MG tablet Take 1 tablet by mouth at bedtime.    [provider]  carbidopa-levodopa (SINEMET IR) 25-100 MG tablet Take 2 tablets by mouth 4 (four) times daily. At 0800, 1100, 1400, 1730    [provider]  carvedilol (COREG) 6.25 MG tablet Hold until outpatient followup due to low blood pressure. 01/29/23   Darlin Priestly, MD  feeding supplement (  ENSURE ENLIVE / ENSURE PLUS) LIQD Take 237 mLs by mouth 3 (three) times daily between meals. 03/30/22   Kathlen Mody, MD  Multiple Vitamin (MULTIVITAMIN WITH MINERALS) TABS tablet Take 1 tablet by mouth daily. 03/31/22   Kathlen Mody, MD  polyethylene glycol (MIRALAX / GLYCOLAX) 17 g packet Take 17 g by mouth daily as needed for mild constipation.    [provider]  rOPINIRole (REQUIP) 0.25 MG tablet Take 0.25 mg by mouth at bedtime.    [provider]  tamsulosin (FLOMAX) 0.4 MG CAPS capsule Take 1 capsule (0.4 mg total) by mouth daily. Patient taking differently: Take 0.4 mg by mouth daily with supper. At 1700 09/12/20   [name], MD    Physical Exam: Vitals:   03/11/23 1548  BP: 131/86  Pulse: 97  Resp: 18  Temp: 98.1 F (36.7 C)   TempSrc: Oral  SpO2: 95%   Physical Exam  Labs on Admission: I have personally reviewed following labs and imaging studies  CBC: Recent Labs  Lab 03/11/23 1548  WBC 6.9  HGB 11.9*  HCT 34.6*  MCV 93.5  PLT 129*   Basic Metabolic Panel: Recent Labs  Lab 03/11/23 1548  NA 137  K 3.7  CL 103  CO2 26  GLUCOSE 96  BUN 23  CREATININE 0.68  CALCIUM 8.8*   GFR: CrCl cannot be calculated (Unknown ideal weight.). Liver Function Tests: No results for input(s): "AST", "ALT", "ALKPHOS", "BILITOT", "PROT", "ALBUMIN" in the last 168 hours. No results for input(s): "LIPASE", "AMYLASE" in the last 168 hours. No results for input(s): "AMMONIA" in the last 168 hours. Coagulation Profile: No results for input(s): "INR", "PROTIME" in the last 168 hours. Cardiac Enzymes: No results for input(s): "CKTOTAL", "CKMB", "CKMBINDEX", "TROPONINI" in the last 168 hours. BNP (last 3 results) No results for input(s): "PROBNP" in the last 8760 hours. HbA1C: No results for input(s): "HGBA1C" in the last 72 hours. CBG: No results for input(s): "GLUCAP" in the last 168 hours. Lipid Profile: No results for input(s): "CHOL", "HDL", "LDLCALC", "TRIG", "CHOLHDL", "LDLDIRECT" in the last 72 hours. Thyroid Function Tests: No results for input(s): "TSH", "T4TOTAL", "FREET4", "T3FREE", "THYROIDAB" in the last 72 hours. Anemia Panel: No results for input(s): "VITAMINB12", "FOLATE", "FERRITIN", "TIBC", "IRON", "RETICCTPCT" in the last 72 hours. Urine analysis:    Component Value Date/Time   COLORURINE AMBER (A) 03/11/2023 2029   APPEARANCEUR HAZY (A) 03/11/2023 2029   APPEARANCEUR Clear 10/24/2011 2127   LABSPEC 1.023 03/11/2023 2029   LABSPEC 1.003 10/24/2011 2127   PHURINE 6.0 03/11/2023 2029   GLUCOSEU NEGATIVE 03/11/2023 2029   GLUCOSEU Negative 10/24/2011 2127   HGBUR NEGATIVE 03/11/2023 2029   BILIRUBINUR NEGATIVE 03/11/2023 2029   BILIRUBINUR Negative 10/24/2011 2127   KETONESUR 5 (A)  03/11/2023 2029   PROTEINUR NEGATIVE 03/11/2023 2029   NITRITE POSITIVE (A) 03/11/2023 2029   LEUKOCYTESUR TRACE (A) 03/11/2023 2029   LEUKOCYTESUR Negative 10/24/2011 2127    Radiological Exams on Admission: CT HEAD WO CONTRAST ( )  Result Date: 03/11/2023 CLINICAL DATA:  Fall. EXAM: CT HEAD WITHOUT CONTRAST CT CERVICAL SPINE WITHOUT CONTRAST TECHNIQUE: Multidetector CT imaging of the head and cervical spine was performed following the standard protocol without intravenous contrast. Multiplanar CT image reconstructions of the cervical spine were also generated. RADIATION DOSE REDUCTION: This exam was performed according to the departmental dose-optimization program which includes automated exposure control, adjustment of the mA and/or kV according to patient size and/or use of iterative reconstruction technique. COMPARISON:  Head CT 01/02/2023. FINDINGS: CT HEAD FINDINGS Brain: No acute hemorrhage. 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 Cortical gray-white differentiation is otherwise preserved. Disproportionate enlargement of the lateral and third ventricles relative to the sulci with acute callosal angle and elevated Evans index. No extra-axial collection, mass effect or midline shift. Vascular: No hyperdense vessel or unexpected calcification. Skull: No calvarial fracture or suspicious bone lesion. Skull base is unremarkable. Sinuses/Orbits: No acute finding. Other: None. CT CERVICAL SPINE FINDINGS Alignment: Normal. Skull base and vertebrae: No acute fracture. Normal craniocervical junction. No suspicious bone lesions. Soft tissues and spinal canal: No prevertebral fluid or swelling. No visible canal hematoma. Disc levels: Multilevel cervical spondylosis, worst at C6-7, where there is no more than mild spinal canal stenosis. Upper chest: No acute findings. Other: Atherosclerotic calcifications of the carotid bulbs. IMPRESSION: 1. No acute  intracranial abnormality. 2. Disproportionate enlargement of the lateral and third ventricles relative to the sulci with acute callosal angle and elevated Evans index, which can be seen in the setting of normal pressure hydrocephalus. 3. No acute cervical spine fracture or traumatic listhesis. Electronically Signed   By: Orvan Falconer M.D.   On: 03/11/2023 17:28   CT Cervical Spine Wo Contrast  Result Date: 03/11/2023 CLINICAL DATA:  Fall. EXAM: CT HEAD WITHOUT CONTRAST CT CERVICAL SPINE WITHOUT CONTRAST TECHNIQUE: Multidetector CT imaging of the head and cervical spine was performed following the standard protocol without intravenous contrast. Multiplanar CT image reconstructions of the cervical spine were also generated. RADIATION DOSE REDUCTION: This exam was performed according to the departmental dose-optimization program which includes automated exposure control, adjustment of the mA and/or kV according to patient size and/or use of iterative reconstruction technique. COMPARISON:  Head CT 01/02/2023. FINDINGS: CT HEAD FINDINGS Brain: No acute hemorrhage. 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 Cortical gray-white differentiation is otherwise preserved. Disproportionate enlargement of the lateral and third ventricles relative to the sulci with acute callosal angle and elevated Evans index. No extra-axial collection, mass effect or midline shift. Vascular: No hyperdense vessel or unexpected calcification. Skull: No calvarial fracture or suspicious bone lesion. Skull base is unremarkable. Sinuses/Orbits: No acute finding. Other: None. CT CERVICAL SPINE FINDINGS Alignment: Normal. Skull base and vertebrae: No acute fracture. Normal craniocervical junction. No suspicious bone lesions. Soft tissues and spinal canal: No prevertebral fluid or swelling. No visible canal hematoma. Disc levels: Multilevel cervical spondylosis, worst at C6-7, where there is  no more than mild spinal canal stenosis. Upper chest: No acute findings. Other: Atherosclerotic calcifications of the carotid bulbs. IMPRESSION: 1. No acute intracranial abnormality. 2. Disproportionate enlargement of the lateral and third ventricles relative to the sulci with acute callosal angle and elevated Evans index, which can be seen in the setting of normal pressure hydrocephalus. 3. No acute cervical spine fracture or traumatic listhesis. Electronically Signed   By: Orvan Falconer M.D.   On: 03/11/2023 17:28     Data Reviewed: Relevant notes from primary care and specialist visits, past discharge summaries as available in EHR, including Care Everywhere. Prior diagnostic testing as pertinent to current admission diagnoses Updated medications and problem lists for reconciliation ED course, including vitals, labs, imaging, treatment and response to treatment Triage notes, nursing and pharmacy notes and ED provider's notes Notable results as noted in HPI   Assessment and Plan: * Frequent falls NPH on CT Parkinson disease/dementia Frailty and advanced age/physical deconditioning Unable to care for self PT  and TOC consult Consider neurosurg consult for NPH seen on CT TSH and B12 and RPR ordered   Acute metabolic encephalopathy Patient with acute confusion beyond baseline Possibly related to new UTI, recent COVID, underlying dementia NPH Delirium precautions, fall precautions Treat UTI  Urinary tract infection Recent UTI 01/18/2023 (<100,000 colonies Klebsiella and Enterococcus)  Continue Rocephin Follow cultures  Parkinson disease (HCC) Continue Sinemet and ropinirole  Hypertension Continue carvedilol  Chronic diastolic (congestive) heart failure (HCC) Clinically euvolemic Continue carvedilol  CAD (coronary artery disease) Continue carvedilol atorvastatin and apixaban  Atrial fibrillation (HCC) Continue carvedilol and apixaban  BPH with obstruction/lower urinary  tract symptoms Continue tamsulosin    DVT prophylaxis: apixaban  Consults: none  Advance Care Planning:   Code Status: Prior suspect SNF  Family Communication: none  Disposition Plan: Back to previous home environment  Severity of Illness: The appropriate patient status for this patient is INPATIENT. Inpatient status is judged to be reasonable and necessary in order to provide the required intensity of service to ensure the patient's safety. The patient's presenting symptoms, physical exam findings, and initial radiographic and laboratory data in the context of their chronic comorbidities is felt to place them at high risk for further clinical deterioration. Furthermore, it is not anticipated that the patient will be medically stable for discharge from the hospital within 2 midnights of admission.   * I certify that at the point of admission it is my clinical judgment that the patient will require inpatient hospital care spanning beyond 2 midnights from the point of admission due to high intensity of service, high risk for further deterioration and high frequency of surveillance required.*  Author: Andris Baumann, MD 03/11/2023 10:17 PM  For on call review [email or web address].

## 2023-03-11 NOTE — Assessment & Plan Note (Signed)
Continue carvedilol atorvastatin and apixaban

## 2023-03-11 NOTE — Assessment & Plan Note (Signed)
Continue tamsulosin.

## 2023-03-11 NOTE — Assessment & Plan Note (Signed)
Continue Sinemet and ropinirole

## 2023-03-11 NOTE — Assessment & Plan Note (Signed)
Recent UTI 01/18/2023 (<100,000 colonies Klebsiella and Enterococcus)  Continue Rocephin Follow cultures

## 2023-03-11 NOTE — ED Triage Notes (Signed)
Pt comes via EMS from home with c/o multiple falls. Pt was at [facility] for hip pain and recently dc home. Pt unable to care for self at home per EMS. Family to be notified by doctor of issues.  Pt is on thinners.

## 2023-03-11 NOTE — ED Notes (Signed)
Informed RN Toma Copier via chat/ Pt has bed assigned

## 2023-03-12 DIAGNOSIS — G9341 Metabolic encephalopathy: Secondary | ICD-10-CM | POA: Diagnosis not present

## 2023-03-12 DIAGNOSIS — I5032 Chronic diastolic (congestive) heart failure: Secondary | ICD-10-CM

## 2023-03-12 DIAGNOSIS — N3000 Acute cystitis without hematuria: Principal | ICD-10-CM

## 2023-03-12 DIAGNOSIS — R5381 Other malaise: Secondary | ICD-10-CM

## 2023-03-12 DIAGNOSIS — G20A1 Parkinson's disease without dyskinesia, without mention of fluctuations: Secondary | ICD-10-CM

## 2023-03-12 DIAGNOSIS — G912 (Idiopathic) normal pressure hydrocephalus: Secondary | ICD-10-CM

## 2023-03-12 DIAGNOSIS — I4891 Unspecified atrial fibrillation: Secondary | ICD-10-CM

## 2023-03-12 DIAGNOSIS — N138 Other obstructive and reflux uropathy: Secondary | ICD-10-CM

## 2023-03-12 DIAGNOSIS — N401 Enlarged prostate with lower urinary tract symptoms: Secondary | ICD-10-CM

## 2023-03-12 DIAGNOSIS — R296 Repeated falls: Secondary | ICD-10-CM | POA: Diagnosis not present

## 2023-03-12 LAB — RPR: RPR Ser Ql: NONREACTIVE

## 2023-03-12 MED ORDER — ORAL CARE MOUTH RINSE: 15.0000 mL | OROMUCOSAL | Status: DC | PRN

## 2023-03-12 MED ORDER — ADULT MULTIVITAMIN W/MINERALS CH
1.0000 | ORAL_TABLET | Freq: Every day | ORAL | Status: DC
  Administered 2023-03-13 – 2023-03-15 (×3): 1 via ORAL
  Filled 2023-03-12 (×3): qty 1

## 2023-03-12 MED ORDER — THIAMINE HCL 100 MG PO TABS
100.0000 mg | ORAL_TABLET | Freq: Every day | ORAL | Status: DC
  Administered 2023-03-13 – 2023-03-15 (×3): 100 mg via ORAL
  Filled 2023-03-12 (×6): qty 1

## 2023-03-12 NOTE — Progress Notes (Addendum)
Assessed patient this AM. Pt is alert and oriented x3, unsure about his current situation. Answered patient's phone and caller stated she was patient's daughter Scott Cortez. Patient gave verbal consent to give update to his daughter. Provided update including patient's covid positive status. Pt's daughter concerned about where patient could have been exposed to covid. Educated daughter on multiple sources of infection. Daughter stated she would call back to speak with her father. Provided the unit phone number. Meds given to patient one at a time per his preference. Tray setup provided. Call bell in reach. NAD.

## 2023-03-12 NOTE — Discharge Instructions (Signed)
Transportation Resources  Agency Name: [facility] Agency Address: [address] [place], [place] [address] Phone: [phone] Email: [email or web address] Website: [email or web address] Service(s) Offered: Housing services, self-sufficiency, congregate meal program, weatherization program, Field seismologist program, emergency food assistance,  housing counseling, home ownership program, wheels-towork program.  Agency Name: [facility] [employer] [phone]) Address: [address] [place], [place] [address] Phone: [phone] Website: [email or web address] Service(s) Offered: Transportation for [employer], subscription and demand response; Dial-a-Ride for citizens 10 years of age or older.  Agency Name: [facility] [employer] Social Services Address: [address] [place], [place] [address] Phone: [phone] Service(s) Offered: Child support services; child welfare services; food stamps; Medicaid; work first family assistance; and aid with fuel,  rent, food and medicine, transportation assistance.  Agency Name: [employer]abled [employer] (DAV) Transportation  Network Phone: [phone] Service(s) Offered: Transports veterans to the [facility] medical center. Call  forty-eight hours in advance and leave the name, telephone  number, date, and time of appointment. Veteran will be  contacted by the driver the day before the appointment to  arrange a pick up point   Transportation Resources  Agency Name: [facility] Agency Address: [address] [place], [place] [address] Phone: [phone] Email: [email or web address] Website: [email or web address] Service(s) Offered: Housing services, self-sufficiency, congregate meal program, weatherization program, Field seismologist program, emergency food assistance,  housing counseling, home ownership program, wheels-towork  program.  Agency Name: [facility] [employer] [phone]) Address: [address], [name], [place] [address] Phone: [phone] Website: [email or web address] Service(s) Offered: Transportation for [employer], subscription and demand response; Dial-a-Ride for citizens 40 years of age or older.  Agency Name: Department of Social Services Address: [address] [place], [place] [address] Phone: [phone] Service(s) Offered: Child support services; child welfare services; food stamps; Medicaid; work first family assistance; and aid with fuel,  rent, food and medicine, transportation assistance.  Agency Name: [employer]abled [employer] (DAV) Transportation  Network Phone: [phone] Service(s) Offered: Transports veterans to the [facility] medical center. Call  forty-eight hours in advance and leave the name, telephone  number, date, and time of appointment. Veteran will be  contacted by the driver the day before the appointment to  arrange a pick up point    [employer] currently provides door to door services. ACTA connects with [employer] daily for services to [facility]. ACT[employer] also performs contract services to [employer] operates 27 vehicles, all but 3 mini-vans are equipped with lifts for special needs as well as the general public. [employer] drivers are each CDL certified and trained in First Aid and CPR. [employer] was established in 2002 by [employer]. An independent Industrial/product designer. ACT[employer] operates via Cytogeneticist with required Research scientist (physical sciences) from [place]. [employer] provides over 80,000 passenger trips each year, including [facility] and [employer] sites.  Call at least by 11 AM one business day prior to needing transportation  [employer].                      [place], [place] [address]     Office  Hours: Monday-Friday  8 AM - 5 PM

## 2023-03-12 NOTE — Hospital Course (Addendum)
Taken from H&P.  Scott Cortez is a 89 y.o. male with medical history significant for Being admitted for UTI, confusion,frequent falls following a recent discharge from rehab.  Patient has a history of coronary artery disease, AAA, diastolic dysfunction CHF, chronic pancreatitis, essential hypertension, dementia, Parkinson's disease, chronic physical deconditioning, and lived independently until a hospitalization from 9/6 to 01/29/23 for multifactorial weakness,COVID and UTI (<100,000 colonies Klebsiella and Enterococcus)  Patient previously had an APS evaluation prior to the hospitalization but was discharged home from the rehab facility.  Following his return home he went on to have multiple falls, most recently on the day of arrival when he was unable to get himself up off the ground.  He is also reportedly confused and has been incontinent of urine.   On presenting danshen to ED, vitals normal, COVID remain positive since 01/18/2023.  UA positive for nitrite and many bacteria.  EKG with A-fib, rate at 82 and incomplete RBBB. CT head negative for any acute abnormality but consistent with NPH. CT cervical spine was negative for any acute injury. Patient was started on ceftriaxone for UTI.  10/29: Vitals stable, pending urine culture.  CT value of COVID test was 35.9 which is consistent with lingering positive and no reinfection so precautions were discontinued.  10/30: Vital stable, urine cultures with gram-negative rods PT is recommending SNF Multiple nutritional labs were sent as patient meet criteria for severe malnutrition. Patient will continue on boost or Ensure to supplement diet.  10/31: Remained hemodynamically stable.  Awaiting SNF placement.  11/1: Remained hemodynamically stable and is being discharged to rehab for further management.  Patient will continue on current medications and need to have a close follow-up with his providers for further care.

## 2023-03-12 NOTE — Progress Notes (Signed)
Initial Nutrition Assessment  DOCUMENTATION CODES:   Severe malnutrition in context of chronic illness  INTERVENTION:   Ensure Enlive po TID, each supplement provides 350 kcal and 20 grams of protein.  Magic cup TID with meals, each supplement provides 290 kcal and 9 grams of protein  MVI po daily   Thiamine 100mg  po daily   Pt at high refeed risk; recommend monitor potassium, magnesium and phosphorus labs daily until stable  Daily weights   Check vitamins D, A, B12, folate, ferritin and zinc labs  NUTRITION DIAGNOSIS:   Severe Malnutrition related to chronic illness as evidenced by severe fat depletion, severe muscle depletion.  GOAL:   Patient will meet greater than or equal to 90% of their needs  MONITOR:   PO intake, Supplement acceptance, Weight trends, I & O's, Skin  REASON FOR ASSESSMENT:   Consult Assessment of nutrition requirement/status  ASSESSMENT:   89 y/o male with h/o dementia, parkinsons, BPH, CAD, Afib, HTN, CHF, NPH, COVID 19 (September), GERD, HLD, AAA, depression and chronic pancreatitis who is admitted with UTI, weakness and falls.  Met with pt in room today. Pt reports that he is not feeling well. Pt sitting up eating lunch at time of RD visit; pt ate 100% of his meal except for the meat. Pt reports that he is a vegetarian. Pt reports that he has not eaten red meat or chicken since he was a child. Pt reports that he does not like the texture of meat and also he supports animals rights. Pt reports that he does eat dairy; pt also reports drinking Ensure at home. Pt reports that he does not really like eggs but he will occasionally eat them. Pt reports that he loves cereal with milk and applesauce for breakfast in the morning. Pt eats vegetables and fruits the rest of the day. Pt reports weakness and falls pta; pt relates this to his parkinsons and neuropathy. RD discussed with pt the importance of adequate nutrition needed to preserve lean muscle. Pt is  agreeable to supplements in hospital. Pt is at high refeed risk. RD will check vitamins labs where deficiency can lead to weakness and falls as pt is at high risk for deficiency.   Per chart, pt appears to be down 7lbs(5%) since February; RD unsure how recently weight loss occurred. Pt is noted to have had COVID 19 last month. Pt reports that he has lost ~ 50lbs over the past several years.   Medications reviewed and include: ceftriaxone   Labs reviewed: K 3.7 wnl  NUTRITION - FOCUSED PHYSICAL EXAM:  Flowsheet Row Most Recent Value  Orbital Region Moderate depletion  Upper Arm Region Severe depletion  Thoracic and Lumbar Region Severe depletion  Buccal Region Moderate depletion  Temple Region Severe depletion  Clavicle Bone Region Severe depletion  Clavicle and Acromion Bone Region Severe depletion  Scapular Bone Region Severe depletion  Dorsal Hand Severe depletion  Patellar Region Severe depletion  Anterior Thigh Region Severe depletion  Posterior Calf Region Severe depletion  Edema (RD Assessment) None  Hair Reviewed  Eyes Reviewed  Mouth Reviewed  Skin Reviewed  Nails Reviewed   Diet Order:   Diet Order             Diet vegetarian Room service appropriate? Yes; Fluid consistency: Thin  Diet effective now                  EDUCATION NEEDS:   Education needs have been addressed  Skin:  Skin  Assessment: Reviewed RN Assessment (ecchymosis)  Last BM:  10/26  Height:   Ht Readings from Last 1 Encounters:  03/11/23 5\' 10"  (1.778 m)    Weight:   Wt Readings from Last 1 Encounters:  03/12/23 65.3 kg    Ideal Body Weight:  75.4 kg  BMI:  Body mass index is 20.66 kg/m.  Estimated Nutritional Needs:   Kcal:  1800-2100kcal/day  Protein:  90-105g/day  Fluid:  1.7-2.0L/day  Betsey Holiday MS, RD, LDN Please refer to [facility] for RD and/or RD on-call/weekend/after hours pager

## 2023-03-12 NOTE — Evaluation (Signed)
Physical Therapy Evaluation Patient Details Name: Scott Cortez MRN: [id number] DOB: 14-Jul-1933 Today's Date: 03/12/2023  History of Present Illness  Patient is a 89 year old male admitted from home with significant weakness, falls. History of dementia, Parkinson's. COVID +   Clinical Impression  Pt received in bed and agreed to PT session. Pt reports continued right hip discomfort from previous fall. Pt performs supine>sit SUP with the use of bed rails and HOB elevated, STS X2 at EOB MinA with the use of RW and bed rail, bed mobility to end session back in bed sit>supine MinA for BLE management. VC necessary to ensure safety with RW management and to encourage upright posture however, pt does not appreciate talking as they are doing something at the same time as it causes for a distraction factor and slows down their performance time. All activities were performed with increased time due to pt wanting to perform activity when they felt comfortable to do so. Pt tolerated Tx fair today and will continue to benefit from skilled PT sessions to improve strength, balance, activity tolerance, and functional mobility.       If plan is discharge home, recommend the following: A lot of help with walking and/or transfers;A little help with bathing/dressing/bathroom;Assist for transportation;Help with stairs or ramp for entrance   Can travel by private vehicle   Yes    Equipment Recommendations Rolling walker (2 wheels)  Recommendations for Other Services       Functional Status Assessment Patient has had a recent decline in their functional status and demonstrates the ability to make significant improvements in function in a reasonable and predictable amount of time.     Precautions / Restrictions Precautions Precautions: Fall Restrictions Weight Bearing Restrictions: No      Mobility  Bed Mobility Overal bed mobility: Needs Assistance Bed Mobility: Supine to Sit, Sit to Supine      Supine to sit: Supervision, HOB elevated, Used rails Sit to supine: Min assist, Used rails   General bed mobility comments: Pt required MinA to return to bed for BLE management. Pt additionally used bed rails to scoot themselves into a better position.    Transfers Overall transfer level: Needs assistance Equipment used: Rolling walker (2 wheels) Transfers: Sit to/from Stand Sit to Stand: Min assist           General transfer comment: Pt requires MinA to perform STS at EOB. Pt used the assistance of Rw (2wheels) and bedrails to perform STS.    Ambulation/Gait               General Gait Details: Amb not performed. Gait deferred.  Stairs            Wheelchair Mobility     Tilt Bed    Modified Rankin (Stroke Patients Only)       Balance Overall balance assessment: Needs assistance, History of Falls Sitting-balance support: Feet supported, Bilateral upper extremity supported Sitting balance-Leahy Scale: Good     Standing balance support: Bilateral upper extremity supported, During functional activity, Reliant on assistive device for balance Standing balance-Leahy Scale: Fair Standing balance comment: Time is necessary to allow pt to stand in an upright posture.                             Pertinent Vitals/Pain Pain Assessment Pain Assessment: Faces Faces Pain Scale: Hurts a little bit Pain Location: Right hip Pain Descriptors / Indicators: Aching, Constant Pain  Intervention(s): Monitored during session    Home Living Family/patient expects to be discharged to:: Private residence Living Arrangements: Alone Available Help at Discharge: Family;Friend(s);Available PRN/intermittently Type of Home: House Home Access: Stairs to enter Entrance Stairs-Rails: Right Entrance Stairs-Number of Steps: 5   Home Layout: One level Home Equipment: Agricultural consultant (2 wheels);Cane - single point;Other (comment) (Pt also owns a walking stick)       Prior Function Prior Level of Function : Needs assist             Mobility Comments: Pt reports that they do not amb long distance. When at home, 80% of the time,pt will use their RW in the house or they will use surrounding furniture to get around. ADLs Comments: Pt reports that friends and family will stop by to bring meals and bring groceries.     Extremity/Trunk Assessment   Upper Extremity Assessment Upper Extremity Assessment: Overall WFL for tasks assessed    Lower Extremity Assessment Lower Extremity Assessment: Generalized weakness       Communication   Communication Communication: Difficulty following commands/understanding Following commands: Follows one step commands inconsistently Cueing Techniques: Verbal cues;Tactile cues  Cognition Arousal: Alert Behavior During Therapy: WFL for tasks assessed/performed Overall Cognitive Status: History of cognitive impairments - at baseline                                 General Comments: Pt willing to participate in PT session. Pt presented with agitation if things are not done how they want them to be done.        General Comments      Exercises     Assessment/Plan    PT Assessment Patient needs continued PT services  PT Problem List Decreased activity tolerance;Decreased balance;Decreased mobility;Pain       PT Treatment Interventions DME instruction;Functional mobility training;Therapeutic activities    PT Goals (Current goals can be found in the Care Plan section)  Acute Rehab PT Goals Patient Stated Goal: Wants to be able to do things ind. PT Goal Formulation: With patient Time For Goal Achievement: 03/26/23 Potential to Achieve Goals: Good    Frequency Min 1X/week     Co-evaluation               AM-PAC PT "6 Clicks" Mobility  Outcome Measure Help needed turning from your back to your side while in a flat bed without using bedrails?: A Lot Help needed moving from lying on  your back to sitting on the side of a flat bed without using bedrails?: A Lot Help needed moving to and from a bed to a chair (including a wheelchair)?: A Little Help needed standing up from a chair using your arms (e.g., wheelchair or bedside chair)?: A Little Help needed to walk in hospital room?: A Little Help needed climbing 3-5 steps with a railing? : A Lot 6 Click Score: 15    End of Session Equipment Utilized During Treatment: Gait belt Activity Tolerance: Patient tolerated treatment well Patient left: in bed;with call bell/phone within reach;with bed alarm set Nurse Communication: Mobility status PT Visit Diagnosis: Unsteadiness on feet (R26.81);Other abnormalities of gait and mobility (R26.89);Repeated falls (R29.6);Muscle weakness (generalized) (M62.81);History of falling (Z91.81);Difficulty in walking, not elsewhere classified (R26.2);Pain Pain - Right/Left: Right Pain - part of body: Hip    Time: 1045-1130 PT Time Calculation (min) (ACUTE ONLY): 45 min   Charges:   PT Evaluation $PT Eval  Low Complexity: 1 Low PT Treatments $Therapeutic Activity: 23-37 mins PT General Charges $$ ACUTE PT VISIT: 1 Visit         Toshiyuki Fredell Sauvignon Howard SPT, LAT, ATC  Evyn Kooyman Sauvignon-Howard 03/12/2023, 3:51 PM

## 2023-03-12 NOTE — Progress Notes (Signed)
Progress Note   Patient: Scott Cortez ZOX:[id number] DOB: Feb 21, 1934 DOA: 03/11/2023     1 DOS: the patient was seen and examined on 03/12/2023   Brief hospital course: Taken from H&P.  Shavar Jaworowski is a 89 y.o. male with medical history significant for Being admitted for UTI, confusion,frequent falls following a recent discharge from rehab.  Patient has a history of coronary artery disease, AAA, diastolic dysfunction CHF, chronic pancreatitis, essential hypertension, dementia, Parkinson's disease, chronic physical deconditioning, and lived independently until a hospitalization from 9/6 to 01/29/23 for multifactorial weakness,COVID and UTI (<100,000 colonies Klebsiella and Enterococcus)  Patient previously had an APS evaluation prior to the hospitalization but was discharged home from the rehab facility.  Following his return home he went on to have multiple falls, most recently on the day of arrival when he was unable to get himself up off the ground.  He is also reportedly confused and has been incontinent of urine.   On presenting danshen to ED, vitals normal, COVID remain positive since 01/18/2023.  UA positive for nitrite and many bacteria.  EKG with A-fib, rate at 82 and incomplete RBBB. CT head negative for any acute abnormality but consistent with NPH. CT cervical spine was negative for any acute injury. Patient was started on ceftriaxone for UTI.  10/29: Vitals stable, pending urine culture.  CT value of COVID test was 35.9 which is consistent with lingering positive and no reinfection so precautions were discontinued.    Assessment and Plan: * Frequent falls NPH on CT Parkinson disease/dementia Frailty and advanced age/physical deconditioning Unable to care for self PT and TOC consult Consider neurosurg consult for NPH seen on CT RPR negative, thyroid panel and B1 pending.   Acute metabolic encephalopathy Patient with acute confusion beyond baseline,  mentation seems improving Possibly related to new UTI, recent COVID, underlying dementia NPH Delirium precautions, fall precautions Treat UTI  Urinary tract infection Recent UTI 01/18/2023 (<100,000 colonies Klebsiella and Enterococcus)  Continue Rocephin Follow cultures  Parkinson disease (HCC) Continue Sinemet and ropinirole  Hypertension Continue carvedilol  Chronic diastolic (congestive) heart failure (HCC) Clinically euvolemic Continue carvedilol  CAD (coronary artery disease) Continue carvedilol atorvastatin and apixaban  Atrial fibrillation (HCC) Continue carvedilol and apixaban  BPH with obstruction/lower urinary tract symptoms Continue tamsulosin   Subjective: Patient continued to feel weak, denies any pain.  No upper respiratory symptoms.  Physical Exam: Vitals:   03/11/23 2249 03/11/23 2316 03/11/23 2336 03/12/23 0918  BP: 113/77 112/80  115/83  Pulse: 62 76  (!) 52  Resp: 18 17  20   Temp: 97.7 F (36.5 C) 98.2 F (36.8 C)  97.9 F (36.6 C)  TempSrc: Oral   Oral  SpO2: 97% 98%  98%  Height:   5\' 10"  (1.778 m)    General.  Frail and malnourished elderly man, in no acute distress. Pulmonary.  Lungs clear bilaterally, normal respiratory effort. CV.  Regular rate and rhythm, no JVD, rub or murmur. Abdomen.  Soft, nontender, nondistended, BS positive. CNS.  Alert and oriented .  No focal neurologic deficit. Extremities.  No edema, no cyanosis, pulses intact and symmetrical.   Data Reviewed: Prior data reviewed  Family Communication: Talked with daughter on phone.  Disposition: Status is: Inpatient Remains inpatient appropriate because:   Planned Discharge Destination:  To be determined  DVT prophylaxis.  Eliquis Time spent: 50 minutes  This record has been created using Conservation officer, historic buildings. Errors have been sought and corrected,but may not always  be located. Such creation errors do not reflect on the standard of care.    Author: Arnetha Courser, MD 03/12/2023 1:30 PM  For on call review [email or web address].

## 2023-03-13 DIAGNOSIS — R296 Repeated falls: Secondary | ICD-10-CM | POA: Diagnosis not present

## 2023-03-13 DIAGNOSIS — E43 Unspecified severe protein-calorie malnutrition: Secondary | ICD-10-CM | POA: Insufficient documentation

## 2023-03-13 DIAGNOSIS — I10 Essential (primary) hypertension: Secondary | ICD-10-CM

## 2023-03-13 DIAGNOSIS — G912 (Idiopathic) normal pressure hydrocephalus: Secondary | ICD-10-CM | POA: Diagnosis not present

## 2023-03-13 DIAGNOSIS — G9341 Metabolic encephalopathy: Secondary | ICD-10-CM | POA: Diagnosis not present

## 2023-03-13 LAB — FOLATE: Folate: 16.7 ng/mL (ref >5.9)

## 2023-03-13 LAB — THYROID PANEL WITH TSH
Free Thyroxine Index: 2.1 (ref 1.2–4.9)
T3 Uptake Ratio: 33 % (ref 24–39)
T4, Total: 6.4 ug/dL (ref 4.5–12.0)
TSH: 1.51 u[IU]/mL (ref 0.450–4.500)

## 2023-03-13 LAB — FERRITIN: Ferritin: 183 ng/mL (ref 24–336)

## 2023-03-13 LAB — MAGNESIUM: Magnesium: 2 mg/dL (ref 1.7–2.4)

## 2023-03-13 LAB — PHOSPHORUS: Phosphorus: 3 mg/dL (ref 2.5–4.6)

## 2023-03-13 LAB — VITAMIN D 25 HYDROXY (VIT D DEFICIENCY, FRACTURES): Vit D, 25-Hydroxy: 46.59 ng/mL (ref 30–100)

## 2023-03-13 LAB — VITAMIN B12: Vitamin B-12: 424 pg/mL (ref 180–914)

## 2023-03-13 NOTE — Assessment & Plan Note (Signed)
Recent UTI 01/18/2023 (<100,000 colonies Klebsiella and Enterococcus)  Repeat cultures with gram-negative rod Continue Rocephin Follow cultures

## 2023-03-13 NOTE — Progress Notes (Signed)
Progress Note   Patient: Scott Cortez ZOX:[id number] DOB: 01-13-1934 DOA: 03/11/2023     2 DOS: the patient was seen and examined on 03/13/2023   Brief hospital course: Taken from H&P.  Scott Cortez is a 89 y.o. male with medical history significant for Being admitted for UTI, confusion,frequent falls following a recent discharge from rehab.  Patient has a history of coronary artery disease, AAA, diastolic dysfunction CHF, chronic pancreatitis, essential hypertension, dementia, Parkinson's disease, chronic physical deconditioning, and lived independently until a hospitalization from 9/6 to 01/29/23 for multifactorial weakness,COVID and UTI (<100,000 colonies Klebsiella and Enterococcus)  Patient previously had an APS evaluation prior to the hospitalization but was discharged home from the rehab facility.  Following his return home he went on to have multiple falls, most recently on the day of arrival when he was unable to get himself up off the ground.  He is also reportedly confused and has been incontinent of urine.   On presenting danshen to ED, vitals normal, COVID remain positive since 01/18/2023.  UA positive for nitrite and many bacteria.  EKG with A-fib, rate at 82 and incomplete RBBB. CT head negative for any acute abnormality but consistent with NPH. CT cervical spine was negative for any acute injury. Patient was started on ceftriaxone for UTI.  10/29: Vitals stable, pending urine culture.  CT value of COVID test was 35.9 which is consistent with lingering positive and no reinfection so precautions were discontinued.  10/30: Vital stable, urine cultures with gram-negative rods PT is recommending SNF Multiple nutritional labs were sent as patient meet criteria for severe malnutrition.   Assessment and Plan: * Frequent falls NPH on CT Parkinson disease/dementia Frailty and advanced age/physical deconditioning Unable to care for self PT and TOC consult,  recommending SNF  Consider neurosurg outpatient evaluation for NPH seen on CT RPR negative, thyroid panel normal and B1 pending.   Acute metabolic encephalopathy Patient with acute confusion beyond baseline, mentation seems improved to baseline Possibly related to new UTI, recent COVID, underlying dementia NPH Delirium precautions, fall precautions Treat UTI  Urinary tract infection Recent UTI 01/18/2023 (<100,000 colonies Klebsiella and Enterococcus)  Repeat cultures with gram-negative rod Continue Rocephin Follow cultures  Parkinson disease (HCC) Continue Sinemet and ropinirole  Hypertension Continue carvedilol  Chronic diastolic (congestive) heart failure (HCC) Clinically euvolemic Continue carvedilol  CAD (coronary artery disease) Continue carvedilol atorvastatin and apixaban  Atrial fibrillation (HCC) Continue carvedilol and apixaban  BPH with obstruction/lower urinary tract symptoms Continue tamsulosin  Protein-calorie malnutrition, severe Multiple nutritional labs were sent by dietitian-pending -Continue with supplement   Subjective: Patient with no new concern.  Physical Exam: Vitals:   03/12/23 2018 03/13/23 0328 03/13/23 0329 03/13/23 0919  BP: 101/69  111/78 (!) 133/90  Pulse: 79  60 87  Resp: 18  18 20   Temp: 97.9 F (36.6 C)  98 F (36.7 C) 97.6 F (36.4 C)  TempSrc:      SpO2: 99%  98% 99%  Weight:  66.4 kg    Height:       General.  Severely malnourished elderly man, in no acute distress. Pulmonary.  Lungs clear bilaterally, normal respiratory effort. CV.  Regular rate and rhythm, no JVD, rub or murmur. Abdomen.  Soft, nontender, nondistended, BS positive. CNS.  Alert and oriented .  No focal neurologic deficit. Extremities.  No edema, no cyanosis, pulses intact and symmetrical.  Data Reviewed: Prior data reviewed  Family Communication:   Disposition: Status is: Inpatient Remains inpatient  appropriate because:   Planned Discharge  Destination:  To be determined  DVT prophylaxis.  Eliquis Time spent: 44 minutes  This record has been created using Conservation officer, historic buildings. Errors have been sought and corrected,but may not always be located. Such creation errors do not reflect on the standard of care.   Author: Arnetha Courser, MD 03/13/2023 4:02 PM  For on call review [email or web address].

## 2023-03-13 NOTE — Evaluation (Signed)
Occupational Therapy Evaluation Patient Details Name: Scott Cortez MRN: [id number] DOB: 1933-08-29 Today's Date: 03/13/2023   History of Present Illness Patient is a 89 year old male admitted from home with significant weakness, falls. History of dementia, Parkinson's. COVID +   Clinical Impression   Pt was seen for OT evaluation this date. Prior to hospital admission, pt was living alone with intermittent check ins from family and significant other. He had recently been in rehab and returned home, but has had falls at home. Mostly IND with ADLs, but needed assist with IADLs. Used a RW for mobility most of the time or furniture surfed.   Pt presents to acute OT demonstrating impaired ADL performance and functional mobility 2/2 weakness, pain, limited activity tolerance and balance deficits (See OT problem list for additional functional deficits). Pt currently requires CGA for STS from recliner with cueing for hand placement on recliner versus pulling on RW. Upon standing he needed increased time and cueing to reach upright posture. CGA for seated LB dressing to doff/donn socks. He requires Min A and significantly increased time to take a few small shuffling steps/slides in the room and transfer from recliner to bed. Returned to supine with SUP and scooted to [facility] using bed rails with SUP and verb cues for hand placement. He would need increased assist from lower surfaces and with higher level ADLs.  Pt would benefit from skilled OT services to address noted impairments and functional limitations (see below for any additional details) in order to maximize safety and independence while minimizing falls risk and caregiver burden. Do anticipate the need for follow up OT services upon acute hospital DC, recommend SNF on DC.       If plan is discharge home, recommend the following: A little help with bathing/dressing/bathroom;Assistance with cooking/housework;Help with stairs or ramp for  entrance;Assist for transportation;A lot of help with walking and/or transfers    Functional Status Assessment  Patient has had a recent decline in their functional status and demonstrates the ability to make significant improvements in function in a reasonable and predictable amount of time.  Equipment Recommendations  Other (comment) (defer)    Recommendations for Other Services       Precautions / Restrictions Precautions Precautions: Fall Restrictions Weight Bearing Restrictions: No      Mobility Bed Mobility Overal bed mobility: Needs Assistance Bed Mobility: Sit to Supine       Sit to supine: Used rails, Supervision   General bed mobility comments: SBA to return to supine in bed  today with cueing for handrail use to scoot to [facility]    Transfers Overall transfer level: Needs assistance Equipment used: Rolling walker (2 wheels) Transfers: Sit to/from Stand Sit to Stand: Contact guard assist           General transfer comment: CGA for STS from recliner with increased time, likely to need assist from lower surface      Balance Overall balance assessment: Needs assistance, History of Falls Sitting-balance support: Feet supported, Bilateral upper extremity supported Sitting balance-Leahy Scale: Good Sitting balance - Comments: no balance deficits donning socks in recliner   Standing balance support: Bilateral upper extremity supported, During functional activity, Reliant on assistive device for balance Standing balance-Leahy Scale: Fair Standing balance comment: needs cueing to stand upright at walker and increased time for all movement                           ADL  either performed or assessed with clinical judgement   ADL Overall ADL's : Needs assistance/impaired                     Lower Body Dressing: Contact guard assist;Sitting/lateral leans Lower Body Dressing Details (indicate cue type and reason): to doff/don socks seated in  recliner Toilet Transfer: Ambulation;Rolling walker (2 wheels);Minimal assistance Toilet Transfer Details (indicate cue type and reason): simulated from recliner to bed; increased time (a lot) and not able to lift feet, more shuffling/sliding                 Vision         Perception         Praxis         Pertinent Vitals/Pain Pain Assessment Pain Assessment: 0-10 Pain Score: 3  Pain Location: Right hip Pain Descriptors / Indicators: Sore Pain Intervention(s): Monitored during session     Extremity/Trunk Assessment Upper Extremity Assessment Upper Extremity Assessment: Overall WFL for tasks assessed   Lower Extremity Assessment Lower Extremity Assessment: Generalized weakness       Communication Communication Communication: Difficulty following commands/understanding   Cognition Arousal: Alert Behavior During Therapy: WFL for tasks assessed/performed Overall Cognitive Status: History of cognitive impairments - at baseline                                 General Comments: reporst he cannot focus on multiple tasks at once and prefers no talking while he is trying to move or walk     General Comments       Exercises     Shoulder Instructions      Home Living Family/patient expects to be discharged to:: Private residence Living Arrangements: Alone Available Help at Discharge: Family;Friend(s);Available PRN/intermittently Type of Home: House Home Access: Stairs to enter Entrance Stairs-Number of Steps: 5 Entrance Stairs-Rails: Right Home Layout: One level     Bathroom Shower/Tub: Producer, television/film/video: Handicapped height     Home Equipment: Agricultural consultant (2 wheels);Cane - single point;Other (comment) (walking stick)          Prior Functioning/Environment Prior Level of Function : Needs assist             Mobility Comments: Pt reports that they do not amb long distance. When at home, 80% of the time,pt will  use their RW in the house or they will use surrounding furniture to get around. ADLs Comments: Pt reports that friends and family will stop by to bring meals and bring groceries, but he was IND with ADLs and could make simple breakfast meals.        OT Problem List: Decreased strength;Decreased activity tolerance;Impaired balance (sitting and/or standing);Decreased safety awareness      OT Treatment/Interventions: Self-care/ADL training;Therapeutic exercise;Patient/family education;Balance training;Therapeutic activities;DME and/or AE instruction    OT Goals(Current goals can be found in the care plan section) Acute Rehab OT Goals Patient Stated Goal: improve strength OT Goal Formulation: With patient Time For Goal Achievement: 03/27/23 Potential to Achieve Goals: Fair ADL Goals Pt Will Perform Lower Body Bathing: with min assist;sitting/lateral leans;sit to/from stand Pt Will Perform Lower Body Dressing: with min assist;sit to/from stand;sitting/lateral leans Pt Will Transfer to Toilet: with contact guard assist;bedside commode;ambulating Pt Will Perform Toileting - Clothing Manipulation and hygiene: with contact guard assist;sitting/lateral leans;sit to/from stand Additional ADL Goal #1: Pt will perform all bed mobility with MOD  I and good safety to return to PLOF.  OT Frequency: Min 1X/week    Co-evaluation              AM-PAC OT "6 Clicks" Daily Activity     Outcome Measure Help from another person eating meals?: None Help from another person taking care of personal grooming?: None Help from another person toileting, which includes using toliet, bedpan, or urinal?: A Little Help from another person bathing (including washing, rinsing, drying)?: A Little Help from another person to put on and taking off regular upper body clothing?: A Little Help from another person to put on and taking off regular lower body clothing?: A Little 6 Click Score: 20   End of Session Equipment  Utilized During Treatment: Rolling walker (2 wheels);Gait belt Nurse Communication: Mobility status  Activity Tolerance: Patient tolerated treatment well Patient left: in bed;with call bell/phone within reach;with bed alarm set;with family/visitor present  OT Visit Diagnosis: Unsteadiness on feet (R26.81);Other abnormalities of gait and mobility (R26.89);History of falling (Z91.81);Muscle weakness (generalized) (M62.81)                Time: 4132-4401 OT Time Calculation (min): 51 min Charges:  OT General Charges $OT Visit: 1 Visit OT Evaluation $OT Eval Moderate Complexity: 1 Mod OT Treatments $Self Care/Home Management : 8-22 mins $Therapeutic Activity: 8-22 mins Kavonte Bearse, OTR/L 03/13/23, 4:23 PM Lonni Dirden E Tarra Pence 03/13/2023, 4:19 PM

## 2023-03-13 NOTE — Progress Notes (Signed)
Mobility Specialist - Progress Note   03/13/23 1100  Mobility  Activity Ambulated with assistance in room;Transferred from bed to chair  Level of Assistance Minimal assist, patient does 75% or more  Assistive Device Front wheel walker  Distance Ambulated (ft) 6 ft  Activity Response Tolerated well  $Mobility charge 1 Mobility     Pt lying in bed upon arrival, utilizing RA. Pt pleasant and agreeable to activity. Completed bed mobility with supervision. STS and ambulation in room with minA. Post lean in initial stance + extra time needed for balance. No LOB. Slow, cautious gait. Endorses fear of falling. Pt left in chair with alarm set, needs in reach.    Filiberto Pinks Mobility Specialist 03/13/23, 11:12 AM

## 2023-03-13 NOTE — Plan of Care (Signed)
  Problem: Coping: Goal: Level of anxiety will decrease Outcome: Progressing   Problem: Elimination: Goal: Will not experience complications related to urinary retention Outcome: Progressing   Problem: Pain Management: Goal: General experience of comfort will improve Outcome: Progressing   Problem: Safety: Goal: Ability to remain free from injury will improve Outcome: Progressing

## 2023-03-13 NOTE — Progress Notes (Signed)
Evening dose of Coreg held per Dr. Nelson Chimes, BP 92/69, HR 51.  Patient asymptomatic at this time.  Continue to monitor.

## 2023-03-13 NOTE — Assessment & Plan Note (Signed)
Multiple nutritional labs were sent by dietitian-pending -Continue with supplement

## 2023-03-13 NOTE — Assessment & Plan Note (Signed)
Patient with acute confusion beyond baseline, mentation seems improved to baseline Possibly related to new UTI, recent COVID, underlying dementia NPH Delirium precautions, fall precautions Treat UTI

## 2023-03-13 NOTE — Assessment & Plan Note (Signed)
Continue carvedilol 

## 2023-03-13 NOTE — [place] FL2 (Signed)
[place] MEDICAID FL2 LEVEL OF CARE FORM     IDENTIFICATION  Patient Name: Scott Cortez Birthdate: 1934-01-17 Sex: male Admission Date (Current Location): 03/11/2023  [place] and [place] Number:  Chiropodist and Address:  [facility], [address] [place], [place] [address]      Provider Number: [id number]  Attending Physician Name and Address:  Arnetha Courser, MD  Relative Name and Phone Number:       Current Level of Care: Hospital Recommended Level of Care: Skilled Nursing Facility Prior Approval Number:    Date Approved/Denied:   PASRR Number: [id number]  Discharge Plan: SNF    Current Diagnoses: Patient Active Problem List   Diagnosis Date Noted   Protein-calorie malnutrition, severe 03/13/2023   Frequent falls 03/11/2023   Urinary tract infection 03/11/2023   Acute metabolic encephalopathy 03/11/2023   NPH (normal pressure hydrocephalus) (HCC) 03/11/2023   Frailty syndrome in geriatric patient 03/11/2023   General weakness 01/18/2023   COVID-19 virus infection 01/18/2023   Hypokalemia 01/18/2023   Dizziness 03/27/2022   Parkinson disease (HCC) 03/27/2022   Fall 03/26/2022   Physical deconditioning 03/26/2022   CAD (coronary artery disease) 03/25/2022   Hypertension 03/25/2022   Falls 03/25/2022   Thrombocytopenia (HCC) 03/25/2022   Atrial fibrillation (HCC) 03/25/2022   Chronic diastolic (congestive) heart failure (HCC) 03/25/2022   Right groin pain 05/17/2021   Near syncope 03/07/2019   Nocturia 06/26/2016   Lower abdominal pain 09/04/2015   Erectile dysfunction 08/30/2014   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   Incomplete emptying of bladder 01/26/2014   Increased frequency of urination 01/26/2014   Slowing of urinary stream 01/26/2014    Orientation RESPIRATION BLADDER Height & Weight     Self, Time, Situation, Place  Normal Incontinent Weight: 146 lb 6.2 oz (66.4  kg) Height:  5\' 10"  (177.8 cm)  BEHAVIORAL SYMPTOMS/MOOD NEUROLOGICAL BOWEL NUTRITION STATUS   (None)  (Parkinsons) Continent Diet (Vegetarian, Heart healthy)  AMBULATORY STATUS COMMUNICATION OF NEEDS Skin   Extensive Assist Verbally Bruising, Other (Comment) (Erythema/redness.)                       Personal Care Assistance Level of Assistance  Bathing, Feeding, Dressing Bathing Assistance: Limited assistance Feeding assistance: Limited assistance Dressing Assistance: Limited assistance     Functional Limitations Info  Sight, Hearing, Speech Sight Info: Adequate Hearing Info: Adequate Speech Info: Adequate    SPECIAL CARE FACTORS FREQUENCY  PT (By licensed PT)     PT Frequency: 5 x week              Contractures Contractures Info: Not present    Additional Factors Info  Code Status, Allergies Code Status Info: DNR Allergies Info: Pneumococcal Vaccines           Current Medications (03/13/2023):  This is the current hospital active medication list Current Facility-Administered Medications  Medication Dose Route Frequency Provider Last Rate Last Admin   acetaminophen (TYLENOL) tablet 650 mg  650 mg Oral Q6H PRN Andris Baumann, MD       Or   acetaminophen (TYLENOL) suppository 650 mg  650 mg Rectal Q6H PRN Andris Baumann, MD       apixaban Everlene Balls) tablet 5 mg  5 mg Oral BID Andris Baumann, MD   5 mg at 03/13/23 5621   atorvastatin (LIPITOR) tablet 20 mg  20 mg Oral Daily Andris Baumann, MD   20 mg  at 03/13/23 0951   carbidopa-levodopa (SINEMET CR) 50-200 MG per tablet controlled release 1 tablet  1 tablet Oral QHS Andris Baumann, MD   1 tablet at 03/12/23 2137   carbidopa-levodopa (SINEMET IR) 25-100 MG per tablet immediate release 2 tablet  2 tablet Oral QID Andris Baumann, MD   2 tablet at 03/13/23 0951   carvedilol (COREG) tablet 6.25 mg  6.25 mg Oral BID WC Lindajo Royal V, MD   6.25 mg at 03/13/23 4403   cefTRIAXone (ROCEPHIN) 1 g in sodium  chloride 0.9 % 100 mL IVPB  1 g Intravenous Q24H Lindajo Royal V, MD   Stopped at 03/13/23 0700   feeding supplement (ENSURE ENLIVE / ENSURE PLUS) liquid 237 mL  237 mL Oral TID BM Andris Baumann, MD   237 mL at 03/13/23 4742   multivitamin with minerals tablet 1 tablet  1 tablet Oral Daily Arnetha Courser, MD   1 tablet at 03/13/23 0951   ondansetron (ZOFRAN) tablet 4 mg  4 mg Oral Q6H PRN Andris Baumann, MD       Or   ondansetron [facility]) injection 4 mg  4 mg Intravenous Q6H PRN Andris Baumann, MD       Oral care mouth rinse  15 mL Mouth Rinse PRN Andris Baumann, MD       rOPINIRole (REQUIP) tablet 0.25 mg  0.25 mg Oral QHS Lindajo Royal V, MD   0.25 mg at 03/12/23 2137   tamsulosin (FLOMAX) capsule 0.4 mg  0.4 mg Oral Q supper Andris Baumann, MD   0.4 mg at 03/12/23 1727   thiamine (VITAMIN B1) tablet 100 mg  100 mg Oral Daily Arnetha Courser, MD   100 mg at 03/13/23 5956     Discharge Medications: Please see discharge summary for a list of discharge medications.  Relevant Imaging Results:  Relevant Lab Results:   Additional Information SS#: [id number]. He was at [facility] 9/17-10/19. Has [employer] secondary insurance.  Margarito Liner, LCSW

## 2023-03-13 NOTE — Plan of Care (Signed)
  Problem: Activity: Goal: Risk for activity intolerance will decrease Outcome: Progressing   Problem: Safety: Goal: Ability to remain free from injury will improve Outcome: Progressing   

## 2023-03-13 NOTE — TOC Initial Note (Signed)
Transition of Care [facility]) - Initial/Assessment Note    Patient Details  Name: Scott Cortez MRN: [id number] Date of Birth: April 07, 1934  [facility] CM/SW Contact:    Margarito Liner, LCSW Phone Number: 03/13/2023, 10:46 AM  Clinical Narrative: CSW met with patient. No supports at bedside. CSW introduced role and explained that PT recommendations would be discussed. Patient is agreeable to SNF placement. He was at [facility] 9/17-10/19 but does have secondary insurance to cover copays. Patient was set up with [facility] when he was discharged from SNF but they have not started services yet. Patient will check with his significant other to see which SNF is close to her home. No further concerns. CSW encouraged patient to contact CSW as needed. CSW will continue to follow patient for support and facilitate discharge to SNF once medically stable.                 Expected Discharge Plan: Skilled Nursing Facility Barriers to Discharge: SNF Pending bed offer   Patient Goals and CMS Choice            Expected Discharge Plan and Services     Post Acute Care Choice: Skilled Nursing Facility Living arrangements for the past 2 months: Single Family Home                                      Prior Living Arrangements/Services Living arrangements for the past 2 months: Single Family Home Lives with:: Self Patient language and need for interpreter reviewed:: Yes Do you feel safe going back to the place where you live?: Yes      Need for Family Participation in Patient Care: Yes (Comment)     Criminal Activity/Legal Involvement Pertinent to Current Situation/Hospitalization: No - Comment as needed  Activities of Daily Living   ADL Screening (condition at time of admission) Independently performs ADLs?: Yes (appropriate for developmental age) Is the patient deaf or have difficulty hearing?: No Does the patient have difficulty seeing, even when  wearing glasses/contacts?: No Does the patient have difficulty concentrating, remembering, or making decisions?: No  Permission Sought/Granted Permission sought to share information with : Facility Industrial/product designer granted to share information with : Yes, Verbal Permission Granted     Permission granted to share info w AGENCY: SNF's        Emotional Assessment Appearance:: Appears stated age Attitude/Demeanor/Rapport: Engaged, Gracious Affect (typically observed): Accepting, Appropriate, Calm, Pleasant Orientation: : Oriented to Self, Oriented to Place, Oriented to  Time, Oriented to Situation Alcohol / Substance Use: Not Applicable Psych Involvement: No (comment)  Admission diagnosis:  Frequent falls [R29.6] Patient Active Problem List   Diagnosis Date Noted   Protein-calorie malnutrition, severe 03/13/2023   Frequent falls 03/11/2023   Urinary tract infection 03/11/2023   Acute metabolic encephalopathy 03/11/2023   NPH (normal pressure hydrocephalus) (HCC) 03/11/2023   Frailty syndrome in geriatric patient 03/11/2023   General weakness 01/18/2023   COVID-19 virus infection 01/18/2023   Hypokalemia 01/18/2023   Dizziness 03/27/2022   Parkinson disease (HCC) 03/27/2022   Fall 03/26/2022   Physical deconditioning 03/26/2022   CAD (coronary artery disease) 03/25/2022   Hypertension 03/25/2022   Falls 03/25/2022   Thrombocytopenia (HCC) 03/25/2022   Atrial fibrillation (HCC) 03/25/2022   Chronic diastolic (congestive) heart failure (HCC) 03/25/2022   Right groin pain 05/17/2021   Near syncope 03/07/2019  Nocturia 06/26/2016   Lower abdominal pain 09/04/2015   Erectile dysfunction 08/30/2014   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:   [facility] -  [place], [place] - [address] [place] [address] Phone: [phone] Fax: [phone]  [facility] - [place], [place] - [address] [place] [address] Phone: [phone] Fax: [phone]  [id number] [place], [place] - [address] AT [facility] [address] [place] [address] Phone: [phone] Fax: [phone]     Social Determinants of Health (SDOH) Social History: SDOH Screenings   Food Insecurity: No Food Insecurity (03/11/2023)  Housing: Patient Declined (03/11/2023)  Transportation Needs: Unmet Transportation Needs (03/11/2023)  Utilities: Not At Risk (03/11/2023)  Tobacco Use: Medium Risk (03/11/2023)   SDOH Interventions: Transportation Interventions: Inpatient TOC, Other (Comment) (Resources added to AVS.)   Readmission Risk Interventions     No data to display

## 2023-03-13 NOTE — Assessment & Plan Note (Signed)
NPH on CT Parkinson disease/dementia Frailty and advanced age/physical deconditioning Unable to care for self PT and TOC consult, recommending SNF  Consider neurosurg outpatient evaluation for NPH seen on CT RPR negative, thyroid panel normal and B1 pending.

## 2023-03-14 DIAGNOSIS — G9341 Metabolic encephalopathy: Secondary | ICD-10-CM | POA: Diagnosis not present

## 2023-03-14 DIAGNOSIS — R296 Repeated falls: Secondary | ICD-10-CM | POA: Diagnosis not present

## 2023-03-14 DIAGNOSIS — G912 (Idiopathic) normal pressure hydrocephalus: Secondary | ICD-10-CM | POA: Diagnosis not present

## 2023-03-14 DIAGNOSIS — I10 Essential (primary) hypertension: Secondary | ICD-10-CM | POA: Diagnosis not present

## 2023-03-14 LAB — URINE CULTURE: Culture: >=100000 — AB

## 2023-03-14 LAB — PHOSPHORUS: Phosphorus: 3.4 mg/dL (ref 2.5–4.6)

## 2023-03-14 LAB — ZINC: Zinc: 45 ug/dL (ref 44–115)

## 2023-03-14 LAB — MAGNESIUM: Magnesium: 2 mg/dL (ref 1.7–2.4)

## 2023-03-14 MED ORDER — CEPHALEXIN 500 MG PO CAPS
500.0000 mg | ORAL_CAPSULE | Freq: Two times a day (BID) | ORAL | Status: DC
  Administered 2023-03-14 – 2023-03-15 (×2): 500 mg via ORAL
  Filled 2023-03-14 (×2): qty 1

## 2023-03-14 NOTE — Progress Notes (Signed)
Mobility Specialist - Progress Note   03/14/23 1133  Mobility  Activity Ambulated with assistance in room;Transferred from bed to chair  Level of Assistance Minimal assist, patient does 75% or more  Assistive Device Front wheel walker  Distance Ambulated (ft) 12 ft  Activity Response Tolerated well  $Mobility charge 1 Mobility     Pt lying in bed upon arrival, utilizing RA. Pt agreeable to activity. Completed bed mobility with supervision. Extra time needed for all transfers this date. Author later returned to room after assisting pt to [facility] for BM. Assist for peri-care d/t need for BUE support. STS with minA and time needed for anterior weight shifting. Ambulated to recliner with slow, cautious gait. No LOB. Forward flex push. Pt does remove hand from RW onto furniture for increased stability during gait. Pt left in chair with alarm set, needs in reach.    Filiberto Pinks Mobility Specialist 03/14/23, 11:43 AM

## 2023-03-14 NOTE — Progress Notes (Signed)
Patient is refusing all medication and vs, states he is ready to die. Patient denies any plan to self harm. Notified Dr. Nelson Chimes. Therapeutic communication to encourage pt.

## 2023-03-14 NOTE — Progress Notes (Signed)
Progress Note   Patient: Scott Cortez:[id number] DOB: 1933-10-23 DOA: 03/11/2023     3 DOS: the patient was seen and examined on 03/14/2023   Brief hospital course: Taken from H&P.  Scott Cortez is a 89 y.o. male with medical history significant for Being admitted for UTI, confusion,frequent falls following a recent discharge from rehab.  Patient has a history of coronary artery disease, AAA, diastolic dysfunction CHF, chronic pancreatitis, essential hypertension, dementia, Parkinson's disease, chronic physical deconditioning, and lived independently until a hospitalization from 9/6 to 01/29/23 for multifactorial weakness,COVID and UTI (<100,000 colonies Klebsiella and Enterococcus)  Patient previously had an APS evaluation prior to the hospitalization but was discharged home from the rehab facility.  Following his return home he went on to have multiple falls, most recently on the day of arrival when he was unable to get himself up off the ground.  He is also reportedly confused and has been incontinent of urine.   On presenting [facility] to ED, vitals normal, COVID remain positive since 01/18/2023.  UA positive for nitrite and many bacteria.  EKG with A-fib, rate at 82 and incomplete RBBB. CT head negative for any acute abnormality but consistent with NPH. CT cervical spine was negative for any acute injury. Patient was started on ceftriaxone for UTI.  10/29: Vitals stable, pending urine culture.  CT value of COVID test was 35.9 which is consistent with lingering positive and no reinfection so precautions were discontinued.  10/30: Vital stable, urine cultures with gram-negative rods PT is recommending SNF Multiple nutritional labs were sent as patient meet criteria for severe malnutrition.  10/31: Remained hemodynamically stable.  Awaiting SNF placement   Assessment and Plan: * Frequent falls NPH on CT Parkinson disease/dementia Frailty and advanced age/physical  deconditioning Unable to care for self PT and TOC consult, recommending SNF  Consider neurosurg outpatient evaluation for NPH seen on CT RPR negative, thyroid panel normal and B1 pending.   Acute metabolic encephalopathy Patient with acute confusion beyond baseline, mentation seems improved to baseline Possibly related to new UTI, recent COVID, underlying dementia NPH Delirium precautions, fall precautions Treat UTI  Urinary tract infection Recent UTI 01/18/2023 (<100,000 colonies Klebsiella and Enterococcus)  Repeat cultures with Klebsiella, shows only resistant to ampicillin and intermediate to nitrofurantoin -Ceftriaxone is being switched with Keflex to complete a total of 5-day course  Parkinson disease (HCC) Continue Sinemet and ropinirole  Hypertension Continue carvedilol  Chronic diastolic (congestive) heart failure (HCC) Clinically euvolemic Continue carvedilol  CAD (coronary artery disease) Continue carvedilol atorvastatin and apixaban  Atrial fibrillation (HCC) Continue carvedilol and apixaban  BPH with obstruction/lower urinary tract symptoms Continue tamsulosin  Protein-calorie malnutrition, severe Multiple nutritional labs were sent by dietitian-pending -Continue with supplement   Subjective: Patient was feeling much improved and no new concern.  Physical Exam: Vitals:   03/13/23 1952 03/14/23 0413 03/14/23 0754 03/14/23 1127  BP: 102/70 118/78 118/88 103/64  Pulse: 78 (!) 102 64 96  Resp: 17 17 16 18   Temp: 97.9 F (36.6 C) 97.7 F (36.5 C) 97.7 F (36.5 C) 98.3 F (36.8 C)  TempSrc: Oral Oral Oral   SpO2: 97% 97% 98% 96%  Weight:      Height:       General.  Frail and malnourished in no acute distress. Pulmonary.  Lungs clear bilaterally, normal respiratory effort. CV.  Regular rate and rhythm, no JVD, rub or murmur. Abdomen.  Soft, nontender, nondistended, BS positive. CNS.  Alert and oriented .  No focal neurologic deficit. Extremities.   No edema, no cyanosis, pulses intact and symmetrical.  Data Reviewed: Prior data reviewed  Family Communication:   Disposition: Status is: Inpatient Remains inpatient appropriate because:   Planned Discharge Destination:  To be determined  DVT prophylaxis.  Eliquis Time spent: 42 minutes  This record has been created using Conservation officer, historic buildings. Errors have been sought and corrected,but may not always be located. Such creation errors do not reflect on the standard of care.   Author: Arnetha Courser, MD 03/14/2023 3:51 PM  For on call review [email or web address].

## 2023-03-14 NOTE — Care Management Important Message (Signed)
Important Message  Patient Details  Name: Scott Cortez MRN: [id number] Date of Birth: 24-Feb-1934   Important Message Given:  N/A - LOS <3 / Initial given by admissions     [name] A Brae Gartman 03/14/2023, 12:21 PM

## 2023-03-14 NOTE — Progress Notes (Signed)
Physical Therapy Treatment Patient Details Name: Scott Cortez MRN: [id number] DOB: 1933-07-15 Today's Date: 03/14/2023   History of Present Illness Patient is a 89 year old male admitted from home with significant weakness, falls. History of dementia, Parkinson's. COVID +    PT Comments  Patient requesting to return to bed. Assistance required for transfer with cues for technique for safety. Increased time with all mobility tasks. Standing tolerance limited by fatigue and unable to progress ambulation at this time. Recommend rehabilitation <3 hours/day after this hospital stay.    If plan is discharge home, recommend the following: A lot of help with walking and/or transfers;A little help with bathing/dressing/bathroom;Assist for transportation;Help with stairs or ramp for entrance   Can travel by private vehicle     No  Equipment Recommendations  Rolling walker (2 wheels)    Recommendations for Other Services       Precautions / Restrictions Precautions Precautions: Fall Restrictions Weight Bearing Restrictions: No     Mobility  Bed Mobility Overal bed mobility: Needs Assistance Bed Mobility: Sit to Supine       Sit to supine: Min assist   General bed mobility comments: assistance for LLE support. significant increased time. cues for technique    Transfers Overall transfer level: Needs assistance Equipment used: Rolling walker (2 wheels) Transfers: Bed to chair/wheelchair/BSC Sit to Stand: Min assist   Step pivot transfers: Min assist       General transfer comment: steadying assistance provided with transfers. verbal cues for technique and to avoid holding rolling walker too far in front of base of support. trunk is flexed during transfer despite cues for technique    Ambulation/Gait             Pre-gait activities: cues for upright standing posture. standing activity tolerance limited for progression of ambulation at this time     Stairs              Wheelchair Mobility     Tilt Bed    Modified Rankin (Stroke Patients Only)       Balance Overall balance assessment: Needs assistance, History of Falls Sitting-balance support: Feet supported, Bilateral upper extremity supported Sitting balance-Leahy Scale: Good     Standing balance support: Bilateral upper extremity supported, During functional activity, Reliant on assistive device for balance Standing balance-Leahy Scale: Poor Standing balance comment: external support required                            Cognition Arousal: Alert Behavior During Therapy: WFL for tasks assessed/performed Overall Cognitive Status: History of cognitive impairments - at baseline                                 General Comments: patient perseverates at times and is particular with mobility. he directs care frequently        Exercises      General Comments        Pertinent Vitals/Pain Pain Assessment Pain Assessment: No/denies pain    Home Living                          Prior Function            PT Goals (current goals can now be found in the care plan section) Acute Rehab PT Goals Patient Stated Goal: improved balance and independence PT  Goal Formulation: With patient Time For Goal Achievement: 03/26/23 Potential to Achieve Goals: Good Progress towards PT goals: Progressing toward goals    Frequency    Min 1X/week      PT Plan      Co-evaluation              AM-PAC PT "6 Clicks" Mobility   Outcome Measure  Help needed turning from your back to your side while in a flat bed without using bedrails?: A Lot Help needed moving from lying on your back to sitting on the side of a flat bed without using bedrails?: A Lot Help needed moving to and from a bed to a chair (including a wheelchair)?: A Little Help needed standing up from a chair using your arms (e.g., wheelchair or bedside chair)?: A Little Help  needed to walk in hospital room?: A Little Help needed climbing 3-5 steps with a railing? : A Lot 6 Click Score: 15    End of Session   Activity Tolerance: Patient limited by fatigue Patient left: in bed;with bed alarm set;with call bell/phone within reach   PT Visit Diagnosis: Unsteadiness on feet (R26.81);Other abnormalities of gait and mobility (R26.89);Repeated falls (R29.6);Muscle weakness (generalized) (M62.81);History of falling (Z91.81);Difficulty in walking, not elsewhere classified (R26.2);Pain     Time: 0865-7846 PT Time Calculation (min) (ACUTE ONLY): 18 min  Charges:    $Therapeutic Activity: 8-22 mins PT General Charges $$ ACUTE PT VISIT: 1 Visit                     Donna Bernard, PT, MPT    Scott Cortez 03/14/2023, 2:40 PM

## 2023-03-14 NOTE — TOC Progression Note (Addendum)
Transition of Care [facility]) - Progression Note    Patient Details  Name: Rilan Honeck MRN: [id number] Date of Birth: 1934/03/10  [facility] CM/SW Contact  Margarito Liner, LCSW Phone Number: 03/14/2023, 11:51 AM  Clinical Narrative:  CSW provided update on bed offers. He asked CSW to call and discuss with his daughter. CSW left her a voicemail.   1:28 pm: CSW spoke to daughter. She will review potential SNF's with patient to determine preference. Daughter is planning on applying for Medicaid so he can have PCS services in place. CSW asked if he would be able to private pay for services until Medicaid and CAP services are in place. Daughter said it would depend on how much he owes for SNF. CSW made referral to [facility] with daughter's permission. CSW also sent secure email to financial counselor requesting that she call daughter with information on the Medicaid process and assist with application if possible.  Expected Discharge Plan: Skilled Nursing Facility Barriers to Discharge: SNF Pending bed offer  Expected Discharge Plan and Services     Post Acute Care Choice: Skilled Nursing Facility Living arrangements for the past 2 months: Single Family Home                                       Social Determinants of Health (SDOH) Interventions SDOH Screenings   Food Insecurity: No Food Insecurity (03/11/2023)  Housing: Patient Declined (03/11/2023)  Transportation Needs: Unmet Transportation Needs (03/11/2023)  Utilities: Not At Risk (03/11/2023)  Tobacco Use: Medium Risk (03/11/2023)    Readmission Risk Interventions     No data to display

## 2023-03-14 NOTE — Progress Notes (Signed)
OT Cancellation Note  Patient Details Name: Scott Cortez MRN: [id number] DOB: [birth year]   Cancelled Treatment:    Reason Eval/Treat Not Completed: Other (comment) (Pt refused OT session. He reported "I'm done, I'm through. I'm not taking any more medicine, no more therapy, I'm not eating. I'm ready to go." NT came in and pt refused to let him take vitals. Due to sudden change in mental status, nurse notified.) Inquired if pt would self harm and he responded "no, I'm just stopping everything, giving up."  Constance Goltz 03/14/2023, 4:12 PM

## 2023-03-15 DIAGNOSIS — R296 Repeated falls: Secondary | ICD-10-CM | POA: Diagnosis not present

## 2023-03-15 DIAGNOSIS — G9341 Metabolic encephalopathy: Secondary | ICD-10-CM | POA: Diagnosis not present

## 2023-03-15 DIAGNOSIS — I10 Essential (primary) hypertension: Secondary | ICD-10-CM | POA: Diagnosis not present

## 2023-03-15 DIAGNOSIS — G912 (Idiopathic) normal pressure hydrocephalus: Secondary | ICD-10-CM | POA: Diagnosis not present

## 2023-03-15 LAB — PHOSPHORUS: Phosphorus: 3.3 mg/dL (ref 2.5–4.6)

## 2023-03-15 LAB — MAGNESIUM: Magnesium: 2.1 mg/dL (ref 1.7–2.4)

## 2023-03-15 LAB — VITAMIN B1: Vitamin B1 (Thiamine): 71.3 nmol/L (ref 66.5–200.0)

## 2023-03-15 MED ORDER — CEPHALEXIN 500 MG PO CAPS: 500.0000 mg | ORAL_CAPSULE | Freq: Two times a day (BID) | ORAL | Status: DC

## 2023-03-15 MED ORDER — CEPHALEXIN 500 MG PO CAPS: 500.0000 mg | ORAL_CAPSULE | Freq: Two times a day (BID) | ORAL | Status: AC

## 2023-03-15 MED ORDER — THIAMINE HCL 100 MG PO TABS: 100.0000 mg | ORAL_TABLET | Freq: Every day | ORAL | Status: AC

## 2023-03-15 NOTE — Progress Notes (Signed)
Mobility Specialist - Progress Note   Pre-mobility: HR, BP, SpO2 During mobility: HR, BP, SpO2 Post-mobility: HR, BP, SPO2     03/15/23 1100  Mobility  Activity Stood at bedside;Transferred from chair to bed   Author responding to chair alarm patient endorses pain in butt from recliner upon entry on RA. Pt STS MinA with Mod hold and foot blocking to prevent fall. Pt had slight knee buckling. Pt returned to bed and left with needs in reach. Bed alarm activated.   Johnathan Hausen Mobility Specialist 03/15/23, 11:35 AM

## 2023-03-15 NOTE — Discharge Summary (Addendum)
Physician Discharge Summary   Patient: Scott Cortez MRN: [id number] DOB: 1933-07-17  Admit date:     03/11/2023  Discharge date: 03/15/23  Discharge Physician: Arnetha Courser   [facility]P: [facility]   Recommendations at discharge:  Please obtain CBC and BMP and follow-up Follow-up with primary care provider Follow-up with outpatient neurology for concern of NPH  Discharge Diagnoses: Principal Problem:   Frequent falls Active Problems:   Acute metabolic encephalopathy   Frailty syndrome in geriatric patient   NPH (normal pressure hydrocephalus) (HCC)   Hypertension   Parkinson disease (HCC)   Urinary tract infection   Chronic diastolic (congestive) heart failure (HCC)   Physical deconditioning   CAD (coronary artery disease)   Atrial fibrillation (HCC)   BPH with obstruction/lower urinary tract symptoms   Protein-calorie malnutrition, severe   Hospital Course: Taken from H&P.  Scott Cortez is a 89 y.o. male with medical history significant for Being admitted for UTI, confusion,frequent falls following a recent discharge from rehab.  Patient has a history of coronary artery disease, AAA, diastolic dysfunction CHF, chronic pancreatitis, essential hypertension, dementia, Parkinson's disease, chronic physical deconditioning, and lived independently until a hospitalization from 9/6 to 01/29/23 for multifactorial weakness,COVID and UTI (<100,000 colonies Klebsiella and Enterococcus)  Patient previously had an APS evaluation prior to the hospitalization but was discharged home from the rehab facility.  Following his return home he went on to have multiple falls, most recently on the day of arrival when he was unable to get himself up off the ground.  He is also reportedly confused and has been incontinent of urine.   On presenting danshen to ED, vitals normal, COVID remain positive since 01/18/2023.  UA positive for nitrite and many bacteria.  EKG with  A-fib, rate at 82 and incomplete RBBB. CT head negative for any acute abnormality but consistent with NPH. CT cervical spine was negative for any acute injury. Patient was started on ceftriaxone for UTI.  10/29: Vitals stable, pending urine culture.  CT value of COVID test was 35.9 which is consistent with lingering positive and no reinfection so precautions were discontinued.  10/30: Vital stable, urine cultures with gram-negative rods PT is recommending SNF Multiple nutritional labs were sent as patient meet criteria for severe malnutrition. Patient will continue on boost or Ensure to supplement diet.  10/31: Remained hemodynamically stable.  Awaiting SNF placement.  11/1: Remained hemodynamically stable and is being discharged to rehab for further management.  Patient will continue on current medications and need to have a close follow-up with his providers for further care.  Assessment and Plan: * Frequent falls NPH on CT Parkinson disease/dementia Frailty and advanced age/physical deconditioning Unable to care for self PT and TOC consult, recommending SNF  Consider neurosurg outpatient evaluation for NPH seen on CT RPR negative, thyroid panel normal and B1 pending.   Acute metabolic encephalopathy Patient with acute confusion beyond baseline, mentation seems improved to baseline Possibly related to new UTI, recent COVID, underlying dementia NPH Delirium precautions, fall precautions Treat UTI  Urinary tract infection Recent UTI 01/18/2023 (<100,000 colonies Klebsiella and Enterococcus)  Repeat cultures with Klebsiella, shows only resistant to ampicillin and intermediate to nitrofurantoin -Ceftriaxone is being switched with Keflex to complete a total of 5-day course  Parkinson disease (HCC) Continue Sinemet and ropinirole  Hypertension Continue carvedilol  Chronic diastolic (congestive) heart failure (HCC) Clinically euvolemic Continue carvedilol  CAD (coronary  artery disease) Continue carvedilol atorvastatin and apixaban  Atrial fibrillation (HCC)  Continue carvedilol and apixaban  BPH with obstruction/lower urinary tract symptoms Continue tamsulosin  Protein-calorie malnutrition, severe Multiple nutritional labs were sent by dietitian-pending -Continue with supplement         Consultants: None Procedures performed: None Disposition: Skilled nursing facility Diet recommendation:  Discharge Diet Orders (From admission, onward)     Start     Ordered   03/15/23 0000  Diet - low sodium heart healthy        03/15/23 1452           Cardiac diet DISCHARGE MEDICATION: Allergies as of 03/15/2023       Reactions   Pneumococcal Vaccines Swelling        Medication List     TAKE these medications    acetaminophen 500 MG tablet Commonly known as: TYLENOL Take 1,000 mg by mouth every 8 (eight) hours as needed for mild pain.   apixaban 5 MG Tabs tablet Commonly known as: ELIQUIS Take 1 tablet (5 mg total) by mouth 2 (two) times daily.   atorvastatin 20 MG tablet Commonly known as: LIPITOR Take 20 mg by mouth daily.   carbidopa-levodopa 50-200 MG tablet Commonly known as: SINEMET CR Take 1 tablet by mouth at bedtime.   carbidopa-levodopa 25-100 MG tablet Commonly known as: SINEMET IR Take 2 tablets by mouth 4 (four) times daily. At 0800, 1100, 1400, 1730   carvedilol 6.25 MG tablet Commonly known as: COREG Hold until outpatient followup due to low blood pressure.   cephALEXin 500 MG capsule Commonly known as: KEFLEX Take 1 capsule (500 mg total) by mouth every 12 (twelve) hours for 3 doses.   feeding supplement Liqd Take 237 mLs by mouth 3 (three) times daily between meals.   multivitamin with minerals Tabs tablet Take 1 tablet by mouth daily.   polyethylene glycol 17 g packet Commonly known as: MIRALAX / GLYCOLAX Take 17 g by mouth daily as needed for mild constipation.   rOPINIRole 0.25 MG  tablet Commonly known as: REQUIP Take 0.25 mg by mouth at bedtime.   tamsulosin 0.4 MG Caps capsule Commonly known as: FLOMAX Take 1 capsule (0.4 mg total) by mouth daily. What changed:  when to take this additional instructions   thiamine 100 MG tablet Commonly known as: VITAMIN B1 Take 1 tablet (100 mg total) by mouth daily. Start taking on: March 16, 2023        Discharge Exam: Ceasar Mons Weights   03/12/23 1431 03/13/23 0328  Weight: 65.3 kg 66.4 kg   General.  Frail elderly man, in no acute distress. Pulmonary.  Lungs clear bilaterally, normal respiratory effort. CV.  Regular rate and rhythm, no JVD, rub or murmur. Abdomen.  Soft, nontender, nondistended, BS positive. CNS.  Alert and oriented .  No focal neurologic deficit. Extremities.  No edema, no cyanosis, pulses intact and symmetrical.  Condition at discharge: stable  The results of significant diagnostics from this hospitalization (including imaging, microbiology, ancillary and laboratory) are listed below for reference.   Imaging Studies: CT HEAD WO CONTRAST ( )  Result Date: 03/11/2023 CLINICAL DATA:  Fall. EXAM: CT HEAD WITHOUT CONTRAST CT CERVICAL SPINE WITHOUT CONTRAST TECHNIQUE: Multidetector CT imaging of the head and cervical spine was performed following the standard protocol without intravenous contrast. Multiplanar CT image reconstructions of the cervical spine were also generated. RADIATION DOSE REDUCTION: This exam was performed according to the departmental dose-optimization program which includes automated exposure control, adjustment of the mA and/or kV according to patient size and/or use of  iterative reconstruction technique. COMPARISON:  Head CT 01/02/2023. FINDINGS: CT HEAD FINDINGS Brain: No acute hemorrhage. 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 Cortical gray-white differentiation is otherwise preserved. Disproportionate enlargement of  the lateral and third ventricles relative to the sulci with acute callosal angle and elevated Evans index. No extra-axial collection, mass effect or midline shift. Vascular: No hyperdense vessel or unexpected calcification. Skull: No calvarial fracture or suspicious bone lesion. Skull base is unremarkable. Sinuses/Orbits: No acute finding. Other: None. CT CERVICAL SPINE FINDINGS Alignment: Normal. Skull base and vertebrae: No acute fracture. Normal craniocervical junction. No suspicious bone lesions. Soft tissues and spinal canal: No prevertebral fluid or swelling. No visible canal hematoma. Disc levels: Multilevel cervical spondylosis, worst at C6-7, where there is no more than mild spinal canal stenosis. Upper chest: No acute findings. Other: Atherosclerotic calcifications of the carotid bulbs. IMPRESSION: 1. No acute intracranial abnormality. 2. Disproportionate enlargement of the lateral and third ventricles relative to the sulci with acute callosal angle and elevated Evans index, which can be seen in the setting of normal pressure hydrocephalus. 3. No acute cervical spine fracture or traumatic listhesis. Electronically Signed   By: Orvan Falconer M.D.   On: 03/11/2023 17:28   CT Cervical Spine Wo Contrast  Result Date: 03/11/2023 CLINICAL DATA:  Fall. EXAM: CT HEAD WITHOUT CONTRAST CT CERVICAL SPINE WITHOUT CONTRAST TECHNIQUE: Multidetector CT imaging of the head and cervical spine was performed following the standard protocol without intravenous contrast. Multiplanar CT image reconstructions of the cervical spine were also generated. RADIATION DOSE REDUCTION: This exam was performed according to the departmental dose-optimization program which includes automated exposure control, adjustment of the mA and/or kV according to patient size and/or use of iterative reconstruction technique. COMPARISON:  Head CT 01/02/2023. FINDINGS: CT HEAD FINDINGS Brain: No acute hemorrhage. 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 Cortical gray-white differentiation is otherwise preserved. Disproportionate enlargement of the lateral and third ventricles relative to the sulci with acute callosal angle and elevated Evans index. No extra-axial collection, mass effect or midline shift. Vascular: No hyperdense vessel or unexpected calcification. Skull: No calvarial fracture or suspicious bone lesion. Skull base is unremarkable. Sinuses/Orbits: No acute finding. Other: None. CT CERVICAL SPINE FINDINGS Alignment: Normal. Skull base and vertebrae: No acute fracture. Normal craniocervical junction. No suspicious bone lesions. Soft tissues and spinal canal: No prevertebral fluid or swelling. No visible canal hematoma. Disc levels: Multilevel cervical spondylosis, worst at C6-7, where there is no more than mild spinal canal stenosis. Upper chest: No acute findings. Other: Atherosclerotic calcifications of the carotid bulbs. IMPRESSION: 1. No acute intracranial abnormality. 2. Disproportionate enlargement of the lateral and third ventricles relative to the sulci with acute callosal angle and elevated Evans index, which can be seen in the setting of normal pressure hydrocephalus. 3. No acute cervical spine fracture or traumatic listhesis. Electronically Signed   By: Orvan Falconer M.D.   On: 03/11/2023 17:28    Microbiology: Results for orders placed or performed during the hospital encounter of 03/11/23  Resp panel by RT-PCR (RSV, Flu A&B, Covid) Anterior Nasal Swab     Status: Abnormal   Collection Time: 03/11/23  8:20 PM   Specimen: Anterior Nasal Swab  Result Value Ref Range Status   SARS Coronavirus 2 by RT PCR POSITIVE (A) NEGATIVE Final    Comment: (NOTE) SARS-CoV-2 target nucleic acids are DETECTED.  The SARS-CoV-2 RNA is generally detectable in upper  respiratory specimens during the acute phase of infection. Positive results are indicative of the presence of the  identified virus, but do not rule out bacterial infection or co-infection with other pathogens not detected by the test. Clinical correlation with patient history and other diagnostic information is necessary to determine patient infection status. The expected result is Negative.  Fact Sheet for Patients: BloggerCourse.com  Fact Sheet for Healthcare Providers: SeriousBroker.it  This test is not yet approved or cleared by the [place] FDA and  has been authorized for detection and/or diagnosis of SARS-CoV-2 by FDA under an Emergency Use Authorization (EUA).  This EUA will remain in effect (meaning this test can be used) for the duration of  the COVID-19 declaration under Section 564(b)(1) of the A ct, 21 U.S.C. section 360bbb-3(b)(1), unless the authorization is terminated or revoked sooner.     Influenza A by PCR NEGATIVE NEGATIVE Final   Influenza B by PCR NEGATIVE NEGATIVE Final    Comment: (NOTE) The Xpert Xpress SARS-CoV-2/FLU/RSV plus assay is intended as an aid in the diagnosis of influenza from Nasopharyngeal swab specimens and should not be used as a sole basis for treatment. Nasal washings and aspirates are unacceptable for Xpert Xpress SARS-CoV-2/FLU/RSV testing.  Fact Sheet for Patients: BloggerCourse.com  Fact Sheet for Healthcare Providers: SeriousBroker.it  This test is not yet approved or cleared by the [place] FDA and has been authorized for detection and/or diagnosis of SARS-CoV-2 by FDA under an Emergency Use Authorization (EUA). This EUA will remain in effect (meaning this test can be used) for the duration of the COVID-19 declaration under Section 564(b)(1) of the Act, 21 U.S.C. section 360bbb-3(b)(1), unless the authorization is terminated or revoked.     Resp Syncytial Virus by PCR NEGATIVE NEGATIVE Final    Comment: (NOTE) Fact Sheet for  Patients: BloggerCourse.com  Fact Sheet for Healthcare Providers: SeriousBroker.it  This test is not yet approved or cleared by the [place] FDA and has been authorized for detection and/or diagnosis of SARS-CoV-2 by FDA under an Emergency Use Authorization (EUA). This EUA will remain in effect (meaning this test can be used) for the duration of the COVID-19 declaration under Section 564(b)(1) of the Act, 21 U.S.C. section 360bbb-3(b)(1), unless the authorization is terminated or revoked.  Performed at [facility], [address] [place], [place] [address]   Urine Culture     Status: Abnormal   Collection Time: 03/11/23  8:29 PM   Specimen: Urine, Random  Result Value Ref Range Status   Specimen Description   Final    URINE, RANDOM Performed at [facility], [address], [place], [place] [address]    Special Requests   Final    NONE Performed at [facility], [address] [place], [place] [address]    Culture >=100,000 COLONIES/mL KLEBSIELLA PNEUMONIAE (A)  Final   Report Status 03/14/2023 FINAL  Final   Organism ID, Bacteria KLEBSIELLA PNEUMONIAE (A)  Final      Susceptibility   Klebsiella pneumoniae - MIC*    AMPICILLIN RESISTANT Resistant     CEFAZOLIN <=4 SENSITIVE Sensitive     CEFEPIME <=0.12 SENSITIVE Sensitive     CEFTRIAXONE <=0.25 SENSITIVE Sensitive     CIPROFLOXACIN <=0.25 SENSITIVE Sensitive     GENTAMICIN <=1 SENSITIVE Sensitive     IMIPENEM <=0.25 SENSITIVE Sensitive     NITROFURANTOIN 64 INTERMEDIATE Intermediate     TRIMETH/SULFA <=20 SENSITIVE Sensitive     AMPICILLIN/SULBACTAM <=2 SENSITIVE Sensitive  PIP/TAZO <=4 SENSITIVE Sensitive ug/mL    * >=100,000 COLONIES/mL KLEBSIELLA PNEUMONIAE    Labs: CBC: Recent Labs  Lab 03/11/23 1548  WBC 6.9  HGB 11.9*  HCT 34.6*  MCV 93.5  PLT 129*   Basic Metabolic Panel: Recent Labs  Lab 03/11/23 1548  03/13/23 0624 03/14/23 0427 03/15/23 0631  NA 137  --   --   --   K 3.7  --   --   --   CL 103  --   --   --   CO2 26  --   --   --   GLUCOSE 96  --   --   --   BUN 23  --   --   --   CREATININE 0.68  --   --   --   CALCIUM 8.8*  --   --   --   MG  --  2.0 2.0 2.1  PHOS  --  3.0 3.4 3.3   Liver Function Tests: No results for input(s): "AST", "ALT", "ALKPHOS", "BILITOT", "PROT", "ALBUMIN" in the last 168 hours. CBG: No results for input(s): "GLUCAP" in the last 168 hours.  Discharge time spent: greater than 30 minutes.  This record has been created using Conservation officer, historic buildings. Errors have been sought and corrected,but may not always be located. Such creation errors do not reflect on the standard of care.   Signed: Arnetha Courser, MD [facility] 03/15/2023

## 2023-03-15 NOTE — Progress Notes (Signed)
PT Cancellation Note  Patient Details Name: Kalif Kattner MRN: [id number] DOB: 1933/07/02   Cancelled Treatment:     Pt in bed, was in recliner earlier after finishing with OT.  Pt politely declined PT services due to fatigue and still eating lunch. Will re-attempt next available date/time per POC   Jannet Askew 03/15/2023, 2:27 PM

## 2023-03-15 NOTE — TOC Transition Note (Signed)
Transition of Care [facility]) - CM/SW Discharge Note  Patient Details  Name: Scott Cortez MRN: [id number] Date of Birth: 1933-10-07  Transition of Care [facility]) CM/SW Contact:  Helene Kelp, LCSW Phone Number: 03/15/2023, 3:48 PM  Clinical Narrative:     CSW followed-up with the clinical team to address the patient's disposition [facility]) .   After communicating with the clinical team (via chat), the CSW was able to follow-up the patient's disposition.   This Clinical research associate made contact with SNF facility representative Morrie Sheldon)  and obtained facility transfer information.Nurse report and room number to coordinate patient discharge transfer to the facility.   CSW met with the patient at the bedside and updated them to their current disposition and coordination efforts.   CSW contacted the patient's natural support Caprice Kluver ) and updated them to the patient's current disposition and coordination efforts. Cala Bradford) noted sh was ok with the patient's disposition and all efforts to get him placed. Kim reviewed with the CMS the pt and her were in agreement with the patient's referral being accepted to [facility] [place] / [phone].   CSW updated clinical team (via chat) regarding the information above and noted [facility] transport will be arranged.   This Clinical research associate followed-up the discharge process and arranged transportation [facility]) for the patient to the facility. CSW spoke with [facility] representative Administrator, arts) to facilitate transport:  Date: 03/15/23 Time: 5:30 PM  CSW updated the clinical team to the above information, along with the patient and natural supports Cala Bradford).   Transfer process update: CSW contacted pt family/natural supports to update pt's disposition (transfer to noted facility, accepting of patient's referral and transfer date and time). CSW arranged ambulance transport via [facility] to [facility] [place] [facility] [place]).   CSW provided RN with call report number  [phone] & room#: [id number]) prior to discharge. CSW provied bedside RN with discharge documentation needed for transfer at the nrusing station.  CSW reviewed all information above with the patient.   No other needs identified of this Clinical research associate. Please contact TOC if there is additional disposition support needed.   Final next level of care: Skilled Nursing Facility Barriers to Discharge: Continued Medical Work up   Patient Goals and CMS Choice CMS Medicare.gov Compare Post Acute Care list provided to:: Patient Represenative (must comment) (Daughter Cala Bradford - info on face sheet)  Discharge Placement [facility]   Patient to be transferred to facility by: [facility] Name of family member notified: Caprice Kluver Patient and family notified of of transfer: 03/15/23  Discharge Plan and Services Additional resources added to the After Visit Summary for       Post Acute Care Choice: Skilled Nursing Facility              Social Determinants of Health (SDOH) Interventions SDOH Screenings   Food Insecurity: No Food Insecurity (03/11/2023)  Housing: Patient Declined (03/11/2023)  Transportation Needs: Unmet Transportation Needs (03/11/2023)  Utilities: Not At Risk (03/11/2023)  Tobacco Use: Medium Risk (03/11/2023)   Readmission Risk Interventions     No data to display

## 2023-03-15 NOTE — Care Management Important Message (Signed)
Important Message  Patient Details  Name: Scott Cortez MRN: [id number] Date of Birth: [birth year]   Important Message Given:  N/A - LOS <3 / Initial given by admissions     [name] A [name] 03/15/2023, 3:53 PM

## 2023-03-15 NOTE — Progress Notes (Signed)
Occupational Therapy Treatment Patient Details Name: Scott Cortez MRN: [id number] DOB: 11-19-1933 Today's Date: 03/15/2023   History of present illness Patient is a 89 year old male admitted from home with significant weakness, falls. History of dementia, Parkinson's. Recent hospital stay with COVID.   OT comments  Pt is supine in bed on arrival. Pleasant and agreeable to OT session. He does not report any pain. Pt performed supine to sit at EOB with CGA/Min A for scooting to reach EOB. Needed Max A to don socks seated EOB d/t initial seated balance deficits. Bed elevated and pt performed STS from EOB with Min A for posterior bias and pt pulling on RW more than pushing up from the bed. Min A provided throughout for safety with SPT to recliner requiring increased time and multimodal cues for foot placement, hand placement and walker management during transfer. Pt able to scoot himself back in recliner with SUP. He was set up with breakfast tray and performed self feeding on his own with coughing noted while eating magic cup-NT notified. He remains weak requiring assist with all bed mobility, transfers and ADLs warranting further skilled acute OT services to maximize his safety and IND to return to PLOF. Left in recliner with all needs in place.       If plan is discharge home, recommend the following:  A little help with bathing/dressing/bathroom;Assistance with cooking/housework;Help with stairs or ramp for entrance;Assist for transportation;A lot of help with walking and/or transfers   Equipment Recommendations  Other (comment) (defer)    Recommendations for Other Services      Precautions / Restrictions Precautions Precautions: Fall Restrictions Weight Bearing Restrictions: No       Mobility Bed Mobility Overal bed mobility: Needs Assistance Bed Mobility: Supine to Sit     Supine to sit: Supervision, HOB elevated, Used rails     General bed mobility comments: extra  time for pt to reach EOB and needed assist to scoot fully to EOB    Transfers Overall transfer level: Needs assistance Equipment used: Rolling walker (2 wheels) Transfers: Bed to chair/wheelchair/BSC, Sit to/from Stand Sit to Stand: Min assist, From elevated surface     Step pivot transfers: Min assist     General transfer comment: Min A with bed elevated to reach standing with posterior lean initally, pt prefers to pull on RW more so than push up from bed; needs increased time to get to recliner with Min A for walker management     Balance Overall balance assessment: Needs assistance, History of Falls Sitting-balance support: Feet supported, Bilateral upper extremity supported Sitting balance-Leahy Scale: Fair Sitting balance - Comments: fair inital seated balance at EOB for first time getting up today   Standing balance support: Bilateral upper extremity supported, During functional activity, Reliant on assistive device for balance Standing balance-Leahy Scale: Poor Standing balance comment: needs Min A to maintain balance and reliant on device                           ADL either performed or assessed with clinical judgement   ADL Overall ADL's : Needs assistance/impaired Eating/Feeding: Set up;Sitting                   Lower Body Dressing: Maximal assistance Lower Body Dressing Details (indicate cue type and reason): Max A to don socks at EOB today d/t initial seated balance fair  Extremity/Trunk Assessment Upper Extremity Assessment Upper Extremity Assessment: Overall WFL for tasks assessed   Lower Extremity Assessment Lower Extremity Assessment: Generalized weakness        Vision       Perception     Praxis      Cognition Arousal: Alert Behavior During Therapy: WFL for tasks assessed/performed Overall Cognitive Status: History of cognitive impairments - at baseline                                  General Comments: patient perseverates at times and is particular with mobility. he directs care frequently        Exercises Other Exercises Other Exercises: Educated pt on importance of mobility and getting OOB daily to promote improved strength.    Shoulder Instructions       General Comments      Pertinent Vitals/ Pain       Pain Assessment Pain Assessment: No/denies pain  Home Living                                          Prior Functioning/Environment              Frequency  Min 1X/week        Progress Toward Goals  OT Goals(current goals can now be found in the care plan section)  Progress towards OT goals: Progressing toward goals  Acute Rehab OT Goals Patient Stated Goal: improve strength OT Goal Formulation: With patient Time For Goal Achievement: 03/27/23 Potential to Achieve Goals: Fair  Plan      Co-evaluation                 AM-PAC OT "6 Clicks" Daily Activity     Outcome Measure   Help from another person eating meals?: None Help from another person taking care of personal grooming?: None Help from another person toileting, which includes using toliet, bedpan, or urinal?: A Little Help from another person bathing (including washing, rinsing, drying)?: A Lot Help from another person to put on and taking off regular upper body clothing?: A Little Help from another person to put on and taking off regular lower body clothing?: A Lot 6 Click Score: 18    End of Session Equipment Utilized During Treatment: Rolling walker (2 wheels);Gait belt  OT Visit Diagnosis: Unsteadiness on feet (R26.81);Other abnormalities of gait and mobility (R26.89);History of falling (Z91.81);Muscle weakness (generalized) (M62.81)   Activity Tolerance Patient tolerated treatment well   Patient Left in chair;with call bell/phone within reach;with chair alarm set   Nurse Communication Mobility status        Time: 1000-1021 OT Time  Calculation (min): 21 min  Charges: OT General Charges $OT Visit: 1 Visit OT Treatments $Therapeutic Activity: 8-22 mins  Kayla Deshaies, OTR/L  03/15/23, 10:35 AM  Constance Goltz 03/15/2023, 10:32 AM

## 2023-03-16 LAB — VITAMIN A: Vitamin A (Retinoic Acid): 22.3 ug/dL (ref 22.0–69.5)

## 2023-05-12 ENCOUNTER — Other Ambulatory Visit: Payer: Self-pay

## 2023-05-12 ENCOUNTER — Encounter: Payer: Self-pay | Admitting: Emergency Medicine

## 2023-05-12 DIAGNOSIS — I252 Old myocardial infarction: Secondary | ICD-10-CM

## 2023-05-12 DIAGNOSIS — I110 Hypertensive heart disease with heart failure: Secondary | ICD-10-CM | POA: Diagnosis present

## 2023-05-12 DIAGNOSIS — G20A1 Parkinson's disease without dyskinesia, without mention of fluctuations: Secondary | ICD-10-CM | POA: Diagnosis present

## 2023-05-12 DIAGNOSIS — R296 Repeated falls: Secondary | ICD-10-CM | POA: Diagnosis present

## 2023-05-12 DIAGNOSIS — Z7901 Long term (current) use of anticoagulants: Secondary | ICD-10-CM

## 2023-05-12 DIAGNOSIS — Z66 Do not resuscitate: Secondary | ICD-10-CM | POA: Diagnosis present

## 2023-05-12 DIAGNOSIS — E871 Hypo-osmolality and hyponatremia: Secondary | ICD-10-CM | POA: Diagnosis present

## 2023-05-12 DIAGNOSIS — I5032 Chronic diastolic (congestive) heart failure: Secondary | ICD-10-CM | POA: Diagnosis present

## 2023-05-12 DIAGNOSIS — R531 Weakness: Secondary | ICD-10-CM | POA: Diagnosis not present

## 2023-05-12 DIAGNOSIS — G629 Polyneuropathy, unspecified: Secondary | ICD-10-CM | POA: Diagnosis present

## 2023-05-12 DIAGNOSIS — Z8249 Family history of ischemic heart disease and other diseases of the circulatory system: Secondary | ICD-10-CM

## 2023-05-12 DIAGNOSIS — I429 Cardiomyopathy, unspecified: Secondary | ICD-10-CM | POA: Diagnosis present

## 2023-05-12 DIAGNOSIS — Z1152 Encounter for screening for COVID-19: Secondary | ICD-10-CM

## 2023-05-12 DIAGNOSIS — I4891 Unspecified atrial fibrillation: Secondary | ICD-10-CM | POA: Diagnosis present

## 2023-05-12 DIAGNOSIS — Z681 Body mass index (BMI) 19 or less, adult: Secondary | ICD-10-CM

## 2023-05-12 DIAGNOSIS — Z79899 Other long term (current) drug therapy: Secondary | ICD-10-CM

## 2023-05-12 DIAGNOSIS — E872 Acidosis, unspecified: Secondary | ICD-10-CM | POA: Diagnosis present

## 2023-05-12 DIAGNOSIS — B964 Proteus (mirabilis) (morganii) as the cause of diseases classified elsewhere: Secondary | ICD-10-CM | POA: Diagnosis present

## 2023-05-12 DIAGNOSIS — E43 Unspecified severe protein-calorie malnutrition: Secondary | ICD-10-CM | POA: Diagnosis present

## 2023-05-12 DIAGNOSIS — E7800 Pure hypercholesterolemia, unspecified: Secondary | ICD-10-CM | POA: Diagnosis present

## 2023-05-12 DIAGNOSIS — K219 Gastro-esophageal reflux disease without esophagitis: Secondary | ICD-10-CM | POA: Diagnosis present

## 2023-05-12 DIAGNOSIS — R64 Cachexia: Secondary | ICD-10-CM | POA: Diagnosis present

## 2023-05-12 DIAGNOSIS — I2510 Atherosclerotic heart disease of native coronary artery without angina pectoris: Secondary | ICD-10-CM | POA: Diagnosis present

## 2023-05-12 DIAGNOSIS — I700 Atherosclerosis of aorta: Secondary | ICD-10-CM | POA: Diagnosis present

## 2023-05-12 DIAGNOSIS — N138 Other obstructive and reflux uropathy: Secondary | ICD-10-CM | POA: Diagnosis present

## 2023-05-12 DIAGNOSIS — Z87891 Personal history of nicotine dependence: Secondary | ICD-10-CM

## 2023-05-12 DIAGNOSIS — Z888 Allergy status to other drugs, medicaments and biological substances status: Secondary | ICD-10-CM

## 2023-05-12 DIAGNOSIS — H409 Unspecified glaucoma: Secondary | ICD-10-CM | POA: Diagnosis present

## 2023-05-12 DIAGNOSIS — N401 Enlarged prostate with lower urinary tract symptoms: Secondary | ICD-10-CM | POA: Diagnosis present

## 2023-05-12 DIAGNOSIS — E861 Hypovolemia: Secondary | ICD-10-CM | POA: Diagnosis present

## 2023-05-12 DIAGNOSIS — G2581 Restless legs syndrome: Secondary | ICD-10-CM | POA: Diagnosis present

## 2023-05-12 DIAGNOSIS — F0283 Dementia in other diseases classified elsewhere, unspecified severity, with mood disturbance: Secondary | ICD-10-CM | POA: Diagnosis present

## 2023-05-12 DIAGNOSIS — N390 Urinary tract infection, site not specified: Secondary | ICD-10-CM | POA: Diagnosis present

## 2023-05-12 DIAGNOSIS — I959 Hypotension, unspecified: Secondary | ICD-10-CM | POA: Diagnosis not present

## 2023-05-12 DIAGNOSIS — K5641 Fecal impaction: Secondary | ICD-10-CM | POA: Diagnosis present

## 2023-05-12 DIAGNOSIS — Z955 Presence of coronary angioplasty implant and graft: Secondary | ICD-10-CM

## 2023-05-12 NOTE — ED Notes (Signed)
Condom Cath placed on patient to catch urine sample.

## 2023-05-12 NOTE — ED Triage Notes (Signed)
Pt to ED via EMS from home.  Lives alone and is bed bound but has people come check on him regularly. Pt states he called because he has not had a good BM today.  Also states burning with urination.  Pt cleaned in triage triage room and brief changed because pt had a large BM.  Pt A&Ox4, chest rise even and unlabored, skin WNL and in NAD at this time.

## 2023-05-13 ENCOUNTER — Inpatient Hospital Stay
Admission: EM | Admit: 2023-05-13 | Discharge: 2023-05-22 | DRG: 314 | Disposition: A | Discharge status: Skilled Nursing Facility | Payer: Medicare Other | Attending: Hospitalist | Admitting: Hospitalist

## 2023-05-13 DIAGNOSIS — G912 (Idiopathic) normal pressure hydrocephalus: Secondary | ICD-10-CM | POA: Diagnosis present

## 2023-05-13 DIAGNOSIS — A419 Sepsis, unspecified organism: Secondary | ICD-10-CM

## 2023-05-13 DIAGNOSIS — I959 Hypotension, unspecified: Secondary | ICD-10-CM | POA: Diagnosis present

## 2023-05-13 DIAGNOSIS — G20A1 Parkinson's disease without dyskinesia, without mention of fluctuations: Secondary | ICD-10-CM | POA: Diagnosis present

## 2023-05-13 DIAGNOSIS — N138 Other obstructive and reflux uropathy: Secondary | ICD-10-CM | POA: Diagnosis present

## 2023-05-13 DIAGNOSIS — F32A Depression, unspecified: Secondary | ICD-10-CM | POA: Insufficient documentation

## 2023-05-13 DIAGNOSIS — R531 Weakness: Principal | ICD-10-CM

## 2023-05-13 DIAGNOSIS — N401 Enlarged prostate with lower urinary tract symptoms: Secondary | ICD-10-CM | POA: Diagnosis present

## 2023-05-13 DIAGNOSIS — I2510 Atherosclerotic heart disease of native coronary artery without angina pectoris: Secondary | ICD-10-CM | POA: Diagnosis present

## 2023-05-13 DIAGNOSIS — Z7689 Persons encountering health services in other specified circumstances: Secondary | ICD-10-CM

## 2023-05-13 DIAGNOSIS — R5381 Other malaise: Secondary | ICD-10-CM | POA: Diagnosis present

## 2023-05-13 DIAGNOSIS — R7989 Other specified abnormal findings of blood chemistry: Secondary | ICD-10-CM

## 2023-05-13 DIAGNOSIS — I4891 Unspecified atrial fibrillation: Secondary | ICD-10-CM | POA: Diagnosis present

## 2023-05-13 DIAGNOSIS — E43 Unspecified severe protein-calorie malnutrition: Secondary | ICD-10-CM | POA: Diagnosis present

## 2023-05-13 DIAGNOSIS — I10 Essential (primary) hypertension: Secondary | ICD-10-CM | POA: Diagnosis present

## 2023-05-13 DIAGNOSIS — E785 Hyperlipidemia, unspecified: Secondary | ICD-10-CM | POA: Insufficient documentation

## 2023-05-13 DIAGNOSIS — R296 Repeated falls: Secondary | ICD-10-CM

## 2023-05-13 DIAGNOSIS — G2581 Restless legs syndrome: Secondary | ICD-10-CM | POA: Insufficient documentation

## 2023-05-13 LAB — COMPREHENSIVE METABOLIC PANEL
ALT: 18 U/L (ref 0–44)
AST: 25 U/L (ref 15–41)
Albumin: 3.3 g/dL — ABNORMAL LOW (ref 3.5–5.0)
Alkaline Phosphatase: 100 U/L (ref 38–126)
Anion gap: 7 (ref 5–15)
BUN: 18 mg/dL (ref 8–23)
CO2: 28 mmol/L (ref 22–32)
Calcium: 8.9 mg/dL (ref 8.9–10.3)
Chloride: 100 mmol/L (ref 98–111)
Creatinine, Ser: 0.71 mg/dL (ref 0.61–1.24)
GFR, Estimated: >60 mL/min (ref >60)
Glucose, Bld: 92 mg/dL (ref 70–99)
Potassium: 4.5 mmol/L (ref 3.5–5.1)
Sodium: 135 mmol/L (ref 135–145)
Total Bilirubin: 1.6 mg/dL — ABNORMAL HIGH (ref <1.2)
Total Protein: 6.6 g/dL (ref 6.5–8.1)

## 2023-05-13 LAB — CBC WITH DIFFERENTIAL/PLATELET
Abs Immature Granulocytes: 0.01 10*3/uL (ref 0.00–0.07)
Basophils Absolute: 0 10*3/uL (ref 0.0–0.1)
Basophils Relative: 1 %
Eosinophils Absolute: 0.1 10*3/uL (ref 0.0–0.5)
Eosinophils Relative: 2 %
HCT: 44 % (ref 39.0–52.0)
Hemoglobin: 15 g/dL (ref 13.0–17.0)
Immature Granulocytes: 0 %
Lymphocytes Relative: 20 %
Lymphs Abs: 0.9 10*3/uL (ref 0.7–4.0)
MCH: 31.4 pg (ref 26.0–34.0)
MCHC: 34.1 g/dL (ref 30.0–36.0)
MCV: 92.2 fL (ref 80.0–100.0)
Monocytes Absolute: 0.5 10*3/uL (ref 0.1–1.0)
Monocytes Relative: 12 %
Neutro Abs: 3 10*3/uL (ref 1.7–7.7)
Neutrophils Relative %: 65 %
Platelets: 163 10*3/uL (ref 150–400)
RBC: 4.77 MIL/uL (ref 4.22–5.81)
RDW: 13.2 % (ref 11.5–15.5)
WBC: 4.6 10*3/uL (ref 4.0–10.5)
nRBC: 0 % (ref 0.0–0.2)

## 2023-05-13 LAB — URINALYSIS, ROUTINE W REFLEX MICROSCOPIC
Bilirubin Urine: NEGATIVE
Glucose, UA: NEGATIVE mg/dL
Hgb urine dipstick: NEGATIVE
Ketones, ur: NEGATIVE mg/dL
Leukocytes,Ua: NEGATIVE
Nitrite: NEGATIVE
Protein, ur: NEGATIVE mg/dL
Specific Gravity, Urine: 1.012 (ref 1.005–1.030)
pH: 7 (ref 5.0–8.0)

## 2023-05-13 LAB — MAGNESIUM: Magnesium: 2.1 mg/dL (ref 1.7–2.4)

## 2023-05-13 MED ORDER — CARBIDOPA-LEVODOPA ER 50-200 MG PO TBCR
1.0000 | EXTENDED_RELEASE_TABLET | Freq: Every day | ORAL | Status: DC
  Administered 2023-05-14 – 2023-05-21 (×8): 1 via ORAL
  Filled 2023-05-13 (×8): qty 1

## 2023-05-13 MED ORDER — APIXABAN 5 MG PO TABS
5.0000 mg | ORAL_TABLET | Freq: Two times a day (BID) | ORAL | Status: DC
  Administered 2023-05-13 – 2023-05-22 (×18): 5 mg via ORAL
  Filled 2023-05-13 (×18): qty 1

## 2023-05-13 MED ORDER — CARBIDOPA-LEVODOPA 25-100 MG PO TABS
2.0000 | ORAL_TABLET | Freq: Four times a day (QID) | ORAL | Status: DC
  Administered 2023-05-14 – 2023-05-22 (×34): 2 via ORAL
  Filled 2023-05-13 (×35): qty 2

## 2023-05-13 MED ORDER — CARBIDOPA-LEVODOPA 25-100 MG PO TABS
1.0000 | ORAL_TABLET | Freq: Once | ORAL | Status: AC
  Administered 2023-05-13: 1 via ORAL
  Filled 2023-05-13: qty 1

## 2023-05-13 NOTE — ED Provider Notes (Signed)
[facility] Provider Note    Event Date/Time   First MD Initiated Contact with Patient 05/13/23 [phone]     (approximate)   History   Chief Complaint Constipation   HPI  Scott Cortez is a 89 y.o. male with past medical history of hypertension, atrial fibrillation on Eliquis, CAD, CHF, AAA, Parkinson's disease, and dementia who presents to the ED for constipation.  Patient arrived via EMS, reportedly called due to constipation and not having a good bowel movement in the past 24 hours.  Patient then had a large BM during triage, at the time of my evaluation denies any issues with constipation, abdominal pain, nausea, or vomiting.  He does state that he is dealing with numbness and tingling in all 4 extremities, states this has been going on for multiple months.  At the time of my evaluation, patient is not sure what initially brought him to the hospital.  Speaking with patient's daughter over the phone, patient has been increasingly weak lately with difficulty walking.  He was previously in a rehab facility, currently lives at home by himself but has family and friends that come to help him out.     Physical Exam   Triage Vital Signs: ED Triage Vitals  Encounter Vitals Group     BP 05/12/23 2038 122/76     Systolic BP Percentile --      Diastolic BP Percentile --      Pulse Rate 05/12/23 2038 77     Resp 05/12/23 2038 16     Temp 05/12/23 2038 97.6 F (36.4 C)     Temp Source 05/12/23 2038 Oral     SpO2 05/12/23 2038 97 %     Weight 05/12/23 2039 145 lb 8.1 oz (66 kg)     Height 05/12/23 2039 5\' 10"  (1.778 m)     Head Circumference --      Peak Flow --      Pain Score 05/12/23 2039 0     Pain Loc --      Pain Education --      Exclude from Growth Chart --     Most recent vital signs: Vitals:   05/13/23 0214 05/13/23 0618  BP: 130/74 124/68  Pulse: 78 80  Resp: 18 18  Temp:  97.7 F (36.5 C)  SpO2: 98% 99%    Constitutional: Alert  and oriented. Eyes: Conjunctivae are normal. Head: Atraumatic. Nose: No congestion/rhinnorhea. Mouth/Throat: Mucous membranes are moist.  Cardiovascular: Normal rate, irregularly irregular rhythm. Grossly normal heart sounds.  2+ radial pulses bilaterally. Respiratory: Normal respiratory effort.  No retractions. Lungs CTAB. Gastrointestinal: Soft and nontender. No distention. Musculoskeletal: No lower extremity tenderness nor edema.  Neurologic:  Normal speech and language. No gross focal neurologic deficits are appreciated.    ED Results / Procedures / Treatments   Labs (all labs ordered are listed, but only abnormal results are displayed) Labs Reviewed  URINALYSIS, ROUTINE W REFLEX MICROSCOPIC - Abnormal; Notable for the following components:      Result Value   Color, Urine YELLOW (*)    APPearance CLEAR (*)    All other components within normal limits  COMPREHENSIVE METABOLIC PANEL - Abnormal; Notable for the following components:   Albumin 3.3 (*)    Total Bilirubin 1.6 (*)    All other components within normal limits  CBC WITH DIFFERENTIAL/PLATELET  MAGNESIUM     EKG  ED ECG REPORT I, Chesley Noon, the attending physician, personally viewed  and interpreted this ECG.   Date: 05/13/2023  EKG Time: 9:13  Rate: 86  Rhythm: atrial fibrillation, occasional PVC noted, unifocal  Axis: Normal  Intervals: Incomplete RBBB  ST&T Change: None  PROCEDURES:  Critical Care performed: No  Procedures   MEDICATIONS ORDERED IN ED: Medications  apixaban (ELIQUIS) tablet 5 mg (has no administration in time range)  carbidopa-levodopa (SINEMET CR) 50-200 MG per tablet controlled release 1 tablet (has no administration in time range)  carbidopa-levodopa (SINEMET IR) 25-100 MG per tablet immediate release 2 tablet (has no administration in time range)  carbidopa-levodopa (SINEMET IR) 25-100 MG per tablet immediate release 1 tablet (1 tablet Oral Given 05/13/23 1002)      IMPRESSION / MDM / ASSESSMENT AND PLAN / ED COURSE  I reviewed the triage vital signs and the nursing notes.                              89 y.o. male with past medical history of hypertension, CAD, CHF, AAA, atrial fibrillation on Eliquis, Parkinson's disease, and dementia who presents to the ED for constipation and numbness and tingling in his extremities.  Patient's presentation is most consistent with acute presentation with potential threat to life or bodily function.  Differential diagnosis includes, but is not limited to, stroke, TIA, UTI, anemia, electrolyte abnormality, AKI, arrhythmia.  Patient nontoxic-appearing and in no acute distress, vital signs are unremarkable.  He is alert and oriented to his baseline, has no focal neurologic deficits on exam.  He does appear generally weak, complains of numbness to all 4 extremities but this appears to be a chronic issue per daughter, who states that he deals with neuropathy.  We will screen EKG, labs, and urinalysis.  EKG shows no evidence of arrhythmia or ischemia, labs are reassuring with no significant anemia, leukocytosis, electrolyte abnormality, or AKI.  LFTs and magnesium level are also unremarkable, urinalysis with no signs of infection.  Patient unable to stand and walk here in the ED, given he lives alone we will have him evaluated by PT as well as social work for additional home health resources versus placement.      FINAL CLINICAL IMPRESSION(S) / ED DIAGNOSES   Final diagnoses:  Generalized weakness     Rx / DC Orders   ED Discharge Orders     None        Note:  This document was prepared using Dragon voice recognition software and may include unintentional dictation errors.   Chesley Noon, MD 05/13/23 [phone]

## 2023-05-13 NOTE — Evaluation (Signed)
Physical Therapy Evaluation Patient Details Name: Scott Cortez MRN: [id number] DOB: July 08, 1933 Today's Date: 05/13/2023  History of Present Illness  Pt is an 89 yo male that presented to ED for complaints of constipation but is unable to recall why he was at the ED later. Did recently have a stay at a rehab facility per family report, and has been increasingly weak at home. PMH of hypertension, atrial fibrillation on Eliquis, CAD, CHF, AAA, Parkinson's disease, and dementia.   Clinical Impression  Patient alert, oriented to self and did state he was in the hospital. Endorsed feeling confused about why he was here, and the date but new the New Year was coming up but could not state the year as 2024. He had many questions, pt educated and informed as able about his situation, difficult to keep on task. Pt also poor historian; referenced furniture walking, but also a WC, but then stated he does not walk at all, and cannot/does not get out of his house. Also referenced that his wife was going to give him a bath yesterday but chart review indicates he lives alone. The pt required 2 assist for all tasks. maxA supine <> sit (2nd assist especially for returning to supine safely). Able to sit EOB with BUE propped or holding on to RW. Sit <> stand maxAx2 with RW and pt needed assistance throughout standing due to posterior lean. Unable to truly ambulate due to stabilizing on gurney behind him, unable to weight shift and clear RLE for a step. Returned to supine with needs in reach.  Overall the patient demonstrated deficits (see "PT Problem List") that impede the patient's functional abilities, safety, and mobility and would benefit from skilled PT intervention.          If plan is discharge home, recommend the following: Two people to help with walking and/or transfers;Two people to help with bathing/dressing/bathroom;Direct supervision/assist for medications management;Help with stairs or ramp for  entrance;Assist for transportation;Assistance with feeding;Assistance with cooking/housework;Direct supervision/assist for financial management;Supervision due to cognitive status   Can travel by private vehicle   No    Equipment Recommendations Other (comment) (TBD)  Recommendations for Other Services       Functional Status Assessment Patient has had a recent decline in their functional status and demonstrates the ability to make significant improvements in function in a reasonable and predictable amount of time.     Precautions / Restrictions Precautions Precautions: Fall Restrictions Weight Bearing Restrictions Per Provider Order: No      Mobility  Bed Mobility Overal bed mobility: Needs Assistance Bed Mobility: Supine to Sit, Sit to Supine     Supine to sit: Max assist, +2 for safety/equipment, HOB elevated Sit to supine: Max assist, +2 for safety/equipment   General bed mobility comments: handheld assist provided and pt needed assistance with weight shift as well    Transfers Overall transfer level: Needs assistance Equipment used: Rolling walker (2 wheels) Transfers: Sit to/from Stand Sit to Stand: Min assist, +2 physical assistance, From elevated surface                Ambulation/Gait Ambulation/Gait assistance: Max assist, +2 physical assistance Gait Distance (Feet): 1 Feet Assistive device: Rolling walker (2 wheels)         General Gait Details: pt unable to truly ambulate due to strong posterior lean, stabilizing with calves on gurney. unable to lift RLE for weight shifting/stepping despite education, extra time  Stairs  Wheelchair Mobility     Tilt Bed    Modified Rankin (Stroke Patients Only)       Balance Overall balance assessment: Needs assistance Sitting-balance support: Feet supported Sitting balance-Leahy Scale: Poor     Standing balance support: Bilateral upper extremity supported Standing balance-Leahy  Scale: Poor                               Pertinent Vitals/Pain Pain Assessment Pain Assessment: Faces Faces Pain Scale: No hurt    Home Living Family/patient expects to be discharged to:: Private residence Living Arrangements: Alone Available Help at Discharge: Family;Friend(s);Available PRN/intermittently Type of Home: House Home Access: Stairs to enter Entrance Stairs-Rails: Right Entrance Stairs-Number of Steps: 5   Home Layout: One level        Prior Function Prior Level of Function : Patient poor historian/Family not available             Mobility Comments: pt stated he's been using a WC but also endorsed furniture walking, but  then stated he does not walk ADLs Comments: pt tangential and difficult to orient to task/questions, unable to obtain clear ADL discussion. stated his spouse tried to give him a bath yesterday     Extremity/Trunk Assessment   Upper Extremity Assessment Upper Extremity Assessment: Generalized weakness    Lower Extremity Assessment Lower Extremity Assessment: Generalized weakness (unable to lift BLE off of bed without assistance)       Communication      Cognition Arousal: Alert Behavior During Therapy: WFL for tasks assessed/performed Overall Cognitive Status: No family/caregiver present to determine baseline cognitive functioning                                 General Comments: pt needed reorientation to task, orientation to situation. difficult to re-direct. oriented to self and location but did endorse confusion over situation and date himself        General Comments      Exercises     Assessment/Plan    PT Assessment Patient needs continued PT services  PT Problem List Decreased strength;Decreased activity tolerance;Decreased range of motion;Decreased knowledge of use of DME;Decreased balance;Decreased mobility       PT Treatment Interventions Balance training;Gait training;DME  instruction;Neuromuscular re-education;Stair training;Functional mobility training;Patient/family education;Therapeutic activities;Therapeutic exercise    PT Goals (Current goals can be found in the Care Plan section)  Acute Rehab PT Goals Patient Stated Goal: to understand why he is here PT Goal Formulation: With patient Time For Goal Achievement: 05/27/23 Potential to Achieve Goals: Fair    Frequency Min 1X/week     Co-evaluation               AM-PAC PT "6 Clicks" Mobility  Outcome Measure Help needed turning from your back to your side while in a flat bed without using bedrails?: Total Help needed moving from lying on your back to sitting on the side of a flat bed without using bedrails?: Total Help needed moving to and from a bed to a chair (including a wheelchair)?: Total Help needed standing up from a chair using your arms (e.g., wheelchair or bedside chair)?: Total Help needed to walk in hospital room?: Total Help needed climbing 3-5 steps with a railing? : Total 6 Click Score: 6    End of Session   Activity Tolerance: Patient tolerated treatment well Patient left:  in bed (pt in hallway room, no alarm to reset, no call bell) Nurse Communication: Mobility status PT Visit Diagnosis: Other abnormalities of gait and mobility (R26.89);Difficulty in walking, not elsewhere classified (R26.2);Muscle weakness (generalized) (M62.81)    Time: 2952-8413 PT Time Calculation (min) (ACUTE ONLY): 22 min   Charges:   PT Evaluation $PT Eval Low Complexity: 1 Low PT Treatments $Therapeutic Activity: 8-22 mins PT General Charges $$ ACUTE PT VISIT: 1 Visit         Olga Coaster PT, DPT 3:33 PM,05/13/23

## 2023-05-13 NOTE — ED Notes (Signed)
Patient had urinated on self- changed by this RN and caitlyn, NT

## 2023-05-14 ENCOUNTER — Emergency Department: Payer: Medicare Other

## 2023-05-14 ENCOUNTER — Encounter: Payer: Self-pay | Admitting: Internal Medicine

## 2023-05-14 DIAGNOSIS — I2510 Atherosclerotic heart disease of native coronary artery without angina pectoris: Secondary | ICD-10-CM | POA: Diagnosis present

## 2023-05-14 DIAGNOSIS — R296 Repeated falls: Secondary | ICD-10-CM | POA: Diagnosis present

## 2023-05-14 DIAGNOSIS — N401 Enlarged prostate with lower urinary tract symptoms: Secondary | ICD-10-CM | POA: Diagnosis present

## 2023-05-14 DIAGNOSIS — B964 Proteus (mirabilis) (morganii) as the cause of diseases classified elsewhere: Secondary | ICD-10-CM | POA: Diagnosis present

## 2023-05-14 DIAGNOSIS — Z681 Body mass index (BMI) 19 or less, adult: Secondary | ICD-10-CM | POA: Diagnosis not present

## 2023-05-14 DIAGNOSIS — N390 Urinary tract infection, site not specified: Secondary | ICD-10-CM | POA: Diagnosis present

## 2023-05-14 DIAGNOSIS — E871 Hypo-osmolality and hyponatremia: Secondary | ICD-10-CM | POA: Diagnosis present

## 2023-05-14 DIAGNOSIS — R7989 Other specified abnormal findings of blood chemistry: Secondary | ICD-10-CM

## 2023-05-14 DIAGNOSIS — E785 Hyperlipidemia, unspecified: Secondary | ICD-10-CM | POA: Insufficient documentation

## 2023-05-14 DIAGNOSIS — K5641 Fecal impaction: Secondary | ICD-10-CM | POA: Diagnosis present

## 2023-05-14 DIAGNOSIS — G20A1 Parkinson's disease without dyskinesia, without mention of fluctuations: Secondary | ICD-10-CM | POA: Diagnosis present

## 2023-05-14 DIAGNOSIS — R64 Cachexia: Secondary | ICD-10-CM | POA: Diagnosis present

## 2023-05-14 DIAGNOSIS — F0283 Dementia in other diseases classified elsewhere, unspecified severity, with mood disturbance: Secondary | ICD-10-CM | POA: Diagnosis present

## 2023-05-14 DIAGNOSIS — I429 Cardiomyopathy, unspecified: Secondary | ICD-10-CM | POA: Diagnosis present

## 2023-05-14 DIAGNOSIS — N138 Other obstructive and reflux uropathy: Secondary | ICD-10-CM | POA: Diagnosis present

## 2023-05-14 DIAGNOSIS — E43 Unspecified severe protein-calorie malnutrition: Secondary | ICD-10-CM | POA: Diagnosis present

## 2023-05-14 DIAGNOSIS — G2581 Restless legs syndrome: Secondary | ICD-10-CM | POA: Diagnosis present

## 2023-05-14 DIAGNOSIS — I5032 Chronic diastolic (congestive) heart failure: Secondary | ICD-10-CM | POA: Diagnosis present

## 2023-05-14 DIAGNOSIS — I700 Atherosclerosis of aorta: Secondary | ICD-10-CM | POA: Diagnosis present

## 2023-05-14 DIAGNOSIS — Z1152 Encounter for screening for COVID-19: Secondary | ICD-10-CM | POA: Diagnosis not present

## 2023-05-14 DIAGNOSIS — G912 (Idiopathic) normal pressure hydrocephalus: Secondary | ICD-10-CM | POA: Diagnosis not present

## 2023-05-14 DIAGNOSIS — I110 Hypertensive heart disease with heart failure: Secondary | ICD-10-CM | POA: Diagnosis present

## 2023-05-14 DIAGNOSIS — Z66 Do not resuscitate: Secondary | ICD-10-CM | POA: Diagnosis present

## 2023-05-14 DIAGNOSIS — E872 Acidosis, unspecified: Secondary | ICD-10-CM | POA: Diagnosis present

## 2023-05-14 DIAGNOSIS — I959 Hypotension, unspecified: Secondary | ICD-10-CM | POA: Diagnosis present

## 2023-05-14 DIAGNOSIS — E7800 Pure hypercholesterolemia, unspecified: Secondary | ICD-10-CM | POA: Diagnosis present

## 2023-05-14 DIAGNOSIS — E861 Hypovolemia: Secondary | ICD-10-CM | POA: Diagnosis present

## 2023-05-14 DIAGNOSIS — R531 Weakness: Secondary | ICD-10-CM | POA: Diagnosis present

## 2023-05-14 DIAGNOSIS — F32A Depression, unspecified: Secondary | ICD-10-CM | POA: Insufficient documentation

## 2023-05-14 LAB — PROCALCITONIN: Procalcitonin: <0.1 ng/mL

## 2023-05-14 LAB — CBC
HCT: 40.8 % (ref 39.0–52.0)
Hemoglobin: 14 g/dL (ref 13.0–17.0)
MCH: 31.5 pg (ref 26.0–34.0)
MCHC: 34.3 g/dL (ref 30.0–36.0)
MCV: 91.7 fL (ref 80.0–100.0)
Platelets: 145 10*3/uL — ABNORMAL LOW (ref 150–400)
RBC: 4.45 MIL/uL (ref 4.22–5.81)
RDW: 13.4 % (ref 11.5–15.5)
WBC: 5.3 10*3/uL (ref 4.0–10.5)
nRBC: 0 % (ref 0.0–0.2)

## 2023-05-14 LAB — BASIC METABOLIC PANEL
Anion gap: 9 (ref 5–15)
BUN: 19 mg/dL (ref 8–23)
CO2: 26 mmol/L (ref 22–32)
Calcium: 8.9 mg/dL (ref 8.9–10.3)
Chloride: 98 mmol/L (ref 98–111)
Creatinine, Ser: 0.75 mg/dL (ref 0.61–1.24)
GFR, Estimated: >60 mL/min (ref >60)
Glucose, Bld: 126 mg/dL — ABNORMAL HIGH (ref 70–99)
Potassium: 4.1 mmol/L (ref 3.5–5.1)
Sodium: 133 mmol/L — ABNORMAL LOW (ref 135–145)

## 2023-05-14 LAB — SARS CORONAVIRUS 2 BY RT PCR: SARS Coronavirus 2 by RT PCR: NEGATIVE

## 2023-05-14 LAB — LACTIC ACID, PLASMA
Lactic Acid, Venous: 2 mmol/L (ref 0.5–1.9)
Lactic Acid, Venous: 1.2 mmol/L (ref 0.5–1.9)

## 2023-05-14 MED ORDER — ONDANSETRON HCL 4 MG PO TABS: 4.0000 mg | ORAL_TABLET | Freq: Four times a day (QID) | ORAL | Status: AC | PRN

## 2023-05-14 MED ORDER — TAMSULOSIN HCL 0.4 MG PO CAPS
0.4000 mg | ORAL_CAPSULE | Freq: Every day | ORAL | Status: DC
  Administered 2023-05-14 – 2023-05-21 (×8): 0.4 mg via ORAL
  Filled 2023-05-14 (×8): qty 1

## 2023-05-14 MED ORDER — ATORVASTATIN CALCIUM 20 MG PO TABS
20.0000 mg | ORAL_TABLET | Freq: Every day | ORAL | Status: DC
  Administered 2023-05-14 – 2023-05-21 (×8): 20 mg via ORAL
  Filled 2023-05-14 (×8): qty 1

## 2023-05-14 MED ORDER — ADULT MULTIVITAMIN W/MINERALS CH
1.0000 | ORAL_TABLET | Freq: Every day | ORAL | Status: DC
  Administered 2023-05-15 – 2023-05-22 (×8): 1 via ORAL
  Filled 2023-05-14 (×8): qty 1

## 2023-05-14 MED ORDER — SENNOSIDES-DOCUSATE SODIUM 8.6-50 MG PO TABS: 1.0000 | ORAL_TABLET | Freq: Every evening | ORAL | Status: DC | PRN

## 2023-05-14 MED ORDER — ENSURE ENLIVE PO LIQD
237.0000 mL | Freq: Three times a day (TID) | ORAL | Status: DC
  Administered 2023-05-14 – 2023-05-21 (×17): 237 mL via ORAL

## 2023-05-14 MED ORDER — THIAMINE HCL 100 MG PO TABS
100.0000 mg | ORAL_TABLET | Freq: Every day | ORAL | Status: DC
  Administered 2023-05-15 – 2023-05-22 (×8): 100 mg via ORAL
  Filled 2023-05-14 (×16): qty 1

## 2023-05-14 MED ORDER — ACETAMINOPHEN 325 MG PO TABS
650.0000 mg | ORAL_TABLET | Freq: Four times a day (QID) | ORAL | Status: AC | PRN
Start: 2023-05-14 — End: 2023-05-19
  Administered 2023-05-17 – 2023-05-19 (×3): 650 mg via ORAL
  Filled 2023-05-14 (×3): qty 2

## 2023-05-14 MED ORDER — POLYETHYLENE GLYCOL 3350 17 G PO PACK
17.0000 g | PACK | Freq: Every day | ORAL | Status: DC | PRN
  Filled 2023-05-14: qty 1

## 2023-05-14 MED ORDER — ONDANSETRON HCL 4 MG/2ML IJ SOLN: 4.0000 mg | Freq: Four times a day (QID) | INTRAMUSCULAR | Status: AC | PRN

## 2023-05-14 MED ORDER — ACETAMINOPHEN 650 MG RE SUPP: 650.0000 mg | Freq: Four times a day (QID) | RECTAL | Status: AC | PRN

## 2023-05-14 MED ORDER — HYDRALAZINE HCL 20 MG/ML IJ SOLN: 5.0000 mg | Freq: Four times a day (QID) | INTRAMUSCULAR | Status: AC | PRN

## 2023-05-14 MED ORDER — SODIUM CHLORIDE 0.9 % IV BOLUS
500.0000 mL | Freq: Once | INTRAVENOUS | Status: AC
  Administered 2023-05-14: 500 mL via INTRAVENOUS

## 2023-05-14 MED ORDER — METOPROLOL TARTRATE 5 MG/5ML IV SOLN: 5.0000 mg | INTRAVENOUS | Status: DC | PRN

## 2023-05-14 MED ORDER — ROPINIROLE HCL 0.25 MG PO TABS
0.2500 mg | ORAL_TABLET | Freq: Every day | ORAL | Status: DC
  Administered 2023-05-14 – 2023-05-21 (×8): 0.25 mg via ORAL
  Filled 2023-05-14 (×8): qty 1

## 2023-05-14 NOTE — Assessment & Plan Note (Signed)
 Home antihypertensive medication not resumed on admission due to patient having low blood pressure and low normal tensive blood pressure Hydralazine 5 mg IV every 6 hours as needed for SBP greater 165, 5 days ordered

## 2023-05-14 NOTE — ED Provider Notes (Signed)
 Emergency Medicine Observation Re-evaluation Note  Scott Cortez is a 89 y.o. male, currently boarding in the emergency department.  No acute events since last update. Physical Exam  BP 98/76   Pulse 70   Temp 97.9 F (36.6 C) (Oral)   Resp 17   Ht 5' 10 (1.778 m)   Wt 66 kg   SpO2 97%   BMI 20.88 kg/m   ED Course / MDM   Urinalysis is normal, CBC shows no concerning findings chemistry reassuring. Plan  Current plan is for placement to a safe living facility versus home health, social worker is currently working with the patient for safe disposition.Scott Dorothyann Drivers, MD 05/14/23 [phone]

## 2023-05-14 NOTE — Assessment & Plan Note (Addendum)
Mild at 2.0 We will recheck a second lactic acid on admission Check respiratory panel, 20 pathogen, COVID, procalcitonin, blood cultures x 2, UA, MRSA

## 2023-05-14 NOTE — Assessment & Plan Note (Signed)
Home atorvastatin 20 mg nightly resumed on admission

## 2023-05-14 NOTE — Assessment & Plan Note (Addendum)
 Etiology workup in progress Suspect secondary to hypovolemia due to poor PO intake in setting of depression Blood cultures x 2, urine culture are in process Check procalcitonin, MRSA by PCR, SARS check by PCR, respiratory 20 pathogen panel ordered on admission Repeat UA has been ordered by EDP and pending collection at this time Home antihypertensive medications not resumed on admission Goal MAP greater than 65%

## 2023-05-14 NOTE — Assessment & Plan Note (Signed)
-   Strict I's and O's

## 2023-05-14 NOTE — Hospital Course (Addendum)
 Hospital course / significant events:   HPI: [name] is an 89 year old male with history of atrial fibrillation on Eliquis , Parkinson's, hyperlipidemia, hypertension, dementia, who presents from home to the emergency department for chief concerns of weakness.  Hypotensive in ED, admitted to hospitalist service. Infectious workup so far negative.   Patient is debilitated, seen by PT advised SNF placement.  Placement pending    Consultants:  Behavioral Health   Procedures/Surgeries: none      ASSESSMENT & PLAN:   Hypotension Secondary to hypovolemia due to poor PO intake in setting of depression Infectious workup negative  Hold antihypertensive medications.  Goal MAP greater than 65.   Lactic acid blood increased Repeat one normal. No sepsis. Repeat as needed   UTI, proteus  UA 12/31, no abx started  Initiated ceftriaxone  1/3   Physical deconditioning Frequent falls Fall precaution. PT/ OT evaluation. TOC working on SNF placement    Constipation, fecal impaction Disimpacted 05/19/23 Continue bowel regimen   Atrial fibrillation  Eliquis  5 mg p.o. twice daily  Hold Coreg  due to low BP, rate has been at goal  Metoprolol  5 mg IV every 4 hours as needed for heart rate greater than 120, 3 doses ordered   Poor oral intake Registered dietitian consulted Encourage oral diet, supplements.   Parkinson disease Resumed Sinemet . Has baseline hand tremors. Need assistance with feeding.   BPH with obstruction/lower urinary tract symptoms Continue flomax .   Depression Behavioral health consult appreciated, no further work up nor meds recommended.   Restless leg syndrome Ropinirole  0.25 mg nightly resumed   Hyperlipidemia Continue atorvastatin  2  Borderline underweight based on BMI: Body mass index is 19.68 kg/m.  Significantly low or high BMI is associated with higher medical risk.  Weight management advised as adjunct to other disease management and  risk reduction treatments    DVT prophylaxis: eliquis  IV fluids: no continuous IV fluids  Nutrition: vegetarian diet  Central lines / invasive devices: none  Code Status: DNR ACP documentation reviewed: DNR and MOST on file in [facility]  TOC needs: SNF placement  Barriers to dispo / significant pending items: placement

## 2023-05-14 NOTE — ED Notes (Signed)
 Pt had episode of urine and bowel incontinence. Pt cleansed and clean brief and linens applied. Bed fall alarm on and other fall precautions initiated. Call bell within reach and active.

## 2023-05-14 NOTE — ED Notes (Signed)
Patient currently hypotensive and complaining of burning with urination. MD Quale notified via secure chat.

## 2023-05-14 NOTE — Assessment & Plan Note (Signed)
 Fall precaution.

## 2023-05-14 NOTE — Assessment & Plan Note (Addendum)
 Patient appears depressed with flat affect at bedside Discussed with daughter Behavioral health has been consulted for evaluation and initiation of antidepression medication

## 2023-05-14 NOTE — ED Notes (Signed)
Patient currently on the phone with his daughter Selena Batten.

## 2023-05-14 NOTE — Assessment & Plan Note (Signed)
-  Registered dietitian has been consulted 

## 2023-05-14 NOTE — ED Provider Notes (Signed)
 Patient's blood pressure is noted to be slightly hypotensive earlier in the shift now improving.  He is concerns of ongoing dysuria.  Repeat urinalysis, CBC metabolic panel and lactic acid have been ordered.  Dr. arlander to follow-up   Scott Anes, MD 05/14/23 [phone]

## 2023-05-14 NOTE — ED Notes (Signed)
Patient given a breakfast tray.

## 2023-05-14 NOTE — Assessment & Plan Note (Signed)
 Home Sinemet 25-100 mg, 2 tablets 4 times daily resumed per EDP Home Sinemet 50-200 mg nightly was resumed by EDP

## 2023-05-14 NOTE — Evaluation (Signed)
 Occupational Therapy Evaluation Patient Details Name: Scott Cortez MRN: [id number] DOB: 07/30/1933 Today's Date: 05/14/2023   History of Present Illness Pt is an 89 yo male that presented to ED for complaints of constipation but is unable to recall why he was at the ED later. Did recently have a stay at a rehab facility per family report, and has been increasingly weak at home. PMH of hypertension, atrial fibrillation on Eliquis , CAD, CHF, AAA, Parkinson's disease, and dementia.   Clinical Impression   Pt was seen for OT evaluation this date. Prior to hospital admission, pt was living at home alone with intermittent check ins from his girlfriend. Unable to provide much history, obtained from previous admissions. Typically lives alone and has intermittent assist from girlfriend for bathing. Seemingly bed bound for last several weeks, but then reported he walked to PT. Unclear what baseline is.  Pt presents to acute OT demonstrating impaired ADL performance and functional mobility 2/2 weakness, pain, low activity tolerance and balance deficits (See OT problem list for additional functional deficits). Pt currently requires Min A to roll to bil sides in bed for complete brief/linen change and peri-care d/t being soiled of urine on entry. Total assist for changing and peri-care. Mod A x2 for supine<>sit at EOB. BP soft 88/67 while seated EOB and needing Min/constant CGA to maintain seated balance, therefore standing deferred. Pt needing to take his shirt off d/t soiled in urine. MIN/CGA to perform and don new clean gown while seated EOB. Nurse notified of pt's reports of burning with urination. Pt would benefit from skilled OT services to address noted impairments and functional limitations (see below for any additional details) in order to maximize safety and independence while minimizing falls risk and caregiver burden. Do anticipate the need for follow up OT services upon acute hospital DC.         If plan is discharge home, recommend the following: Two people to help with walking and/or transfers;A lot of help with bathing/dressing/bathroom;Direct supervision/assist for medications management;Supervision due to cognitive status;Direct supervision/assist for financial management;Assistance with cooking/housework;Assist for transportation;Help with stairs or ramp for entrance    Functional Status Assessment  Patient has had a recent decline in their functional status and demonstrates the ability to make significant improvements in function in a reasonable and predictable amount of time.  Equipment Recommendations  Other (comment) (defer to next venue)    Recommendations for Other Services       Precautions / Restrictions Precautions Precautions: Fall Restrictions Weight Bearing Restrictions Per Provider Order: No      Mobility Bed Mobility Overal bed mobility: Needs Assistance Bed Mobility: Rolling, Supine to Sit, Sit to Supine Rolling: Min assist, Used rails   Supine to sit: Mod assist, +2 for physical assistance, HOB elevated, Used rails Sit to supine: Mod assist, +2 for safety/equipment   General bed mobility comments: Min A to roll to both sides in bed using rails and Mod A x2 for supine<>sit    Transfers                          Balance Overall balance assessment: Needs assistance Sitting-balance support: Feet supported, Bilateral upper extremity supported Sitting balance-Leahy Scale: Fair Sitting balance - Comments: able to sit static with Min/CGA on EOB  ADL either performed or assessed with clinical judgement   ADL Overall ADL's : Needs assistance/impaired                 Upper Body Dressing : Minimal assistance;Sitting           Toileting- Clothing Manipulation and Hygiene: Total assistance;Bed level               Vision         Perception         Praxis          Pertinent Vitals/Pain Pain Assessment Pain Assessment: Faces Faces Pain Scale: Hurts little more Pain Location: burning with urination Pain Descriptors / Indicators: Burning Pain Intervention(s): Monitored during session, Other (comment) (nurse notified)     Extremity/Trunk Assessment Upper Extremity Assessment Upper Extremity Assessment: Generalized weakness   Lower Extremity Assessment Lower Extremity Assessment: Generalized weakness       Communication Communication Cueing Techniques: Verbal cues   Cognition Arousal: Alert Behavior During Therapy: WFL for tasks assessed/performed Overall Cognitive Status: No family/caregiver present to determine baseline cognitive functioning                                 General Comments: pt confused, but seemingly better than yesterday; oriented to year and month; able to read the board to state he was in the hospital, but not initially oriented to situation     General Comments  soiled of urine in bed; reports burning with urination, nurse notified    Exercises Other Exercises Other Exercises: Edu in role of OT in acute setting and importance of therapy to maximize strength/IND.   Shoulder Instructions      Home Living Family/patient expects to be discharged to:: Private residence Living Arrangements: Alone Available Help at Discharge: Family;Friend(s);Available PRN/intermittently Type of Home: House Home Access: Stairs to enter [employer] of Steps: 5 Entrance Stairs-Rails: Right Home Layout: One level                          Prior Functioning/Environment Prior Level of Function : Patient poor historian/Family not available             Mobility Comments: pt stated he's been using a WC but also endorsed furniture walking, but  then stated he does not walk ADLs Comments: pt tangential and difficult to orient to task/questions, unable to obtain clear ADL discussion. stated his  spouse tried to give him a bath yesterday        OT Problem List: Decreased strength;Impaired balance (sitting and/or standing);Decreased activity tolerance;Decreased safety awareness;Decreased cognition      OT Treatment/Interventions: Self-care/ADL training;Therapeutic exercise;Therapeutic activities;DME and/or AE instruction;Patient/family education;Balance training    OT Goals(Current goals can be found in the care plan section) Acute Rehab OT Goals Patient Stated Goal: improve strength OT Goal Formulation: With patient Time For Goal Achievement: 05/28/23 Potential to Achieve Goals: Fair ADL Goals Pt Will Perform Grooming: with supervision;sitting Pt Will Perform Upper Body Bathing: with supervision;sitting Pt Will Perform Lower Body Bathing: with mod assist;sitting/lateral leans;sit to/from stand Pt Will Perform Lower Body Dressing: with mod assist;sitting/lateral leans;sit to/from stand Pt Will Transfer to Toilet: with mod assist;stand pivot transfer;bedside commode Additional ADL Goal #1: Pt will perform bed mobility with CGA, verb cues and good safety to promote IND/return to PLOF.  OT Frequency: Min 1X/week    Co-evaluation  AM-PAC OT 6 Clicks Daily Activity     Outcome Measure Help from another person eating meals?: None Help from another person taking care of personal grooming?: A Little Help from another person toileting, which includes using toliet, bedpan, or urinal?: A Lot Help from another person bathing (including washing, rinsing, drying)?: Total Help from another person to put on and taking off regular upper body clothing?: A Little Help from another person to put on and taking off regular lower body clothing?: A Lot 6 Click Score: 15   End of Session Nurse Communication: Mobility status  Activity Tolerance: Patient tolerated treatment well Patient left: in bed;with call bell/phone within reach;with bed alarm set  OT Visit Diagnosis:  Other abnormalities of gait and mobility (R26.89);Muscle weakness (generalized) (M62.81)                Time: 9061-9041 OT Time Calculation (min): 20 min Charges:  OT General Charges $OT Visit: 1 Visit OT Evaluation $OT Eval Moderate Complexity: 1 Mod Scott Cortez, OTR/L  05/14/23, 1:22 PM  Scott Cortez 05/14/2023, 1:18 PM

## 2023-05-14 NOTE — Assessment & Plan Note (Addendum)
Ropinirole 0.25 mg nightly resumed

## 2023-05-14 NOTE — TOC Initial Note (Signed)
 Transition of Care [facility]) - Initial/Assessment Note    Patient Details  Name: Scott Cortez MRN: [id number] Date of Birth: [birth year]  [facility] CM/SW Contact:    Scott LITTIE Stallion, LCSW Phone Number: 05/14/2023, 5:09 PM  Clinical Narrative:                  Csw met with patient at bedside. Patient is agreeable to skilled nursing facility. CSW spoke with patient's daughter Kimberley who lives out of town. She reports that he father should be inpatient because he has numerous medical conditions that should be looked at. She reports he can not go home alone. Patient's daughter reported that she has been unable to speak with a MD. Patient traditional medicare and that daughter reports she has already began the medicaid paperwork for her father. Patient eating and talking with CSW. CSW awaiting medicaid application approval for placement.       Patient Goals and CMS Choice            Expected Discharge Plan and Services                                              Prior Living Arrangements/Services                       Activities of Daily Living      Permission Sought/Granted                  Emotional Assessment              Admission diagnosis:  constipation Patient Active Problem List   Diagnosis Date Noted   Protein-calorie malnutrition, severe 03/13/2023   Frequent falls 03/11/2023   Urinary tract infection 03/11/2023   Acute metabolic encephalopathy 03/11/2023   NPH (normal pressure hydrocephalus) (HCC) 03/11/2023   Frailty syndrome in geriatric patient 03/11/2023   General weakness 01/18/2023   COVID-19 virus infection 01/18/2023   Hypokalemia 01/18/2023   Dizziness 03/27/2022   Parkinson disease (HCC) 03/27/2022   Fall 03/26/2022   Physical deconditioning 03/26/2022   CAD (coronary artery disease) 03/25/2022   Hypertension 03/25/2022   Falls 03/25/2022   Thrombocytopenia (HCC) 03/25/2022    Atrial fibrillation (HCC) 03/25/2022   Chronic diastolic (congestive) heart failure (HCC) 03/25/2022   Right groin pain 05/17/2021   Near syncope 03/07/2019   Nocturia 06/26/2016   Lower abdominal pain 09/04/2015   Erectile dysfunction 08/30/2014   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:   [facility] - [place], [place] - [address] [facility] [address] [place] [address] Phone: [phone] Fax: [phone]  [id number] [place], [place] - [address] AT [facility] OF [address] [place] [address] Phone: [phone] Fax: [phone]     Social Drivers of Health (SDOH) Social History: SDOH Screenings   Food Insecurity: No Food Insecurity (03/11/2023)  Housing: Patient Declined (03/11/2023)  Transportation Needs: Unmet Transportation Needs (03/11/2023)  Utilities: Not At Risk (03/11/2023)  Tobacco Use: Medium Risk (05/12/2023)   SDOH Interventions:     Readmission Risk Interventions     No data  to display

## 2023-05-14 NOTE — H&P (Addendum)
 History and Physical   Scott Cortez FMW:[id number] DOB: 11/12/1933 DOA: 05/13/2023  PCP: [facility]  Outpatient Specialists: Dr. Florencio Patient coming from: home  I have personally briefly reviewed patient's old medical records in [facility] EMR.  Chief Concern: Weakness, constipation  HPI: [name] is an 89 year old male with history of atrial fibrillation on Eliquis , Parkinson's, hyperlipidemia, hypertension, dementia, who presents emergency department for chief concerns of weakness.  Vitals in the ED showed temperature of 98, respiration rate of 16, heart rate of 80, blood pressure on day of admission dropped to 81/40 and improved to 126/82, SpO2 of 97% on room air.  Serum sodium is 133, potassium 4.1, chloride 98, bicarb 26, BUN of 19, serum creatinine of 0.75, EGFR greater than 60, nonfasting blood glucose 126, WBC 5.3, hemoglobin 14, platelets of 145.  Lactic acid is 2.0. UA was negative for leukocytes and nitrates.  EKG showed atrial fibrillation with rate of 86, QTc 471.  Portable chest x-ray has been ordered and pending radiology read at this time.  ED treatment: Sodium chloride  500 mL bolus. --------------------------- At bedside, patient able to tell me his first and last name.  He states that he is 89 years old and that the year is 2025.  He states that he is in the hospital.  He reports that he has been having weakness, difficulty walking for several years now however over the last week it has been worsening.  He states that he has had to balance himself around furniture to get around his home.  He denies trauma to his person, fever, chills, shortness of breath, chest pain, dysuria, hematuria, diarrhea.  He denies blood in his stool.  Daughter states patient has fallen at least 3 times in the last 4 months that daughter knows of.   Daughter states that over the last few days patient has been sleeping more and patient tells  daughter that he was not feeling well on Sunday. He was complaining of increased bowel movement.  Patient endorses burning with urination with RN per daughter.  Social history: He lives at home on his own.  He denies tobacco, EtOH, recreational drug use.  He is retired and formerly worked as a chiropractor.  ROS: Constitutional: no weight change, no fever ENT/Mouth: no sore throat, no rhinorrhea Eyes: no eye pain, no vision changes Cardiovascular: no chest pain, no dyspnea,  no edema, no palpitations Respiratory: no cough, no sputum, no wheezing Gastrointestinal: no nausea, no vomiting, no diarrhea, no constipation Genitourinary: no urinary incontinence, no dysuria, no hematuria Musculoskeletal: no arthralgias, no myalgias Skin: no skin lesions, no pruritus, Neuro: + weakness, no loss of consciousness, no syncope Psych: no anxiety, no depression, + decrease appetite Heme/Lymph: no bruising, no bleeding  ED Course: Discussed with EDP, patient requiring hospitalization for chief concerns of weakness, and hypotension with increased lactic acid.  Assessment/Plan  Principal Problem:   Hypotension Active Problems:   CAD (coronary artery disease)   General weakness   Frequent falls   Lactic acid blood increased   Hypertension   Atrial fibrillation (HCC)   Physical deconditioning   BPH with obstruction/lower urinary tract symptoms   Parkinson disease (HCC)   NPH (normal pressure hydrocephalus) (HCC)   Protein-calorie malnutrition, severe   Hyperlipidemia   Restless leg syndrome   Depression   Assessment and Plan:  * Hypotension Etiology workup in progress Suspect secondary to hypovolemia due to poor PO intake in setting of depression Blood cultures x 2,  urine culture are in process Check procalcitonin, MRSA by PCR, SARS check by PCR, respiratory 20 pathogen panel ordered on admission Repeat UA has been ordered by EDP and pending collection at this time Home antihypertensive  medications not resumed on admission Goal MAP greater than 65%  Lactic acid blood increased Mild at 2.0 We will recheck a second lactic acid on admission Check respiratory panel, 20 pathogen, COVID, procalcitonin, blood cultures x 2, UA, MRSA  Frequent falls Fall precaution  Physical deconditioning Fall precautions  Atrial fibrillation (HCC) Eliquis  5 mg p.o. twice daily resumed on admission Home Coreg  not resumed on admission as patient states he does not take it and patient is low normotensive at this time and had a decrease in blood pressure earlier in the morning requiring IV fluid Metoprolol  5 mg IV every 4 hours as needed for heart rate greater than 120, 3 doses ordered AM team to resume home Coreg  when the benefits outweigh the risk  Hypertension Home antihypertensive medication not resumed on admission due to patient having low blood pressure and low normal tensive blood pressure Hydralazine  5 mg IV every 6 hours as needed for SBP greater 165, 5 days ordered  Protein-calorie malnutrition, severe Registered dietitian has been consulted  Parkinson disease (HCC) Home Sinemet  25-100 mg, 2 tablets 4 times daily resumed per EDP Home Sinemet  50-200 mg nightly was resumed by EDP  BPH with obstruction/lower urinary tract symptoms Strict I's and O's  Depression Patient appears depressed with flat affect at bedside Discussed with daughter Behavioral health has been consulted for evaluation and initiation of antidepression medication  Restless leg syndrome Ropinirole  0.25 mg nightly resumed  Hyperlipidemia Home atorvastatin  20 mg nightly resumed on admission  Per daughter, patient had history of high cholesterol and since then he has become a pescatarian + with vegan tendencies and has avoided most dairy products except for ice cream. Registered dietitian has been consulted and made aware of the pescatarian with vegan tendencies and to avoid dairy except for ice cream. Diet  order with comments has been updated as appropriate.  Chart reviewed.   Complete echo on 03/08/2019: Left ventricular ejection fraction estimated at 60 to 65%, no left ventricular hypertrophy.  Normal left ventricular size.  Global right ventricle has normal systolic function.  The right ventricle size is mildly enlarged.  DVT prophylaxis: Apixaban  5 mg p.o. twice daily Code Status: DNR/DNI Diet: Heart healthy diet Family Communication: updated Suzen Fess, daughter over the phone at [phone] Disposition Plan: Pending clinical course Consults called: PT, OT, TOC via EDP; behavioral health has been consulted Admission status: Telemetry medical, inpatient  Past Medical History:  Diagnosis Date   AAA (abdominal aortic aneurysm) (HCC)    a.) CTA 12/31/2015: measured 3.1 cm. b.) CTA 03/07/2019: measured 3.5 cm. c.) CT abdomen 05/05/2021: measured  2.8 cm. d.) CTA 05/17/2021: measured 3.2 cm   Acute MI, inferior wall (HCC) 11/19/2003   a.) LHC 11/22/2003: EF 49%; 20% pRCA, 90% mRCA, 20% LM, 20% pLCx, 20% mLAD, 20% D1; unable to perform PCI --> transferred to [facility] and stent (unknown type) was placed to the RCA.   Aortic atherosclerosis (HCC)    Atrial fibrillation (HCC)    a.) CHA2DS2-VASc = 5 (age x 2, CHF, HTN, previous MI/aortic plaque). b.) rate/hythm maintained on oral carvedilol ; chronically anticoagulated using apixaban .   Bladder neck obstruction    BPH (benign prostatic hyperplasia)    Bradycardia    Cardiac murmur    Cardiomyopathy (HCC)  CHF (congestive heart failure) (HCC)    a.) TTE 01/05/2014: EF 50%; mild LA enlargement; triv AR//PR, mild MR/TR; G1DD. b.) TTE 03/08/2019: EF 60-65%; RV mildly enlarged. c.) TTE 09/26/2020: EF 30%; mild LVH; mild LA enlargement; mild MR/TR/PR.   Chronic pancreatitis (HCC)    Coronary artery disease    a.) inferior wall MI 11/19/2003; LHC 11/22/2003: EF 49%; 20% pRCA, 90% mRCA, 20% LM, 20% pLCx, 20% mLAD, 20% D1; unable to perform PCI  --> transferred to [facility] and stent (unknown type) was placed to the RCA. b.) MI 2007/2008 (date unknown); PCI placing a second stent (unknown type) to RCA. c.) LHC 02/02/2009: 50% RPLS; intervention deferred opting for med mgmt.   Current use of long term anticoagulation    a.) apixaban    Dental bridge present    upper - bilateral   Depression    Frequent falls    GERD (gastroesophageal reflux disease)    Glaucoma    Hyperlipidemia    Hypertension    Iliac artery aneurysm, left (HCC)    a.)  CTA 05/17/2021: measured 1.7 cm; previously 1.5 cm in 2015.   MI (myocardial infarction) (HCC) 05/02/2007   a.) inferoposterolateral MI 05/02/2007 - PCI placing a stent (unknown type) to the RCA   Neuropathy of both feet    Osteoarthritis    Parkinson's disease (HCC)    RLS (restless legs syndrome)    a.) takes ropinirole    Past Surgical History:  Procedure Laterality Date   CATARACT EXTRACTION W/PHACO Left 02/02/2019   Procedure: CATARACT EXTRACTION PHACO AND INTRAOCULAR LENS PLACEMENT (IOC) LEFT ISTENT INJ AND ISTENT 01:09.1        11.3%          8.47;  Surgeon: Myrna Adine Anes, MD;  Location: [facility];  Service: Ophthalmology;  Laterality: Left;   CATARACT EXTRACTION W/PHACO Right 02/23/2019   Procedure: CATARACT EXTRACTION PHACO AND INTRAOCULAR LENS PLACEMENT (IOC) RIGHT ISTENT INJ 01:09.1  14.1%  9.78;  Surgeon: Myrna Adine Anes, MD;  Location: [facility];  Service: Ophthalmology;  Laterality: Right;   COLONOSCOPY N/A 10/15/2014   Procedure: COLONOSCOPY;  Surgeon: Lamar ONEIDA Holmes, MD;  Location: [facility] ENDOSCOPY;  Service: Endoscopy;  Laterality: N/A;   CORONARY ANGIOPLASTY WITH STENT PLACEMENT Left 11/16/2003   PCI and stent placement to the RCA (unknown type)   CORONARY ANGIOPLASTY WITH STENT PLACEMENT Left 05/02/2007   PCI with stenting of the RCA (unknown type)   ESOPHAGOGASTRODUODENOSCOPY (EGD) WITH PROPOFOL  N/A 10/15/2014   Procedure: ESOPHAGOGASTRODUODENOSCOPY  (EGD) WITH PROPOFOL ;  Surgeon: Lamar ONEIDA Holmes, MD;  Location: [facility] ENDOSCOPY;  Service: Endoscopy;  Laterality: N/A;   INSERTION OF ANTERIOR SEGMENT AQUEOUS DRAINAGE DEVICE (ISTENT) Left 02/02/2019   Procedure: INSERTION OF ANTERIOR SEGMENT AQUEOUS DRAINAGE DEVICE (ISTENT);  Surgeon: Myrna Adine Anes, MD;  Location: [facility];  Service: Ophthalmology;  Laterality: Left;   LEFT HEART CATH AND CORONARY ANGIOGRAPHY Left 02/02/2009   Procedure: LEFT HEART CATH AND CORONARY ANGIOGRAPHY; Location: [facility]; Surgeon: Margie Lovelace, MD   SAVORY DILATION N/A 10/15/2014   Procedure: HARLEY DILATION;  Surgeon: Lamar ONEIDA Holmes, MD;  Location: [facility] ENDOSCOPY;  Service: Endoscopy;  Laterality: N/A;   XI ROBOTIC ASSISTED INGUINAL HERNIA REPAIR WITH MESH  07/27/2021   Procedure: XI ROBOTIC ASSISTED INGUINAL HERNIA REPAIR WITH MESH;  Surgeon: Tye Millet, DO;  Location: [facility] ORS;  Service: General;;   Social History:  reports that he quit smoking about 50 years ago. His smoking use included cigarettes. He has  never used smokeless tobacco. He reports that he does not currently use drugs after having used the following drugs: Marijuana. He reports that he does not drink alcohol.  Allergies  Allergen Reactions   Pneumococcal Vaccines Swelling   Family History  Problem Relation Age of Onset   Heart attack Mother    Family history: Family history reviewed and not pertinent.  Prior to Admission medications   Medication Sig Start Date End Date Taking? Authorizing Provider  acetaminophen  (TYLENOL ) 500 MG tablet Take 1,000 mg by mouth every 8 (eight) hours as needed for mild pain.   Yes [provider]  apixaban  (ELIQUIS ) 5 MG TABS tablet Take 1 tablet (5 mg total) by mouth 2 (two) times daily. 03/12/19  Yes Maree Hue, MD  atorvastatin  (LIPITOR) 20 MG tablet Take 20 mg by mouth daily.   Yes [provider]  carbidopa -levodopa  (SINEMET  CR) 50-200 MG tablet Take 1 tablet by mouth at  bedtime.   Yes [provider]  carbidopa -levodopa  (SINEMET  IR) 25-100 MG tablet Take 2 tablets by mouth 4 (four) times daily. At 0800, 1100, 1400, 1730   Yes [provider]  Multiple Vitamin (MULTIVITAMIN WITH MINERALS) TABS tablet Take 1 tablet by mouth daily. 03/31/22  Yes Akula, Vijaya, MD  polyethylene glycol (MIRALAX  / GLYCOLAX ) 17 g packet Take 17 g by mouth daily as needed for mild constipation.   Yes [provider]  rOPINIRole  (REQUIP ) 0.25 MG tablet Take 0.25 mg by mouth at bedtime.   Yes [provider]  tamsulosin  (FLOMAX ) 0.4 MG CAPS capsule Take 1 capsule (0.4 mg total) by mouth daily. Patient taking differently: Take 0.4 mg by mouth daily with supper. At 1700 09/12/20  Yes Stoioff, Glendia BROCKS, MD  thiamine  (VITAMIN B1) 100 MG tablet Take 1 tablet (100 mg total) by mouth daily. 03/16/23  Yes Caleen Qualia, MD  carvedilol  (COREG ) 6.25 MG tablet Hold until outpatient followup due to low blood pressure. Patient not taking: Reported on 05/14/2023 01/29/23   Awanda City, MD  cephALEXin  (KEFLEX ) 500 MG capsule Take 500 mg by mouth once. Patient not taking: Reported on 05/14/2023 03/19/23   [provider]  feeding supplement (ENSURE ENLIVE / ENSURE PLUS) LIQD Take 237 mLs by mouth 3 (three) times daily between meals. 03/30/22   Cherlyn Labella, MD   Physical Exam: Vitals:   05/14/23 0930 05/14/23 1030 05/14/23 1100 05/14/23 1356  BP: (!) 86/63 (!) 83/67 (!) 88/74 100/76  Pulse:    81  Resp:    17  Temp:      TempSrc:      SpO2:    97%  Weight:      Height:       Constitutional: appears cachectic , NAD, calm Eyes: PERRL, lids and conjunctivae normal ENMT: Mucous membranes are moist. Posterior pharynx clear of any exudate or lesions. Age-appropriate dentition. Hearing appropriate Neck: normal, supple, no masses, no thyromegaly Respiratory: clear to auscultation bilaterally, no wheezing, no crackles. Normal respiratory effort. No accessory muscle  use.  Cardiovascular: Regular rate and rhythm, no murmurs / rubs / gallops. No extremity edema. 2+ pedal pulses. No carotid bruits.  Abdomen: Scaphoid abdomen, no tenderness, no masses palpated, no hepatosplenomegaly. Bowel sounds positive.  Musculoskeletal: no clubbing / cyanosis. No joint deformity upper and lower extremities. Good ROM, no contractures. Bilateral lower extremity atrophy, decreased muscle tone.  Skin: no rashes, lesions, ulcers. No induration Neurologic: Sensation intact. Strength 5/5 in all 4.  Psychiatric: Normal judgment and insight. Alert  and oriented x 3. Depressed mood. Flat affect.  EKG: independently reviewed, showing atrial fibrillation with rate of 86, QTc 471  Chest x-ray on Admission: I personally reviewed and I agree with radiologist reading as below.  DG Chest Port 1 View Result Date: 05/14/2023 CLINICAL DATA:  Weakness EXAM: PORTABLE CHEST 1 VIEW COMPARISON:  01/18/2023 chest radiograph. FINDINGS: Stable cardiomediastinal silhouette with mild cardiomegaly. No pneumothorax. No pleural effusion. Lungs appear clear, with no acute consolidative airspace disease and no pulmonary edema. IMPRESSION: Mild cardiomegaly. No active pulmonary disease.  No pulmonary edema. Electronically Signed   By: Selinda DELENA Blue M.D.   On: 05/14/2023 18:50   Labs on Admission: I have personally reviewed following labs  CBC: Recent Labs  Lab 05/13/23 0924 05/14/23 1508  WBC 4.6 5.3  NEUTROABS 3.0  --   HGB 15.0 14.0  HCT 44.0 40.8  MCV 92.2 91.7  PLT 163 145*   Basic Metabolic Panel: Recent Labs  Lab 05/13/23 0924 05/14/23 1508  NA 135 133*  K 4.5 4.1  CL 100 98  CO2 28 26  GLUCOSE 92 126*  BUN 18 19  CREATININE 0.71 0.75  CALCIUM  8.9 8.9  MG 2.1  --    GFR: Estimated Creatinine Clearance: 60.7 mL/min (by C-G formula based on SCr of 0.75 mg/dL).  Liver Function Tests: Recent Labs  Lab 05/13/23 0924  AST 25  ALT 18  ALKPHOS 100  BILITOT 1.6*  PROT 6.6   ALBUMIN 3.3*   Urine analysis:    Component Value Date/Time   COLORURINE YELLOW (A) 05/13/2023 1246   APPEARANCEUR CLEAR (A) 05/13/2023 1246   APPEARANCEUR Clear 10/24/2011 2127   LABSPEC 1.012 05/13/2023 1246   LABSPEC 1.003 10/24/2011 2127   PHURINE 7.0 05/13/2023 1246   GLUCOSEU NEGATIVE 05/13/2023 1246   GLUCOSEU Negative 10/24/2011 2127   HGBUR NEGATIVE 05/13/2023 1246   BILIRUBINUR NEGATIVE 05/13/2023 1246   BILIRUBINUR Negative 10/24/2011 2127   KETONESUR NEGATIVE 05/13/2023 1246   PROTEINUR NEGATIVE 05/13/2023 1246   NITRITE NEGATIVE 05/13/2023 1246   LEUKOCYTESUR NEGATIVE 05/13/2023 1246   LEUKOCYTESUR Negative 10/24/2011 2127   This document was prepared using Dragon Voice Recognition software and may include unintentional dictation errors.  Dr. Sherre [facility]  If 7PM-7AM, please contact overnight-coverage provider If 7AM-7PM, please contact day attending provider [email or web address]  05/14/2023, 7:51 PM

## 2023-05-14 NOTE — Assessment & Plan Note (Signed)
 Fall precautions.

## 2023-05-14 NOTE — ED Provider Notes (Signed)
 Patient with elevated lactic acid of 2.0, highly suspicious of UTI given his dysuria, soft blood pressures, elevated lactic.  In the past he has grown Klebsiella in his urine sensitive to Rocephin , will start Rocephin  and admit to the hospitalist team   Arlander Charleston, MD 05/14/23 1711

## 2023-05-14 NOTE — Assessment & Plan Note (Addendum)
 Eliquis  5 mg p.o. twice daily resumed on admission Home Coreg  not resumed on admission as patient states he does not take it and patient is low normotensive at this time and had a decrease in blood pressure earlier in the morning requiring IV fluid Metoprolol  5 mg IV every 4 hours as needed for heart rate greater than 120, 3 doses ordered AM team to resume home Coreg  when the benefits outweigh the risk

## 2023-05-15 DIAGNOSIS — Z7689 Persons encountering health services in other specified circumstances: Secondary | ICD-10-CM

## 2023-05-15 DIAGNOSIS — I480 Paroxysmal atrial fibrillation: Secondary | ICD-10-CM

## 2023-05-15 DIAGNOSIS — R296 Repeated falls: Secondary | ICD-10-CM | POA: Diagnosis not present

## 2023-05-15 DIAGNOSIS — G912 (Idiopathic) normal pressure hydrocephalus: Secondary | ICD-10-CM | POA: Diagnosis not present

## 2023-05-15 DIAGNOSIS — R531 Weakness: Secondary | ICD-10-CM

## 2023-05-15 DIAGNOSIS — G2581 Restless legs syndrome: Secondary | ICD-10-CM

## 2023-05-15 DIAGNOSIS — E861 Hypovolemia: Secondary | ICD-10-CM | POA: Diagnosis not present

## 2023-05-15 DIAGNOSIS — G20A1 Parkinson's disease without dyskinesia, without mention of fluctuations: Secondary | ICD-10-CM

## 2023-05-15 LAB — URINALYSIS, ROUTINE W REFLEX MICROSCOPIC
Bilirubin Urine: NEGATIVE
Glucose, UA: NEGATIVE mg/dL
Ketones, ur: 5 mg/dL — AB
Nitrite: NEGATIVE
Protein, ur: 100 mg/dL — AB
Specific Gravity, Urine: 1.031 — ABNORMAL HIGH (ref 1.005–1.030)
WBC, UA: >50 WBC/hpf (ref 0–5)
pH: 6 (ref 5.0–8.0)

## 2023-05-15 LAB — BLOOD CULTURE ID PANEL (REFLEXED) - BCID2

## 2023-05-15 LAB — RESPIRATORY PANEL BY PCR

## 2023-05-15 LAB — CBC
HCT: 39.9 % (ref 39.0–52.0)
Hemoglobin: 13.6 g/dL (ref 13.0–17.0)
MCH: 30.8 pg (ref 26.0–34.0)
MCHC: 34.1 g/dL (ref 30.0–36.0)
MCV: 90.3 fL (ref 80.0–100.0)
Platelets: 146 10*3/uL — ABNORMAL LOW (ref 150–400)
RBC: 4.42 MIL/uL (ref 4.22–5.81)
RDW: 13.4 % (ref 11.5–15.5)
WBC: 5.7 10*3/uL (ref 4.0–10.5)
nRBC: 0 % (ref 0.0–0.2)

## 2023-05-15 LAB — BASIC METABOLIC PANEL
Anion gap: 8 (ref 5–15)
BUN: 17 mg/dL (ref 8–23)
CO2: 24 mmol/L (ref 22–32)
Calcium: 8.7 mg/dL — ABNORMAL LOW (ref 8.9–10.3)
Chloride: 101 mmol/L (ref 98–111)
Creatinine, Ser: 0.67 mg/dL (ref 0.61–1.24)
GFR, Estimated: >60 mL/min (ref >60)
Glucose, Bld: 85 mg/dL (ref 70–99)
Potassium: 3.8 mmol/L (ref 3.5–5.1)
Sodium: 133 mmol/L — ABNORMAL LOW (ref 135–145)

## 2023-05-15 LAB — LIPID PANEL
Cholesterol: 105 mg/dL (ref 0–200)
HDL: 38 mg/dL — ABNORMAL LOW (ref >40)
LDL Cholesterol: 59 mg/dL (ref 0–99)
Total CHOL/HDL Ratio: 2.8 {ratio}
Triglycerides: 39 mg/dL (ref <150)
VLDL: 8 mg/dL (ref 0–40)

## 2023-05-15 LAB — MRSA NEXT GEN BY PCR, NASAL: MRSA by PCR Next Gen: NOT DETECTED

## 2023-05-15 NOTE — Progress Notes (Signed)
 Initial Nutrition Assessment  DOCUMENTATION CODES:  Not applicable  INTERVENTION:  Adjust diet order to vegetarian to ensure pt's food preferences are honored.  Will add additional comments and likes to kitchen software. MVI with minerals daily Ensure Enlive po TID, each supplement provides 350 kcal and 20 grams of protein. Magic cup TID with meals, each supplement provides 290 kcal and 9 grams of protein   NUTRITION DIAGNOSIS:   (Inadequate energy intake) related to  (restricted diet) as evidenced by percent weight loss.  GOAL:  Patient will meet greater than or equal to 90% of their needs  MONITOR:  PO intake, Supplement acceptance, Weight trends  REASON FOR ASSESSMENT:  Consult Assessment of nutrition requirement/status (Pesc[place]ian + vegan diet only. No diary (milk, cheese). Ice cream is okay)  ASSESSMENT:  Pt with hx of CAD, CHF, HTN, HLD, GERD, AAA, atrial fibrillation, parkinson's, neuropathy, and dementia presented to ED with constipation and numbness/tingling in his extremities.  RD working remotely  Unable to reach pt on room phone at this time but noted that pt was evaluated by [facility] RD at a previous admission in October and diet preferences were discussed at that time.   Pt is vegetarian, but reported that he does consume dairy (drink ensure at home, has cereal and milk for breakfast daily) but does not eat any red meat or chicken. Does eat fish and occasionally eats eggs.   Weight continues to trend down from last admission. Weight loss is not severe but is concerning due to pt's age, restrictive diet, and BMI in undesirable range for age. Ensure already ordered by MD but added additional nutrition supplements to tray and added comments to dining services software.   Will follow-up in-person for repeat physical exam and to ensure food preferences are being met.  Noted, during last hospitalization micronutrient panel was obtained to evaluate for a possible source of  weakness and neuropathy. Levels resulted as normal   Reference Range 03/12/23  03/13/23   Vitamin B1  66.5 - 200.0 nmol/L 71.3   Vitamin D  30 - 100 ng/mL  46.59  Vitamin A  22.0 - 69.5 ug/dL  77.6  Vitamin B12 819 - 914 pg/mL  424  Zinc  44 - 115 ug/dL  45  Folate >4.0 ng/mL  16.7  (L): Data is abnormally low  Admit weight: 66 kg (? Accuracy) Current weight: 62.2 kg   Nutritionally Relevant Medications: Scheduled Meds:  atorvastatin   20 mg Oral QHS   carbidopa -levodopa   2 tablet Oral QID   Ensure Plus High Protein  237 mL Oral TID BM   multivitamin with minerals  1 tablet Oral Daily   thiamine   100 mg Oral Daily   PRN Meds: ondansetron , polyethylene glycol, senna-docusate  Labs Reviewed: Na 133  NUTRITION - FOCUSED PHYSICAL EXAM: Defer to in-person assessment. Pt diagnosed with severe malnutrition with severe loss of muscle and fat deficits during hospitalization 10/204  Diet Order:   Diet Order             Diet vegetarian Room service appropriate? Yes with Assist; Fluid consistency: Thin  Diet effective now                   EDUCATION NEEDS:  Education needs have been addressed  Skin:  Skin Assessment: Reviewed RN Assessment  Last BM:  unsure - noted BM reported in ED 12/29  Height:  Ht Readings from Last 1 Encounters:  05/14/23 5' 10 (1.778 m)    Weight:  Wt Readings  from Last 1 Encounters:  05/14/23 62.2 kg    Ideal Body Weight:  75.5 kg  BMI:  Body mass index is 19.68 kg/m.  Estimated Nutritional Needs:  Kcal:  1700-1900 kcal/d Protein:  85-100 Fluid:  2L/d    Vernell Lukes, RD, LDN Registered Dietitian II Please reach out via secure chat Weekend on-call pager # available in [facility]

## 2023-05-15 NOTE — Plan of Care (Signed)
  Problem: Pain Management: Goal: General experience of comfort will improve Outcome: Progressing   Problem: Safety: Goal: Ability to remain free from injury will improve Outcome: Progressing   Problem: Skin Integrity: Goal: Risk for impaired skin integrity will decrease Outcome: Progressing

## 2023-05-15 NOTE — Progress Notes (Signed)
 Progress Note   Patient: Scott Cortez FMW:[id number] DOB: 1933-12-17 DOA: 05/13/2023     1 DOS: the patient was seen and examined on 05/15/2023   Brief hospital course: [name] is an 89 year old male with history of atrial fibrillation on Eliquis , Parkinson's, hyperlipidemia, hypertension, dementia, who presents emergency department for chief concerns of weakness.  Vitals in the ED showed temperature of 98, respiration rate of 16, heart rate of 80, blood pressure on day of admission dropped to 81/40 and improved to 126/82, SpO2 of 97% on room air.  Serum sodium is 133, potassium 4.1, chloride 98, bicarb 26, BUN of 19, serum creatinine of 0.75, EGFR greater than 60, nonfasting blood glucose 126, WBC 5.3, hemoglobin 14, platelets of 145.  Lactic acid is 2.0. UA was negative for leukocytes and nitrates.  EKG showed atrial fibrillation with rate of 86, QTc 471.  Portable chest x-ray has been ordered and pending radiology read at this time.  ED treatment: Sodium chloride  500 mL bolus.  Assessment and Plan: * Hypotension Suspect secondary to hypovolemia due to poor PO intake in setting of depression Blood cultures x 2, urine culture are in process Procalcitonin negative, MRSA by PCR, SARS check by PCR, respiratory 20 pathogen panel ordered on admission Hold antihypertensive medications.  Goal MAP greater than 65%  Lactic acid blood increased Repeat one normal. No sepsis.  Physical deconditioning Frequent falls Fall precaution. PT/ OT evaluation. SNF placement  Atrial fibrillation (HCC) Eliquis  5 mg p.o. twice daily resumed on admission Hold Coreg  due to low BP. Metoprolol  5 mg IV every 4 hours as needed for heart rate greater than 120, 3 doses ordered  Poor oral intake Registered dietitian consulted Encourage oral diet, supplements.  Parkinson disease (HCC) Home Sinemet  25-100 mg, 2 tablets 4 times daily resumed. Home Sinemet  50-200 mg nightly was  resumed.  BPH with obstruction/lower urinary tract symptoms Strict I's and O's  Depression Behavioral health consult appreciated, no further work up nor meds recommended.  Restless leg syndrome Ropinirole  0.25 mg nightly resumed  Hyperlipidemia Continue atorvastatin  20 mg nightly resumed on admission     Out of bed to chair. Incentive spirometry. Nursing supportive care. Fall, aspiration precautions. DVT prophylaxis   Code Status: Limited: Do not attempt resuscitation (DNR) -DNR-LIMITED -Do Not Intubate/DNI  Subjective: Patient is seen and examined today morning, He is lying in bed, able to eat breakfast. Denies pain. Wishes to go SNF.  Physical Exam: Vitals:   05/14/23 2030 05/14/23 2348 05/15/23 0505 05/15/23 0829  BP: (!) 128/98 104/70 100/71 (!) 88/53  Pulse: 68 (!) 58 84 95  Resp: 19 18 16    Temp:  97.6 F (36.4 C) 97.9 F (36.6 C) (!) 97.5 F (36.4 C)  TempSrc:  Oral  Oral  SpO2: 100% 97% 99% 97%  Weight:  62.2 kg    Height:  5' 10 (1.778 m)      General - Elderly ill Caucasian male, no apparent distress HEENT - PERRLA, EOMI, atraumatic head, non tender sinuses. Lung - Clear, basal rales, diffuse rhonchi, no wheezes. Heart - S1, S2 heard, no murmurs, rubs, no pedal edema. Abdomen - Soft, non tender, bowel sounds good Neuro - Alert, awake and oriented, non focal exam. Skin - Warm and dry.  Data Reviewed:      Latest Ref Rng & Units 05/15/2023    4:40 AM 05/14/2023    3:08 PM 05/13/2023    9:24 AM  CBC  WBC 4.0 - 10.5 K/uL 5.7  5.3  4.6   Hemoglobin 13.0 - 17.0 g/dL 86.3  85.9  84.9   Hematocrit 39.0 - 52.0 % 39.9  40.8  44.0   Platelets 150 - 400 K/uL 146  145  163       Latest Ref Rng & Units 05/15/2023    4:40 AM 05/14/2023    3:08 PM 05/13/2023    9:24 AM  BMP  Glucose 70 - 99 mg/dL 85  873  92   BUN 8 - 23 mg/dL 17  19  18    Creatinine 0.61 - 1.24 mg/dL 9.32  9.24  9.28   Sodium 135 - 145 mmol/L 133  133  135   Potassium 3.5 - 5.1 mmol/L  3.8  4.1  4.5   Chloride 98 - 111 mmol/L 101  98  100   CO2 22 - 32 mmol/L 24  26  28    Calcium  8.9 - 10.3 mg/dL 8.7  8.9  8.9    DG Chest Port 1 View Result Date: 05/14/2023 CLINICAL DATA:  Weakness EXAM: PORTABLE CHEST 1 VIEW COMPARISON:  01/18/2023 chest radiograph. FINDINGS: Stable cardiomediastinal silhouette with mild cardiomegaly. No pneumothorax. No pleural effusion. Lungs appear clear, with no acute consolidative airspace disease and no pulmonary edema. IMPRESSION: Mild cardiomegaly. No active pulmonary disease.  No pulmonary edema. Electronically Signed   By: Selinda DELENA Blue M.D.   On: 05/14/2023 18:50     Family Communication: Discussed with patient, he understand and agree. All questions answereed.    Disposition: Status is: Inpatient Remains inpatient appropriate because: Debility, low BP  Planned Discharge Destination: Skilled nursing facility     Time spent: 36 minutes  Author: Concepcion Riser, MD 05/15/2023 5:42 PM Secure chat 7am to 7pm For on call review [email or web address].

## 2023-05-15 NOTE — Consult Note (Signed)
 [facility] Psychiatric Consult Initial  Patient Name: .Scott Cortez  MRN: [id number]  DOB: 1933-12-22  Consult Order details:  Orders (From admission, onward)     Start     Ordered   05/14/23 1941  IP CONSULT TO PSYCHIATRY       Ordering Provider: Sherre Greig SAILOR, DO  Provider:  (Not yet assigned)  Question Answer Comment  Location [facility]   Reason for Consult? depression, poor PO intake      05/14/23 1940             Mode of Visit: In person, I spent 45 min with this patient.    Psychiatry Consult Evaluation  Service Date: May 15, 2023 LOS:  LOS: 1 day  Chief Complaint   I feel great I just cannot think like I used to, I am 89 years old you know  Primary Psychiatric Diagnoses  Encounter for psychiatric evaluation  Assessment  Scott Cortez is a 89 y.o. male admitted: Medicallyfor 05/13/2023  7:41 AM for hypotension. He carries the psychiatric diagnoses of encounter for psychiatric evaluation and has a past medical history of  CAD, hypertension, atrial fibrillation, congestive heart failure, MI, Parkinson disease.SABRA   His current presentation is euthymic and is actively participating in interview. He meets criteria for encounter for a psychiatric evaluation based on no history of psychiatric problems with no needs or concerns at this time.  He does not have any current psychiatric medications . On initial examination, patient was awake and alert and enthusiastic of participating in interview. Please see plan below for detailed recommendations.   Diagnoses:  Active Hospital problems: Principal Problem:   Hypotension Active Problems:   BPH with obstruction/lower urinary tract symptoms   CAD (coronary artery disease)   Hypertension   Atrial fibrillation (HCC)   Physical deconditioning   Parkinson disease (HCC)   General weakness   Frequent falls   NPH (normal pressure hydrocephalus) (HCC)   Protein-calorie malnutrition,  severe   Hyperlipidemia   Lactic acid blood increased   Restless leg syndrome   Depression    Plan   ## Psychiatric Medication Recommendations:  No new medication recommendations.  ## Medical Decision Making Capacity: Not specifically addressed in this encounter  ## Further Work-up:  -- No further workup recommended  -- Pertinent labwork reviewed earlier this admission includes: CBC, CMP, glucose   ## Disposition:--There are no psychiatric concerns at this time the patient is cleared from psych with no recommendations.  ## Behavioral / Environmental: -Utilize compassion and acknowledge the patient's experiences while setting clear and realistic expectations for care.    Thank you for this consult request. Recommendations have been communicated to the primary team.  We will clear from psych consult, and to reach out to us  if any other concerns or needs arise for psychiatry at this time.   Dorn Jama Der, NP       History of Present Illness  Relevant Aspects of [facility] - Psychiatric Hospital Course:  Admitted on 05/13/2023 for hypotension. They are currently in hospital room actively awake and participating in interview.   Patient Report:  89 year old male is in a pleasant mood brought into the hospital from nursing facility stating that he has some medical issues which prompted admission to the hospital.  The patient started off by telling his previous life work and about his family stating that he has had a feeling life and is just having challenges and excepting the changes that are occurring to  his body in relation to Parkinson's and all of his other medical problems.  When asked about depression the patient denied stating that he has never been depressed and has never had to go to a psychiatric hospital, outpatient, or needed to see a therapist.  When asked about previous medications he states that he has never taken any psychiatric meds and does not think he needs any at this time.   When asked about diet he states that he is eating well, stating that the hospital food is great and eats what ever is given to him.  He also endorsed having shakes like Ensure which he enjoys receiving.  Outside of the hospital he states that he eats what he cans but generally has 3 meals a day.  The patient denies SI/HI/AVH/SIB.  And states he has no firearms in his home which were removed when he last went to the hospital several years ago.  He endorses getting good sleep with greater than 8 hours.  When asked if he has any concerns regarding anxiety or depression he denies and states that he has lived for feeling life and does not feel like he needs to be on any medications.  Psych ROS:  Depression: Denies Anxiety: Denies Mania (lifetime and current): Denies Psychosis: (lifetime and current): Denies  Review of Systems  Constitutional: Negative.   HENT: Negative.    Eyes: Negative.   Respiratory:  Positive for cough.   Cardiovascular: Negative.   Gastrointestinal: Negative.   Genitourinary: Negative.   Skin: Negative.   Psychiatric/Behavioral: Negative.       Psychiatric and Social History  Psychiatric History:  Information collected from patient.  Current Psych Provider: Denies Home Meds (current): No psychiatric meds. Previous Med Trials: Denies Therapy: Denies  Prior Psych Hospitalization: Denies Prior Self Harm: Denies Prior Violence: Denies  Family Psych History: Denies Family Hx suicide: Denies  Social History:  Developmental Hx: Denies Educational Hx: 14 years of schooling. Occupational Hx: Bricklayer & foreman retiring at age 50 Legal Hx: Denies Living Situation: Nursing facility Spiritual Hx: States he is specifically not spiritual Access to weapons/lethal means: Does not have access to weapons or guns  Substance History Alcohol: Denies  Tobacco: States smoked tobacco from ages [age] and has quit since. Illicit drugs: Marijuana use daily 5 years  ago.  Exam Findings   Vital Signs:  Temp:  [97.5 F (36.4 C)-97.9 F (36.6 C)] 97.5 F (36.4 C) (01/01 0829) Pulse Rate:  [58-95] 95 (01/01 0829) Resp:  [16-19] 16 (01/01 0505) BP: (83-128)/(53-98) 88/53 (01/01 0829) SpO2:  [96 %-100 %] 97 % (01/01 0829) Weight:  [62.2 kg] 62.2 kg (12/31 2348) Blood pressure (!) 88/53, pulse 95, temperature (!) 97.5 F (36.4 C), temperature source Oral, resp. rate 16, height 5' 10 (1.778 m), weight 62.2 kg, SpO2 97%. Body mass index is 19.68 kg/m.  Physical Exam Constitutional:      Appearance: Normal appearance.  HENT:     Head: Normocephalic.     Mouth/Throat:     Mouth: Mucous membranes are moist.     Pharynx: Oropharynx is clear.  Cardiovascular:     Rate and Rhythm: Normal rate.  Pulmonary:     Effort: Pulmonary effort is normal.  Skin:    General: Skin is warm and dry.  Neurological:     Mental Status: He is alert.    Psychiatric Specialty Exam: Physical Exam Constitutional:      Appearance: Normal appearance.  HENT:     Head: Normocephalic.  Mouth/Throat:     Mouth: Mucous membranes are moist.     Pharynx: Oropharynx is clear.  Cardiovascular:     Rate and Rhythm: Normal rate.  Pulmonary:     Effort: Pulmonary effort is normal.  Skin:    General: Skin is warm and dry.  Neurological:     Mental Status: He is alert.     Review of Systems  Constitutional: Negative.   HENT: Negative.    Eyes: Negative.   Respiratory:  Positive for cough.   Cardiovascular: Negative.   Gastrointestinal: Negative.   Genitourinary: Negative.   Skin: Negative.   Psychiatric/Behavioral: Negative.      Blood pressure (!) 88/53, pulse 95, temperature (!) 97.5 F (36.4 C), temperature source Oral, resp. rate 16, height 5' 10 (1.778 m), weight 62.2 kg, SpO2 97%.Body mass index is 19.68 kg/m.  General Appearance: Casual  Eye Contact:  Good  Speech:  Clear and Coherent  Volume:  Normal  Mood:  Euthymic  Affect:  Appropriate   Thought Process:  Coherent  Orientation:  Full (Time, Place, and Person)  Thought Content:  Logical  Suicidal Thoughts:  No  Homicidal Thoughts:  No  Memory:  Immediate;   Good Recent;   Good Remote;   Good  Judgement:  Good  Insight:  Good  Psychomotor Activity:  Normal  Concentration:  Concentration: Good and Attention Span: Good  Recall:  Good  Fund of Knowledge:  Good  Language:  Good  Akathisia:  No  Handed:  Right  AIMS (if indicated):     Assets:  Communication Skills Desire for Improvement Resilience Social Support  ADL's:  Impaired  Cognition:  WNL  Sleep:        Other History   These have been pulled in through the EMR, reviewed, and updated if appropriate.  Family History:  The patient's family history includes Heart attack in his mother.  Medical History: Past Medical History:  Diagnosis Date  . AAA (abdominal aortic aneurysm) (HCC)    a.) CTA 12/31/2015: measured 3.1 cm. b.) CTA 03/07/2019: measured 3.5 cm. c.) CT abdomen 05/05/2021: measured  2.8 cm. d.) CTA 05/17/2021: measured 3.2 cm  . Acute MI, inferior wall (HCC) 11/19/2003   a.) LHC 11/22/2003: EF 49%; 20% pRCA, 90% mRCA, 20% LM, 20% pLCx, 20% mLAD, 20% D1; unable to perform PCI --> transferred to [facility] and stent (unknown type) was placed to the RCA.  SABRA Aortic atherosclerosis (HCC)   . Atrial fibrillation (HCC)    a.) CHA2DS2-VASc = 5 (age x 2, CHF, HTN, previous MI/aortic plaque). b.) rate/hythm maintained on oral carvedilol ; chronically anticoagulated using apixaban .  . Bladder neck obstruction   . BPH (benign prostatic hyperplasia)   . Bradycardia   . Cardiac murmur   . Cardiomyopathy (HCC)   . CHF (congestive heart failure) (HCC)    a.) TTE 01/05/2014: EF 50%; mild LA enlargement; triv AR//PR, mild MR/TR; G1DD. b.) TTE 03/08/2019: EF 60-65%; RV mildly enlarged. c.) TTE 09/26/2020: EF 30%; mild LVH; mild LA enlargement; mild MR/TR/PR.  SABRA Chronic pancreatitis (HCC)   . Coronary artery disease     a.) inferior wall MI 11/19/2003; LHC 11/22/2003: EF 49%; 20% pRCA, 90% mRCA, 20% LM, 20% pLCx, 20% mLAD, 20% D1; unable to perform PCI --> transferred to [facility] and stent (unknown type) was placed to the RCA. b.) MI 2007/2008 (date unknown); PCI placing a second stent (unknown type) to RCA. c.) LHC 02/02/2009: 50% RPLS; intervention deferred opting for med mgmt.  SABRA  Current use of long term anticoagulation    a.) apixaban   . Dental bridge present    upper - bilateral  . Depression   . Frequent falls   . GERD (gastroesophageal reflux disease)   . Glaucoma   . Hyperlipidemia   . Hypertension   . Iliac artery aneurysm, left (HCC)    a.)  CTA 05/17/2021: measured 1.7 cm; previously 1.5 cm in 2015.  . MI (myocardial infarction) (HCC) 05/02/2007   a.) inferoposterolateral MI 05/02/2007 - PCI placing a stent (unknown type) to the RCA  . Neuropathy of both feet   . Osteoarthritis   . Parkinson's disease (HCC)   . RLS (restless legs syndrome)    a.) takes ropinirole     Surgical History: Past Surgical History:  Procedure Laterality Date  . CATARACT EXTRACTION W/PHACO Left 02/02/2019   Procedure: CATARACT EXTRACTION PHACO AND INTRAOCULAR LENS PLACEMENT (IOC) LEFT ISTENT INJ AND ISTENT 01:09.1        11.3%          8.47;  Surgeon: Myrna Adine Anes, MD;  Location: [facility];  Service: Ophthalmology;  Laterality: Left;  . CATARACT EXTRACTION W/PHACO Right 02/23/2019   Procedure: CATARACT EXTRACTION PHACO AND INTRAOCULAR LENS PLACEMENT (IOC) RIGHT ISTENT INJ 01:09.1  14.1%  9.78;  Surgeon: Myrna Adine Anes, MD;  Location: [facility];  Service: Ophthalmology;  Laterality: Right;  . COLONOSCOPY N/A 10/15/2014   Procedure: COLONOSCOPY;  Surgeon: Lamar ONEIDA Holmes, MD;  Location: [facility];  Service: Endoscopy;  Laterality: N/A;  . CORONARY ANGIOPLASTY WITH STENT PLACEMENT Left 11/16/2003   PCI and stent placement to the RCA (unknown type)  . CORONARY ANGIOPLASTY WITH STENT  PLACEMENT Left 05/02/2007   PCI with stenting of the RCA (unknown type)  . ESOPHAGOGASTRODUODENOSCOPY (EGD) WITH PROPOFOL  N/A 10/15/2014   Procedure: ESOPHAGOGASTRODUODENOSCOPY (EGD) WITH PROPOFOL ;  Surgeon: Lamar ONEIDA Holmes, MD;  Location: [facility];  Service: Endoscopy;  Laterality: N/A;  . INSERTION OF ANTERIOR SEGMENT AQUEOUS DRAINAGE DEVICE (ISTENT) Left 02/02/2019   Procedure: INSERTION OF ANTERIOR SEGMENT AQUEOUS DRAINAGE DEVICE (ISTENT);  Surgeon: Myrna Adine Anes, MD;  Location: [facility];  Service: Ophthalmology;  Laterality: Left;  . LEFT HEART CATH AND CORONARY ANGIOGRAPHY Left 02/02/2009   Procedure: LEFT HEART CATH AND CORONARY ANGIOGRAPHY; Location: [facility]; Surgeon: Margie Lovelace, MD  . HARLEY DILATION N/A 10/15/2014   Procedure: HARLEY DILATION;  Surgeon: Lamar ONEIDA Holmes, MD;  Location: [facility] ENDOSCOPY;  Service: Endoscopy;  Laterality: N/A;  . XI ROBOTIC ASSISTED INGUINAL HERNIA REPAIR WITH MESH  07/27/2021   Procedure: XI ROBOTIC ASSISTED INGUINAL HERNIA REPAIR WITH MESH;  Surgeon: Tye Millet, DO;  Location: [facility] ORS;  Service: General;;     Medications:   Current Facility-Administered Medications:  .  acetaminophen  (TYLENOL ) tablet 650 mg, 650 mg, Oral, Q6H PRN **OR** acetaminophen  (TYLENOL ) suppository 650 mg, 650 mg, Rectal, Q6H PRN, Cox, Amy N, DO .  apixaban  (ELIQUIS ) tablet 5 mg, 5 mg, Oral, BID, Cox, Amy N, DO, 5 mg at 05/14/23 2246 .  atorvastatin  (LIPITOR) tablet 20 mg, 20 mg, Oral, QHS, Cox, Amy N, DO, 20 mg at 05/14/23 2246 .  carbidopa -levodopa  (SINEMET  CR) 50-200 MG per tablet controlled release 1 tablet, 1 tablet, Oral, QHS, Cox, Amy N, DO, 1 tablet at 05/14/23 2319 .  carbidopa -levodopa  (SINEMET  IR) 25-100 MG per tablet immediate release 2 tablet, 2 tablet, Oral, QID, Cox, Amy N, DO, 2 tablet at 05/14/23 1633 .  feeding supplement (ENSURE ENLIVE / ENSURE PLUS)  liquid 237 mL, 237 mL, Oral, TID BM, Cox, Amy N, DO, 237 mL at 05/14/23 2006 .   hydrALAZINE  (APRESOLINE ) injection 5 mg, 5 mg, Intravenous, Q6H PRN, Cox, Amy N, DO .  metoprolol  tartrate (LOPRESSOR ) injection 5 mg, 5 mg, Intravenous, Q4H PRN, Cox, Amy N, DO .  multivitamin with minerals tablet 1 tablet, 1 tablet, Oral, Daily, Cox, Amy N, DO .  ondansetron  (ZOFRAN ) tablet 4 mg, 4 mg, Oral, Q6H PRN **OR** ondansetron  (ZOFRAN ) injection 4 mg, 4 mg, Intravenous, Q6H PRN, Cox, Amy N, DO .  polyethylene glycol (MIRALAX  / GLYCOLAX ) packet 17 g, 17 g, Oral, Daily PRN, Cox, Amy N, DO .  rOPINIRole  (REQUIP ) tablet 0.25 mg, 0.25 mg, Oral, QHS, Cox, Amy N, DO, 0.25 mg at 05/14/23 2319 .  senna-docusate (Senokot-S) tablet 1 tablet, 1 tablet, Oral, QHS PRN, Cox, Amy N, DO .  tamsulosin  (FLOMAX ) capsule 0.4 mg, 0.4 mg, Oral, Q supper, Cox, Amy N, DO, 0.4 mg at 05/14/23 1800 .  thiamine  (VITAMIN B1) tablet 100 mg, 100 mg, Oral, Daily, Cox, Amy N, DO  Allergies: Allergies  Allergen Reactions  . Pneumococcal Vaccines Swelling    Dorn Jama Der, NP

## 2023-05-15 NOTE — Progress Notes (Signed)
 PHARMACY - PHYSICIAN COMMUNICATION CRITICAL VALUE ALERT - BLOOD CULTURE IDENTIFICATION (BCID)  Results for orders placed or performed during the hospital encounter of 05/13/23  Blood culture (routine x 2)     Status: None (Preliminary result)   Collection Time: 05/14/23  5:40 PM   Specimen: BLOOD  Result Value Ref Range Status   Specimen Description BLOOD BLOOD RIGHT HAND  Final   Special Requests   Final    BOTTLES DRAWN AEROBIC AND ANAEROBIC Blood Culture results may not be optimal due to an inadequate volume of blood received in culture bottles   Culture  Setup Time   Final    GRAM POSITIVE COCCI AEROBIC BOTTLE ONLY Organism ID to follow CRITICAL RESULT CALLED TO, READ BACK BY AND VERIFIED WITH: Nikholas Geffre PHARMD @1903  05/15/23 ASW Performed at [facility], [address] [place], [place] [address]    Culture GRAM POSITIVE COCCI  Final   Report Status PENDING  Incomplete  SARS Coronavirus 2 by RT PCR (hospital order, performed in [facility] hospital lab) *cepheid single result test* Anterior Nasal Swab     Status: None   Collection Time: 05/14/23  5:40 PM   Specimen: Anterior Nasal Swab  Result Value Ref Range Status   SARS Coronavirus 2 by RT PCR NEGATIVE NEGATIVE Final    Comment: (NOTE) SARS-CoV-2 target nucleic acids are NOT DETECTED.  The SARS-CoV-2 RNA is generally detectable in upper and lower respiratory specimens during the acute phase of infection. The lowest concentration of SARS-CoV-2 viral copies this assay can detect is 250 copies / mL. A negative result does not preclude SARS-CoV-2 infection and should not be used as the sole basis for treatment or other patient management decisions.  A negative result may occur with improper specimen collection / handling, submission of specimen other than nasopharyngeal swab, presence of viral mutation(s) within the areas targeted by this assay, and inadequate number of viral copies (<250 copies / mL). A negative  result must be combined with clinical observations, patient history, and epidemiological information.  Fact Sheet for Patients:   roadlaptop.co.za  Fact Sheet for Healthcare Providers: http://kim-miller.com/  This test is not yet approved or  cleared by the [place]  FDA and has been authorized for detection and/or diagnosis of SARS-CoV-2 by FDA under an Emergency Use Authorization (EUA).  This EUA will remain in effect (meaning this test can be used) for the duration of the COVID-19 declaration under Section 564(b)(1) of the Act, 21 U.S.C. section 360bbb-3(b)(1), unless the authorization is terminated or revoked sooner.  Performed at [facility], [address] [place], [place] [address]   Blood Culture ID Panel (Reflexed)     Status: Abnormal   Collection Time: 05/14/23  5:40 PM  Result Value Ref Range Status   Enterococcus faecalis NOT DETECTED NOT DETECTED Final   Enterococcus Faecium NOT DETECTED NOT DETECTED Final   Listeria monocytogenes NOT DETECTED NOT DETECTED Final   Staphylococcus species DETECTED (A) NOT DETECTED Final    Comment: CRITICAL RESULT CALLED TO, READ BACK BY AND VERIFIED WITH: Donye Campanelli PHARMD @1903  05/15/23 ASW    Staphylococcus aureus (BCID) NOT DETECTED NOT DETECTED Final   Staphylococcus epidermidis DETECTED (A) NOT DETECTED Final    Comment: CRITICAL RESULT CALLED TO, READ BACK BY AND VERIFIED WITH: Demitris Pokorny PHARMD @1903  05/15/23 ASW    Staphylococcus lugdunensis NOT DETECTED NOT DETECTED Final   Streptococcus species NOT DETECTED NOT DETECTED Final   Streptococcus agalactiae NOT DETECTED NOT DETECTED  Final   Streptococcus pneumoniae NOT DETECTED NOT DETECTED Final   Streptococcus pyogenes NOT DETECTED NOT DETECTED Final   A.calcoaceticus-baumannii NOT DETECTED NOT DETECTED Final   Bacteroides fragilis NOT DETECTED NOT DETECTED Final   Enterobacterales NOT DETECTED NOT DETECTED Final    Enterobacter cloacae complex NOT DETECTED NOT DETECTED Final   Escherichia coli NOT DETECTED NOT DETECTED Final   Klebsiella aerogenes NOT DETECTED NOT DETECTED Final   Klebsiella oxytoca NOT DETECTED NOT DETECTED Final   Klebsiella pneumoniae NOT DETECTED NOT DETECTED Final   Proteus species NOT DETECTED NOT DETECTED Final   Salmonella species NOT DETECTED NOT DETECTED Final   Serratia marcescens NOT DETECTED NOT DETECTED Final   Haemophilus influenzae NOT DETECTED NOT DETECTED Final   Neisseria meningitidis NOT DETECTED NOT DETECTED Final   Pseudomonas aeruginosa NOT DETECTED NOT DETECTED Final   Stenotrophomonas maltophilia NOT DETECTED NOT DETECTED Final   Candida albicans NOT DETECTED NOT DETECTED Final   Candida auris NOT DETECTED NOT DETECTED Final   Candida glabrata NOT DETECTED NOT DETECTED Final   Candida krusei NOT DETECTED NOT DETECTED Final   Candida parapsilosis NOT DETECTED NOT DETECTED Final   Candida tropicalis NOT DETECTED NOT DETECTED Final   Cryptococcus neoformans/gattii NOT DETECTED NOT DETECTED Final   Methicillin resistance mecA/C NOT DETECTED NOT DETECTED Final    Comment: Performed at [facility], [address] [place], [place] [address]  Blood culture (routine x 2)     Status: None (Preliminary result)   Collection Time: 05/14/23  5:45 PM   Specimen: BLOOD  Result Value Ref Range Status   Specimen Description BLOOD BLOOD LEFT HAND  Final   Special Requests   Final    BOTTLES DRAWN AEROBIC AND ANAEROBIC Blood Culture results may not be optimal due to an inadequate volume of blood received in culture bottles   Culture   Final    NO GROWTH < 12 HOURS Performed at [facility], [address] [place], [place] [address]    Report Status PENDING  Incomplete  MRSA Next Gen by PCR, Nasal     Status: None   Collection Time: 05/15/23  1:53 AM   Specimen: Nasal Mucosa; Nasal Swab  Result Value Ref Range Status   MRSA by PCR Next Gen NOT  DETECTED NOT DETECTED Final    Comment: (NOTE) The GeneXpert MRSA Assay (FDA approved for NASAL specimens only), is one component of a comprehensive MRSA colonization surveillance program. It is not intended to diagnose MRSA infection nor to guide or monitor treatment for MRSA infections. Test performance is not FDA approved in patients less than 76 years old. Performed at [facility], [address] [place], [place] [address]   Respiratory (~20 pathogens) panel by PCR     Status: None   Collection Time: 05/15/23  1:53 AM   Specimen: Nasal Mucosa  Result Value Ref Range Status   Adenovirus NOT DETECTED NOT DETECTED Final   Coronavirus 229E NOT DETECTED NOT DETECTED Final    Comment: (NOTE) The Coronavirus on the Respiratory Panel, DOES NOT test for the novel  Coronavirus (2019 nCoV)    Coronavirus HKU1 NOT DETECTED NOT DETECTED Final   Coronavirus NL63 NOT DETECTED NOT DETECTED Final   Coronavirus OC43 NOT DETECTED NOT DETECTED Final   Metapneumovirus NOT DETECTED NOT DETECTED Final   Rhinovirus / Enterovirus NOT DETECTED NOT DETECTED Final   Influenza A NOT DETECTED NOT DETECTED Final   Influenza B NOT DETECTED NOT DETECTED  Final   Parainfluenza Virus 1 NOT DETECTED NOT DETECTED Final   Parainfluenza Virus 2 NOT DETECTED NOT DETECTED Final   Parainfluenza Virus 3 NOT DETECTED NOT DETECTED Final   Parainfluenza Virus 4 NOT DETECTED NOT DETECTED Final   Respiratory Syncytial Virus NOT DETECTED NOT DETECTED Final   Bordetella pertussis NOT DETECTED NOT DETECTED Final   Bordetella Parapertussis NOT DETECTED NOT DETECTED Final   Chlamydophila pneumoniae NOT DETECTED NOT DETECTED Final   Mycoplasma pneumoniae NOT DETECTED NOT DETECTED Final    Comment: Performed at [facility], [address] [place], [place] [address]    BCID Results: 1 (aerobic bottle) of 4 with Staph Epi, no resistance.  Pt not currently on any abx.  Pt remains afebrile, WBC still WNL.  Pt  admitted 12/30 for hypotension.  Name of provider contacted: WENDI Cone, NP   Changes to prescribed antibiotics required: No recommended changes at this time, pending addition cx results.  Rankin CANDIE Dills, PharmD, [facility] 05/15/2023 7:45 PM

## 2023-05-15 NOTE — Plan of Care (Signed)

## 2023-05-16 DIAGNOSIS — E861 Hypovolemia: Secondary | ICD-10-CM | POA: Diagnosis not present

## 2023-05-16 DIAGNOSIS — R296 Repeated falls: Secondary | ICD-10-CM | POA: Diagnosis not present

## 2023-05-16 DIAGNOSIS — G912 (Idiopathic) normal pressure hydrocephalus: Secondary | ICD-10-CM | POA: Diagnosis not present

## 2023-05-16 DIAGNOSIS — R531 Weakness: Secondary | ICD-10-CM | POA: Diagnosis not present

## 2023-05-16 NOTE — Plan of Care (Signed)

## 2023-05-16 NOTE — TOC Initial Note (Addendum)
 Transition of Care [facility]) - Initial/Assessment Note    Patient Details  Name: Scott Cortez MRN: [id number] Date of Birth: Oct 05, 1933  [facility] CM/SW Contact:    Corean ONEIDA Haddock, RN Phone Number: 05/16/2023, 4:05 PM  Clinical Narrative:                  Attempted to discuss SNF recs with patient. Patient defers for me to speak with daughter Suzen Suzen feels that patient needs to be placed long term, and states that she has submitted a medicaid application.   I have notified her that therapy is recommending SNF, and I anticipated that patient will be in his copays days.   Bed search started for STR, then transition to LTC with Medicaid pending.  Suzen is in agreement  Kimberley states that she has been in communication with his APS case worker Charmaine Budge [phone].  Tried to call  Charmaine to obtain additional information and update, but her voicemail box was full        Patient Goals and CMS Choice            Expected Discharge Plan and Services                                              Prior Living Arrangements/Services                       Activities of Daily Living   ADL Screening (condition at time of admission) Independently performs ADLs?: No Does the patient have a NEW difficulty with bathing/dressing/toileting/self-feeding that is expected to last >3 days?: Yes (Initiates electronic notice to provider for possible OT consult) Does the patient have a NEW difficulty with getting in/out of bed, walking, or climbing stairs that is expected to last >3 days?: Yes (Initiates electronic notice to provider for possible PT consult) Does the patient have a NEW difficulty with communication that is expected to last >3 days?: Yes (Initiates electronic notice to provider for possible SLP consult) Is the patient deaf or have difficulty hearing?: No Does the patient have difficulty seeing, even when wearing  glasses/contacts?: No Does the patient have difficulty concentrating, remembering, or making decisions?: Yes  Permission Sought/Granted                  Emotional Assessment              Admission diagnosis:  Generalized weakness [R53.1] Hypotension [I95.9] Sepsis, due to unspecified organism, unspecified whether acute organ dysfunction present [facility]) [A41.9] Patient Active Problem List   Diagnosis Date Noted   Encounter for psychiatric assessment 05/15/2023   Hypotension 05/14/2023   Hyperlipidemia 05/14/2023   Lactic acid blood increased 05/14/2023   Restless leg syndrome 05/14/2023   Depression 05/14/2023   Protein-calorie malnutrition, severe 03/13/2023   Frequent falls 03/11/2023   Urinary tract infection 03/11/2023   Acute metabolic encephalopathy 03/11/2023   NPH (normal pressure hydrocephalus) (HCC) 03/11/2023   Frailty syndrome in geriatric patient 03/11/2023   General weakness 01/18/2023   COVID-19 virus infection 01/18/2023   Hypokalemia 01/18/2023   Dizziness 03/27/2022   Parkinson disease (HCC) 03/27/2022   Fall 03/26/2022   Physical deconditioning 03/26/2022   CAD (coronary artery disease) 03/25/2022   Hypertension 03/25/2022   Falls 03/25/2022   Thrombocytopenia (HCC) 03/25/2022   Atrial fibrillation (  HCC) 03/25/2022   Chronic diastolic (congestive) heart failure (HCC) 03/25/2022   Right groin pain 05/17/2021   Near syncope 03/07/2019   Nocturia 06/26/2016   Lower abdominal pain 09/04/2015   Erectile dysfunction 08/30/2014   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:   [facility], [place] - [address] [place] [address] Phone: [phone] Fax: [phone]  [id number] [place], [place]  - [address] AT [facility] [address] [place] [address] Phone: [phone] Fax: [phone]     Social Drivers of Health (SDOH) Social History: SDOH Screenings   Food Insecurity: No Food Insecurity (05/15/2023)  Housing: Low Risk  (05/15/2023)  Transportation Needs: Unmet Transportation Needs (05/15/2023)  Utilities: Not At Risk (05/15/2023)  Social Connections: Socially Isolated (05/15/2023)  Tobacco Use: Medium Risk (05/14/2023)   SDOH Interventions:     Readmission Risk Interventions    05/16/2023    4:05 PM  Readmission Risk Prevention Plan  Transportation Screening Complete  Home Care Screening Complete  Medication Review (RN CM) Complete

## 2023-05-16 NOTE — Progress Notes (Signed)
 Mobility Specialist - Progress Note   05/16/23 1100  Mobility  Activity Ambulated with assistance in room;Transferred from chair to bed  Level of Assistance Moderate assist, patient does 50-74%  Assistive Device Front wheel walker  Distance Ambulated (ft) 3 ft  Activity Response Tolerated well  Mobility visit 1 Mobility     Pt sitting in recliner upon arrival, utilizing RA. Pt requesting assistance to return to bed. Pt very fearful of falling. Cues for hip-shifting forward to prepare for standing + hand placement. STS and step-pivot with modA +2. Post lean in standing. VC for sequencing steps. Pt left in bed with alarm set, needs in reach.    Lennette Seip Mobility Specialist 05/16/23, 12:08 PM

## 2023-05-16 NOTE — Progress Notes (Signed)
 Progress Note   Patient: Scott Cortez FMW:[id number] DOB: 1933/11/25 DOA: 05/13/2023     2 DOS: the patient was seen and examined on 05/16/2023   Brief hospital course: [name] is an 89 year old male with history of atrial fibrillation on Eliquis , Parkinson's, hyperlipidemia, hypertension, dementia, who presents emergency department for chief concerns of weakness.  Patient is admitted to the hospital for further evaluation of hypotension.  Infectious workup so far negative.  Patient is debilitated, seen by PT advised SNF placement.  Assessment and Plan: * Hypotension Secondary to hypovolemia due to poor PO intake in setting of depression Blood cultures x 2, urine culture so far negative. Procalcitonin negative, MRSA by PCR, SARS check by PCR, respiratory 20 pathogen panel negative. Hold antihypertensive medications.  Goal MAP greater than 65.  Lactic acid blood increased Repeat one normal. No sepsis.  Physical deconditioning Frequent falls Fall precaution. PT/ OT evaluation. TOC working on SNF placement   Atrial fibrillation (HCC) Eliquis  5 mg p.o. twice daily resumed on admission Hold Coreg  due to low BP. Metoprolol  5 mg IV every 4 hours as needed for heart rate greater than 120, 3 doses ordered  Poor oral intake Registered dietitian consulted Encourage oral diet, supplements.  Parkinson disease (HCC) Resumed Sinemet . Has baseline hand tremors. Need assistance with feeding.  BPH with obstruction/lower urinary tract symptoms Continue flomax .  Depression Behavioral health consult appreciated, no further work up nor meds recommended.  Restless leg syndrome Ropinirole  0.25 mg nightly resumed  Hyperlipidemia Continue atorvastatin  20 mg nightly.  Poor oral intake, at risk of malnutrition. Dietician evaluation. Encourage diet.     Out of bed to chair. Incentive spirometry. Nursing supportive care. Fall, aspiration precautions. DVT  prophylaxis   Code Status: Limited: Do not attempt resuscitation (DNR) -DNR-LIMITED -Do Not Intubate/DNI   Subjective: Patient is seen and examined today morning, He is sitting in chair, trouble eating breakfast due to tremors. Encouraged to work with PT. Eating fair.  Physical Exam: Vitals:   05/15/23 2123 05/16/23 0510 05/16/23 0850 05/16/23 1525  BP: 99/76 92/78 98/62  (!) 86/57  Pulse: 82 91 73 87  Resp: 19 18 16 18   Temp: (!) 97.4 F (36.3 C) 97.9 F (36.6 C) 98 F (36.7 C) 98.4 F (36.9 C)  TempSrc:  Oral Oral   SpO2: 96% 98% 92% 97%  Weight:      Height:        General - Elderly ill Caucasian male, sitting, no apparent distress HEENT - PERRLA, EOMI, atraumatic head, non tender sinuses. Lung - Clear, basal rales, diffuse rhonchi, no wheezes. Heart - S1, S2 heard, no murmurs, rubs, no pedal edema. Abdomen - Soft, non tender, bowel sounds good Neuro - Alert, awake and oriented, non focal exam. Skin - Warm and dry. Hand tremors.  Data Reviewed:      Latest Ref Rng & Units 05/15/2023    4:40 AM 05/14/2023    3:08 PM 05/13/2023    9:24 AM  CBC  WBC 4.0 - 10.5 K/uL 5.7  5.3  4.6   Hemoglobin 13.0 - 17.0 g/dL 86.3  85.9  84.9   Hematocrit 39.0 - 52.0 % 39.9  40.8  44.0   Platelets 150 - 400 K/uL 146  145  163       Latest Ref Rng & Units 05/15/2023    4:40 AM 05/14/2023    3:08 PM 05/13/2023    9:24 AM  BMP  Glucose 70 - 99 mg/dL 85  873  92   BUN 8 - 23 mg/dL 17  19  18    Creatinine 0.61 - 1.24 mg/dL 9.32  9.24  9.28   Sodium 135 - 145 mmol/L 133  133  135   Potassium 3.5 - 5.1 mmol/L 3.8  4.1  4.5   Chloride 98 - 111 mmol/L 101  98  100   CO2 22 - 32 mmol/L 24  26  28    Calcium  8.9 - 10.3 mg/dL 8.7  8.9  8.9    No results found.  Family Communication: Discussed with patient, he understand and agree. All questions answereed.  Disposition: Status is: Inpatient Remains inpatient appropriate because: Debility, monitor BP, SNF placement  Planned Discharge  Destination: Skilled nursing facility     Time spent: 37 minutes  Author: Concepcion Riser, MD 05/16/2023 7:51 PM Secure chat 7am to 7pm For on call review [email or web address].

## 2023-05-16 NOTE — Progress Notes (Signed)
 Physical Therapy Treatment Patient Details Name: Scott Cortez MRN: [id number] DOB: 07/15/1933 Today's Date: 05/16/2023   History of Present Illness Pt is an 89 yo male that presented to ED for complaints of constipation but is unable to recall why he was at the ED later. Did recently have a stay at a rehab facility per family report, and has been increasingly weak at home. PMH of hypertension, atrial fibrillation on Eliquis , CAD, CHF, AAA, Parkinson's disease, and dementia.    PT Comments  Pt was long sitting in bed with RN tech at bedside. He agrees to session and remains cooperative and pleasantly confused throughout. Pt puts forth great effort but is limited mostly by his fear of falling. Pt was able to exit R side of bed, stand with assistance, and then take ~ 5 steps away from EOB prior to recliner being placed behind him. Pt tolerates session well but is very fearful. He will continue to benefit from skilled PT at DC to maximize his independence and safety with all ADLs.     If plan is discharge home, recommend the following: A little help with walking and/or transfers;A little help with bathing/dressing/bathroom;Assistance with cooking/housework;Direct supervision/assist for medications management;Direct supervision/assist for financial management;Assist for transportation;Help with stairs or ramp for entrance;Supervision due to cognitive status     Equipment Recommendations  Other (comment) (Defer to next level of care)       Precautions / Restrictions Precautions Precautions: Fall Restrictions Weight Bearing Restrictions Per Provider Order: No     Mobility  Bed Mobility Overal bed mobility: Needs Assistance Bed Mobility: Rolling, Supine to Sit, Sit to Supine Rolling: Contact guard assist, Used rails Supine to sit: Min assist   Transfers Overall transfer level: Needs assistance Equipment used: Rolling walker (2 wheels) Transfers: Sit to/from Stand Sit to Stand: Min  assist, From elevated surface   Ambulation/Gait Ambulation/Gait assistance: Min assist, Mod assist Gait Distance (Feet): 5 Feet Assistive device: Rolling walker (2 wheels) Gait Pattern/deviations: Step-to pattern  General Gait Details: pt did take ~ 5 steps from EOB to recliner. Pt is fearful of falling however was able to progress BLE without difficulty. fear of falkling most limiting    Balance Overall balance assessment: Needs assistance Sitting-balance support: Feet supported, Bilateral upper extremity supported Sitting balance-Leahy Scale: Good     Standing balance support: Bilateral upper extremity supported Standing balance-Leahy Scale: Fair Standing balance comment: reliant on RW for all dynamic standing activity      Cognition Arousal: Alert Behavior During Therapy: WFL for tasks assessed/performed Overall Cognitive Status: No family/caregiver present to determine baseline cognitive functioning    General Comments: pt is alert + pleasantly confused. Agreeable and cooperative throughout               Pertinent Vitals/Pain Pain Assessment Pain Assessment: No/denies pain     PT Goals (current goals can now be found in the care plan section) Acute Rehab PT Goals Patient Stated Goal: none stated Progress towards PT goals: Progressing toward goals    Frequency    Min 1X/week       AM-PAC PT 6 Clicks Mobility   Outcome Measure  Help needed turning from your back to your side while in a flat bed without using bedrails?: A Lot Help needed moving from lying on your back to sitting on the side of a flat bed without using bedrails?: A Lot Help needed moving to and from a bed to a chair (including a wheelchair)?: A Lot Help  needed standing up from a chair using your arms (e.g., wheelchair or bedside chair)?: A Lot Help needed to walk in hospital room?: A Lot Help needed climbing 3-5 steps with a railing? : A Lot 6 Click Score: 12    End of Session    Activity Tolerance: Patient tolerated treatment well Patient left: in chair;with call bell/phone within reach;with chair alarm set Nurse Communication: Mobility status PT Visit Diagnosis: Other abnormalities of gait and mobility (R26.89);Difficulty in walking, not elsewhere classified (R26.2);Muscle weakness (generalized) (M62.81)     Time: 9175-9149 PT Time Calculation (min) (ACUTE ONLY): 26 min  Charges:    $Therapeutic Activity: 23-37 mins PT General Charges $$ ACUTE PT VISIT: 1 Visit                     Rankin Essex PTA 05/16/23, 2:38 PM

## 2023-05-16 NOTE — [place] FL2 (Signed)
 [place]  MEDICAID FL2 LEVEL OF CARE FORM     IDENTIFICATION  Patient Name: Scott Cortez Birthdate: [birth year] Sex: male Admission Date (Current Location): 05/13/2023  [facility] [place] Number:  Chiropodist and Address:         Provider Number: [id number]  Attending Physician Name and Address:  Darci Pore, MD  Relative Name and Phone Number:       Current Level of Care: Hospital Recommended Level of Care: Skilled Nursing Facility Prior Approval Number:    Date Approved/Denied:   PASRR Number: [id number]  Discharge Plan: SNF    Current Diagnoses: Patient Active Problem List   Diagnosis Date Noted   Encounter for psychiatric assessment 05/15/2023   Hypotension 05/14/2023   Hyperlipidemia 05/14/2023   Lactic acid blood increased 05/14/2023   Restless leg syndrome 05/14/2023   Depression 05/14/2023   Protein-calorie malnutrition, severe 03/13/2023   Frequent falls 03/11/2023   Urinary tract infection 03/11/2023   Acute metabolic encephalopathy 03/11/2023   NPH (normal pressure hydrocephalus) (HCC) 03/11/2023   Frailty syndrome in geriatric patient 03/11/2023   General weakness 01/18/2023   COVID-19 virus infection 01/18/2023   Hypokalemia 01/18/2023   Dizziness 03/27/2022   Parkinson disease (HCC) 03/27/2022   Fall 03/26/2022   Physical deconditioning 03/26/2022   CAD (coronary artery disease) 03/25/2022   Hypertension 03/25/2022   Falls 03/25/2022   Thrombocytopenia (HCC) 03/25/2022   Atrial fibrillation (HCC) 03/25/2022   Chronic diastolic (congestive) heart failure (HCC) 03/25/2022   Right groin pain 05/17/2021   Near syncope 03/07/2019   Nocturia 06/26/2016   Lower abdominal pain 09/04/2015   Erectile dysfunction 08/30/2014   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   Incomplete emptying of bladder 01/26/2014   Increased frequency of urination 01/26/2014   Slowing of urinary stream 01/26/2014     Orientation RESPIRATION BLADDER Height & Weight     Self, Situation, Place  Normal Incontinent Weight: 62.2 kg Height:  5' 10 (177.8 cm)  BEHAVIORAL SYMPTOMS/MOOD NEUROLOGICAL BOWEL NUTRITION STATUS      Continent Diet (vegetarian)  AMBULATORY STATUS COMMUNICATION OF NEEDS Skin   Extensive Assist Verbally Normal                       Personal Care Assistance Level of Assistance              Functional Limitations Info             SPECIAL CARE FACTORS FREQUENCY  PT (By licensed PT), OT (By licensed OT)                    Contractures Contractures Info: Not present    Additional Factors Info  Code Status, Allergies Code Status Info: DNR Allergies Info: Pneumococcal vaccine           Current Medications (05/16/2023):  This is the current hospital active medication list Current Facility-Administered Medications  Medication Dose Route Frequency Provider Last Rate Last Admin   acetaminophen  (TYLENOL ) tablet 650 mg  650 mg Oral Q6H PRN Cox, Amy N, DO       Or   acetaminophen  (TYLENOL ) suppository 650 mg  650 mg Rectal Q6H PRN Cox, Amy N, DO       apixaban  (ELIQUIS ) tablet 5 mg  5 mg Oral BID Cox, Amy N, DO   5 mg at 05/16/23 9083   atorvastatin  (LIPITOR) tablet 20 mg  20 mg Oral QHS Cox, Amy N, DO  20 mg at 05/15/23 2123   carbidopa -levodopa  (SINEMET  CR) 50-200 MG per tablet controlled release 1 tablet  1 tablet Oral QHS Cox, Amy N, DO   1 tablet at 05/15/23 2216   carbidopa -levodopa  (SINEMET  IR) 25-100 MG per tablet immediate release 2 tablet  2 tablet Oral QID Cox, Amy N, DO   2 tablet at 05/16/23 1446   feeding supplement (ENSURE ENLIVE / ENSURE PLUS) liquid 237 mL  237 mL Oral TID BM Cox, Amy N, DO   237 mL at 05/16/23 1447   hydrALAZINE  (APRESOLINE ) injection 5 mg  5 mg Intravenous Q6H PRN Cox, Amy N, DO       metoprolol  tartrate (LOPRESSOR ) injection 5 mg  5 mg Intravenous Q4H PRN Cox, Amy N, DO       multivitamin with minerals tablet 1 tablet  1  tablet Oral Daily Cox, Amy N, DO   1 tablet at 05/16/23 0911   ondansetron  (ZOFRAN ) tablet 4 mg  4 mg Oral Q6H PRN Cox, Amy N, DO       Or   ondansetron  (ZOFRAN ) injection 4 mg  4 mg Intravenous Q6H PRN Cox, Amy N, DO       polyethylene glycol (MIRALAX  / GLYCOLAX ) packet 17 g  17 g Oral Daily PRN Cox, Amy N, DO       rOPINIRole  (REQUIP ) tablet 0.25 mg  0.25 mg Oral QHS Cox, Amy N, DO   0.25 mg at 05/15/23 2123   senna-docusate (Senokot-S) tablet 1 tablet  1 tablet Oral QHS PRN Cox, Amy N, DO       tamsulosin  (FLOMAX ) capsule 0.4 mg  0.4 mg Oral Q supper Cox, Amy N, DO   0.4 mg at 05/15/23 1800   thiamine  (VITAMIN B1) tablet 100 mg  100 mg Oral Daily Cox, Amy N, DO   100 mg at 05/16/23 9083     Discharge Medications: Please see discharge summary for a list of discharge medications.  Relevant Imaging Results:  Relevant Lab Results:   Additional Information SS#: [id number].  Corean ONEIDA Haddock, RN

## 2023-05-17 DIAGNOSIS — E861 Hypovolemia: Secondary | ICD-10-CM | POA: Diagnosis not present

## 2023-05-17 LAB — URINE CULTURE: Culture: >=100000 — AB

## 2023-05-17 LAB — CULTURE, BLOOD (ROUTINE X 2)

## 2023-05-17 MED ORDER — SENNOSIDES-DOCUSATE SODIUM 8.6-50 MG PO TABS
2.0000 | ORAL_TABLET | Freq: Every day | ORAL | Status: AC
  Administered 2023-05-17: 2 via ORAL
  Filled 2023-05-17: qty 2

## 2023-05-17 MED ORDER — HYDROCORTISONE (PERIANAL) 2.5 % EX CREA
1.0000 | TOPICAL_CREAM | Freq: Four times a day (QID) | CUTANEOUS | Status: DC | PRN
  Administered 2023-05-17: 1 via TOPICAL
  Filled 2023-05-17: qty 28.35

## 2023-05-17 MED ORDER — POLYETHYLENE GLYCOL 3350 17 G PO PACK
17.0000 g | PACK | Freq: Every day | ORAL | Status: DC
Start: 2023-05-17 — End: 2023-05-20
  Administered 2023-05-17 – 2023-05-20 (×4): 17 g via ORAL
  Filled 2023-05-17 (×4): qty 1

## 2023-05-17 MED ORDER — SODIUM CHLORIDE 0.9 % IV SOLN
1.0000 g | INTRAVENOUS | Status: DC
  Administered 2023-05-17 – 2023-05-19 (×3): 1 g via INTRAVENOUS
  Filled 2023-05-17 (×3): qty 10

## 2023-05-17 NOTE — Progress Notes (Signed)
 Mobility Specialist - Progress Note   05/17/23 1200  Mobility  Activity Ambulated with assistance in room;Transferred from bed to chair  Level of Assistance Minimal assist, patient does 75% or more  Assistive Device Front wheel walker  Distance Ambulated (ft) 5 ft  Activity Response Tolerated well  Mobility visit 1 Mobility     Pt lying in bed upon arrival, utilizing RA. Pt agreeable to activity. Pt a little emotional as he discusses his rectum pain; active listening and support provided. Pt completed bed mobility with minA. STS and ambulation with minA. RW negotiation needed for retro-stepping. VC for sequence. Pt left in chair with alarm set, needs in reach.    Lennette Seip Mobility Specialist 05/17/23, 12:26 PM

## 2023-05-17 NOTE — Plan of Care (Signed)
   Problem: Nutrition: Goal: Adequate nutrition will be maintained Outcome: Progressing   Problem: Safety: Goal: Ability to remain free from injury will improve Outcome: Progressing   Problem: Skin Integrity: Goal: Risk for impaired skin integrity will decrease Outcome: Progressing

## 2023-05-17 NOTE — Care Management Important Message (Signed)
 Important Message  Patient Details  Name: Scott Cortez MRN: [id number] Date of Birth: 14-Jan-1934   Important Message Given:  Yes - Medicare IM     Sherilyn Banker 05/17/2023, 12:14 PM

## 2023-05-17 NOTE — Progress Notes (Signed)
 Nutrition Follow-up  DOCUMENTATION CODES:   Severe malnutrition in context of chronic illness  INTERVENTION:   -Continue vegetarian diet -Continue MVI with minerals daily -Continue Ensure Enlive po TID, each supplement provides 350 kcal and 20 grams of protein -Continue Magic cup TID with meals, each supplement provides 290 kcal and 9 grams of protein  -Feeding assistance with meals  NUTRITION DIAGNOSIS:   Severe Malnutrition related to chronic illness (Parkinson's) as evidenced by moderate fat depletion, severe fat depletion, moderate muscle depletion, severe muscle depletion.  Ongoing  GOAL:   Patient will meet greater than or equal to 90% of their needs  Progressing   MONITOR:   PO intake, Supplement acceptance  REASON FOR ASSESSMENT:   Consult Assessment of nutrition requirement/status ([place] + vegan diet only. No diary (milk, cheese). Ice cream is okay)  ASSESSMENT:   Pt with hx of CAD, CHF, HTN, HLD, GERD, AAA, atrial fibrillation, parkinson's, neuropathy, and dementia presented to ED with constipation and numbness/tingling in his extremities.  Reviewed I/O's: -700 ml x 24 hours and -580 ml x 24 hours  Pt lying in recliner chair at visit. He was sleeping soundly and did not awaken to voice or touch.  Noted meal tray just delivered and untouched. Pt is drinking Ensure supplements.   Pt is vegetarian, but reported that he does consume dairy (drink ensure at home, has cereal and milk for breakfast daily) but does not eat any red meat or chicken. Does eat fish and occasionally eats eggs.   Medications reviewed and include sinemet  and thiamine .   Per TOC notes, plan for SNF placement at discharge.   Labs reviewed: Na: 133, CBGS: 95 (inpatient orders for glycemic control are none).    NUTRITION - FOCUSED PHYSICAL EXAM:  Flowsheet Row Most Recent Value  Orbital Region Severe depletion  Upper Arm Region Moderate depletion  Thoracic and Lumbar Region  Severe depletion  Buccal Region Severe depletion  Temple Region Severe depletion  Clavicle Bone Region Severe depletion  Clavicle and Acromion Bone Region Severe depletion  Scapular Bone Region Severe depletion  Dorsal Hand Severe depletion  Patellar Region Moderate depletion  Anterior Thigh Region Moderate depletion  Posterior Calf Region Moderate depletion  Edema (RD Assessment) Mild  Hair Reviewed  Eyes Reviewed  Mouth Reviewed  Skin Reviewed  Nails Reviewed       Diet Order:   Diet Order             Diet vegetarian Room service appropriate? Yes with Assist; Fluid consistency: Thin  Diet effective now                   EDUCATION NEEDS:   No education needs have been identified at this time  Skin:  Skin Assessment: Reviewed RN Assessment  Last BM:  05/16/22 (type 5)  Height:   Ht Readings from Last 1 Encounters:  05/14/23 5' 10 (1.778 m)    Weight:   Wt Readings from Last 1 Encounters:  05/14/23 62.2 kg    Ideal Body Weight:  75.5 kg  BMI:  Body mass index is 19.68 kg/m.  Estimated Nutritional Needs:   Kcal:  1700-1900  Protein:  85-100 grams  Fluid:  > 1.7 L    Margery ORN, RD, LDN, CDCES Registered Dietitian III Certified Diabetes Care and Education Specialist If unable to reach this RD, please use RD Inpatient group chat on secure chat between hours of 8am-4 pm daily

## 2023-05-17 NOTE — Progress Notes (Signed)
 Occupational Therapy Treatment Patient Details Name: Scott Cortez MRN: [id number] DOB: [birth year] Today's Date: 05/17/2023   History of present illness Pt is an 89 yo male that presented to ED for complaints of constipation but is unable to recall why he was at the ED later. Did recently have a stay at a rehab facility per family report, and has been increasingly weak at home. PMH of hypertension, atrial fibrillation on Eliquis , CAD, CHF, AAA, Parkinson's disease, and dementia.   OT comments  Mr. Hornig was seen for OT treatment on this date. Upon arrival to room pt seated in recliner, sleeping soundly, wakes to verbal/tactile stim but remains lethargic t/o session. Requires consistent multimodal cueing to initiate functional activity and engage actively in session. OT facilitated ADL management with education and assistance as described below. See ADL section for additional details regarding occupational performance. Pt continues to be functionally limited by cognition, generalized weakness, and decreased activity tolerance. Pt is progressing toward OT goals and continues to benefit from skilled OT services to maximize return to PLOF and minimize risk of future falls, injury, caregiver burden, and readmission. Will continue to follow POC as written. Discharge recommendation remains appropriate.        If plan is discharge home, recommend the following:  Two people to help with walking and/or transfers;A lot of help with bathing/dressing/bathroom;Direct supervision/assist for medications management;Supervision due to cognitive status;Direct supervision/assist for financial management;Assistance with cooking/housework;Assist for transportation;Help with stairs or ramp for entrance   Equipment Recommendations  Other (comment) (defer)    Recommendations for Other Services      Precautions / Restrictions Precautions Precautions: Fall Restrictions Weight Bearing Restrictions Per Provider  Order: No       Mobility Bed Mobility               General bed mobility comments: NT pt in recliner at start/end of session    Transfers Overall transfer level: Needs assistance Equipment used: Rolling walker (2 wheels) Transfers: Sit to/from Stand Sit to Stand: Min assist, From elevated surface                 Balance Overall balance assessment: Needs assistance Sitting-balance support: Feet supported, Bilateral upper extremity supported Sitting balance-Leahy Scale: Good     Standing balance support: Bilateral upper extremity supported, Reliant on assistive device for balance Standing balance-Leahy Scale: Fair                             ADL either performed or assessed with clinical judgement   ADL Overall ADL's : Needs assistance/impaired Eating/Feeding: Set up;Minimal assistance;Sitting Eating/Feeding Details (indicate cue type and reason): Assist for set up of meal tray items. Pt requires min a to initiate bringing hand to mouth for self-feeding this date but once started is able to continue with feeding without physical assistance. Grooming: Wash/dry face;Sitting;Moderate assistance Grooming Details (indicate cue type and reason): Initially quite lethargic/declining functional activity. Requires MOD A to bring washcloth to face and for thoroughness with functional activity this date. Improved engagement with self-feeding see above.                             Functional mobility during ADLs: Minimal assistance;Moderate assistance;Cueing for sequencing;Rolling walker (2 wheels)      Extremity/Trunk Assessment              Vision Baseline Vision/History: 1 Wears glasses Patient  Visual Report: No change from baseline     Perception     Praxis      Cognition Arousal: Lethargic Behavior During Therapy: Flat affect Overall Cognitive Status: No family/caregiver present to determine baseline cognitive functioning                                  General Comments: Pt keeps eyes closed t/o much of session. Does not verbalize much but is notably tearful after functional activity. On one instance states I want to die. I can't move. RN notified/aware. Requires multimodal cueing for initiation of functional tasks.        Exercises Other Exercises Other Exercises: OT facilitated ADL management with education on importance of functional activity during hospital stay, safety, and compensatory ADL management strategies as well as assist as described above. See ADL section for additional detail.    Shoulder Instructions       General Comments      Pertinent Vitals/ Pain       Pain Assessment Faces Pain Scale: Hurts little more Pain Location: generalized Pain Descriptors / Indicators: Grimacing, Guarding Pain Intervention(s): Limited activity within patient's tolerance, Monitored during session, Repositioned  Home Living                                          Prior Functioning/Environment              Frequency  Min 1X/week        Progress Toward Goals  OT Goals(current goals can now be found in the care plan section)  Progress towards OT goals: Progressing toward goals  Acute Rehab OT Goals Patient Stated Goal: improve strength OT Goal Formulation: With patient Time For Goal Achievement: 05/28/23 Potential to Achieve Goals: Fair  Plan      Co-evaluation                 AM-PAC OT 6 Clicks Daily Activity     Outcome Measure   Help from another person eating meals?: A Little Help from another person taking care of personal grooming?: A Lot Help from another person toileting, which includes using toliet, bedpan, or urinal?: A Lot Help from another person bathing (including washing, rinsing, drying)?: A Lot Help from another person to put on and taking off regular upper body clothing?: A Lot Help from another person to put on and taking off  regular lower body clothing?: A Lot 6 Click Score: 13    End of Session Equipment Utilized During Treatment: Gait belt;Rolling walker (2 wheels)  OT Visit Diagnosis: Other abnormalities of gait and mobility (R26.89);Muscle weakness (generalized) (M62.81)   Activity Tolerance Patient tolerated treatment well   Patient Left in chair;with call bell/phone within reach;with chair alarm set   Nurse Communication Mobility status        Time: 8667-8642 OT Time Calculation (min): 25 min  Charges: OT General Charges $OT Visit: 1 Visit OT Treatments $Self Care/Home Management : 23-37 mins  Jhonny Pelton, M.S., OTR/L 05/17/23, 3:10 PM

## 2023-05-17 NOTE — Progress Notes (Addendum)
 PROGRESS NOTE    Scott Cortez   FMW:[id number] DOB: 09/12/1933  DOA: 05/13/2023 Date of Service: 05/17/23 which is hospital day 3  PCP: [facility] course / significant events:   HPI: Scott Cortez is an 89 year old male with history of atrial fibrillation on Eliquis , Parkinson's, hyperlipidemia, hypertension, dementia, who presents from home to the emergency department for chief concerns of weakness.  Hypotensive in ED, admitted to hospitalist service. Infectious workup so far negative.   Patient is debilitated, seen by PT advised SNF placement.  Placement pending    Consultants:  Behavioral Health   Procedures/Surgeries: none      ASSESSMENT & PLAN:   Hypotension Secondary to hypovolemia due to poor PO intake in setting of depression Infectious workup negative  Hold antihypertensive medications.  Goal MAP greater than 65.   Lactic acid blood increased Repeat one normal. No sepsis. Repeat as needed   UTI, proteus  UA 12/31, no abx started  Initiate ceftriaxone  today   Physical deconditioning Frequent falls Fall precaution. PT/ OT evaluation. TOC working on SNF placement    Atrial fibrillation  Eliquis  5 mg p.o. twice daily  Hold Coreg  due to low BP, rate has been at goal  Metoprolol  5 mg IV every 4 hours as needed for heart rate greater than 120, 3 doses ordered   Poor oral intake Registered dietitian consulted Encourage oral diet, supplements.   Parkinson disease Resumed Sinemet . Has baseline hand tremors. Need assistance with feeding.   BPH with obstruction/lower urinary tract symptoms Continue flomax .   Depression Behavioral health consult appreciated, no further work up nor meds recommended.   Restless leg syndrome Ropinirole  0.25 mg nightly resumed   Hyperlipidemia Continue atorvastatin  2  Borderline underweight based on BMI: Body mass index is 19.68 kg/m.  Significantly low or high BMI  is associated with higher medical risk.  Weight management advised as adjunct to other disease management and risk reduction treatments    DVT prophylaxis: eliquis  IV fluids: no continuous IV fluids  Nutrition: vegetarian diet  Central lines / invasive devices: none  Code Status: DNR ACP documentation reviewed: DNR and MOST on file in [facility]  TOC needs: SNF placement  Barriers to dispo / significant pending items: placement              Subjective / Brief ROS:  Patient reports constipation  Denies CP/SOB.  Pain controlled.  Denies new weakness.  Tolerating diet but reports low appetite   Family Communication: will call family later today and update note if/when they are able to be reached     Objective Findings:  Vitals:   05/16/23 2029 05/17/23 0153 05/17/23 0755 05/17/23 1418  BP: 101/73 94/60 (!) 121/91 107/82  Pulse: 91 96 (!) 101 92  Resp: 19 20 18 16   Temp: 97.9 F (36.6 C) 97.8 F (36.6 C) 98 F (36.7 C) 98.2 F (36.8 C)  TempSrc: Oral Oral    SpO2: 97% 98% 99% 99%  Weight:      Height:        Intake/Output Summary (Last 24 hours) at 05/17/2023 1656 Last data filed at 05/17/2023 0800 Gross per 24 hour  Intake --  Output 700 ml  Net -700 ml   Filed Weights   05/12/23 2039 05/14/23 2348  Weight: 66 kg 62.2 kg    Examination:  Physical Exam Constitutional:      General: He is not in acute distress.    Appearance: He  is not ill-appearing.  Cardiovascular:     Rate and Rhythm: Normal rate and regular rhythm.  Pulmonary:     Effort: Pulmonary effort is normal.     Breath sounds: Normal breath sounds.  Abdominal:     General: Abdomen is flat.     Palpations: Abdomen is soft.  Musculoskeletal:     Right lower leg: No edema.     Left lower leg: No edema.  Neurological:     Mental Status: He is alert. Mental status is at baseline.          Scheduled Medications:   apixaban   5 mg Oral BID   atorvastatin   20 mg Oral QHS    carbidopa -levodopa   1 tablet Oral QHS   carbidopa -levodopa   2 tablet Oral QID   feeding supplement  237 mL Oral TID BM   multivitamin with minerals  1 tablet Oral Daily   polyethylene glycol  17 g Oral Daily   rOPINIRole   0.25 mg Oral QHS   senna-docusate  2 tablet Oral QHS   tamsulosin   0.4 mg Oral Q supper   thiamine   100 mg Oral Daily    Continuous Infusions:  cefTRIAXone  (ROCEPHIN )  IV      PRN Medications:  acetaminophen  **OR** acetaminophen , hydrALAZINE , hydrocortisone , metoprolol  tartrate, ondansetron  **OR** ondansetron  (ZOFRAN ) IV  Antimicrobials from admission:  Anti-infectives (From admission, onward)    Start     Dose/Rate Route Frequency Ordered Stop   05/17/23 1745  cefTRIAXone  (ROCEPHIN ) 1 g in sodium chloride  0.9 % 100 mL IVPB        1 g 200 mL/hr over 30 Minutes Intravenous Every 24 hours 05/17/23 1654             Data Reviewed:  I have personally reviewed the following...  CBC: Recent Labs  Lab 05/13/23 0924 05/14/23 1508 05/15/23 0440  WBC 4.6 5.3 5.7  NEUTROABS 3.0  --   --   HGB 15.0 14.0 13.6  HCT 44.0 40.8 39.9  MCV 92.2 91.7 90.3  PLT 163 145* 146*   Basic Metabolic Panel: Recent Labs  Lab 05/13/23 0924 05/14/23 1508 05/15/23 0440  NA 135 133* 133*  K 4.5 4.1 3.8  CL 100 98 101  CO2 28 26 24   GLUCOSE 92 126* 85  BUN 18 19 17   CREATININE 0.71 0.75 0.67  CALCIUM  8.9 8.9 8.7*  MG 2.1  --   --    GFR: Estimated Creatinine Clearance: 57.2 mL/min (by C-G formula based on SCr of 0.67 mg/dL). Liver Function Tests: Recent Labs  Lab 05/13/23 0924  AST 25  ALT 18  ALKPHOS 100  BILITOT 1.6*  PROT 6.6  ALBUMIN 3.3*   No results for input(s): LIPASE, AMYLASE in the last 168 hours. No results for input(s): AMMONIA in the last 168 hours. Coagulation Profile: No results for input(s): INR, PROTIME in the last 168 hours. Cardiac Enzymes: No results for input(s): CKTOTAL, CKMB, CKMBINDEX, TROPONINI in the last 168  hours. BNP (last 3 results) No results for input(s): PROBNP in the last 8760 hours. HbA1C: No results for input(s): HGBA1C in the last 72 hours. CBG: No results for input(s): GLUCAP in the last 168 hours. Lipid Profile: Recent Labs    05/15/23 0440  CHOL 105  HDL 38*  LDLCALC 59  TRIG 39  CHOLHDL 2.8   Thyroid  Function Tests: No results for input(s): TSH, T4TOTAL, FREET4, T3FREE, THYROIDAB in the last 72 hours. Anemia Panel: No results for input(s): VITAMINB12, FOLATE,  FERRITIN, TIBC, IRON, RETICCTPCT in the last 72 hours. Most Recent Urinalysis On File:     Component Value Date/Time   COLORURINE YELLOW (A) 05/14/2023 1508   APPEARANCEUR TURBID (A) 05/14/2023 1508   APPEARANCEUR Clear 10/24/2011 2127   LABSPEC 1.031 (H) 05/14/2023 1508   LABSPEC 1.003 10/24/2011 2127   PHURINE 6.0 05/14/2023 1508   GLUCOSEU NEGATIVE 05/14/2023 1508   GLUCOSEU Negative 10/24/2011 2127   HGBUR MODERATE (A) 05/14/2023 1508   BILIRUBINUR NEGATIVE 05/14/2023 1508   BILIRUBINUR Negative 10/24/2011 2127   KETONESUR 5 (A) 05/14/2023 1508   PROTEINUR 100 (A) 05/14/2023 1508   NITRITE NEGATIVE 05/14/2023 1508   LEUKOCYTESUR LARGE (A) 05/14/2023 1508   LEUKOCYTESUR Negative 10/24/2011 2127   Sepsis Labs: @LABRCNTIP (procalcitonin:4,lacticidven:4) Microbiology: Recent Results (from the past 240 hours)  Urine Culture     Status: Abnormal   Collection Time: 05/14/23  3:08 PM   Specimen: Urine, Clean Catch  Result Value Ref Range Status   Specimen Description   Final    URINE, CLEAN CATCH Performed at [facility], [address], [place], [place] [address]    Special Requests   Final    NONE Performed at [facility], [address], [place], [place] [address]    Culture >=100,000 COLONIES/mL PROTEUS MIRABILIS (A)  Final   Report Status 05/17/2023 FINAL  Final   Organism ID, Bacteria PROTEUS MIRABILIS (A)  Final      Susceptibility    Proteus mirabilis - MIC*    AMPICILLIN <=2 SENSITIVE Sensitive     CEFAZOLIN  8 SENSITIVE Sensitive     CEFEPIME <=0.12 SENSITIVE Sensitive     CEFTRIAXONE  <=0.25 SENSITIVE Sensitive     CIPROFLOXACIN  <=0.25 SENSITIVE Sensitive     GENTAMICIN <=1 SENSITIVE Sensitive     IMIPENEM 2 SENSITIVE Sensitive     NITROFURANTOIN 128 RESISTANT Resistant     TRIMETH/SULFA <=20 SENSITIVE Sensitive     AMPICILLIN/SULBACTAM <=2 SENSITIVE Sensitive     PIP/TAZO <=4 SENSITIVE Sensitive ug/mL    * >=100,000 COLONIES/mL PROTEUS MIRABILIS  Blood culture (routine x 2)     Status: Abnormal   Collection Time: 05/14/23  5:40 PM   Specimen: BLOOD  Result Value Ref Range Status   Specimen Description   Final    BLOOD BLOOD RIGHT HAND Performed at [facility], [address], [place], [place] [address]    Special Requests   Final    BOTTLES DRAWN AEROBIC AND ANAEROBIC Blood Culture results may not be optimal due to an inadequate volume of blood received in culture bottles Performed at [facility], [address] [place], [place] [address]    Culture  Setup Time   Final    GRAM POSITIVE COCCI AEROBIC BOTTLE ONLY Organism ID to follow CRITICAL RESULT CALLED TO, READ BACK BY AND VERIFIED WITH: NATHAN BELUE PHARMD @1903  05/15/23 ASW    Culture (A)  Final    STAPHYLOCOCCUS EPIDERMIDIS THE SIGNIFICANCE OF ISOLATING THIS ORGANISM FROM A SINGLE SET OF BLOOD CULTURES WHEN MULTIPLE SETS ARE DRAWN IS UNCERTAIN. PLEASE NOTIFY THE MICROBIOLOGY DEPARTMENT WITHIN ONE WEEK IF SPECIATION AND SENSITIVITIES ARE REQUIRED. Performed at [facility], [address], [place], [place] [address]    Report Status 05/17/2023 FINAL  Final  SARS Coronavirus 2 by RT PCR (hospital order, performed in [facility] hospital lab) *cepheid single result test* Anterior Nasal Swab     Status: None   Collection Time: 05/14/23  5:40 PM  Specimen: Anterior Nasal Swab  Result Value Ref Range Status   SARS  Coronavirus 2 by RT PCR NEGATIVE NEGATIVE Final    Comment: (NOTE) SARS-CoV-2 target nucleic acids are NOT DETECTED.  The SARS-CoV-2 RNA is generally detectable in upper and lower respiratory specimens during the acute phase of infection. The lowest concentration of SARS-CoV-2 viral copies this assay can detect is 250 copies / mL. A negative result does not preclude SARS-CoV-2 infection and should not be used as the sole basis for treatment or other patient management decisions.  A negative result may occur with improper specimen collection / handling, submission of specimen other than nasopharyngeal swab, presence of viral mutation(s) within the areas targeted by this assay, and inadequate number of viral copies (<250 copies / mL). A negative result must be combined with clinical observations, patient history, and epidemiological information.  Fact Sheet for Patients:   roadlaptop.co.za  Fact Sheet for Healthcare Providers: http://kim-miller.com/  This test is not yet approved or  cleared by the [place]  FDA and has been authorized for detection and/or diagnosis of SARS-CoV-2 by FDA under an Emergency Use Authorization (EUA).  This EUA will remain in effect (meaning this test can be used) for the duration of the COVID-19 declaration under Section 564(b)(1) of the Act, 21 U.S.C. section 360bbb-3(b)(1), unless the authorization is terminated or revoked sooner.  Performed at [facility], [address] [place], [place] [address]   Blood Culture ID Panel (Reflexed)     Status: Abnormal   Collection Time: 05/14/23  5:40 PM  Result Value Ref Range Status   Enterococcus faecalis NOT DETECTED NOT DETECTED Final   Enterococcus Faecium NOT DETECTED NOT DETECTED Final   Listeria monocytogenes NOT DETECTED NOT DETECTED Final   Staphylococcus species DETECTED (A) NOT DETECTED Final    Comment: CRITICAL RESULT CALLED TO, READ  BACK BY AND VERIFIED WITH: NATHAN BELUE PHARMD @1903  05/15/23 ASW    Staphylococcus aureus (BCID) NOT DETECTED NOT DETECTED Final   Staphylococcus epidermidis DETECTED (A) NOT DETECTED Final    Comment: CRITICAL RESULT CALLED TO, READ BACK BY AND VERIFIED WITH: NATHAN BELUE PHARMD @1903  05/15/23 ASW    Staphylococcus lugdunensis NOT DETECTED NOT DETECTED Final   Streptococcus species NOT DETECTED NOT DETECTED Final   Streptococcus agalactiae NOT DETECTED NOT DETECTED Final   Streptococcus pneumoniae NOT DETECTED NOT DETECTED Final   Streptococcus pyogenes NOT DETECTED NOT DETECTED Final   A.calcoaceticus-baumannii NOT DETECTED NOT DETECTED Final   Bacteroides fragilis NOT DETECTED NOT DETECTED Final   Enterobacterales NOT DETECTED NOT DETECTED Final   Enterobacter cloacae complex NOT DETECTED NOT DETECTED Final   Escherichia coli NOT DETECTED NOT DETECTED Final   Klebsiella aerogenes NOT DETECTED NOT DETECTED Final   Klebsiella oxytoca NOT DETECTED NOT DETECTED Final   Klebsiella pneumoniae NOT DETECTED NOT DETECTED Final   Proteus species NOT DETECTED NOT DETECTED Final   Salmonella species NOT DETECTED NOT DETECTED Final   Serratia marcescens NOT DETECTED NOT DETECTED Final   Haemophilus influenzae NOT DETECTED NOT DETECTED Final   Neisseria meningitidis NOT DETECTED NOT DETECTED Final   Pseudomonas aeruginosa NOT DETECTED NOT DETECTED Final   Stenotrophomonas maltophilia NOT DETECTED NOT DETECTED Final   Candida albicans NOT DETECTED NOT DETECTED Final   Candida auris NOT DETECTED NOT DETECTED Final   Candida glabrata NOT DETECTED NOT DETECTED Final   Candida krusei NOT DETECTED NOT DETECTED Final   Candida parapsilosis NOT DETECTED NOT DETECTED Final   Candida tropicalis NOT  DETECTED NOT DETECTED Final   Cryptococcus neoformans/gattii NOT DETECTED NOT DETECTED Final   Methicillin resistance mecA/C NOT DETECTED NOT DETECTED Final    Comment: Performed at [facility], [address] [place], [place] [address]  Blood culture (routine x 2)     Status: None (Preliminary result)   Collection Time: 05/14/23  5:45 PM   Specimen: BLOOD  Result Value Ref Range Status   Specimen Description BLOOD BLOOD LEFT HAND  Final   Special Requests   Final    BOTTLES DRAWN AEROBIC AND ANAEROBIC Blood Culture results may not be optimal due to an inadequate volume of blood received in culture bottles   Culture   Final    NO GROWTH 3 DAYS Performed at [facility], [address] [place], [place] [address]    Report Status PENDING  Incomplete  MRSA Next Gen by PCR, Nasal     Status: None   Collection Time: 05/15/23  1:53 AM   Specimen: Nasal Mucosa; Nasal Swab  Result Value Ref Range Status   MRSA by PCR Next Gen NOT DETECTED NOT DETECTED Final    Comment: (NOTE) The GeneXpert MRSA Assay (FDA approved for NASAL specimens only), is one component of a comprehensive MRSA colonization surveillance program. It is not intended to diagnose MRSA infection nor to guide or monitor treatment for MRSA infections. Test performance is not FDA approved in patients less than 9 years old. Performed at [facility], [address] [place], [place] [address]   Respiratory (~20 pathogens) panel by PCR     Status: None   Collection Time: 05/15/23  1:53 AM   Specimen: Nasal Mucosa  Result Value Ref Range Status   Adenovirus NOT DETECTED NOT DETECTED Final   Coronavirus 229E NOT DETECTED NOT DETECTED Final    Comment: (NOTE) The Coronavirus on the Respiratory Panel, DOES NOT test for the novel  Coronavirus (2019 nCoV)    Coronavirus HKU1 NOT DETECTED NOT DETECTED Final   Coronavirus NL63 NOT DETECTED NOT DETECTED Final   Coronavirus OC43 NOT DETECTED NOT DETECTED Final   Metapneumovirus NOT DETECTED NOT DETECTED Final   Rhinovirus / Enterovirus NOT DETECTED NOT DETECTED Final   Influenza A NOT DETECTED NOT DETECTED Final   Influenza B NOT DETECTED NOT DETECTED  Final   Parainfluenza Virus 1 NOT DETECTED NOT DETECTED Final   Parainfluenza Virus 2 NOT DETECTED NOT DETECTED Final   Parainfluenza Virus 3 NOT DETECTED NOT DETECTED Final   Parainfluenza Virus 4 NOT DETECTED NOT DETECTED Final   Respiratory Syncytial Virus NOT DETECTED NOT DETECTED Final   Bordetella pertussis NOT DETECTED NOT DETECTED Final   Bordetella Parapertussis NOT DETECTED NOT DETECTED Final   Chlamydophila pneumoniae NOT DETECTED NOT DETECTED Final   Mycoplasma pneumoniae NOT DETECTED NOT DETECTED Final    Comment: Performed at [facility], [address] [place], [place] [address]      Radiology Studies last 3 days: DG Chest Port 1 View Result Date: 05/14/2023 CLINICAL DATA:  Weakness EXAM: PORTABLE CHEST 1 VIEW COMPARISON:  01/18/2023 chest radiograph. FINDINGS: Stable cardiomediastinal silhouette with mild cardiomegaly. No pneumothorax. No pleural effusion. Lungs appear clear, with no acute consolidative airspace disease and no pulmonary edema. IMPRESSION: Mild cardiomegaly. No active pulmonary disease.  No pulmonary edema. Electronically Signed   By: Selinda DELENA Blue M.D.   On: 05/14/2023 18:50         Rosalee Tolley, DO [facility] 05/17/2023, 4:56 PM  Dictation software may have been used to generate the above note. Typos may occur and escape review in typed/dictated notes. Please contact Dr Marsa directly for clarity if needed.  Staff may message me via secure chat in Epic  but this may not receive an immediate response,  please page me for urgent matters!  If 7PM-7AM, please contact night coverage [email or web address]

## 2023-05-18 DIAGNOSIS — E861 Hypovolemia: Secondary | ICD-10-CM | POA: Diagnosis not present

## 2023-05-18 NOTE — Plan of Care (Signed)

## 2023-05-18 NOTE — Progress Notes (Signed)
 PROGRESS NOTE    Scott Cortez   FMW:[id number] DOB: January 10, 1934  DOA: 05/13/2023 Date of Service: 05/18/23 which is hospital day 4  PCP: [facility] course / significant events:   HPI: [name] is an 89 year old male with history of atrial fibrillation on Eliquis , Parkinson's, hyperlipidemia, hypertension, dementia, who presents from home to the emergency department for chief concerns of weakness.  Hypotensive in ED, admitted to hospitalist service. Infectious workup so far negative.   Patient is debilitated, seen by PT advised SNF placement.  Placement pending    Consultants:  Behavioral Health   Procedures/Surgeries: none      ASSESSMENT & PLAN:   Hypotension Secondary to hypovolemia due to poor PO intake in setting of depression Infectious workup negative  Hold antihypertensive medications.  Goal MAP greater than 65.   Lactic acid blood increased Repeat one normal. No sepsis. Repeat as needed   UTI, proteus  UA 12/31, no abx started  Initiated ceftriaxone  1/3   Physical deconditioning Frequent falls Fall precaution. PT/ OT evaluation. TOC working on SNF placement    Atrial fibrillation  Eliquis  5 mg p.o. twice daily  Hold Coreg  due to low BP, rate has been at goal  Metoprolol  5 mg IV every 4 hours as needed for heart rate greater than 120, 3 doses ordered   Poor oral intake Registered dietitian consulted Encourage oral diet, supplements.   Parkinson disease Resumed Sinemet . Has baseline hand tremors. Need assistance with feeding.   BPH with obstruction/lower urinary tract symptoms Continue flomax .   Depression Behavioral health consult appreciated, no further work up nor meds recommended.   Restless leg syndrome Ropinirole  0.25 mg nightly resumed   Hyperlipidemia Continue atorvastatin  2  Borderline underweight based on BMI: Body mass index is 19.68 kg/m.  Significantly low or high BMI  is associated with higher medical risk.  Weight management advised as adjunct to other disease management and risk reduction treatments    DVT prophylaxis: eliquis  IV fluids: no continuous IV fluids  Nutrition: vegetarian diet  Central lines / invasive devices: none  Code Status: DNR ACP documentation reviewed: DNR and MOST on file in [facility]  TOC needs: SNF placement  Barriers to dispo / significant pending items: placement              Subjective / Brief ROS:  Patient reports constipation has resolved He is feeling more energetic this morning  No other concerns   Denies CP/SOB.  Pain controlled.  Denies new weakness.  Tolerating diet but reports low appetite   Family Communication: will call family later today and update note if/when they are able to be reached     Objective Findings:  Vitals:   05/17/23 0755 05/17/23 1418 05/17/23 2200 05/18/23 0826  BP: (!) 121/91 107/82 95/73 105/70  Pulse: (!) 101 92 89 69  Resp: 18 16 20 17   Temp: 98 F (36.7 C) 98.2 F (36.8 C) 98.3 F (36.8 C) 97.7 F (36.5 C)  TempSrc:   Axillary Oral  SpO2: 99% 99% 100% 98%  Weight:      Height:        Intake/Output Summary (Last 24 hours) at 05/18/2023 1352 Last data filed at 05/18/2023 0230 Gross per 24 hour  Intake 120 ml  Output 400 ml  Net -280 ml   Filed Weights   05/12/23 2039 05/14/23 2348  Weight: 66 kg 62.2 kg    Examination:  Physical Exam Constitutional:  General: He is not in acute distress.    Appearance: He is not ill-appearing.  Cardiovascular:     Rate and Rhythm: Normal rate and regular rhythm.  Pulmonary:     Effort: Pulmonary effort is normal.     Breath sounds: Normal breath sounds.  Abdominal:     General: Abdomen is flat.     Palpations: Abdomen is soft.  Musculoskeletal:     Right lower leg: No edema.     Left lower leg: No edema.  Neurological:     General: No focal deficit present.     Mental Status: He is alert and oriented to  person, place, and time. Mental status is at baseline.          Scheduled Medications:   apixaban   5 mg Oral BID   atorvastatin   20 mg Oral QHS   carbidopa -levodopa   1 tablet Oral QHS   carbidopa -levodopa   2 tablet Oral QID   feeding supplement  237 mL Oral TID BM   multivitamin with minerals  1 tablet Oral Daily   polyethylene glycol  17 g Oral Daily   rOPINIRole   0.25 mg Oral QHS   tamsulosin   0.4 mg Oral Q supper   thiamine   100 mg Oral Daily    Continuous Infusions:  cefTRIAXone  (ROCEPHIN )  IV 1 g (05/17/23 1745)    PRN Medications:  acetaminophen  **OR** acetaminophen , hydrALAZINE , hydrocortisone , metoprolol  tartrate, ondansetron  **OR** ondansetron  (ZOFRAN ) IV  Antimicrobials from admission:  Anti-infectives (From admission, onward)    Start     Dose/Rate Route Frequency Ordered Stop   05/17/23 1800  cefTRIAXone  (ROCEPHIN ) 1 g in sodium chloride  0.9 % 100 mL IVPB        1 g 200 mL/hr over 30 Minutes Intravenous Every 24 hours 05/17/23 1654             Data Reviewed:  I have personally reviewed the following...  CBC: Recent Labs  Lab 05/13/23 0924 05/14/23 1508 05/15/23 0440  WBC 4.6 5.3 5.7  NEUTROABS 3.0  --   --   HGB 15.0 14.0 13.6  HCT 44.0 40.8 39.9  MCV 92.2 91.7 90.3  PLT 163 145* 146*   Basic Metabolic Panel: Recent Labs  Lab 05/13/23 0924 05/14/23 1508 05/15/23 0440  NA 135 133* 133*  K 4.5 4.1 3.8  CL 100 98 101  CO2 28 26 24   GLUCOSE 92 126* 85  BUN 18 19 17   CREATININE 0.71 0.75 0.67  CALCIUM  8.9 8.9 8.7*  MG 2.1  --   --    GFR: Estimated Creatinine Clearance: 57.2 mL/min (by C-G formula based on SCr of 0.67 mg/dL). Liver Function Tests: Recent Labs  Lab 05/13/23 0924  AST 25  ALT 18  ALKPHOS 100  BILITOT 1.6*  PROT 6.6  ALBUMIN 3.3*   No results for input(s): LIPASE, AMYLASE in the last 168 hours. No results for input(s): AMMONIA in the last 168 hours. Coagulation Profile: No results for input(s):  INR, PROTIME in the last 168 hours. Cardiac Enzymes: No results for input(s): CKTOTAL, CKMB, CKMBINDEX, TROPONINI in the last 168 hours. BNP (last 3 results) No results for input(s): PROBNP in the last 8760 hours. HbA1C: No results for input(s): HGBA1C in the last 72 hours. CBG: No results for input(s): GLUCAP in the last 168 hours. Lipid Profile: No results for input(s): CHOL, HDL, LDLCALC, TRIG, CHOLHDL, LDLDIRECT in the last 72 hours.  Thyroid  Function Tests: No results for input(s): TSH, T4TOTAL, FREET4, T3FREE,  THYROIDAB in the last 72 hours. Anemia Panel: No results for input(s): VITAMINB12, FOLATE, FERRITIN, TIBC, IRON, RETICCTPCT in the last 72 hours. Most Recent Urinalysis On File:     Component Value Date/Time   COLORURINE YELLOW (A) 05/14/2023 1508   APPEARANCEUR TURBID (A) 05/14/2023 1508   APPEARANCEUR Clear 10/24/2011 2127   LABSPEC 1.031 (H) 05/14/2023 1508   LABSPEC 1.003 10/24/2011 2127   PHURINE 6.0 05/14/2023 1508   GLUCOSEU NEGATIVE 05/14/2023 1508   GLUCOSEU Negative 10/24/2011 2127   HGBUR MODERATE (A) 05/14/2023 1508   BILIRUBINUR NEGATIVE 05/14/2023 1508   BILIRUBINUR Negative 10/24/2011 2127   KETONESUR 5 (A) 05/14/2023 1508   PROTEINUR 100 (A) 05/14/2023 1508   NITRITE NEGATIVE 05/14/2023 1508   LEUKOCYTESUR LARGE (A) 05/14/2023 1508   LEUKOCYTESUR Negative 10/24/2011 2127   Sepsis Labs: @LABRCNTIP (procalcitonin:4,lacticidven:4) Microbiology: Recent Results (from the past 240 hours)  Urine Culture     Status: Abnormal   Collection Time: 05/14/23  3:08 PM   Specimen: Urine, Clean Catch  Result Value Ref Range Status   Specimen Description   Final    URINE, CLEAN CATCH Performed at [facility], [address], [place], [place] [address]    Special Requests   Final    NONE Performed at [facility], [address] [place], [place] [address]    Culture >=100,000  COLONIES/mL PROTEUS MIRABILIS (A)  Final   Report Status 05/17/2023 FINAL  Final   Organism ID, Bacteria PROTEUS MIRABILIS (A)  Final      Susceptibility   Proteus mirabilis - MIC*    AMPICILLIN <=2 SENSITIVE Sensitive     CEFAZOLIN  8 SENSITIVE Sensitive     CEFEPIME <=0.12 SENSITIVE Sensitive     CEFTRIAXONE  <=0.25 SENSITIVE Sensitive     CIPROFLOXACIN  <=0.25 SENSITIVE Sensitive     GENTAMICIN <=1 SENSITIVE Sensitive     IMIPENEM 2 SENSITIVE Sensitive     NITROFURANTOIN 128 RESISTANT Resistant     TRIMETH/SULFA <=20 SENSITIVE Sensitive     AMPICILLIN/SULBACTAM <=2 SENSITIVE Sensitive     PIP/TAZO <=4 SENSITIVE Sensitive ug/mL    * >=100,000 COLONIES/mL PROTEUS MIRABILIS  Blood culture (routine x 2)     Status: Abnormal   Collection Time: 05/14/23  5:40 PM   Specimen: BLOOD  Result Value Ref Range Status   Specimen Description   Final    BLOOD BLOOD RIGHT HAND Performed at [facility], [address], [place], [place] [address]    Special Requests   Final    BOTTLES DRAWN AEROBIC AND ANAEROBIC Blood Culture results may not be optimal due to an inadequate volume of blood received in culture bottles Performed at [facility], [address] [place], [place] [address]    Culture  Setup Time   Final    GRAM POSITIVE COCCI AEROBIC BOTTLE ONLY Organism ID to follow CRITICAL RESULT CALLED TO, READ BACK BY AND VERIFIED WITH: NATHAN BELUE PHARMD @1903  05/15/23 ASW    Culture (A)  Final    STAPHYLOCOCCUS EPIDERMIDIS THE SIGNIFICANCE OF ISOLATING THIS ORGANISM FROM A SINGLE SET OF BLOOD CULTURES WHEN MULTIPLE SETS ARE DRAWN IS UNCERTAIN. PLEASE NOTIFY THE MICROBIOLOGY DEPARTMENT WITHIN ONE WEEK IF SPECIATION AND SENSITIVITIES ARE REQUIRED. Performed at [facility], [address], [place], [place] [address]    Report Status 05/17/2023 FINAL  Final  SARS Coronavirus 2 by RT PCR (hospital order, performed in [facility] hospital lab) *cepheid single result  test* Anterior Nasal Swab  Status: None   Collection Time: 05/14/23  5:40 PM   Specimen: Anterior Nasal Swab  Result Value Ref Range Status   SARS Coronavirus 2 by RT PCR NEGATIVE NEGATIVE Final    Comment: (NOTE) SARS-CoV-2 target nucleic acids are NOT DETECTED.  The SARS-CoV-2 RNA is generally detectable in upper and lower respiratory specimens during the acute phase of infection. The lowest concentration of SARS-CoV-2 viral copies this assay can detect is 250 copies / mL. A negative result does not preclude SARS-CoV-2 infection and should not be used as the sole basis for treatment or other patient management decisions.  A negative result may occur with improper specimen collection / handling, submission of specimen other than nasopharyngeal swab, presence of viral mutation(s) within the areas targeted by this assay, and inadequate number of viral copies (<250 copies / mL). A negative result must be combined with clinical observations, patient history, and epidemiological information.  Fact Sheet for Patients:   roadlaptop.co.za  Fact Sheet for Healthcare Providers: http://kim-miller.com/  This test is not yet approved or  cleared by the [place]  FDA and has been authorized for detection and/or diagnosis of SARS-CoV-2 by FDA under an Emergency Use Authorization (EUA).  This EUA will remain in effect (meaning this test can be used) for the duration of the COVID-19 declaration under Section 564(b)(1) of the Act, 21 U.S.C. section 360bbb-3(b)(1), unless the authorization is terminated or revoked sooner.  Performed at [facility], [address] [place], [place] [address]   Blood Culture ID Panel (Reflexed)     Status: Abnormal   Collection Time: 05/14/23  5:40 PM  Result Value Ref Range Status   Enterococcus faecalis NOT DETECTED NOT DETECTED Final   Enterococcus Faecium NOT DETECTED NOT DETECTED Final    Listeria monocytogenes NOT DETECTED NOT DETECTED Final   Staphylococcus species DETECTED (A) NOT DETECTED Final    Comment: CRITICAL RESULT CALLED TO, READ BACK BY AND VERIFIED WITH: NATHAN BELUE PHARMD @1903  05/15/23 ASW    Staphylococcus aureus (BCID) NOT DETECTED NOT DETECTED Final   Staphylococcus epidermidis DETECTED (A) NOT DETECTED Final    Comment: CRITICAL RESULT CALLED TO, READ BACK BY AND VERIFIED WITH: NATHAN BELUE PHARMD @1903  05/15/23 ASW    Staphylococcus lugdunensis NOT DETECTED NOT DETECTED Final   Streptococcus species NOT DETECTED NOT DETECTED Final   Streptococcus agalactiae NOT DETECTED NOT DETECTED Final   Streptococcus pneumoniae NOT DETECTED NOT DETECTED Final   Streptococcus pyogenes NOT DETECTED NOT DETECTED Final   A.calcoaceticus-baumannii NOT DETECTED NOT DETECTED Final   Bacteroides fragilis NOT DETECTED NOT DETECTED Final   Enterobacterales NOT DETECTED NOT DETECTED Final   Enterobacter cloacae complex NOT DETECTED NOT DETECTED Final   Escherichia coli NOT DETECTED NOT DETECTED Final   Klebsiella aerogenes NOT DETECTED NOT DETECTED Final   Klebsiella oxytoca NOT DETECTED NOT DETECTED Final   Klebsiella pneumoniae NOT DETECTED NOT DETECTED Final   Proteus species NOT DETECTED NOT DETECTED Final   Salmonella species NOT DETECTED NOT DETECTED Final   Serratia marcescens NOT DETECTED NOT DETECTED Final   Haemophilus influenzae NOT DETECTED NOT DETECTED Final   Neisseria meningitidis NOT DETECTED NOT DETECTED Final   Pseudomonas aeruginosa NOT DETECTED NOT DETECTED Final   Stenotrophomonas maltophilia NOT DETECTED NOT DETECTED Final   Candida albicans NOT DETECTED NOT DETECTED Final   Candida auris NOT DETECTED NOT DETECTED Final   Candida glabrata NOT DETECTED NOT DETECTED Final   Candida krusei NOT DETECTED NOT DETECTED Final   Candida  parapsilosis NOT DETECTED NOT DETECTED Final   Candida tropicalis NOT DETECTED NOT DETECTED Final   Cryptococcus  neoformans/gattii NOT DETECTED NOT DETECTED Final   Methicillin resistance mecA/C NOT DETECTED NOT DETECTED Final    Comment: Performed at [facility], [address] [place], [place] [address]  Blood culture (routine x 2)     Status: None (Preliminary result)   Collection Time: 05/14/23  5:45 PM   Specimen: BLOOD  Result Value Ref Range Status   Specimen Description BLOOD BLOOD LEFT HAND  Final   Special Requests   Final    BOTTLES DRAWN AEROBIC AND ANAEROBIC Blood Culture results may not be optimal due to an inadequate volume of blood received in culture bottles   Culture   Final    NO GROWTH 4 DAYS Performed at [facility], [address] [place], [place] [address]    Report Status PENDING  Incomplete  MRSA Next Gen by PCR, Nasal     Status: None   Collection Time: 05/15/23  1:53 AM   Specimen: Nasal Mucosa; Nasal Swab  Result Value Ref Range Status   MRSA by PCR Next Gen NOT DETECTED NOT DETECTED Final    Comment: (NOTE) The GeneXpert MRSA Assay (FDA approved for NASAL specimens only), is one component of a comprehensive MRSA colonization surveillance program. It is not intended to diagnose MRSA infection nor to guide or monitor treatment for MRSA infections. Test performance is not FDA approved in patients less than 48 years old. Performed at [facility], [address] [place], [place] [address]   Respiratory (~20 pathogens) panel by PCR     Status: None   Collection Time: 05/15/23  1:53 AM   Specimen: Nasal Mucosa  Result Value Ref Range Status   Adenovirus NOT DETECTED NOT DETECTED Final   Coronavirus 229E NOT DETECTED NOT DETECTED Final    Comment: (NOTE) The Coronavirus on the Respiratory Panel, DOES NOT test for the novel  Coronavirus (2019 nCoV)    Coronavirus HKU1 NOT DETECTED NOT DETECTED Final   Coronavirus NL63 NOT DETECTED NOT DETECTED Final   Coronavirus OC43 NOT DETECTED NOT DETECTED Final   Metapneumovirus NOT DETECTED  NOT DETECTED Final   Rhinovirus / Enterovirus NOT DETECTED NOT DETECTED Final   Influenza A NOT DETECTED NOT DETECTED Final   Influenza B NOT DETECTED NOT DETECTED Final   Parainfluenza Virus 1 NOT DETECTED NOT DETECTED Final   Parainfluenza Virus 2 NOT DETECTED NOT DETECTED Final   Parainfluenza Virus 3 NOT DETECTED NOT DETECTED Final   Parainfluenza Virus 4 NOT DETECTED NOT DETECTED Final   Respiratory Syncytial Virus NOT DETECTED NOT DETECTED Final   Bordetella pertussis NOT DETECTED NOT DETECTED Final   Bordetella Parapertussis NOT DETECTED NOT DETECTED Final   Chlamydophila pneumoniae NOT DETECTED NOT DETECTED Final   Mycoplasma pneumoniae NOT DETECTED NOT DETECTED Final    Comment: Performed at [facility], [address], [place], [place] [address]      Radiology Studies last 3 days: DG Chest Port 1 View Result Date: 05/14/2023 CLINICAL DATA:  Weakness EXAM: PORTABLE CHEST 1 VIEW COMPARISON:  01/18/2023 chest radiograph. FINDINGS: Stable cardiomediastinal silhouette with mild cardiomegaly. No pneumothorax. No pleural effusion. Lungs appear clear, with no acute consolidative airspace disease and no pulmonary edema. IMPRESSION: Mild cardiomegaly. No active pulmonary disease.  No pulmonary edema. Electronically Signed   By: Selinda DELENA Blue M.D.   On: 05/14/2023 18:50  Laylah Riga, DO [facility] 05/18/2023, 1:52 PM    Dictation software may have been used to generate the above note. Typos may occur and escape review in typed/dictated notes. Please contact Dr Marsa directly for clarity if needed.  Staff may message me via secure chat in Epic  but this may not receive an immediate response,  please page me for urgent matters!  If 7PM-7AM, please contact night coverage [email or web address]

## 2023-05-18 NOTE — TOC Progression Note (Addendum)
 Transition of Care [facility]) - Progression Note    Patient Details  Name: Scott Cortez MRN: [id number] Date of Birth: [birth year]  Transition of [facility] CM/SW Contact  Tenise Stetler E Meshach Perry, LCSW Phone Number: 05/18/2023, 3:45 PM  Clinical Narrative:    CSW called daughter Suzen and presented current bed offers ([facility], [place], [address], [address], [facility] in [place]). She also requested an emailed list - sent via encrypted email. She is going to review the offers. She is also asking about facilities in the [place] area - CSW explained we can manually fax to any other facilities if she has some she is interested in, [place] states she will look into this and reach back out to CSW tomorrow.        Expected Discharge Plan and Services                                               Social Determinants of Health (SDOH) Interventions SDOH Screenings   Food Insecurity: No Food Insecurity (05/15/2023)  Housing: Low Risk  (05/15/2023)  Transportation Needs: Unmet Transportation Needs (05/15/2023)  Utilities: Not At Risk (05/15/2023)  Social Connections: Socially Isolated (05/15/2023)  Tobacco Use: Medium Risk (05/14/2023)    Readmission Risk Interventions    05/16/2023    4:05 PM  Readmission Risk Prevention Plan  Transportation Screening Complete  Home Care Screening Complete  Medication Review (RN CM) Complete

## 2023-05-19 DIAGNOSIS — E861 Hypovolemia: Secondary | ICD-10-CM | POA: Diagnosis not present

## 2023-05-19 LAB — CULTURE, BLOOD (ROUTINE X 2): Culture: NO GROWTH

## 2023-05-19 MED ORDER — BISACODYL 10 MG RE SUPP
10.0000 mg | Freq: Every day | RECTAL | Status: DC | PRN
  Filled 2023-05-19: qty 1

## 2023-05-19 MED ORDER — SENNOSIDES-DOCUSATE SODIUM 8.6-50 MG PO TABS: 2.0000 | ORAL_TABLET | Freq: Every evening | ORAL | Status: DC | PRN

## 2023-05-19 NOTE — Progress Notes (Signed)
 PROGRESS NOTE    Scott Cortez   FMW:[id number] DOB: 1934-04-17  DOA: 05/13/2023 Date of Service: 05/19/23 which is hospital day 5  PCP: [facility] course / significant events:   HPI: [name] is an 89 year old male with history of atrial fibrillation on Eliquis , Parkinson's, hyperlipidemia, hypertension, dementia, who presents from home to the emergency department for chief concerns of weakness.  Hypotensive in ED, admitted to hospitalist service. Infectious workup so far negative.   Patient is debilitated, seen by PT advised SNF placement.  Placement pending    Consultants:  Behavioral Health   Procedures/Surgeries: none      ASSESSMENT & PLAN:   Hypotension Secondary to hypovolemia due to poor PO intake in setting of depression Infectious workup negative  Hold antihypertensive medications.  Goal MAP greater than 65.   Lactic acid blood increased Repeat one normal. No sepsis. Repeat as needed   UTI, proteus  UA 12/31, no abx started  Initiated ceftriaxone  1/3   Physical deconditioning Frequent falls Fall precaution. PT/ OT evaluation. TOC working on SNF placement    Constipation, fecal impaction Disimpacted 05/19/23 Continue bowel regimen   Atrial fibrillation  Eliquis  5 mg p.o. twice daily  Hold Coreg  due to low BP, rate has been at goal  Metoprolol  5 mg IV every 4 hours as needed for heart rate greater than 120, 3 doses ordered   Poor oral intake Registered dietitian consulted Encourage oral diet, supplements.   Parkinson disease Resumed Sinemet . Has baseline hand tremors. Need assistance with feeding.   BPH with obstruction/lower urinary tract symptoms Continue flomax .   Depression Behavioral health consult appreciated, no further work up nor meds recommended.   Restless leg syndrome Ropinirole  0.25 mg nightly resumed   Hyperlipidemia Continue atorvastatin  2  Borderline  underweight based on BMI: Body mass index is 19.68 kg/m.  Significantly low or high BMI is associated with higher medical risk.  Weight management advised as adjunct to other disease management and risk reduction treatments    DVT prophylaxis: eliquis  IV fluids: no continuous IV fluids  Nutrition: vegetarian diet  Central lines / invasive devices: none  Code Status: DNR ACP documentation reviewed: DNR and MOST on file in [facility]  TOC needs: SNF placement  Barriers to dispo / significant pending items: placement              Subjective / Brief ROS:  Patient reports constipation has resolved No other concerns   Denies CP/SOB.  Pain controlled.  Denies new weakness.  Tolerating diet but reports low appetite   Family Communication: none at this time     Objective Findings:  Vitals:   05/19/23 0310 05/19/23 0858 05/19/23 0904 05/19/23 0935  BP: 103/80 111/78 (!) 154/75 (!) 92/59  Pulse: 90 (!) 57 86 90  Resp: 20 18 18 18   Temp: 97.8 F (36.6 C) 97.7 F (36.5 C) 98.4 F (36.9 C) 98.1 F (36.7 C)  TempSrc: Oral Oral Oral Oral  SpO2: 94% 100% 94% 98%  Weight:      Height:        Intake/Output Summary (Last 24 hours) at 05/19/2023 1531 Last data filed at 05/19/2023 0043 Gross per 24 hour  Intake --  Output 500 ml  Net -500 ml   Filed Weights   05/12/23 2039 05/14/23 2348  Weight: 66 kg 62.2 kg    Examination:  Physical Exam Constitutional:      General: He is not in acute  distress.    Appearance: He is not ill-appearing.  Cardiovascular:     Rate and Rhythm: Normal rate and regular rhythm.  Pulmonary:     Effort: Pulmonary effort is normal.     Breath sounds: Normal breath sounds.  Abdominal:     General: Abdomen is flat.     Palpations: Abdomen is soft.  Musculoskeletal:     Right lower leg: No edema.     Left lower leg: No edema.  Neurological:     General: No focal deficit present.     Mental Status: He is alert and oriented to person,  place, and time. Mental status is at baseline.          Scheduled Medications:   apixaban   5 mg Oral BID   atorvastatin   20 mg Oral QHS   carbidopa -levodopa   1 tablet Oral QHS   carbidopa -levodopa   2 tablet Oral QID   feeding supplement  237 mL Oral TID BM   multivitamin with minerals  1 tablet Oral Daily   polyethylene glycol  17 g Oral Daily   rOPINIRole   0.25 mg Oral QHS   tamsulosin   0.4 mg Oral Q supper   thiamine   100 mg Oral Daily    Continuous Infusions:  cefTRIAXone  (ROCEPHIN )  IV 1 g (05/18/23 1758)    PRN Medications:  acetaminophen  **OR** acetaminophen , bisacodyl , hydrALAZINE , hydrocortisone , metoprolol  tartrate, ondansetron  **OR** ondansetron  (ZOFRAN ) IV, senna-docusate  Antimicrobials from admission:  Anti-infectives (From admission, onward)    Start     Dose/Rate Route Frequency Ordered Stop   05/17/23 1800  cefTRIAXone  (ROCEPHIN ) 1 g in sodium chloride  0.9 % 100 mL IVPB        1 g 200 mL/hr over 30 Minutes Intravenous Every 24 hours 05/17/23 1654             Data Reviewed:  I have personally reviewed the following...  CBC: Recent Labs  Lab 05/13/23 0924 05/14/23 1508 05/15/23 0440  WBC 4.6 5.3 5.7  NEUTROABS 3.0  --   --   HGB 15.0 14.0 13.6  HCT 44.0 40.8 39.9  MCV 92.2 91.7 90.3  PLT 163 145* 146*   Basic Metabolic Panel: Recent Labs  Lab 05/13/23 0924 05/14/23 1508 05/15/23 0440  NA 135 133* 133*  K 4.5 4.1 3.8  CL 100 98 101  CO2 28 26 24   GLUCOSE 92 126* 85  BUN 18 19 17   CREATININE 0.71 0.75 0.67  CALCIUM  8.9 8.9 8.7*  MG 2.1  --   --    GFR: Estimated Creatinine Clearance: 57.2 mL/min (by C-G formula based on SCr of 0.67 mg/dL). Liver Function Tests: Recent Labs  Lab 05/13/23 0924  AST 25  ALT 18  ALKPHOS 100  BILITOT 1.6*  PROT 6.6  ALBUMIN 3.3*   No results for input(s): LIPASE, AMYLASE in the last 168 hours. No results for input(s): AMMONIA in the last 168 hours. Coagulation Profile: No results  for input(s): INR, PROTIME in the last 168 hours. Cardiac Enzymes: No results for input(s): CKTOTAL, CKMB, CKMBINDEX, TROPONINI in the last 168 hours. BNP (last 3 results) No results for input(s): PROBNP in the last 8760 hours. HbA1C: No results for input(s): HGBA1C in the last 72 hours. CBG: No results for input(s): GLUCAP in the last 168 hours. Lipid Profile: No results for input(s): CHOL, HDL, LDLCALC, TRIG, CHOLHDL, LDLDIRECT in the last 72 hours.  Thyroid  Function Tests: No results for input(s): TSH, T4TOTAL, FREET4, T3FREE, THYROIDAB in the last  72 hours. Anemia Panel: No results for input(s): VITAMINB12, FOLATE, FERRITIN, TIBC, IRON, RETICCTPCT in the last 72 hours. Most Recent Urinalysis On File:     Component Value Date/Time   COLORURINE YELLOW (A) 05/14/2023 1508   APPEARANCEUR TURBID (A) 05/14/2023 1508   APPEARANCEUR Clear 10/24/2011 2127   LABSPEC 1.031 (H) 05/14/2023 1508   LABSPEC 1.003 10/24/2011 2127   PHURINE 6.0 05/14/2023 1508   GLUCOSEU NEGATIVE 05/14/2023 1508   GLUCOSEU Negative 10/24/2011 2127   HGBUR MODERATE (A) 05/14/2023 1508   BILIRUBINUR NEGATIVE 05/14/2023 1508   BILIRUBINUR Negative 10/24/2011 2127   KETONESUR 5 (A) 05/14/2023 1508   PROTEINUR 100 (A) 05/14/2023 1508   NITRITE NEGATIVE 05/14/2023 1508   LEUKOCYTESUR LARGE (A) 05/14/2023 1508   LEUKOCYTESUR Negative 10/24/2011 2127   Sepsis Labs: @LABRCNTIP (procalcitonin:4,lacticidven:4) Microbiology: Recent Results (from the past 240 hours)  Urine Culture     Status: Abnormal   Collection Time: 05/14/23  3:08 PM   Specimen: Urine, Clean Catch  Result Value Ref Range Status   Specimen Description   Final    URINE, CLEAN CATCH Performed at [facility], [address] [place], [place] [address]    Special Requests   Final    NONE Performed at [facility], [address], [place], [place] [address]    Culture  >=100,000 COLONIES/mL PROTEUS MIRABILIS (A)  Final   Report Status 05/17/2023 FINAL  Final   Organism ID, Bacteria PROTEUS MIRABILIS (A)  Final      Susceptibility   Proteus mirabilis - MIC*    AMPICILLIN <=2 SENSITIVE Sensitive     CEFAZOLIN  8 SENSITIVE Sensitive     CEFEPIME <=0.12 SENSITIVE Sensitive     CEFTRIAXONE  <=0.25 SENSITIVE Sensitive     CIPROFLOXACIN  <=0.25 SENSITIVE Sensitive     GENTAMICIN <=1 SENSITIVE Sensitive     IMIPENEM 2 SENSITIVE Sensitive     NITROFURANTOIN 128 RESISTANT Resistant     TRIMETH/SULFA <=20 SENSITIVE Sensitive     AMPICILLIN/SULBACTAM <=2 SENSITIVE Sensitive     PIP/TAZO <=4 SENSITIVE Sensitive ug/mL    * >=100,000 COLONIES/mL PROTEUS MIRABILIS  Blood culture (routine x 2)     Status: Abnormal   Collection Time: 05/14/23  5:40 PM   Specimen: BLOOD  Result Value Ref Range Status   Specimen Description   Final    BLOOD BLOOD RIGHT HAND Performed at [facility], [address] [place], [place] [address]    Special Requests   Final    BOTTLES DRAWN AEROBIC AND ANAEROBIC Blood Culture results may not be optimal due to an inadequate volume of blood received in culture bottles Performed at [facility], [address] [place], [place] [address]    Culture  Setup Time   Final    GRAM POSITIVE COCCI AEROBIC BOTTLE ONLY Organism ID to follow CRITICAL RESULT CALLED TO, READ BACK BY AND VERIFIED WITH: NATHAN BELUE PHARMD @1903  05/15/23 ASW    Culture (A)  Final    STAPHYLOCOCCUS EPIDERMIDIS THE SIGNIFICANCE OF ISOLATING THIS ORGANISM FROM A SINGLE SET OF BLOOD CULTURES WHEN MULTIPLE SETS ARE DRAWN IS UNCERTAIN. PLEASE NOTIFY THE MICROBIOLOGY DEPARTMENT WITHIN ONE WEEK IF SPECIATION AND SENSITIVITIES ARE REQUIRED. Performed at [facility], [address], [place], [place] [address]    Report Status 05/17/2023 FINAL  Final  SARS Coronavirus 2 by RT PCR (hospital order, performed in [facility] hospital lab) *cepheid single  result test* Anterior Nasal Swab     Status:  None   Collection Time: 05/14/23  5:40 PM   Specimen: Anterior Nasal Swab  Result Value Ref Range Status   SARS Coronavirus 2 by RT PCR NEGATIVE NEGATIVE Final    Comment: (NOTE) SARS-CoV-2 target nucleic acids are NOT DETECTED.  The SARS-CoV-2 RNA is generally detectable in upper and lower respiratory specimens during the acute phase of infection. The lowest concentration of SARS-CoV-2 viral copies this assay can detect is 250 copies / mL. A negative result does not preclude SARS-CoV-2 infection and should not be used as the sole basis for treatment or other patient management decisions.  A negative result may occur with improper specimen collection / handling, submission of specimen other than nasopharyngeal swab, presence of viral mutation(s) within the areas targeted by this assay, and inadequate number of viral copies (<250 copies / mL). A negative result must be combined with clinical observations, patient history, and epidemiological information.  Fact Sheet for Patients:   roadlaptop.co.za  Fact Sheet for Healthcare Providers: http://kim-miller.com/  This test is not yet approved or  cleared by the [place]  FDA and has been authorized for detection and/or diagnosis of SARS-CoV-2 by FDA under an Emergency Use Authorization (EUA).  This EUA will remain in effect (meaning this test can be used) for the duration of the COVID-19 declaration under Section 564(b)(1) of the Act, 21 U.S.C. section 360bbb-3(b)(1), unless the authorization is terminated or revoked sooner.  Performed at [facility], [address] [place], [place] [address]   Blood Culture ID Panel (Reflexed)     Status: Abnormal   Collection Time: 05/14/23  5:40 PM  Result Value Ref Range Status   Enterococcus faecalis NOT DETECTED NOT DETECTED Final   Enterococcus Faecium NOT DETECTED NOT DETECTED Final    Listeria monocytogenes NOT DETECTED NOT DETECTED Final   Staphylococcus species DETECTED (A) NOT DETECTED Final    Comment: CRITICAL RESULT CALLED TO, READ BACK BY AND VERIFIED WITH: NATHAN BELUE PHARMD @1903  05/15/23 ASW    Staphylococcus aureus (BCID) NOT DETECTED NOT DETECTED Final   Staphylococcus epidermidis DETECTED (A) NOT DETECTED Final    Comment: CRITICAL RESULT CALLED TO, READ BACK BY AND VERIFIED WITH: NATHAN BELUE PHARMD @1903  05/15/23 ASW    Staphylococcus lugdunensis NOT DETECTED NOT DETECTED Final   Streptococcus species NOT DETECTED NOT DETECTED Final   Streptococcus agalactiae NOT DETECTED NOT DETECTED Final   Streptococcus pneumoniae NOT DETECTED NOT DETECTED Final   Streptococcus pyogenes NOT DETECTED NOT DETECTED Final   A.calcoaceticus-baumannii NOT DETECTED NOT DETECTED Final   Bacteroides fragilis NOT DETECTED NOT DETECTED Final   Enterobacterales NOT DETECTED NOT DETECTED Final   Enterobacter cloacae complex NOT DETECTED NOT DETECTED Final   Escherichia coli NOT DETECTED NOT DETECTED Final   Klebsiella aerogenes NOT DETECTED NOT DETECTED Final   Klebsiella oxytoca NOT DETECTED NOT DETECTED Final   Klebsiella pneumoniae NOT DETECTED NOT DETECTED Final   Proteus species NOT DETECTED NOT DETECTED Final   Salmonella species NOT DETECTED NOT DETECTED Final   Serratia marcescens NOT DETECTED NOT DETECTED Final   Haemophilus influenzae NOT DETECTED NOT DETECTED Final   Neisseria meningitidis NOT DETECTED NOT DETECTED Final   Pseudomonas aeruginosa NOT DETECTED NOT DETECTED Final   Stenotrophomonas maltophilia NOT DETECTED NOT DETECTED Final   Candida albicans NOT DETECTED NOT DETECTED Final   Candida auris NOT DETECTED NOT DETECTED Final   Candida glabrata NOT DETECTED NOT DETECTED Final   Candida krusei NOT DETECTED NOT DETECTED Final   Candida parapsilosis  NOT DETECTED NOT DETECTED Final   Candida tropicalis NOT DETECTED NOT DETECTED Final   Cryptococcus  neoformans/gattii NOT DETECTED NOT DETECTED Final   Methicillin resistance mecA/C NOT DETECTED NOT DETECTED Final    Comment: Performed at [facility], [address] [place], [place] [address]  Blood culture (routine x 2)     Status: None   Collection Time: 05/14/23  5:45 PM   Specimen: BLOOD  Result Value Ref Range Status   Specimen Description BLOOD BLOOD LEFT HAND  Final   Special Requests   Final    BOTTLES DRAWN AEROBIC AND ANAEROBIC Blood Culture results may not be optimal due to an inadequate volume of blood received in culture bottles   Culture   Final    NO GROWTH 5 DAYS Performed at [facility], [address], [place], [place] [address]    Report Status 05/19/2023 FINAL  Final  MRSA Next Gen by PCR, Nasal     Status: None   Collection Time: 05/15/23  1:53 AM   Specimen: Nasal Mucosa; Nasal Swab  Result Value Ref Range Status   MRSA by PCR Next Gen NOT DETECTED NOT DETECTED Final    Comment: (NOTE) The GeneXpert MRSA Assay (FDA approved for NASAL specimens only), is one component of a comprehensive MRSA colonization surveillance program. It is not intended to diagnose MRSA infection nor to guide or monitor treatment for MRSA infections. Test performance is not FDA approved in patients less than 71 years old. Performed at [facility], [address] [place], [place] [address]   Respiratory (~20 pathogens) panel by PCR     Status: None   Collection Time: 05/15/23  1:53 AM   Specimen: Nasal Mucosa  Result Value Ref Range Status   Adenovirus NOT DETECTED NOT DETECTED Final   Coronavirus 229E NOT DETECTED NOT DETECTED Final    Comment: (NOTE) The Coronavirus on the Respiratory Panel, DOES NOT test for the novel  Coronavirus (2019 nCoV)    Coronavirus HKU1 NOT DETECTED NOT DETECTED Final   Coronavirus NL63 NOT DETECTED NOT DETECTED Final   Coronavirus OC43 NOT DETECTED NOT DETECTED Final   Metapneumovirus NOT DETECTED NOT DETECTED  Final   Rhinovirus / Enterovirus NOT DETECTED NOT DETECTED Final   Influenza A NOT DETECTED NOT DETECTED Final   Influenza B NOT DETECTED NOT DETECTED Final   Parainfluenza Virus 1 NOT DETECTED NOT DETECTED Final   Parainfluenza Virus 2 NOT DETECTED NOT DETECTED Final   Parainfluenza Virus 3 NOT DETECTED NOT DETECTED Final   Parainfluenza Virus 4 NOT DETECTED NOT DETECTED Final   Respiratory Syncytial Virus NOT DETECTED NOT DETECTED Final   Bordetella pertussis NOT DETECTED NOT DETECTED Final   Bordetella Parapertussis NOT DETECTED NOT DETECTED Final   Chlamydophila pneumoniae NOT DETECTED NOT DETECTED Final   Mycoplasma pneumoniae NOT DETECTED NOT DETECTED Final    Comment: Performed at [facility], [address] [place], [place] [address]      Radiology Studies last 3 days: No results found.        Ellery Meroney, DO [facility] 05/19/2023, 3:31 PM    Dictation software may have been used to generate the above note. Typos may occur and escape review in typed/dictated notes. Please contact Dr Marsa directly for clarity if needed.  Staff may message me via secure chat in Epic  but this may not receive an immediate response,  please page me for urgent matters!  If 7PM-7AM, please contact  night coverage [email or web address]

## 2023-05-19 NOTE — Plan of Care (Signed)

## 2023-05-20 DIAGNOSIS — E861 Hypovolemia: Secondary | ICD-10-CM | POA: Diagnosis not present

## 2023-05-20 LAB — CBC
HCT: 37.6 % — ABNORMAL LOW (ref 39.0–52.0)
Hemoglobin: 12.7 g/dL — ABNORMAL LOW (ref 13.0–17.0)
MCH: 31.1 pg (ref 26.0–34.0)
MCHC: 33.8 g/dL (ref 30.0–36.0)
MCV: 91.9 fL (ref 80.0–100.0)
Platelets: 171 10*3/uL (ref 150–400)
RBC: 4.09 MIL/uL — ABNORMAL LOW (ref 4.22–5.81)
RDW: 13.7 % (ref 11.5–15.5)
WBC: 6.4 10*3/uL (ref 4.0–10.5)
nRBC: 0 % (ref 0.0–0.2)

## 2023-05-20 LAB — BASIC METABOLIC PANEL
Anion gap: 10 (ref 5–15)
BUN: 18 mg/dL (ref 8–23)
CO2: 27 mmol/L (ref 22–32)
Calcium: 8.8 mg/dL — ABNORMAL LOW (ref 8.9–10.3)
Chloride: 99 mmol/L (ref 98–111)
Creatinine, Ser: 0.59 mg/dL — ABNORMAL LOW (ref 0.61–1.24)
GFR, Estimated: >60 mL/min (ref >60)
Glucose, Bld: 95 mg/dL (ref 70–99)
Potassium: 4.5 mmol/L (ref 3.5–5.1)
Sodium: 136 mmol/L (ref 135–145)

## 2023-05-20 MED ORDER — FLEET ENEMA RE ENEM
1.0000 | ENEMA | Freq: Once | RECTAL | Status: AC
  Administered 2023-05-20: 1 via RECTAL

## 2023-05-20 MED ORDER — ACETAMINOPHEN 500 MG PO TABS: 1000.0000 mg | ORAL_TABLET | Freq: Three times a day (TID) | ORAL | Status: DC | PRN

## 2023-05-20 MED ORDER — POLYETHYLENE GLYCOL 3350 17 G PO PACK
34.0000 g | PACK | Freq: Two times a day (BID) | ORAL | Status: DC
  Administered 2023-05-20 – 2023-05-21 (×4): 34 g via ORAL
  Filled 2023-05-20 (×4): qty 2

## 2023-05-20 NOTE — Progress Notes (Signed)
 Mobility Specialist - Progress Note   05/20/23 1500  Mobility  Activity Ambulated with assistance in room;Transferred from bed to chair  Level of Assistance Contact guard assist, steadying assist  Assistive Device Front wheel walker  Distance Ambulated (ft) 6 ft  Activity Response Tolerated well  Mobility visit 1 Mobility     Pt lying in bed upon arrival, utilizing RA. Pt agreeable to activity, but a little hesitant d/t weakness and required encouragement. Pt completed bed mobility with modA. STS and ambulation with minA progressing to CGA. No LOB. Post lean in standing. Pt fearful of falling and needs extra time + VC for sequencing steps. Pt left in chair with alarm set, needs in reach.    Lennette Seip Mobility Specialist 05/20/23, 3:14 PM

## 2023-05-20 NOTE — Progress Notes (Signed)
  PROGRESS NOTE    Scott Cortez  FMW:[id number] DOB: April 29, 1934 DOA: 05/13/2023 PCP: [facility]  [id number]  LOS: 6 days   Brief hospital course: [name] is an 89 year old male with history of atrial fibrillation on Eliquis , Parkinson's, hyperlipidemia, hypertension, dementia, who presents from home to the emergency department for chief concerns of weakness.   Hypotensive in ED, admitted to hospitalist service. Infectious workup so far negative.   Patient is debilitated, seen by PT advised SNF placement.  Placement pending   Assessment & Plan:  Hypotension Secondary to hypovolemia due to poor PO intake in setting of depression Infectious workup negative  Hold antihypertensive medications.    Lactic acidosis  Repeat one normal.    UTI, proteus  UA 12/31 --completed 3 days of ceftriaxone    Physical deconditioning Frequent falls Fall precaution. PT/ OT evaluation. TOC working on SNF placement    Constipation, fecal impaction Disimpacted 05/19/23 --cont bowel regimen   Atrial fibrillation  Eliquis  5 mg p.o. twice daily  --hold coreg  due to hypotension   Poor oral intake --supplements per dietician   Parkinson disease Has baseline hand tremors. Need assistance with feeding. --cont Sinemet .   BPH with obstruction/lower urinary tract symptoms Continue flomax .   Depression Behavioral health consult appreciated, no further work up nor meds recommended.   Restless leg syndrome Cont Ropinirole  0.25 mg nightly    Hyperlipidemia Continue atorvastatin   Hyponatremia --mild, of unclear significance   Borderline underweight based on BMI: Body mass index is 19.68 kg/m.  Significantly low or high BMI is associated with higher medical risk.  Weight management advised as adjunct to other disease management and risk reduction treatments    DVT prophylaxis: On:Eliquis  Code Status: DNR  Family Communication:  Level of care:  Telemetry Medical Dispo:   The patient is from: home Anticipated d/c is to: SNF rehab Anticipated d/c date is: whenever bed available   Subjective and Interval History:  Pt reported having to strain to pass stool.   Objective: Vitals:   05/19/23 2018 05/20/23 0454 05/20/23 0841 05/20/23 1649  BP: 103/70 (!) 96/59 100/67 (!) 110/43  Pulse: 82 71 72 80  Resp: 18 18 16 18   Temp: 98 F (36.7 C) 98 F (36.7 C) 97.9 F (36.6 C) 98.2 F (36.8 C)  TempSrc:  Oral Oral   SpO2: 100% 98% 98% 98%  Weight:      Height:        Intake/Output Summary (Last 24 hours) at 05/20/2023 1948 Last data filed at 05/20/2023 0700 Gross per 24 hour  Intake 320 ml  Output 1050 ml  Net -730 ml   Filed Weights   05/12/23 2039 05/14/23 2348  Weight: 66 kg 62.2 kg    Examination:   Constitutional: NAD, alert HEENT: conjunctivae and lids normal, EOMI CV: No cyanosis.   RESP: normal respiratory effort, on RA Neuro: II - XII grossly intact.   Psych: Normal mood and affect.     Data Reviewed: I have personally reviewed labs and imaging studies  Time spent: 50 minutes  Ellouise Haber, MD [facility] If 7PM-7AM, please contact night-coverage 05/20/2023, 7:48 PM

## 2023-05-20 NOTE — Plan of Care (Signed)

## 2023-05-21 DIAGNOSIS — E861 Hypovolemia: Secondary | ICD-10-CM | POA: Diagnosis not present

## 2023-05-21 NOTE — Progress Notes (Signed)
 Mobility Specialist - Progress Note   05/21/23 1144  Mobility  Activity Ambulated with assistance in room;Stood at bedside;Transferred from bed to chair  Level of Assistance Minimal assist, patient does 75% or more  Assistive Device Front wheel walker  Distance Ambulated (ft) 8 ft  Activity Response Tolerated well  Mobility visit 1 Mobility     Pt lying in bed on arrival, utilizing RA. Pt agreeable to activity. Pt completed bed mobility with modA and cues for hand placement with use of bed rails. STS from elevated surface with minA. Post lean in standing that is temporarily corrected with time + cueing. Pt took several steps towards chair and completed BLE therex before voicing need to void. Pt placed on bedpan and instructed to use call bell when finished. Chair alarm set, NT notified.    Lennette Seip Mobility Specialist 05/21/23, 11:48 AM

## 2023-05-21 NOTE — Progress Notes (Signed)
 PT Cancellation Note  Patient Details Name: Scott Cortez MRN: [id number] DOB: 01-Nov-1933   Cancelled Treatment:     Pt declined stating he sat up for several hours after Mobility Specialist assisted him up and was currently too fatigued to tolerate skilled PT. Continue PT per POC.   Darice JAYSON Bohr 05/21/2023, 4:15 PM

## 2023-05-21 NOTE — Plan of Care (Signed)
  Problem: Clinical Measurements: Goal: Will remain free from infection Outcome: Progressing Goal: Diagnostic test results will improve Outcome: Progressing Goal: Respiratory complications will improve Outcome: Progressing Goal: Cardiovascular complication will be avoided Outcome: Progressing   Problem: Nutrition: Goal: Adequate nutrition will be maintained Outcome: Progressing   Problem: Elimination: Goal: Will not experience complications related to bowel motility Outcome: Progressing Goal: Will not experience complications related to urinary retention Outcome: Progressing   Problem: Pain Management: Goal: General experience of comfort will improve Outcome: Progressing   Problem: Safety: Goal: Ability to remain free from injury will improve Outcome: Progressing   Problem: Skin Integrity: Goal: Risk for impaired skin integrity will decrease Outcome: Progressing

## 2023-05-21 NOTE — TOC Progression Note (Signed)
 Transition of Care [facility]) - Progression Note    Patient Details  Name: Scott Cortez MRN: [id number] Date of Birth: [birth year]  Transition [facility]) CM/SW Contact  Corean ONEIDA Haddock, RN Phone Number: 05/21/2023, 4:11 PM  Clinical Narrative:     Followed with with Daughter Suzen on bed offers Of the facilities that have offered her preferences are as follows [facility],  [facility]    She request that I check with the following SNF in [place] - VM left for [employer] - per Tor[facility] no beds available [place] - VM left for [facility] with patient at bedside.  He states that he doesn't have a preference where he goes just as long as he is warm and fed He request that I follow up with Suzen Suzen notified of the above information. Plan to move forward with [facility] tomorrow, unless one of the facilities in [place] is able to offer  Accepted in [place] and notified Tammy at [place]      Expected Discharge Plan and Services                                               Social Determinants of Health (SDOH) Interventions SDOH Screenings   Food Insecurity: No Food Insecurity (05/15/2023)  Housing: Low Risk  (05/15/2023)  Transportation Needs: Unmet Transportation Needs (05/15/2023)  Utilities: Not At Risk (05/15/2023)  Social Connections: Socially Isolated (05/15/2023)  Tobacco Use: Medium Risk (05/14/2023)    Readmission Risk Interventions    05/16/2023    4:05 PM  Readmission Risk Prevention Plan  Transportation Screening Complete  Home Care Screening Complete  Medication Review (RN CM) Complete

## 2023-05-21 NOTE — Progress Notes (Signed)
  PROGRESS NOTE    Scott Cortez  FMW:[id number] DOB: [birth year] DOA: 05/13/2023 PCP: [facility]  [id number]  LOS: 7 days   Brief hospital course: Scott Cortez is an 89 year old male with history of atrial fibrillation on Eliquis , Parkinson's, hyperlipidemia, hypertension, dementia, who presents from home to the emergency department for chief concerns of weakness.   Hypotensive in ED, admitted to hospitalist service. Infectious workup so far negative.   Patient is debilitated, seen by PT advised SNF placement.  Placement pending   Assessment & Plan:  Hypotension Secondary to hypovolemia due to poor PO intake in setting of depression Infectious workup negative  Hold antihypertensive medications.    Lactic acidosis  Repeat one normal.    UTI, proteus  UA 12/31 --completed 3 days of ceftriaxone    Physical deconditioning Frequent falls Fall precaution. PT/ OT evaluation. TOC working on SNF placement    Constipation, fecal impaction Disimpacted 05/19/23 --cont bowel regimen   Atrial fibrillation  Eliquis  5 mg p.o. twice daily  --hold coreg  due to hypotension   Poor oral intake --supplements per dietician   Parkinson disease Has baseline hand tremors. Need assistance with feeding. --cont Sinemet    BPH with obstruction/lower urinary tract symptoms --cont flomax    Depression Behavioral health consult appreciated, no further work up nor meds recommended.   Restless leg syndrome Cont Ropinirole  0.25 mg nightly    Hyperlipidemia --cont statin  Hyponatremia --mild, of unclear significance   Borderline underweight based on BMI: Body mass index is 19.68 kg/m.  Significantly low or high BMI is associated with higher medical risk.  Weight management advised as adjunct to other disease management and risk reduction treatments    DVT prophylaxis: On:Eliquis  Code Status: DNR  Family Communication:  Level of care: Telemetry  Medical Dispo:   The patient is from: home Anticipated d/c is to: SNF rehab Anticipated d/c date is: whenever bed available, likely tomorrow   Subjective and Interval History:  No new complaint today.  Pt was feeding himself.   Objective: Vitals:   05/21/23 0444 05/21/23 0809 05/21/23 1440 05/21/23 1712  BP: 112/70 94/72 (!) 85/55 94/63  Pulse: 96 82 74   Resp: 20 16 16    Temp: 97.8 F (36.6 C) 97.9 F (36.6 C) 98.2 F (36.8 C)   TempSrc: Oral  Oral   SpO2: 98% 99% 98%   Weight:      Height:        Intake/Output Summary (Last 24 hours) at 05/21/2023 1917 Last data filed at 05/21/2023 1342 Gross per 24 hour  Intake --  Output 850 ml  Net -850 ml   Filed Weights   05/12/23 2039 05/14/23 2348  Weight: 66 kg 62.2 kg    Examination:   Constitutional: NAD, alert HEENT: conjunctivae and lids normal, EOMI CV: No cyanosis.   RESP: normal respiratory effort, on RA Extremities: No effusions, edema in BLE SKIN: warm, dry Neuro: II - XII grossly intact.   Psych: Normal mood and affect.     Data Reviewed: I have personally reviewed labs and imaging studies  Time spent: 35 minutes  Ellouise Haber, MD [facility] If 7PM-7AM, please contact night-coverage 05/21/2023, 7:17 PM

## 2023-05-22 DIAGNOSIS — E861 Hypovolemia: Secondary | ICD-10-CM | POA: Diagnosis not present

## 2023-05-22 MED ORDER — POLYETHYLENE GLYCOL 3350 17 G PO PACK: 17.0000 g | PACK | Freq: Every day | ORAL | Status: AC

## 2023-05-22 MED ORDER — TAMSULOSIN HCL 0.4 MG PO CAPS: 0.4000 mg | ORAL_CAPSULE | Freq: Every day | ORAL | Status: AC

## 2023-05-22 NOTE — Progress Notes (Signed)
 Physical Therapy Treatment Patient Details Name: Scott Cortez MRN: [id number] DOB: 10/19/1933 Today's Date: 05/22/2023   History of Present Illness Pt is an 89 yo male that presented to ED for complaints of constipation but is unable to recall why he was at the ED later. Did recently have a stay at a rehab facility per family report, and has been increasingly weak at home. PMH of hypertension, atrial fibrillation on Eliquis , CAD, CHF, AAA, Parkinson's disease, and dementia.    PT Comments  Pt in bed on entry, awake, agreeable to session. Pt requires physical heavy physical assist for bed mobility due to trunk disequilibrium issue and min-modA assist for transfers due to leg weakness. We spend lots of time at EOB facilitating trunk neutral and tasks which required A/ROM of trunk in a short sitting position. Pt remains anxious about possible anterior LOB to floor, but appears fairly stable. Pt very pleased with transition to recliner, author sets up meal tray as requested. All needs met.    If plan is discharge home, recommend the following: A little help with walking and/or transfers;A little help with bathing/dressing/bathroom;Assistance with cooking/housework;Direct supervision/assist for medications management;Direct supervision/assist for financial management;Assist for transportation;Help with stairs or ramp for entrance;Supervision due to cognitive status   Can travel by private vehicle     No  Equipment Recommendations  Other (comment)    Recommendations for Other Services       Precautions / Restrictions Precautions Precautions: Fall Restrictions Weight Bearing Restrictions Per Provider Order: No     Mobility  Bed Mobility Overal bed mobility: Needs Assistance Bed Mobility: Supine to Sit     Supine to sit: Mod assist, HOB elevated, Used rails     General bed mobility comments: very dependent on holding onto author to get to EOB and thereafter for >5 minutes,  disequilibrium of trunk persists and fearful of falling forward    Transfers Overall transfer level: Needs assistance Equipment used: Rolling walker (2 wheels) Transfers: Sit to/from Stand Sit to Stand: Min assist, From elevated surface   Step pivot transfers: Min assist, +2 safety/equipment       General transfer comment: adeuqate use of RW for support but legs remain incredibly weak in open and closed chain use    Ambulation/Gait Ambulation/Gait assistance:  (deferred at this time, pt nor ready)                 Stairs             Wheelchair Mobility     Tilt Bed    Modified Rankin (Stroke Patients Only)       Balance                                            Cognition Arousal: Alert Behavior During Therapy: WFL for tasks assessed/performed Overall Cognitive Status: History of cognitive impairments - at baseline                                 General Comments: more oriented to situation during today's visit        Exercises      General Comments        Pertinent Vitals/Pain Pain Assessment Pain Assessment: No/denies pain    Home Living  Prior Function            PT Goals (current goals can now be found in the care plan section) Acute Rehab PT Goals Patient Stated Goal: none stated PT Goal Formulation: With patient Time For Goal Achievement: 05/27/23 Potential to Achieve Goals: Fair Progress towards PT goals: Progressing toward goals    Frequency    Min 1X/week      PT Plan      Co-evaluation              AM-PAC PT 6 Clicks Mobility   Outcome Measure  Help needed turning from your back to your side while in a flat bed without using bedrails?: A Lot Help needed moving from lying on your back to sitting on the side of a flat bed without using bedrails?: A Lot Help needed moving to and from a bed to a chair (including a wheelchair)?: A  Lot Help needed standing up from a chair using your arms (e.g., wheelchair or bedside chair)?: A Lot Help needed to walk in hospital room?: A Lot Help needed climbing 3-5 steps with a railing? : A Lot 6 Click Score: 12    End of Session   Activity Tolerance: Patient tolerated treatment well;Patient limited by fatigue;No increased pain Patient left: in chair;with call bell/phone within reach;with chair alarm set Nurse Communication: Mobility status PT Visit Diagnosis: Other abnormalities of gait and mobility (R26.89);Difficulty in walking, not elsewhere classified (R26.2);Muscle weakness (generalized) (M62.81)     Time: 9097-9073 PT Time Calculation (min) (ACUTE ONLY): 24 min  Charges:    $Therapeutic Activity: 23-37 mins PT General Charges $$ ACUTE PT VISIT: 1 Visit                    3:03 PM, 05/22/23 Peggye JAYSON Linear, PT, DPT Physical Therapist - [facility]  [phone] (ASCOM)   Scott Cortez C 05/22/2023, 3:01 PM

## 2023-05-22 NOTE — Progress Notes (Signed)
 Occupational Therapy Treatment Patient Details Name: Scott Cortez MRN: [id number] DOB: October 14, 1933 Today's Date: 05/22/2023   History of present illness Pt is an 89 yo male that presented to ED for complaints of constipation but is unable to recall why he was at the ED later. Did recently have a stay at a rehab facility per family report, and has been increasingly weak at home. PMH of hypertension, atrial fibrillation on Eliquis , CAD, CHF, AAA, Parkinson's disease, and dementia.   OT comments  Pt is supine in bed on arrival. Pleasant, alert, and agreeable to OT session. He reports pain in his peri-area from the purewick tape-nurse notified. Pt requiring increased time and motivation to participate. He needed Mod A and cueing for hand placement on bed rail. Required BUE support on bed to maintain seated balance and Min to CGA. STS performed from EOB with bed height elevated requiring Min A, then pt able to step pivot to the recliner using RW with increased time and cueing for sequencing. Nurse on standy by as 2nd assist if needed. Pt was fatigued after session and continues to have fear of falling. Pt left seated in recliner with all needs in place and will cont to require skilled acute OT services to maximize his safety and IND to return to PLOF.       If plan is discharge home, recommend the following:  A lot of help with bathing/dressing/bathroom;Direct supervision/assist for medications management;Supervision due to cognitive status;Direct supervision/assist for financial management;Assistance with cooking/housework;Assist for transportation;Help with stairs or ramp for entrance;A lot of help with walking and/or transfers   Equipment Recommendations  Other (comment) (defer)    Recommendations for Other Services      Precautions / Restrictions Restrictions Weight Bearing Restrictions Per Provider Order: No       Mobility Bed Mobility Overal bed mobility: Needs Assistance Bed  Mobility: Supine to Sit     Supine to sit: Mod assist, HOB elevated, Used rails          Transfers Overall transfer level: Needs assistance Equipment used: Rolling walker (2 wheels) Transfers: Sit to/from Stand, Bed to chair/wheelchair/BSC Sit to Stand: Min assist, From elevated surface     Step pivot transfers: Min assist, Mod assist, +2 safety/equipment     General transfer comment: nurse present as +2 for safety, but no physical assist needed; Min/Mod A x1 for STS and SPT     Balance Overall balance assessment: Needs assistance Sitting-balance support: Feet supported, Bilateral upper extremity supported Sitting balance-Leahy Scale: Fair Sitting balance - Comments: able to sit static with Min/CGA on EOB   Standing balance support: Bilateral upper extremity supported, Reliant on assistive device for balance Standing balance-Leahy Scale: Fair Standing balance comment: reliant on RW for all dynamic standing activity                           ADL either performed or assessed with clinical judgement   ADL                                              Extremity/Trunk Assessment              Vision       Perception     Praxis      Cognition Arousal: Alert Behavior During Therapy: [name] for tasks assessed/performed  Overall Cognitive Status: No family/caregiver present to determine baseline cognitive functioning                                 General Comments: more oriented to situation during today's visit        Exercises      Shoulder Instructions       General Comments      Pertinent Vitals/ Pain       Pain Assessment Pain Assessment: Faces Faces Pain Scale: Hurts little more Pain Location: purewick area Pain Descriptors / Indicators: Grimacing Pain Intervention(s): Monitored during session, Repositioned  Home Living                                          Prior  Functioning/Environment              Frequency  Min 1X/week        Progress Toward Goals  OT Goals(current goals can now be found in the care plan section)  Progress towards OT goals: Progressing toward goals  Acute Rehab OT Goals Patient Stated Goal: improve strength OT Goal Formulation: With patient Time For Goal Achievement: 05/28/23 Potential to Achieve Goals: Fair  Plan      Co-evaluation                 AM-PAC OT 6 Clicks Daily Activity     Outcome Measure   Help from another person eating meals?: None Help from another person taking care of personal grooming?: A Little Help from another person toileting, which includes using toliet, bedpan, or urinal?: A Lot Help from another person bathing (including washing, rinsing, drying)?: A Lot Help from another person to put on and taking off regular upper body clothing?: A Lot Help from another person to put on and taking off regular lower body clothing?: A Lot 6 Click Score: 15    End of Session Equipment Utilized During Treatment: Gait belt;Rolling walker (2 wheels)  OT Visit Diagnosis: Other abnormalities of gait and mobility (R26.89);Muscle weakness (generalized) (M62.81)   Activity Tolerance Patient tolerated treatment well   Patient Left in chair;with call bell/phone within reach;with chair alarm set;with nursing/sitter in room   Nurse Communication Mobility status        Time: 8894-8874 OT Time Calculation (min): 20 min  Charges: OT General Charges $OT Visit: 1 Visit OT Treatments $Therapeutic Activity: 8-22 mins  Rorik Vespa, OTR/L  05/22/23, 12:27 PM   Derenda Giddings E Jeyson Deshotel 05/22/2023, 12:18 PM

## 2023-05-22 NOTE — Discharge Summary (Signed)
 Physician Discharge Summary   Scott Cortez  male DOB: 1933-11-30  FMW:[id number]  PCP: [facility]  Admit date: 05/13/2023 Discharge date: 05/22/2023  Admitted From: home Disposition:  [facility] rehab CODE STATUS: DNR   Hospital Course:  For full details, please see H&P, progress notes, consult notes and ancillary notes.  Briefly,  [name] is an 89 year old male with history of atrial fibrillation on Eliquis , Parkinson's, hyperlipidemia, hypertension, dementia, who presented from home to the emergency department for chief concerns of weakness.   Hypotension Secondary to hypovolemia due to poor PO intake in setting of depression Infectious workup negative  --not on antihypertensives PTA   Lactic acidosis  Repeat normal.    UTI, proteus  --completed 3 days of ceftriaxone    Physical deconditioning Frequent falls --PT/OT   Constipation, fecal impaction Disimpacted 05/19/23 --received scheduled Miralax .  Should at least receive Miralax  daily after discharge to prevent constipation.   Atrial fibrillation  --not on rate control agent PTA --cont Eliquis  5 mg p.o. twice daily    Poor oral intake Severe malnutrition in context of chronic illness  --supplements per dietician   Parkinson disease Has baseline hand tremors. Need assistance with feeding. --cont Sinemet    BPH with obstruction/lower urinary tract symptoms --cont flomax    Depression Behavioral health consult appreciated, no further work up nor meds recommended.   Restless leg syndrome Cont Ropinirole  0.25 mg nightly    Hyperlipidemia --cont statin   Hyponatremia --mild, of unclear significance    Discharge Diagnoses:  Principal Problem:   Hypotension Active Problems:   CAD (coronary artery disease)   General weakness   Frequent falls   Lactic acid blood increased   Hypertension   Atrial fibrillation (HCC)   Physical deconditioning   BPH with  obstruction/lower urinary tract symptoms   Parkinson disease (HCC)   NPH (normal pressure hydrocephalus) (HCC)   Protein-calorie malnutrition, severe   Hyperlipidemia   Restless leg syndrome   Encounter for psychiatric assessment   30 Day Unplanned Readmission Risk Score    Flowsheet Row ED to Hosp-Admission (Current) from 05/13/2023 in [facility]  30 Day Unplanned Readmission Risk Score (%) 25.03 Filed at 05/22/2023 0801       This score is the patient's risk of an unplanned readmission within 30 days of being discharged (0 -100%). The score is based on dignosis, age, lab data, medications, orders, and past utilization.   Low:  0-14.9   Medium: 15-21.9   High: 22-29.9   Extreme: 30 and above         Discharge Instructions:  Allergies as of 05/22/2023       Reactions   Pneumococcal Vaccines Swelling        Medication List     TAKE these medications    acetaminophen  500 MG tablet Commonly known as: TYLENOL  Take 1,000 mg by mouth every 8 (eight) hours as needed for mild pain.   apixaban  5 MG Tabs tablet Commonly known as: ELIQUIS  Take 1 tablet (5 mg total) by mouth 2 (two) times daily.   atorvastatin  20 MG tablet Commonly known as: LIPITOR Take 20 mg by mouth daily.   carbidopa -levodopa  50-200 MG tablet Commonly known as: SINEMET  CR Take 1 tablet by mouth at bedtime.   carbidopa -levodopa  25-100 MG tablet Commonly known as: SINEMET  IR Take 2 tablets by mouth 4 (four) times daily. At 0800, 1100, 1400, 1730   feeding supplement Liqd Take 237 mLs by mouth 3 (three)  times daily between meals.   multivitamin with minerals Tabs tablet Take 1 tablet by mouth daily.   polyethylene glycol 17 g packet Commonly known as: MIRALAX  / GLYCOLAX  Take 17 g by mouth daily. What changed:  when to take this reasons to take this   rOPINIRole  0.25 MG tablet Commonly known as: REQUIP  Take 0.25 mg by mouth at bedtime.   tamsulosin   0.4 MG Caps capsule Commonly known as: FLOMAX  Take 1 capsule (0.4 mg total) by mouth daily with supper. At 1700.  Home med. What changed:  when to take this additional instructions   thiamine  100 MG tablet Commonly known as: VITAMIN B1 Take 1 tablet (100 mg total) by mouth daily.         Contact information for follow-up providers     [facility] Follow up.   Specialty: General Practice Contact information: [address] [place] [address] [phone]              Contact information for after-discharge care     Destination     [facility] .   Service: Skilled Nursing Contact information: [address] [place]  [address] [phone]                     Allergies  Allergen Reactions   Pneumococcal Vaccines Swelling     The results of significant diagnostics from this hospitalization (including imaging, microbiology, ancillary and laboratory) are listed below for reference.   Consultations:   Procedures/Studies: DG Chest Port 1 View Result Date: 05/14/2023 CLINICAL DATA:  Weakness EXAM: PORTABLE CHEST 1 VIEW COMPARISON:  01/18/2023 chest radiograph. FINDINGS: Stable cardiomediastinal silhouette with mild cardiomegaly. No pneumothorax. No pleural effusion. Lungs appear clear, with no acute consolidative airspace disease and no pulmonary edema. IMPRESSION: Mild cardiomegaly. No active pulmonary disease.  No pulmonary edema. Electronically Signed   By: Selinda DELENA Blue M.D.   On: 05/14/2023 18:50      Labs: BNP (last 3 results) Recent Labs    01/18/23 1602  BNP 170.3*   Basic Metabolic Panel: Recent Labs  Lab 05/20/23 0534  NA 136  K 4.5  CL 99  CO2 27  GLUCOSE 95  BUN 18  CREATININE 0.59*  CALCIUM  8.8*   Liver Function Tests: No results for input(s): AST, ALT, ALKPHOS, BILITOT, PROT, ALBUMIN in the last 168 hours. No results for input(s): LIPASE, AMYLASE in  the last 168 hours. No results for input(s): AMMONIA in the last 168 hours. CBC: Recent Labs  Lab 05/20/23 0534  WBC 6.4  HGB 12.7*  HCT 37.6*  MCV 91.9  PLT 171   Cardiac Enzymes: No results for input(s): CKTOTAL, CKMB, CKMBINDEX, TROPONINI in the last 168 hours. BNP: Invalid input(s): POCBNP CBG: No results for input(s): GLUCAP in the last 168 hours. D-Dimer No results for input(s): DDIMER in the last 72 hours. Hgb A1c No results for input(s): HGBA1C in the last 72 hours. Lipid Profile No results for input(s): CHOL, HDL, LDLCALC, TRIG, CHOLHDL, LDLDIRECT in the last 72 hours. Thyroid  function studies No results for input(s): TSH, T4TOTAL, T3FREE, THYROIDAB in the last 72 hours.  Invalid input(s): FREET3 Anemia work up No results for input(s): VITAMINB12, FOLATE, FERRITIN, TIBC, IRON, RETICCTPCT in the last 72 hours. Urinalysis    Component Value Date/Time   COLORURINE YELLOW (A) 05/14/2023 1508   APPEARANCEUR TURBID (A) 05/14/2023 1508   APPEARANCEUR Clear 10/24/2011 2127   LABSPEC 1.031 (H) 05/14/2023 1508  LABSPEC 1.003 10/24/2011 2127   PHURINE 6.0 05/14/2023 1508   GLUCOSEU NEGATIVE 05/14/2023 1508   GLUCOSEU Negative 10/24/2011 2127   HGBUR MODERATE (A) 05/14/2023 1508   BILIRUBINUR NEGATIVE 05/14/2023 1508   BILIRUBINUR Negative 10/24/2011 2127   KETONESUR 5 (A) 05/14/2023 1508   PROTEINUR 100 (A) 05/14/2023 1508   NITRITE NEGATIVE 05/14/2023 1508   LEUKOCYTESUR LARGE (A) 05/14/2023 1508   LEUKOCYTESUR Negative 10/24/2011 2127   Sepsis Labs Recent Labs  Lab 05/20/23 0534  WBC 6.4   Microbiology Recent Results (from the past 240 hours)  Urine Culture     Status: Abnormal   Collection Time: 05/14/23  3:08 PM   Specimen: Urine, Clean Catch  Result Value Ref Range Status   Specimen Description   Final    URINE, CLEAN CATCH Performed at [facility], [address], [place], [place]  [address]    Special Requests   Final    NONE Performed at [facility], [address] [place], [place] [address]    Culture >=100,000 COLONIES/mL PROTEUS MIRABILIS (A)  Final   Report Status 05/17/2023 FINAL  Final   Organism ID, Bacteria PROTEUS MIRABILIS (A)  Final      Susceptibility   Proteus mirabilis - MIC*    AMPICILLIN <=2 SENSITIVE Sensitive     CEFAZOLIN  8 SENSITIVE Sensitive     CEFEPIME <=0.12 SENSITIVE Sensitive     CEFTRIAXONE  <=0.25 SENSITIVE Sensitive     CIPROFLOXACIN  <=0.25 SENSITIVE Sensitive     GENTAMICIN <=1 SENSITIVE Sensitive     IMIPENEM 2 SENSITIVE Sensitive     NITROFURANTOIN 128 RESISTANT Resistant     TRIMETH/SULFA <=20 SENSITIVE Sensitive     AMPICILLIN/SULBACTAM <=2 SENSITIVE Sensitive     PIP/TAZO <=4 SENSITIVE Sensitive ug/mL    * >=100,000 COLONIES/mL PROTEUS MIRABILIS  Blood culture (routine x 2)     Status: Abnormal   Collection Time: 05/14/23  5:40 PM   Specimen: BLOOD  Result Value Ref Range Status   Specimen Description   Final    BLOOD BLOOD RIGHT HAND Performed at [facility], [address], [place], [place] [address]    Special Requests   Final    BOTTLES DRAWN AEROBIC AND ANAEROBIC Blood Culture results may not be optimal due to an inadequate volume of blood received in culture bottles Performed at [facility], [address] [place], [place] [address]    Culture  Setup Time   Final    GRAM POSITIVE COCCI AEROBIC BOTTLE ONLY Organism ID to follow CRITICAL RESULT CALLED TO, READ BACK BY AND VERIFIED WITH: NATHAN BELUE PHARMD @1903  05/15/23 ASW    Culture (A)  Final    STAPHYLOCOCCUS EPIDERMIDIS THE SIGNIFICANCE OF ISOLATING THIS ORGANISM FROM A SINGLE SET OF BLOOD CULTURES WHEN MULTIPLE SETS ARE DRAWN IS UNCERTAIN. PLEASE NOTIFY THE MICROBIOLOGY DEPARTMENT WITHIN ONE WEEK IF SPECIATION AND SENSITIVITIES ARE REQUIRED. Performed at [facility], [address] [place], [place] [address]     Report Status 05/17/2023 FINAL  Final  SARS Coronavirus 2 by RT PCR (hospital order, performed in [facility] hospital lab) *cepheid single result test* Anterior Nasal Swab     Status: None   Collection Time: 05/14/23  5:40 PM   Specimen: Anterior Nasal Swab  Result Value Ref Range Status   SARS Coronavirus 2 by RT PCR NEGATIVE NEGATIVE Final    Comment: (NOTE) SARS-CoV-2 target nucleic acids are NOT DETECTED.  The SARS-CoV-2 RNA is generally  detectable in upper and lower respiratory specimens during the acute phase of infection. The lowest concentration of SARS-CoV-2 viral copies this assay can detect is 250 copies / mL. A negative result does not preclude SARS-CoV-2 infection and should not be used as the sole basis for treatment or other patient management decisions.  A negative result may occur with improper specimen collection / handling, submission of specimen other than nasopharyngeal swab, presence of viral mutation(s) within the areas targeted by this assay, and inadequate number of viral copies (<250 copies / mL). A negative result must be combined with clinical observations, patient history, and epidemiological information.  Fact Sheet for Patients:   roadlaptop.co.za  Fact Sheet for Healthcare Providers: http://kim-miller.com/  This test is not yet approved or  cleared by the [place]  FDA and has been authorized for detection and/or diagnosis of SARS-CoV-2 by FDA under an Emergency Use Authorization (EUA).  This EUA will remain in effect (meaning this test can be used) for the duration of the COVID-19 declaration under Section 564(b)(1) of the Act, 21 U.S.C. section 360bbb-3(b)(1), unless the authorization is terminated or revoked sooner.  Performed at [facility], [address] [place], [place] [address]   Blood Culture ID Panel (Reflexed)     Status: Abnormal   Collection Time: 05/14/23  5:40 PM   Result Value Ref Range Status   Enterococcus faecalis NOT DETECTED NOT DETECTED Final   Enterococcus Faecium NOT DETECTED NOT DETECTED Final   Listeria monocytogenes NOT DETECTED NOT DETECTED Final   Staphylococcus species DETECTED (A) NOT DETECTED Final    Comment: CRITICAL RESULT CALLED TO, READ BACK BY AND VERIFIED WITH: NATHAN BELUE PHARMD @1903  05/15/23 ASW    Staphylococcus aureus (BCID) NOT DETECTED NOT DETECTED Final   Staphylococcus epidermidis DETECTED (A) NOT DETECTED Final    Comment: CRITICAL RESULT CALLED TO, READ BACK BY AND VERIFIED WITH: NATHAN BELUE PHARMD @1903  05/15/23 ASW    Staphylococcus lugdunensis NOT DETECTED NOT DETECTED Final   Streptococcus species NOT DETECTED NOT DETECTED Final   Streptococcus agalactiae NOT DETECTED NOT DETECTED Final   Streptococcus pneumoniae NOT DETECTED NOT DETECTED Final   Streptococcus pyogenes NOT DETECTED NOT DETECTED Final   A.calcoaceticus-baumannii NOT DETECTED NOT DETECTED Final   Bacteroides fragilis NOT DETECTED NOT DETECTED Final   Enterobacterales NOT DETECTED NOT DETECTED Final   Enterobacter cloacae complex NOT DETECTED NOT DETECTED Final   Escherichia coli NOT DETECTED NOT DETECTED Final   Klebsiella aerogenes NOT DETECTED NOT DETECTED Final   Klebsiella oxytoca NOT DETECTED NOT DETECTED Final   Klebsiella pneumoniae NOT DETECTED NOT DETECTED Final   Proteus species NOT DETECTED NOT DETECTED Final   Salmonella species NOT DETECTED NOT DETECTED Final   Serratia marcescens NOT DETECTED NOT DETECTED Final   Haemophilus influenzae NOT DETECTED NOT DETECTED Final   Neisseria meningitidis NOT DETECTED NOT DETECTED Final   Pseudomonas aeruginosa NOT DETECTED NOT DETECTED Final   Stenotrophomonas maltophilia NOT DETECTED NOT DETECTED Final   Candida albicans NOT DETECTED NOT DETECTED Final   Candida auris NOT DETECTED NOT DETECTED Final   Candida glabrata NOT DETECTED NOT DETECTED Final   Candida krusei NOT DETECTED NOT  DETECTED Final   Candida parapsilosis NOT DETECTED NOT DETECTED Final   Candida tropicalis NOT DETECTED NOT DETECTED Final   Cryptococcus neoformans/gattii NOT DETECTED NOT DETECTED Final   Methicillin resistance mecA/C NOT DETECTED NOT DETECTED Final    Comment: Performed at [facility], [address] [place], [place] [address]  Blood culture (routine x 2)     Status: None   Collection Time: 05/14/23  5:45 PM   Specimen: BLOOD  Result Value Ref Range Status   Specimen Description BLOOD BLOOD LEFT HAND  Final   Special Requests   Final    BOTTLES DRAWN AEROBIC AND ANAEROBIC Blood Culture results may not be optimal due to an inadequate volume of blood received in culture bottles   Culture   Final    NO GROWTH 5 DAYS Performed at [facility], [address], [place], [place] [address]    Report Status 05/19/2023 FINAL  Final  MRSA Next Gen by PCR, Nasal     Status: None   Collection Time: 05/15/23  1:53 AM   Specimen: Nasal Mucosa; Nasal Swab  Result Value Ref Range Status   MRSA by PCR Next Gen NOT DETECTED NOT DETECTED Final    Comment: (NOTE) The GeneXpert MRSA Assay (FDA approved for NASAL specimens only), is one component of a comprehensive MRSA colonization surveillance program. It is not intended to diagnose MRSA infection nor to guide or monitor treatment for MRSA infections. Test performance is not FDA approved in patients less than 70 years old. Performed at [facility], [address] [place], [place] [address]   Respiratory (~20 pathogens) panel by PCR     Status: None   Collection Time: 05/15/23  1:53 AM   Specimen: Nasal Mucosa  Result Value Ref Range Status   Adenovirus NOT DETECTED NOT DETECTED Final   Coronavirus 229E NOT DETECTED NOT DETECTED Final    Comment: (NOTE) The Coronavirus on the Respiratory Panel, DOES NOT test for the novel  Coronavirus (2019 nCoV)    Coronavirus HKU1 NOT DETECTED NOT DETECTED Final    Coronavirus NL63 NOT DETECTED NOT DETECTED Final   Coronavirus OC43 NOT DETECTED NOT DETECTED Final   Metapneumovirus NOT DETECTED NOT DETECTED Final   Rhinovirus / Enterovirus NOT DETECTED NOT DETECTED Final   Influenza A NOT DETECTED NOT DETECTED Final   Influenza B NOT DETECTED NOT DETECTED Final   Parainfluenza Virus 1 NOT DETECTED NOT DETECTED Final   Parainfluenza Virus 2 NOT DETECTED NOT DETECTED Final   Parainfluenza Virus 3 NOT DETECTED NOT DETECTED Final   Parainfluenza Virus 4 NOT DETECTED NOT DETECTED Final   Respiratory Syncytial Virus NOT DETECTED NOT DETECTED Final   Bordetella pertussis NOT DETECTED NOT DETECTED Final   Bordetella Parapertussis NOT DETECTED NOT DETECTED Final   Chlamydophila pneumoniae NOT DETECTED NOT DETECTED Final   Mycoplasma pneumoniae NOT DETECTED NOT DETECTED Final    Comment: Performed at [facility], [address] [place], [place] [address]     Total time spend on discharging this patient, including the last patient exam, discussing the hospital stay, instructions for ongoing care as it relates to all pertinent caregivers, as well as preparing the medical discharge records, prescriptions, and/or referrals as applicable, is 30 minutes.    Ellouise Haber, MD  [facility] 05/22/2023, 10:07 AM

## 2023-05-22 NOTE — Plan of Care (Signed)
 IV removed, report called to [facility] facility in [place], Family notified that patient is to be moved and Patient to be transported by EMS

## 2023-05-22 NOTE — TOC Progression Note (Signed)
 Transition of Care [facility]) - Progression Note    Patient Details  Name: Scott Cortez MRN: [id number] Date of Birth: 1933-05-31  [facility] of [facility]) CM/SW Contact  Corean ONEIDA Haddock, RN Phone Number: 05/22/2023, 11:42 AM  Clinical Narrative:      Patient to discharge today Per Annalee at [facility] in [place] they are able to offer a bed  Daughter Suzen accepts  Patient will DC to: [facility] Anticipated DC date:05/22/23  Family notified: Suzen  Transport ab:[id number]  Per MD patient ready for DC to . RN, patient, patient's family, and facility notified of DC. Discharge Summary sent to facility. RN given number for report. DC packet on chart. Ambulance transport requested for patient.  TOC signing off.       Expected Discharge Plan and Services         Expected Discharge Date: 05/22/23                                     Social Determinants of Health (SDOH) Interventions SDOH Screenings   Food Insecurity: No Food Insecurity (05/15/2023)  Housing: Low Risk  (05/15/2023)  Transportation Needs: Unmet Transportation Needs (05/15/2023)  Utilities: Not At Risk (05/15/2023)  Social Connections: Socially Isolated (05/15/2023)  Tobacco Use: Medium Risk (05/14/2023)    Readmission Risk Interventions    05/16/2023    4:05 PM  Readmission Risk Prevention Plan  Transportation Screening Complete  Home Care Screening Complete  Medication Review (RN CM) Complete
